# Patient Record
Sex: Male | Born: 1950
Health system: Southern US, Community
[De-identification: ages and names within clinical notes are randomized; demographics above are authoritative.]

## PROBLEM LIST (undated history)

## (undated) ENCOUNTER — Ambulatory Visit: Admission: EM | Payer: Medicare Other

## (undated) DIAGNOSIS — T7840XA Allergy, unspecified, initial encounter: Secondary | ICD-10-CM

## (undated) DIAGNOSIS — K219 Gastro-esophageal reflux disease without esophagitis: Secondary | ICD-10-CM

## (undated) DIAGNOSIS — I10 Essential (primary) hypertension: Secondary | ICD-10-CM

## (undated) DIAGNOSIS — M51369 Other intervertebral disc degeneration, lumbar region without mention of lumbar back pain or lower extremity pain: Secondary | ICD-10-CM

## (undated) DIAGNOSIS — M5136 Other intervertebral disc degeneration, lumbar region: Secondary | ICD-10-CM

## (undated) DIAGNOSIS — I82409 Acute embolism and thrombosis of unspecified deep veins of unspecified lower extremity: Secondary | ICD-10-CM

## (undated) DIAGNOSIS — I7300 Raynaud's syndrome without gangrene: Secondary | ICD-10-CM

## (undated) DIAGNOSIS — E785 Hyperlipidemia, unspecified: Secondary | ICD-10-CM

## (undated) HISTORY — DX: Acute embolism and thrombosis of unspecified deep veins of unspecified lower extremity: I82.409

## (undated) HISTORY — DX: Gastro-esophageal reflux disease without esophagitis: K21.9

## (undated) HISTORY — DX: Allergy, unspecified, initial encounter: T78.40XA

## (undated) HISTORY — PX: EYE SURGERY: SHX253

## (undated) HISTORY — DX: Hyperlipidemia, unspecified: E78.5

## (undated) HISTORY — DX: Essential (primary) hypertension: I10

---

## 2016-10-11 DIAGNOSIS — J069 Acute upper respiratory infection, unspecified: Secondary | ICD-10-CM | POA: Diagnosis not present

## 2016-10-11 DIAGNOSIS — I10 Essential (primary) hypertension: Secondary | ICD-10-CM | POA: Diagnosis not present

## 2016-10-11 DIAGNOSIS — Z6825 Body mass index (BMI) 25.0-25.9, adult: Secondary | ICD-10-CM | POA: Diagnosis not present

## 2016-10-11 DIAGNOSIS — Z713 Dietary counseling and surveillance: Secondary | ICD-10-CM | POA: Diagnosis not present

## 2017-01-23 DIAGNOSIS — Z1211 Encounter for screening for malignant neoplasm of colon: Secondary | ICD-10-CM | POA: Diagnosis not present

## 2017-01-23 DIAGNOSIS — Z23 Encounter for immunization: Secondary | ICD-10-CM | POA: Diagnosis not present

## 2017-01-23 DIAGNOSIS — M5136 Other intervertebral disc degeneration, lumbar region: Secondary | ICD-10-CM | POA: Diagnosis not present

## 2017-01-23 DIAGNOSIS — I10 Essential (primary) hypertension: Secondary | ICD-10-CM | POA: Diagnosis not present

## 2017-01-23 DIAGNOSIS — Z13220 Encounter for screening for lipoid disorders: Secondary | ICD-10-CM | POA: Diagnosis not present

## 2017-01-30 DIAGNOSIS — Z1211 Encounter for screening for malignant neoplasm of colon: Secondary | ICD-10-CM | POA: Diagnosis not present

## 2017-09-03 DIAGNOSIS — Z23 Encounter for immunization: Secondary | ICD-10-CM | POA: Diagnosis not present

## 2017-11-14 DIAGNOSIS — L989 Disorder of the skin and subcutaneous tissue, unspecified: Secondary | ICD-10-CM | POA: Diagnosis not present

## 2017-11-14 DIAGNOSIS — I10 Essential (primary) hypertension: Secondary | ICD-10-CM | POA: Diagnosis not present

## 2017-12-03 DIAGNOSIS — Z961 Presence of intraocular lens: Secondary | ICD-10-CM | POA: Diagnosis not present

## 2017-12-03 DIAGNOSIS — H26492 Other secondary cataract, left eye: Secondary | ICD-10-CM | POA: Diagnosis not present

## 2017-12-03 DIAGNOSIS — H35362 Drusen (degenerative) of macula, left eye: Secondary | ICD-10-CM | POA: Diagnosis not present

## 2017-12-03 DIAGNOSIS — H35033 Hypertensive retinopathy, bilateral: Secondary | ICD-10-CM | POA: Diagnosis not present

## 2017-12-03 DIAGNOSIS — H26491 Other secondary cataract, right eye: Secondary | ICD-10-CM | POA: Diagnosis not present

## 2018-01-02 DIAGNOSIS — Z85828 Personal history of other malignant neoplasm of skin: Secondary | ICD-10-CM | POA: Diagnosis not present

## 2018-01-02 DIAGNOSIS — C44329 Squamous cell carcinoma of skin of other parts of face: Secondary | ICD-10-CM | POA: Diagnosis not present

## 2018-01-02 DIAGNOSIS — L570 Actinic keratosis: Secondary | ICD-10-CM | POA: Diagnosis not present

## 2018-01-02 DIAGNOSIS — L739 Follicular disorder, unspecified: Secondary | ICD-10-CM | POA: Diagnosis not present

## 2018-01-02 DIAGNOSIS — D485 Neoplasm of uncertain behavior of skin: Secondary | ICD-10-CM | POA: Diagnosis not present

## 2018-01-02 DIAGNOSIS — L821 Other seborrheic keratosis: Secondary | ICD-10-CM | POA: Diagnosis not present

## 2018-02-08 DIAGNOSIS — L570 Actinic keratosis: Secondary | ICD-10-CM | POA: Diagnosis not present

## 2018-02-08 DIAGNOSIS — Z85828 Personal history of other malignant neoplasm of skin: Secondary | ICD-10-CM | POA: Diagnosis not present

## 2018-05-22 DIAGNOSIS — Z0000 Encounter for general adult medical examination without abnormal findings: Secondary | ICD-10-CM | POA: Diagnosis not present

## 2018-05-22 DIAGNOSIS — Z23 Encounter for immunization: Secondary | ICD-10-CM | POA: Diagnosis not present

## 2018-05-22 DIAGNOSIS — I10 Essential (primary) hypertension: Secondary | ICD-10-CM | POA: Diagnosis not present

## 2018-05-22 DIAGNOSIS — E782 Mixed hyperlipidemia: Secondary | ICD-10-CM | POA: Diagnosis not present

## 2018-05-22 DIAGNOSIS — Z1211 Encounter for screening for malignant neoplasm of colon: Secondary | ICD-10-CM | POA: Diagnosis not present

## 2018-05-22 DIAGNOSIS — R945 Abnormal results of liver function studies: Secondary | ICD-10-CM | POA: Diagnosis not present

## 2018-05-28 DIAGNOSIS — Z1211 Encounter for screening for malignant neoplasm of colon: Secondary | ICD-10-CM | POA: Diagnosis not present

## 2018-07-05 DIAGNOSIS — C44319 Basal cell carcinoma of skin of other parts of face: Secondary | ICD-10-CM | POA: Diagnosis not present

## 2018-07-05 DIAGNOSIS — L821 Other seborrheic keratosis: Secondary | ICD-10-CM | POA: Diagnosis not present

## 2018-07-05 DIAGNOSIS — Z85828 Personal history of other malignant neoplasm of skin: Secondary | ICD-10-CM | POA: Diagnosis not present

## 2018-07-05 DIAGNOSIS — D2271 Melanocytic nevi of right lower limb, including hip: Secondary | ICD-10-CM | POA: Diagnosis not present

## 2018-07-05 DIAGNOSIS — D1801 Hemangioma of skin and subcutaneous tissue: Secondary | ICD-10-CM | POA: Diagnosis not present

## 2018-07-05 DIAGNOSIS — D225 Melanocytic nevi of trunk: Secondary | ICD-10-CM | POA: Diagnosis not present

## 2018-07-05 DIAGNOSIS — L570 Actinic keratosis: Secondary | ICD-10-CM | POA: Diagnosis not present

## 2018-08-13 DIAGNOSIS — C44319 Basal cell carcinoma of skin of other parts of face: Secondary | ICD-10-CM | POA: Diagnosis not present

## 2018-08-13 DIAGNOSIS — Z85828 Personal history of other malignant neoplasm of skin: Secondary | ICD-10-CM | POA: Diagnosis not present

## 2018-09-18 DIAGNOSIS — J069 Acute upper respiratory infection, unspecified: Secondary | ICD-10-CM | POA: Diagnosis not present

## 2018-09-18 DIAGNOSIS — R05 Cough: Secondary | ICD-10-CM | POA: Diagnosis not present

## 2018-12-09 DIAGNOSIS — Z961 Presence of intraocular lens: Secondary | ICD-10-CM | POA: Diagnosis not present

## 2018-12-09 DIAGNOSIS — H26492 Other secondary cataract, left eye: Secondary | ICD-10-CM | POA: Diagnosis not present

## 2018-12-09 DIAGNOSIS — H35033 Hypertensive retinopathy, bilateral: Secondary | ICD-10-CM | POA: Diagnosis not present

## 2018-12-09 DIAGNOSIS — H26491 Other secondary cataract, right eye: Secondary | ICD-10-CM | POA: Diagnosis not present

## 2018-12-30 DIAGNOSIS — H26492 Other secondary cataract, left eye: Secondary | ICD-10-CM | POA: Diagnosis not present

## 2019-01-10 DIAGNOSIS — Z85828 Personal history of other malignant neoplasm of skin: Secondary | ICD-10-CM | POA: Diagnosis not present

## 2019-01-10 DIAGNOSIS — D225 Melanocytic nevi of trunk: Secondary | ICD-10-CM | POA: Diagnosis not present

## 2019-01-10 DIAGNOSIS — L814 Other melanin hyperpigmentation: Secondary | ICD-10-CM | POA: Diagnosis not present

## 2019-01-10 DIAGNOSIS — L570 Actinic keratosis: Secondary | ICD-10-CM | POA: Diagnosis not present

## 2019-01-10 DIAGNOSIS — D1801 Hemangioma of skin and subcutaneous tissue: Secondary | ICD-10-CM | POA: Diagnosis not present

## 2019-01-10 DIAGNOSIS — L821 Other seborrheic keratosis: Secondary | ICD-10-CM | POA: Diagnosis not present

## 2019-01-10 DIAGNOSIS — C44619 Basal cell carcinoma of skin of left upper limb, including shoulder: Secondary | ICD-10-CM | POA: Diagnosis not present

## 2019-01-13 DIAGNOSIS — H26491 Other secondary cataract, right eye: Secondary | ICD-10-CM | POA: Diagnosis not present

## 2019-01-14 DIAGNOSIS — J069 Acute upper respiratory infection, unspecified: Secondary | ICD-10-CM | POA: Diagnosis not present

## 2019-01-14 DIAGNOSIS — W19XXXA Unspecified fall, initial encounter: Secondary | ICD-10-CM | POA: Diagnosis not present

## 2019-01-14 DIAGNOSIS — S20211A Contusion of right front wall of thorax, initial encounter: Secondary | ICD-10-CM | POA: Diagnosis not present

## 2019-07-10 DIAGNOSIS — D225 Melanocytic nevi of trunk: Secondary | ICD-10-CM | POA: Diagnosis not present

## 2019-07-10 DIAGNOSIS — L814 Other melanin hyperpigmentation: Secondary | ICD-10-CM | POA: Diagnosis not present

## 2019-07-10 DIAGNOSIS — L821 Other seborrheic keratosis: Secondary | ICD-10-CM | POA: Diagnosis not present

## 2019-07-10 DIAGNOSIS — L570 Actinic keratosis: Secondary | ICD-10-CM | POA: Diagnosis not present

## 2019-07-10 DIAGNOSIS — Z85828 Personal history of other malignant neoplasm of skin: Secondary | ICD-10-CM | POA: Diagnosis not present

## 2019-07-10 DIAGNOSIS — D1801 Hemangioma of skin and subcutaneous tissue: Secondary | ICD-10-CM | POA: Diagnosis not present

## 2019-07-17 DIAGNOSIS — Z961 Presence of intraocular lens: Secondary | ICD-10-CM | POA: Diagnosis not present

## 2019-07-17 DIAGNOSIS — H04123 Dry eye syndrome of bilateral lacrimal glands: Secondary | ICD-10-CM | POA: Diagnosis not present

## 2019-07-17 DIAGNOSIS — H35033 Hypertensive retinopathy, bilateral: Secondary | ICD-10-CM | POA: Diagnosis not present

## 2019-07-17 DIAGNOSIS — H35362 Drusen (degenerative) of macula, left eye: Secondary | ICD-10-CM | POA: Diagnosis not present

## 2019-07-17 DIAGNOSIS — H43811 Vitreous degeneration, right eye: Secondary | ICD-10-CM | POA: Diagnosis not present

## 2019-08-08 DIAGNOSIS — H35033 Hypertensive retinopathy, bilateral: Secondary | ICD-10-CM | POA: Diagnosis not present

## 2019-08-08 DIAGNOSIS — I10 Essential (primary) hypertension: Secondary | ICD-10-CM | POA: Diagnosis not present

## 2019-08-08 DIAGNOSIS — E782 Mixed hyperlipidemia: Secondary | ICD-10-CM | POA: Diagnosis not present

## 2019-08-18 DIAGNOSIS — I10 Essential (primary) hypertension: Secondary | ICD-10-CM | POA: Diagnosis not present

## 2019-08-18 DIAGNOSIS — Z23 Encounter for immunization: Secondary | ICD-10-CM | POA: Diagnosis not present

## 2019-08-18 DIAGNOSIS — E782 Mixed hyperlipidemia: Secondary | ICD-10-CM | POA: Diagnosis not present

## 2019-12-21 ENCOUNTER — Ambulatory Visit: Payer: Self-pay

## 2020-01-09 ENCOUNTER — Ambulatory Visit: Payer: Self-pay

## 2020-01-13 DIAGNOSIS — Z85828 Personal history of other malignant neoplasm of skin: Secondary | ICD-10-CM | POA: Diagnosis not present

## 2020-01-13 DIAGNOSIS — D225 Melanocytic nevi of trunk: Secondary | ICD-10-CM | POA: Diagnosis not present

## 2020-01-13 DIAGNOSIS — D2261 Melanocytic nevi of right upper limb, including shoulder: Secondary | ICD-10-CM | POA: Diagnosis not present

## 2020-01-13 DIAGNOSIS — C44319 Basal cell carcinoma of skin of other parts of face: Secondary | ICD-10-CM | POA: Diagnosis not present

## 2020-01-13 DIAGNOSIS — L814 Other melanin hyperpigmentation: Secondary | ICD-10-CM | POA: Diagnosis not present

## 2020-01-13 DIAGNOSIS — D1801 Hemangioma of skin and subcutaneous tissue: Secondary | ICD-10-CM | POA: Diagnosis not present

## 2020-01-13 DIAGNOSIS — L570 Actinic keratosis: Secondary | ICD-10-CM | POA: Diagnosis not present

## 2020-01-13 DIAGNOSIS — D0439 Carcinoma in situ of skin of other parts of face: Secondary | ICD-10-CM | POA: Diagnosis not present

## 2020-01-13 DIAGNOSIS — L821 Other seborrheic keratosis: Secondary | ICD-10-CM | POA: Diagnosis not present

## 2020-07-02 DIAGNOSIS — R630 Anorexia: Secondary | ICD-10-CM | POA: Diagnosis not present

## 2020-07-02 DIAGNOSIS — R5383 Other fatigue: Secondary | ICD-10-CM | POA: Diagnosis not present

## 2020-07-02 DIAGNOSIS — M545 Low back pain: Secondary | ICD-10-CM | POA: Diagnosis not present

## 2020-07-02 DIAGNOSIS — I10 Essential (primary) hypertension: Secondary | ICD-10-CM | POA: Diagnosis not present

## 2020-07-02 DIAGNOSIS — G8929 Other chronic pain: Secondary | ICD-10-CM | POA: Diagnosis not present

## 2020-07-02 DIAGNOSIS — N400 Enlarged prostate without lower urinary tract symptoms: Secondary | ICD-10-CM | POA: Diagnosis not present

## 2020-07-02 DIAGNOSIS — R6882 Decreased libido: Secondary | ICD-10-CM | POA: Diagnosis not present

## 2020-07-12 DIAGNOSIS — R5383 Other fatigue: Secondary | ICD-10-CM | POA: Diagnosis not present

## 2020-07-12 DIAGNOSIS — N400 Enlarged prostate without lower urinary tract symptoms: Secondary | ICD-10-CM | POA: Diagnosis not present

## 2020-07-12 DIAGNOSIS — R6882 Decreased libido: Secondary | ICD-10-CM | POA: Diagnosis not present

## 2020-07-12 DIAGNOSIS — G8929 Other chronic pain: Secondary | ICD-10-CM | POA: Diagnosis not present

## 2020-07-12 DIAGNOSIS — R630 Anorexia: Secondary | ICD-10-CM | POA: Diagnosis not present

## 2020-07-12 DIAGNOSIS — I10 Essential (primary) hypertension: Secondary | ICD-10-CM | POA: Diagnosis not present

## 2020-07-12 DIAGNOSIS — M545 Low back pain: Secondary | ICD-10-CM | POA: Diagnosis not present

## 2020-07-15 DIAGNOSIS — L814 Other melanin hyperpigmentation: Secondary | ICD-10-CM | POA: Diagnosis not present

## 2020-07-15 DIAGNOSIS — L821 Other seborrheic keratosis: Secondary | ICD-10-CM | POA: Diagnosis not present

## 2020-07-15 DIAGNOSIS — D225 Melanocytic nevi of trunk: Secondary | ICD-10-CM | POA: Diagnosis not present

## 2020-07-15 DIAGNOSIS — L905 Scar conditions and fibrosis of skin: Secondary | ICD-10-CM | POA: Diagnosis not present

## 2020-07-15 DIAGNOSIS — D1801 Hemangioma of skin and subcutaneous tissue: Secondary | ICD-10-CM | POA: Diagnosis not present

## 2020-07-15 DIAGNOSIS — L570 Actinic keratosis: Secondary | ICD-10-CM | POA: Diagnosis not present

## 2020-07-15 DIAGNOSIS — Z85828 Personal history of other malignant neoplasm of skin: Secondary | ICD-10-CM | POA: Diagnosis not present

## 2020-07-15 DIAGNOSIS — C44519 Basal cell carcinoma of skin of other part of trunk: Secondary | ICD-10-CM | POA: Diagnosis not present

## 2020-07-26 DIAGNOSIS — H35033 Hypertensive retinopathy, bilateral: Secondary | ICD-10-CM | POA: Diagnosis not present

## 2020-07-26 DIAGNOSIS — Z961 Presence of intraocular lens: Secondary | ICD-10-CM | POA: Diagnosis not present

## 2020-07-26 DIAGNOSIS — H35362 Drusen (degenerative) of macula, left eye: Secondary | ICD-10-CM | POA: Diagnosis not present

## 2020-07-26 DIAGNOSIS — H11823 Conjunctivochalasis, bilateral: Secondary | ICD-10-CM | POA: Diagnosis not present

## 2020-07-26 DIAGNOSIS — H04123 Dry eye syndrome of bilateral lacrimal glands: Secondary | ICD-10-CM | POA: Diagnosis not present

## 2020-07-27 ENCOUNTER — Other Ambulatory Visit: Payer: Self-pay

## 2020-07-27 ENCOUNTER — Ambulatory Visit: Payer: Medicare Other | Attending: Family Medicine

## 2020-07-27 DIAGNOSIS — M545 Low back pain: Secondary | ICD-10-CM | POA: Diagnosis not present

## 2020-07-27 DIAGNOSIS — M6281 Muscle weakness (generalized): Secondary | ICD-10-CM | POA: Diagnosis not present

## 2020-07-27 DIAGNOSIS — R293 Abnormal posture: Secondary | ICD-10-CM | POA: Diagnosis not present

## 2020-07-27 DIAGNOSIS — M5136 Other intervertebral disc degeneration, lumbar region: Secondary | ICD-10-CM | POA: Diagnosis not present

## 2020-07-27 DIAGNOSIS — G8929 Other chronic pain: Secondary | ICD-10-CM | POA: Insufficient documentation

## 2020-07-27 NOTE — Therapy (Signed)
[address] [place], [place], [address] Phone: [phone]   Fax:  [phone]  Physical Therapy Evaluation  Patient Details  Name: Raymond Dismuke Sr. MRN: [id number] Date of Birth: 13-Mar-1951 Referring Provider (PT): [place] Lions, FNP   Encounter Date: 07/27/2020   PT End of Session - 07/27/20 1748    Visit Number 1    Number of Visits 12    Date for PT Re-Evaluation 09/07/20    Authorization Type Medicare    Progress Note Due on Visit 10    PT Start Time 1532    PT Stop Time 1617    PT Time Calculation (min) 45 min    Activity Tolerance Patient tolerated treatment well    Behavior During Therapy [facility] for tasks assessed/performed           History reviewed. No pertinent past medical history.  History reviewed. No pertinent surgical history.  There were no vitals filed for this visit.    Subjective Assessment - 07/27/20 1536    Subjective Pt reports he has had low back pain since he was 36 or 37. He was diagnosed with DDD in [place] and was referred to PT at the time. 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 He notices when he is playing tennis and after, as well as picking up stuff he has pain in low back like a band across his back. "It is always tight though." He was recently stuck in traffic for about 90 minutes and started having pains going down at least his left leg posteriorly all the way down.    Pertinent History DDD    Limitations Other (comment);Walking;Lifting   walking downhill   How long can you sit comfortably? as long as he wants    How long can you stand comfortably? a few minutes    How long can you walk comfortably? hardly ever bothers him    Diagnostic tests XR - curvature of spine, multiple level degenerative disc disease    Patient Stated Goals Get stronger, know what exercises to do    Currently in Pain? Yes     Pain Score 4    gets up to a 7/10   Pain Location Back    Pain Orientation Lower    Pain Descriptors / Indicators Other (Comment)   stiffness   Pain Type Chronic pain    Pain Radiating Towards occasionally posterior left leg    Pain Onset More than a month ago    Pain Frequency Intermittent    Aggravating Factors  twisting, extending, prolonged bending    Pain Relieving Factors Doterra Deep Blue, Aleve, heating pad, swimming              [facility] PT Assessment - 07/27/20 0001      Assessment   Medical Diagnosis Low Back Pain    Referring Provider (PT) [place] Lions, FNP    Onset Date/Surgical Date --   unknown   Hand Dominance Left    Next MD Visit ~1 month    Prior Therapy Yes      Balance Screen   Has the patient fallen in the past 6 months Yes    How many times? 2   on tennis court, changing direction   Has the patient had a decrease in activity level because of a fear of falling?  Yes  Is the patient reluctant to leave their home because of a fear of falling?  No      Prior Function   Level of Independence Independent    Vocation Retired    Teacher, music some tennis lessons    Leisure Tennis 3-4x/week, yard work, hiking      ROM / Strength   AROM / PROM / Strength AROM;Strength      AROM   Overall AROM Comments limited by 25-50% all motions with pain    AROM Assessment Site Lumbar      Strength   Strength Assessment Site Hip;Knee    Right/Left Hip Right;Left    Right Hip Flexion 4/5    Right Hip External Rotation  4-/5    Right Hip ABduction 4-/5    Left Hip Flexion 4+/5    Left Hip External Rotation 4-/5    Left Hip ABduction 4-/5    Right/Left Knee Right;Left    Right Knee Flexion 4/5    Right Knee Extension 4/5    Left Knee Flexion 4+/5    Left Knee Extension 4+/5      Special Tests    Special Tests Lumbar    Lumbar Tests Slump Test      Slump test   Findings Negative                      Objective measurements  completed on examination: See above findings.               PT Education - 07/27/20 1748    Education Details POC, HEP, diagnosis, prognosis    Person(s) Educated Patient    Methods Explanation;Demonstration;Tactile cues;Verbal cues;Handout    Comprehension Verbal cues required;Returned demonstration;Verbalized understanding;Tactile cues required            PT Short Term Goals - 07/27/20 1752      PT SHORT TERM GOAL #1   Title Pt will be I and compliant with initial HEP.    Time 3    Status New    Target Date 08/17/20             PT Long Term Goals - 07/27/20 1753      PT LONG TERM GOAL #1   Title Pt will be I and compliant with long term HEP for maintenance.    Time 6    Period Weeks    Status New    Target Date 09/07/20      PT LONG TERM GOAL #2   Title Pt will demo pain free lumbar AROM with no pain.    Time 6    Period Weeks    Status New    Target Date 09/07/20      PT LONG TERM GOAL #3   Title Pt will demo R symmetrical to L LE strength.    Time 6    Period Weeks    Status New    Target Date 09/07/20      PT LONG TERM GOAL #4   Title Pt will report ability to stand for longer periods of time, in order to run errands without pain.    Time 6    Period Weeks    Status New    Target Date 09/07/20                  Plan - 07/27/20 1749    Clinical Impression Statement Pt is a 69 yo male who presents with chronic  low back pain and DDD with mobility and strength deficits. Pt demo decreased AROM in all planes with pain greatest into extension and side bending, strength, mild forward flexed posture. Pt was edu on diagnosis, prognosis, posture, POC, and HEP verbalizing understanding and consent to tx. He would benefit from skilled PT 2x/week for 6 weeks to address impairments and reduce pain.    Personal Factors and Comorbidities Age;Past/Current Experience;Time since onset of injury/illness/exacerbation;Profession    Examination-Activity  Limitations Lift;Bend;Stand    Examination-Participation Restrictions Yard Work;Volunteer;Community Activity;Interpersonal Relationship    Stability/Clinical Decision Making Stable/Uncomplicated    Clinical Decision Making Low    Rehab Potential Good    PT Frequency 2x / week    PT Duration 6 weeks    PT Treatment/Interventions ADLs/Self Care Home Management;Traction;Spinal Manipulations;Joint Manipulations;Moist Heat;Iontophoresis 4mg /ml Dexamethasone;Electrical Stimulation;Cryotherapy;Therapeutic activities;Functional mobility training;Therapeutic exercise;Balance training;Neuromuscular re-education;Patient/family education;Manual techniques;Dry needling    PT Next Visit Plan FOTO, Assess response to HEP, add more flexibility and core strengthening    PT Home Exercise Plan see pt instructions    Consulted and Agree with Plan of Care Patient           Patient will benefit from skilled therapeutic intervention in order to improve the following deficits and impairments:  Decreased activity tolerance, Decreased strength, Increased fascial restricitons, Impaired flexibility, Postural dysfunction, Pain, Improper body mechanics, Impaired perceived functional ability, Decreased mobility  Visit Diagnosis: Chronic bilateral low back pain, unspecified whether sciatica present - Plan: PT plan of care cert/re-cert  Degenerative disc disease, lumbar - Plan: PT plan of care cert/re-cert  Abnormal posture - Plan: PT plan of care cert/re-cert  Muscle weakness (generalized) - Plan: PT plan of care cert/re-cert     Problem List There are no problems to display for this patient.   Izell Clymer, PT, DPT 07/27/2020, 5:57 PM  [address] [place], [place], [address] Phone: [phone]   Fax:  [phone]  Name: Raymond Haberman Sr. MRN: [id number] Date of Birth: Mar 29, 1951

## 2020-09-08 DIAGNOSIS — E782 Mixed hyperlipidemia: Secondary | ICD-10-CM | POA: Diagnosis not present

## 2020-09-08 DIAGNOSIS — I10 Essential (primary) hypertension: Secondary | ICD-10-CM | POA: Diagnosis not present

## 2020-09-27 ENCOUNTER — Ambulatory Visit: Payer: Self-pay

## 2020-09-27 ENCOUNTER — Ambulatory Visit (INDEPENDENT_AMBULATORY_CARE_PROVIDER_SITE_OTHER): Payer: Medicare Other | Admitting: Family Medicine

## 2020-09-27 ENCOUNTER — Encounter: Payer: Self-pay | Admitting: Family Medicine

## 2020-09-27 ENCOUNTER — Ambulatory Visit: Payer: Medicare Other

## 2020-09-27 ENCOUNTER — Ambulatory Visit (INDEPENDENT_AMBULATORY_CARE_PROVIDER_SITE_OTHER): Payer: Medicare Other

## 2020-09-27 ENCOUNTER — Other Ambulatory Visit: Payer: Self-pay

## 2020-09-27 VITALS — BP 124/82 | HR 67 | Ht 72.0 in | Wt 202.0 lb

## 2020-09-27 DIAGNOSIS — M5136 Other intervertebral disc degeneration, lumbar region: Secondary | ICD-10-CM | POA: Diagnosis not present

## 2020-09-27 DIAGNOSIS — G8929 Other chronic pain: Secondary | ICD-10-CM

## 2020-09-27 DIAGNOSIS — M545 Low back pain, unspecified: Secondary | ICD-10-CM

## 2020-09-27 DIAGNOSIS — M25562 Pain in left knee: Secondary | ICD-10-CM

## 2020-09-27 DIAGNOSIS — M1712 Unilateral primary osteoarthritis, left knee: Secondary | ICD-10-CM | POA: Insufficient documentation

## 2020-09-27 IMAGING — DX DG KNEE STANDING AP BILAT
1 series · 1 of 1 positions shown · non-contrast
Comparison: None.

CLINICAL DATA: Bilateral knee pain. Acute left knee pain for last 6
weeks.

EXAM:
BILATERAL KNEES STANDING - 1 VIEW

[knee ap]
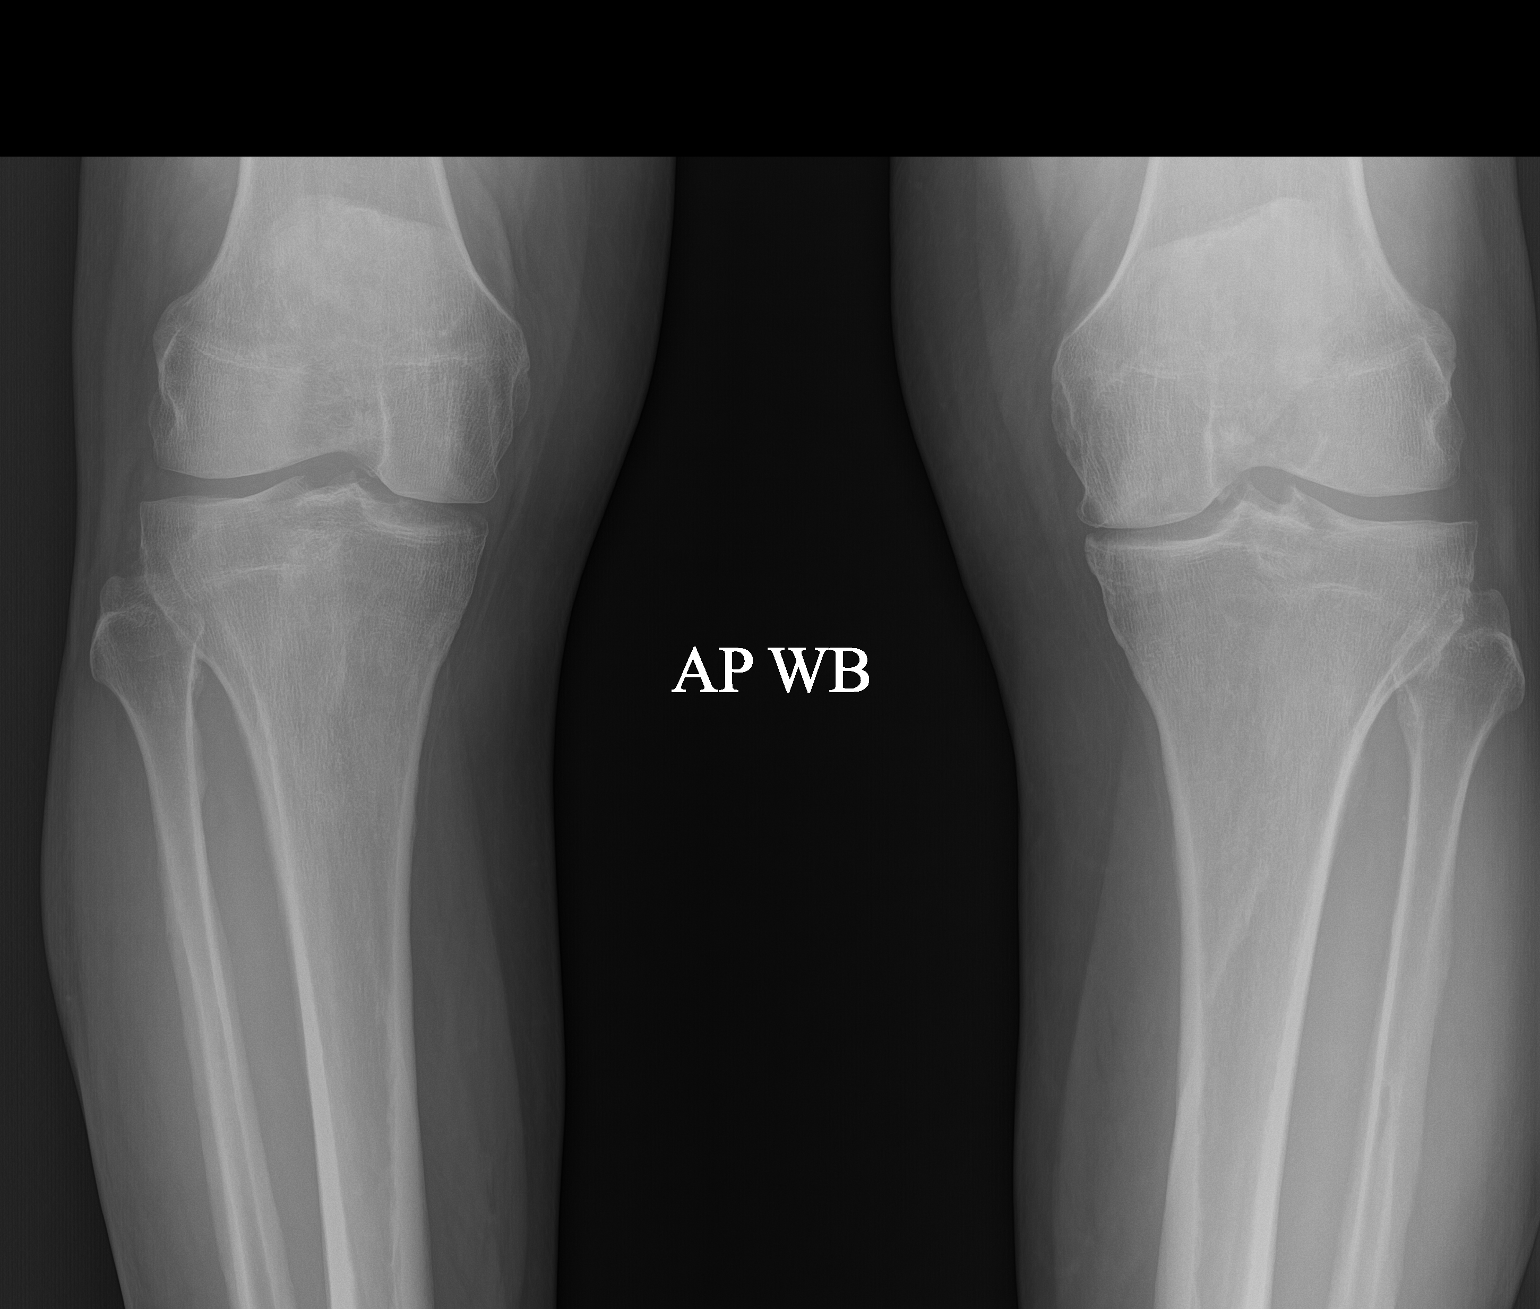

[1 of 1 positions shown; findings below may reference images not displayed]

FINDINGS: AP image of the knees standing demonstrates mild degenerative change
involving the medial compartment of the left knee with some loss of
joint space. No acute or healing fractures are present.
IMPRESSION: Mild degenerative change involving the medial compartment of the
left knee.

## 2020-09-27 IMAGING — DX DG LUMBAR SPINE 2-3V
3 series · 3 of 3 positions shown · non-contrast
Comparison: [DATE]

CLINICAL DATA: Low back pain.  No trauma history submitted.

EXAM:
LUMBAR SPINE - 2-3 VIEW

[l-spine ap]
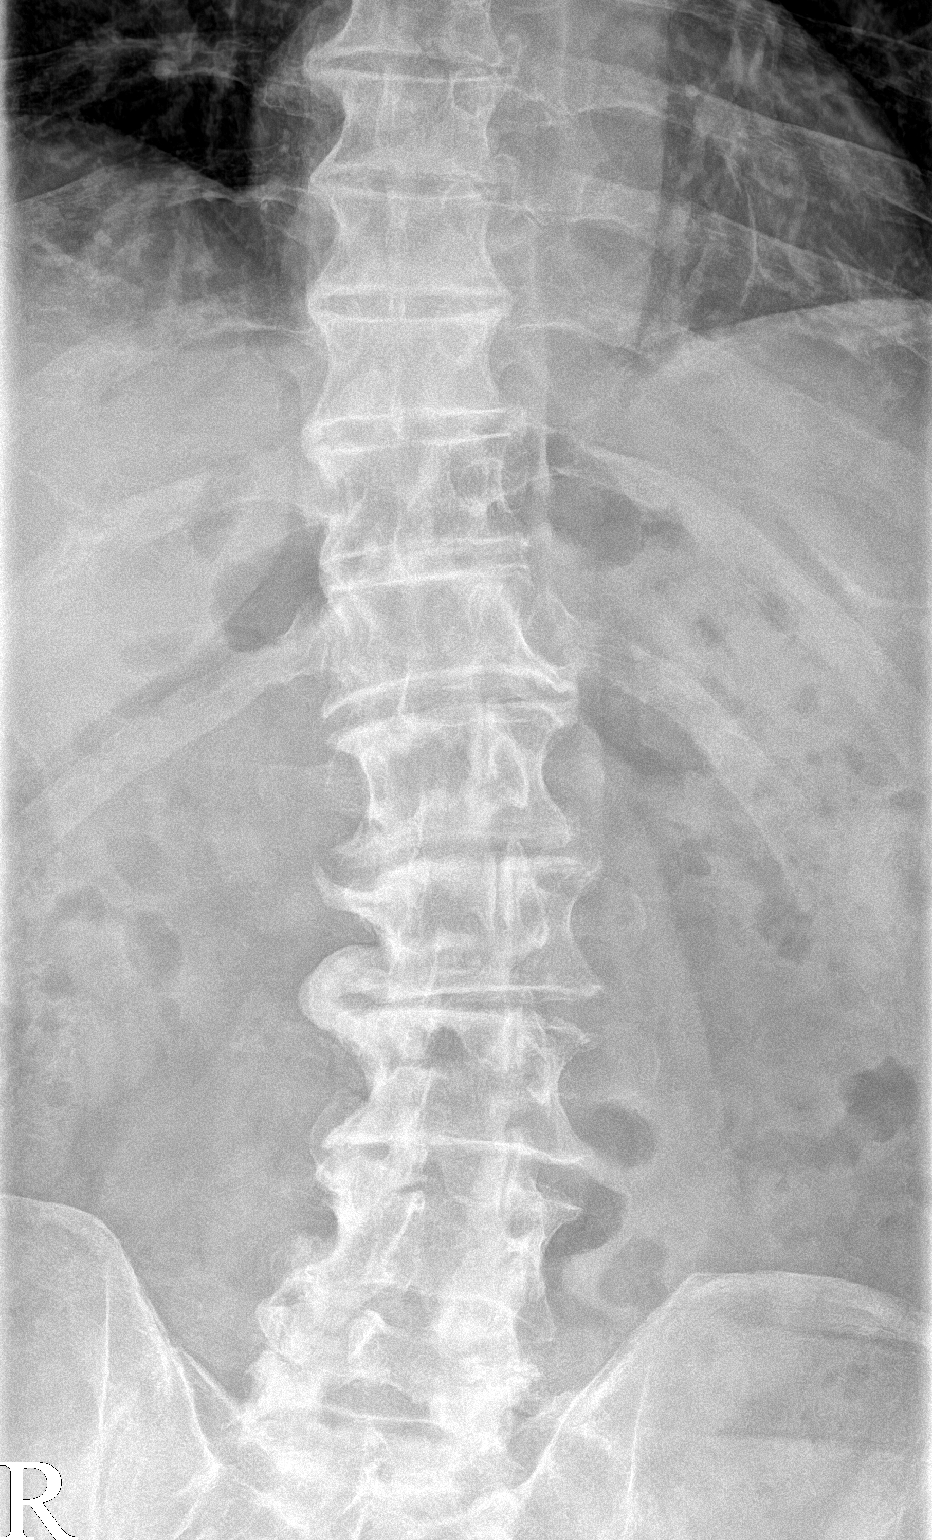

[l-spine lateral (1 of 2)]
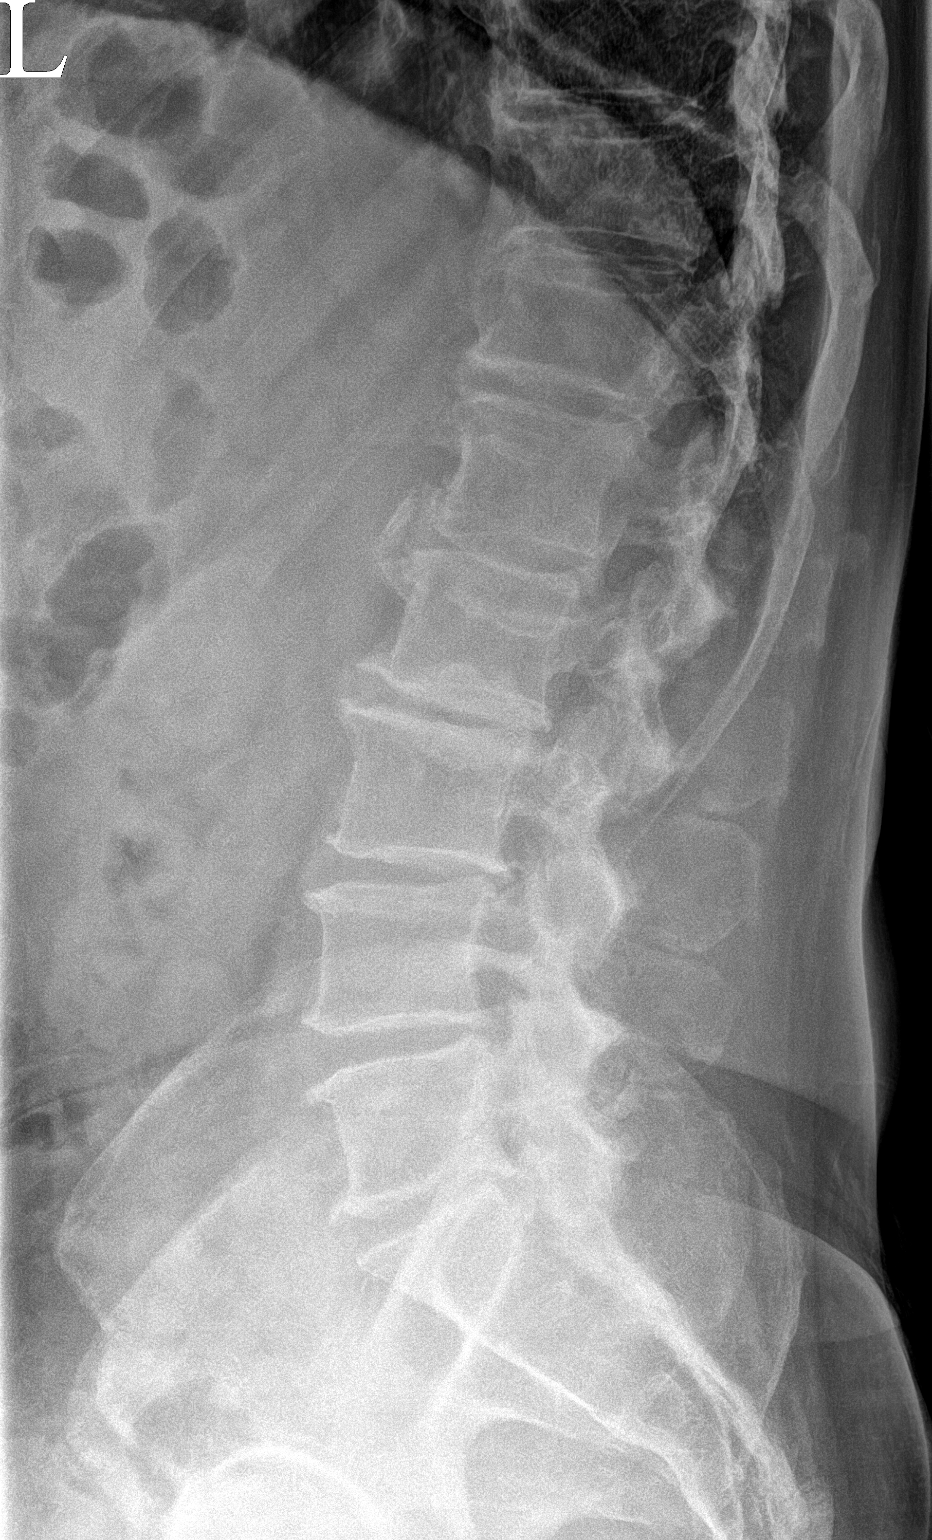

[l-spine lateral (2 of 2)]
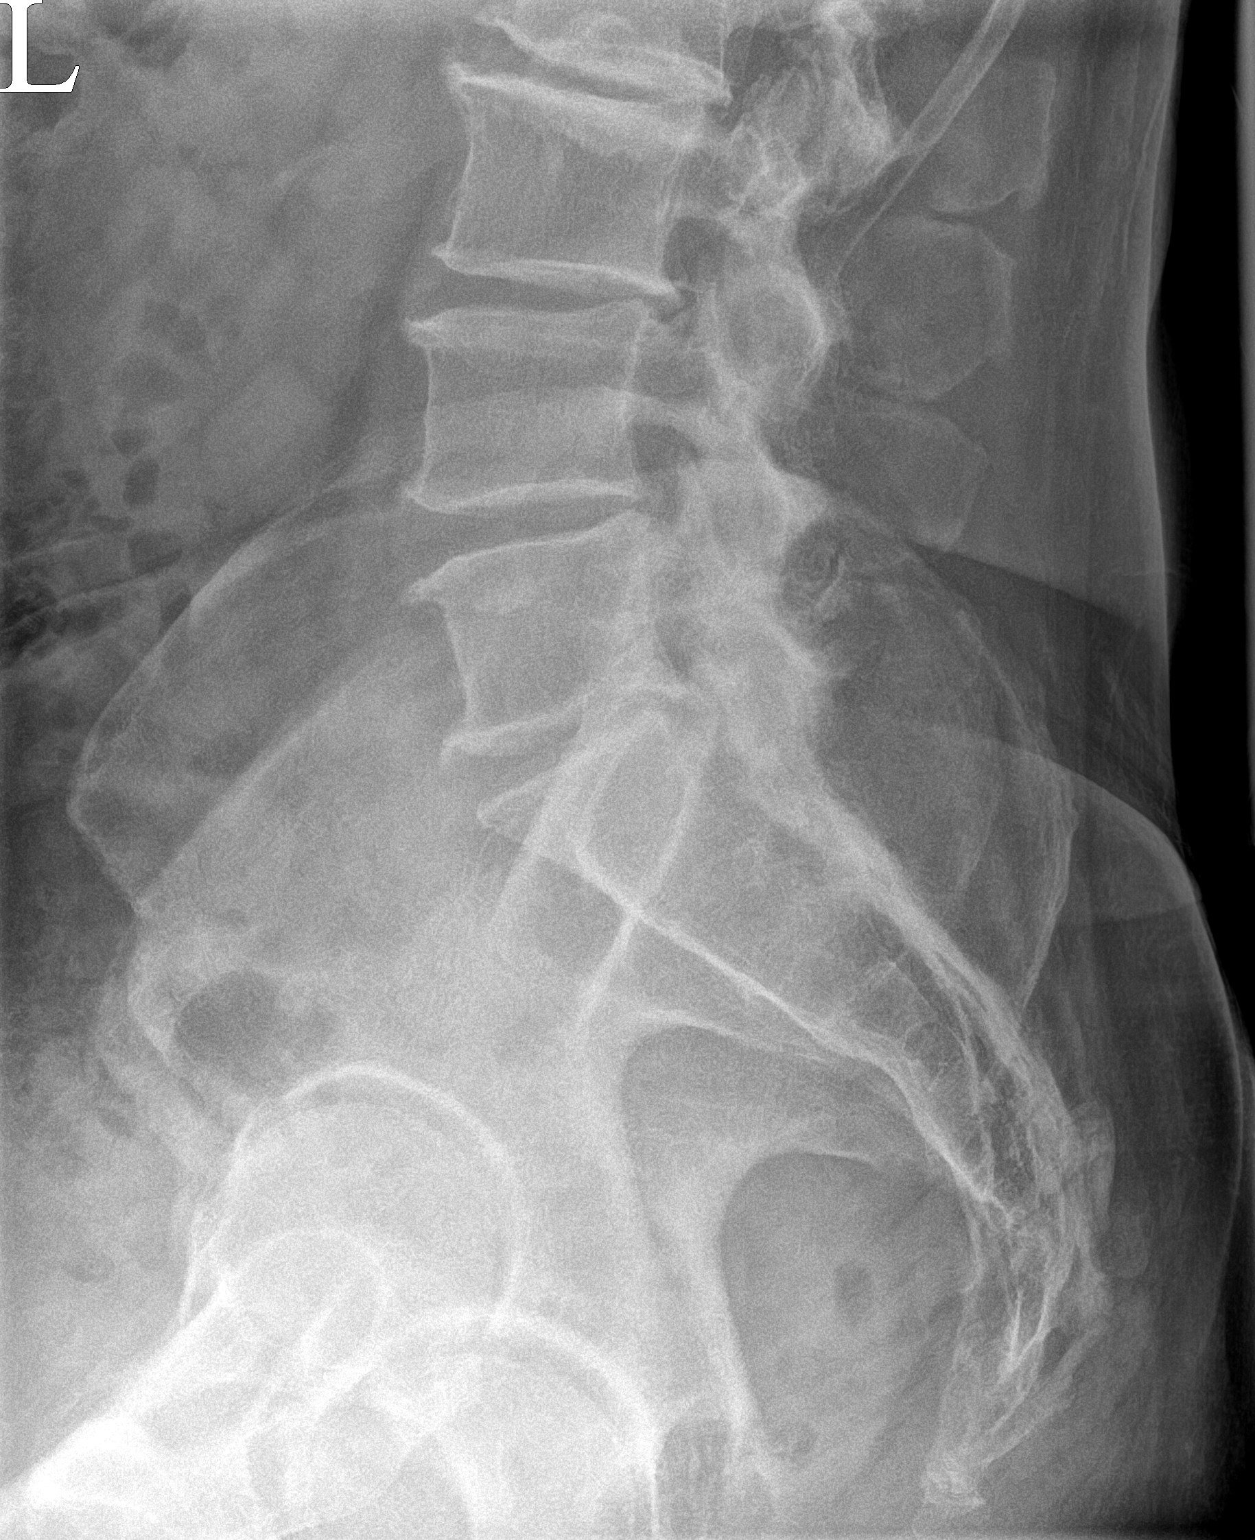

[3 of 3 positions shown; findings below may reference images not displayed]

FINDINGS: Five lumbar type vertebral bodies. Mild convex left lumbar spine
curvature. Maintenance of vertebral body height and alignment. Loss
of intervertebral disc height at L2-3 and L5-S1. Facet arthropathy
at multiple levels throughout the lumbosacral spine.
IMPRESSION: Spondylosis, without acute osseous abnormality.

## 2020-09-27 NOTE — Patient Instructions (Signed)
Xray today Custom knee brace-Ryan will call you Ice 20 minutes 2 times daily. Usually after activity and before bed. Exercises 3 times a week.  Turmeric 500mg  daily  Tart cherry extract 1200mg  at night Vitamin D 2000 IU daily  See me again in 6-8 weeks

## 2020-09-27 NOTE — Assessment & Plan Note (Signed)
Left knee.  Ultrasound shows moderate to severe medial compartment and patellofemoral.  Some instability noted with some mild abnormal thigh to calf ratio.  Due to this I do feel patient would be a candidate for custom brace.  Patient wants to hold on any type of replacement as long as possible.  Held on any type of injection but will consider that at follow-up if we continue to have trouble.  Follow-up again 4 to 8 weeks.

## 2020-09-27 NOTE — Progress Notes (Signed)
[place] Phone: [phone] Subjective:   Raymond Raymond [name], am serving as a scribe for Dr. Hulan Saas. This visit occurred during the SARS-CoV-2 public health emergency.  Safety protocols were in place, including screening questions prior to the visit, additional usage of staff PPE, and extensive cleaning of exam room while observing appropriate contact time as indicated for disinfecting solutions.   I'm seeing this patient by the request  of:  Patient, Raymond [name] Pcp Per  CC: back and knee pain   SLH:[id number]  Raymond Santaana Sr. is a 69 y.o. male coming in with complaint of back and knee pain. Patient states that he has back pain since late 1980s. Patient has been diagnosed with DDD of lumbar spine. Has tried physical therapy. Patient is Passenger transport manager. Pain is present mostly after activity but sometimes during activity. Stiffness is worst in mornings. Does have intermittent radiating pain down the left leg.   Patient is having acute pain in left knee for about 6 weeks. Does not history of arthritis. Pain over lateral aspect. Is able to play tennis and hike despite pain.   History of Osgood Schlatter in right knee.        Raymond [name] past medical history on file. Raymond [name] past surgical history on file. Social History   Socioeconomic History  . Marital status: Married    Spouse name: Not on file  . Number of children: Not on file  . Years of education: Not on file  . Highest education level: Not on file  Occupational History  . Not on file  Tobacco Use  . Smoking status: Not on file  Substance and Sexual Activity  . Alcohol use: Not on file  . Drug use: Not on file  . Sexual activity: Not on file  Other Topics Concern  . Not on file  Social History Narrative  . Not on file   Social Determinants of Health   Financial Resource Strain:   . Difficulty of Paying Living Expenses: Not on file  Food  Insecurity:   . Worried About Charity fundraiser in the Last Year: Not on file  . Ran Out of Food in the Last Year: Not on file  Transportation Needs:   . Lack of Transportation (Medical): Not on file  . Lack of Transportation (Non-Medical): Not on file  Physical Activity:   . Days of Exercise per Week: Not on file  . Minutes of Exercise per Session: Not on file  Stress:   . Feeling of Stress : Not on file  Social Connections:   . Frequency of Communication with Friends and Family: Not on file  . Frequency of Social Gatherings with Friends and Family: Not on file  . Attends Religious Services: Not on file  . Active Member of Clubs or Organizations: Not on file  . Attends Archivist Meetings: Not on file  . Marital Status: Not on file   Not on File Raymond [name] family history on file.   Current Outpatient Medications (Cardiovascular):  .  atorvastatin (LIPITOR) 10 MG tablet, Take 10 mg by mouth daily. .  hydrochlorothiazide (MICROZIDE) 12.5 MG capsule, Take 12.5 mg by mouth daily. Marland Kitchen  losartan (COZAAR) 50 MG tablet, Take 50 mg by mouth daily.     Current Outpatient Medications (Other):  Marland Kitchen  TURMERIC PO, Take by mouth.   Reviewed prior external information including notes and imaging from  primary care provider  As well as notes that were available from care everywhere and other healthcare systems.  Past medical history, social, surgical and family history all reviewed in electronic medical record.  Raymond [name] pertanent information unless stated regarding to the chief complaint.   Review of Systems:  Raymond [name] headache, visual changes, nausea, vomiting, diarrhea, constipation, dizziness, abdominal pain, skin rash, fevers, chills, night sweats, weight loss, swollen lymph nodes, body aches, joint swelling, chest pain, shortness of breath, mood changes. POSITIVE muscle aches  Objective  Blood pressure 124/82, pulse 67, height 6' (1.829 m), weight 202 lb (91.6 kg), SpO2 95 %.   General: Raymond [name]  apparent distress alert and oriented x3 mood and affect normal, dressed appropriately.  HEENT: Pupils equal, extraocular movements intact  Respiratory: Patient's speak in full sentences and does not appear short of breath  Cardiovascular: Raymond [name] lower extremity edema, non tender, Raymond [name] erythema  MSK: Low back exam shows significant loss of lordosis.  Patient does have some very mild degenerative scoliosis noted.  Patient does have significant tightness of the left hip flexor compared to the contralateral side.  Patient does have some limited range of motion of the left hip but worse with external rotation.  Knee: Left valgus deformity noted.  Abnormal thigh to calf ratio.  Tender to palpation over medial and PF joint line.  ROM full in flexion and extension and lower leg rotation. instability with valgus force.  painful patellar compression. Patellar glide with moderate crepitus. Patellar and quadriceps tendons unremarkable. Hamstring and quadriceps strength is normal.  Limited musculoskeletal ultrasound was performed and interpreted by Lyndal Pulley  Limited ultrasound of patient's left knee shows that patient does have some arthritic changes noted mostly with narrowing of the patellofemoral and medial joint space.  Degenerative medial meniscus noted laterally.  Raymond [name] cyst noted.  Trace effusion of the joint noted.   Impression: Knee arthritis with small effusion    [id number]; 15 additional minutes spent for Therapeutic exercises as stated in above notes.  This included exercises focusing on stretching, strengthening, with significant focus on eccentric aspects.   Long term goals include an improvement in range of motion, strength, endurance as well as avoiding reinjury. Patient's frequency would include in 1-2 times a day, 3-5 times a week for a duration of 6-12 weeks. Low back exercises that included:  Pelvic tilt/bracing instruction to focus on control of the pelvic girdle and lower abdominal muscles   Glute strengthening exercises, focusing on proper firing of the glutes without engaging the low back muscles Proper stretching techniques for maximum relief for the hamstrings, hip flexors, low back and some rotation where tolerated   Proper technique shown and discussed handout in great detail with ATC.  All questions were discussed and answered.     Impression and Recommendations:     The above documentation has been reviewed and is accurate and complete Lyndal Pulley, DO

## 2020-09-27 NOTE — Assessment & Plan Note (Signed)
Degenerative disc in the lumbar likely.  We will get x-rays to further evaluate.  I do believe that this is contributing to more of the discomfort and pain.  Patient would like to try a conservative approach.  Discussed over-the-counter medications, home exercises given.  Patient wanted to consider the possibility of formal physical therapy but has done that fairly recently.  Patient will follow up again in 4 to 8 weeks.  If continuing to have trouble I would encourage patient to consider gabapentin and then potentially reevaluation with physical therapy

## 2020-10-23 DIAGNOSIS — Z23 Encounter for immunization: Secondary | ICD-10-CM | POA: Diagnosis not present

## 2020-11-09 ENCOUNTER — Ambulatory Visit: Payer: PRIVATE HEALTH INSURANCE | Admitting: Family Medicine

## 2020-11-15 DIAGNOSIS — N400 Enlarged prostate without lower urinary tract symptoms: Secondary | ICD-10-CM | POA: Diagnosis not present

## 2020-11-15 DIAGNOSIS — I10 Essential (primary) hypertension: Secondary | ICD-10-CM | POA: Diagnosis not present

## 2020-11-15 DIAGNOSIS — H35033 Hypertensive retinopathy, bilateral: Secondary | ICD-10-CM | POA: Diagnosis not present

## 2020-11-15 DIAGNOSIS — E782 Mixed hyperlipidemia: Secondary | ICD-10-CM | POA: Diagnosis not present

## 2020-11-15 DIAGNOSIS — G8929 Other chronic pain: Secondary | ICD-10-CM | POA: Diagnosis not present

## 2020-11-18 ENCOUNTER — Ambulatory Visit: Payer: PRIVATE HEALTH INSURANCE | Admitting: Family Medicine

## 2020-12-03 ENCOUNTER — Encounter: Payer: Self-pay | Admitting: Family Medicine

## 2020-12-03 ENCOUNTER — Ambulatory Visit (INDEPENDENT_AMBULATORY_CARE_PROVIDER_SITE_OTHER): Payer: Medicare Other

## 2020-12-03 ENCOUNTER — Other Ambulatory Visit: Payer: Self-pay

## 2020-12-03 ENCOUNTER — Ambulatory Visit (INDEPENDENT_AMBULATORY_CARE_PROVIDER_SITE_OTHER): Payer: Medicare Other | Admitting: Family Medicine

## 2020-12-03 VITALS — BP 138/86 | HR 77 | Ht 72.0 in | Wt 202.0 lb

## 2020-12-03 DIAGNOSIS — M545 Low back pain, unspecified: Secondary | ICD-10-CM | POA: Diagnosis not present

## 2020-12-03 DIAGNOSIS — M5136 Other intervertebral disc degeneration, lumbar region: Secondary | ICD-10-CM | POA: Diagnosis not present

## 2020-12-03 DIAGNOSIS — M5030 Other cervical disc degeneration, unspecified cervical region: Secondary | ICD-10-CM | POA: Diagnosis not present

## 2020-12-03 DIAGNOSIS — M47814 Spondylosis without myelopathy or radiculopathy, thoracic region: Secondary | ICD-10-CM | POA: Diagnosis not present

## 2020-12-03 DIAGNOSIS — M47812 Spondylosis without myelopathy or radiculopathy, cervical region: Secondary | ICD-10-CM | POA: Diagnosis not present

## 2020-12-03 DIAGNOSIS — M1712 Unilateral primary osteoarthritis, left knee: Secondary | ICD-10-CM

## 2020-12-03 DIAGNOSIS — M542 Cervicalgia: Secondary | ICD-10-CM

## 2020-12-03 DIAGNOSIS — M4802 Spinal stenosis, cervical region: Secondary | ICD-10-CM | POA: Diagnosis not present

## 2020-12-03 DIAGNOSIS — M546 Pain in thoracic spine: Secondary | ICD-10-CM

## 2020-12-03 DIAGNOSIS — M51369 Other intervertebral disc degeneration, lumbar region without mention of lumbar back pain or lower extremity pain: Secondary | ICD-10-CM

## 2020-12-03 IMAGING — DX DG CERVICAL SPINE 2 OR 3 VIEWS
3 series · 3 of 3 positions shown · non-contrast
Comparison: None.

CLINICAL DATA: Cervical spine pain, no known injury, initial
encounter

EXAM:
CERVICAL SPINE - 2-3 VIEW

[c-spine lat]
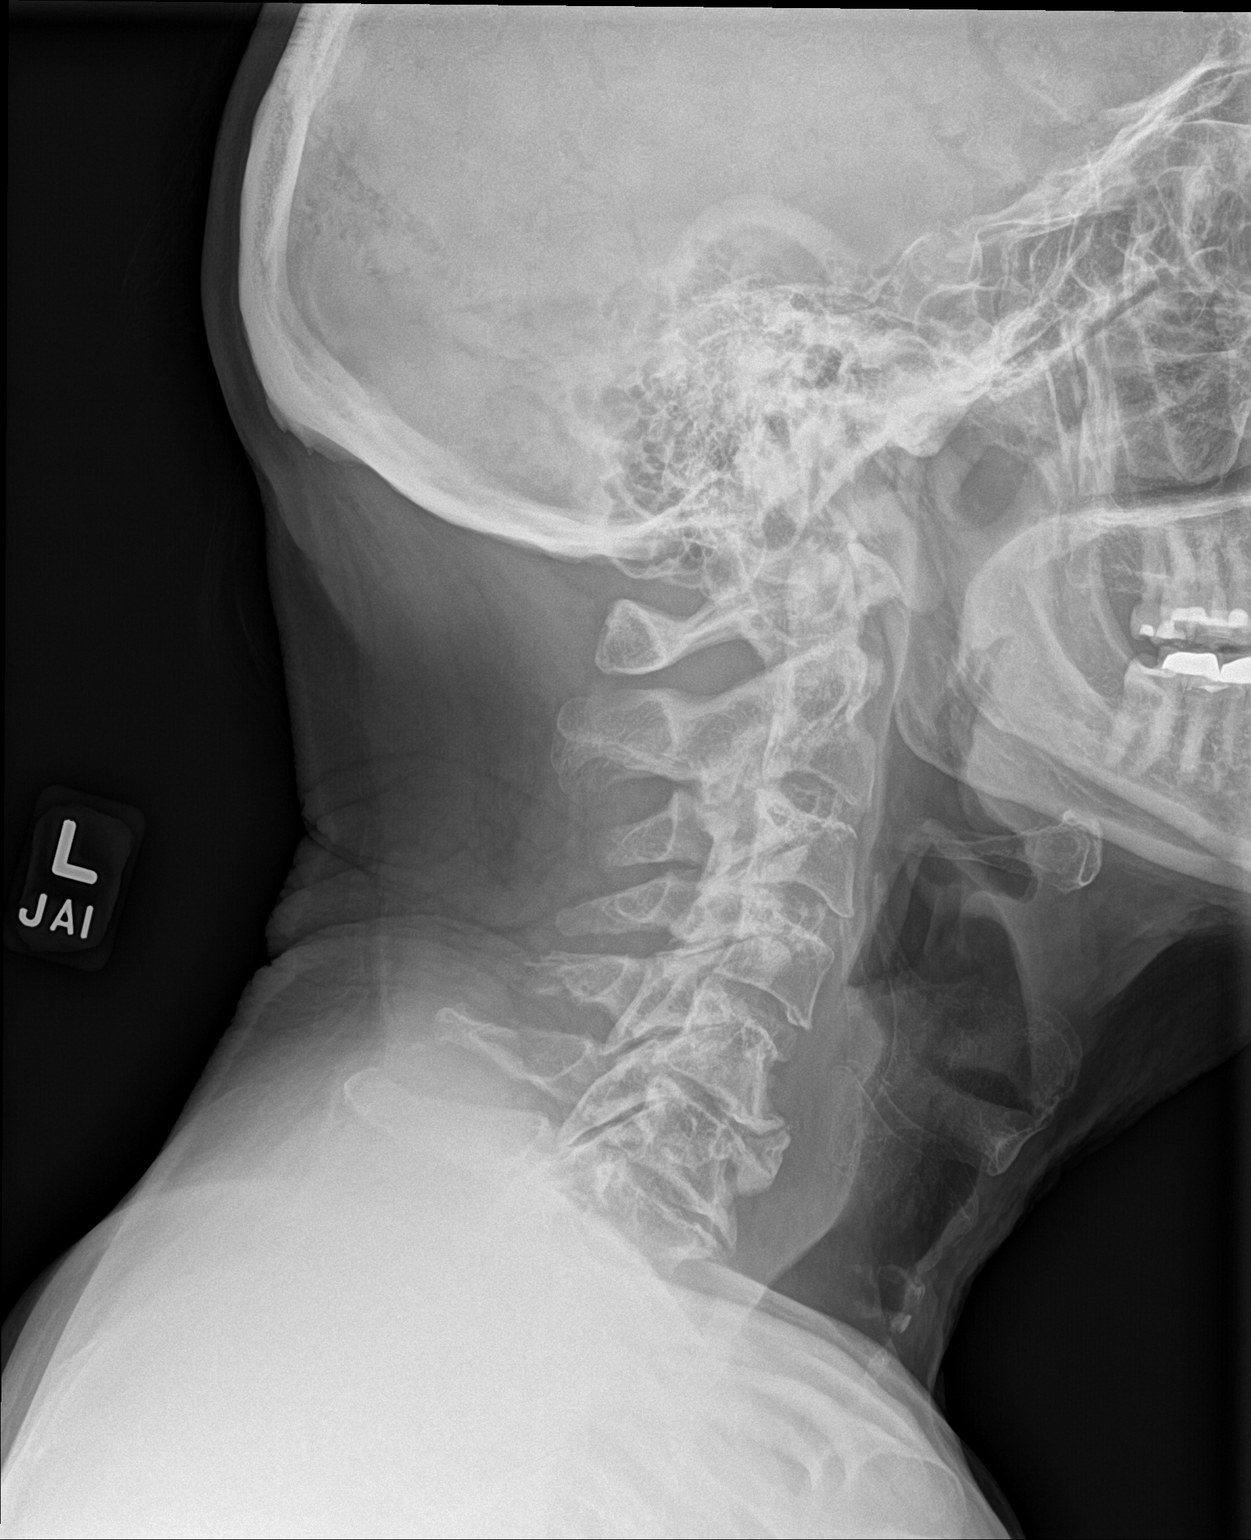

[c-spine ap]
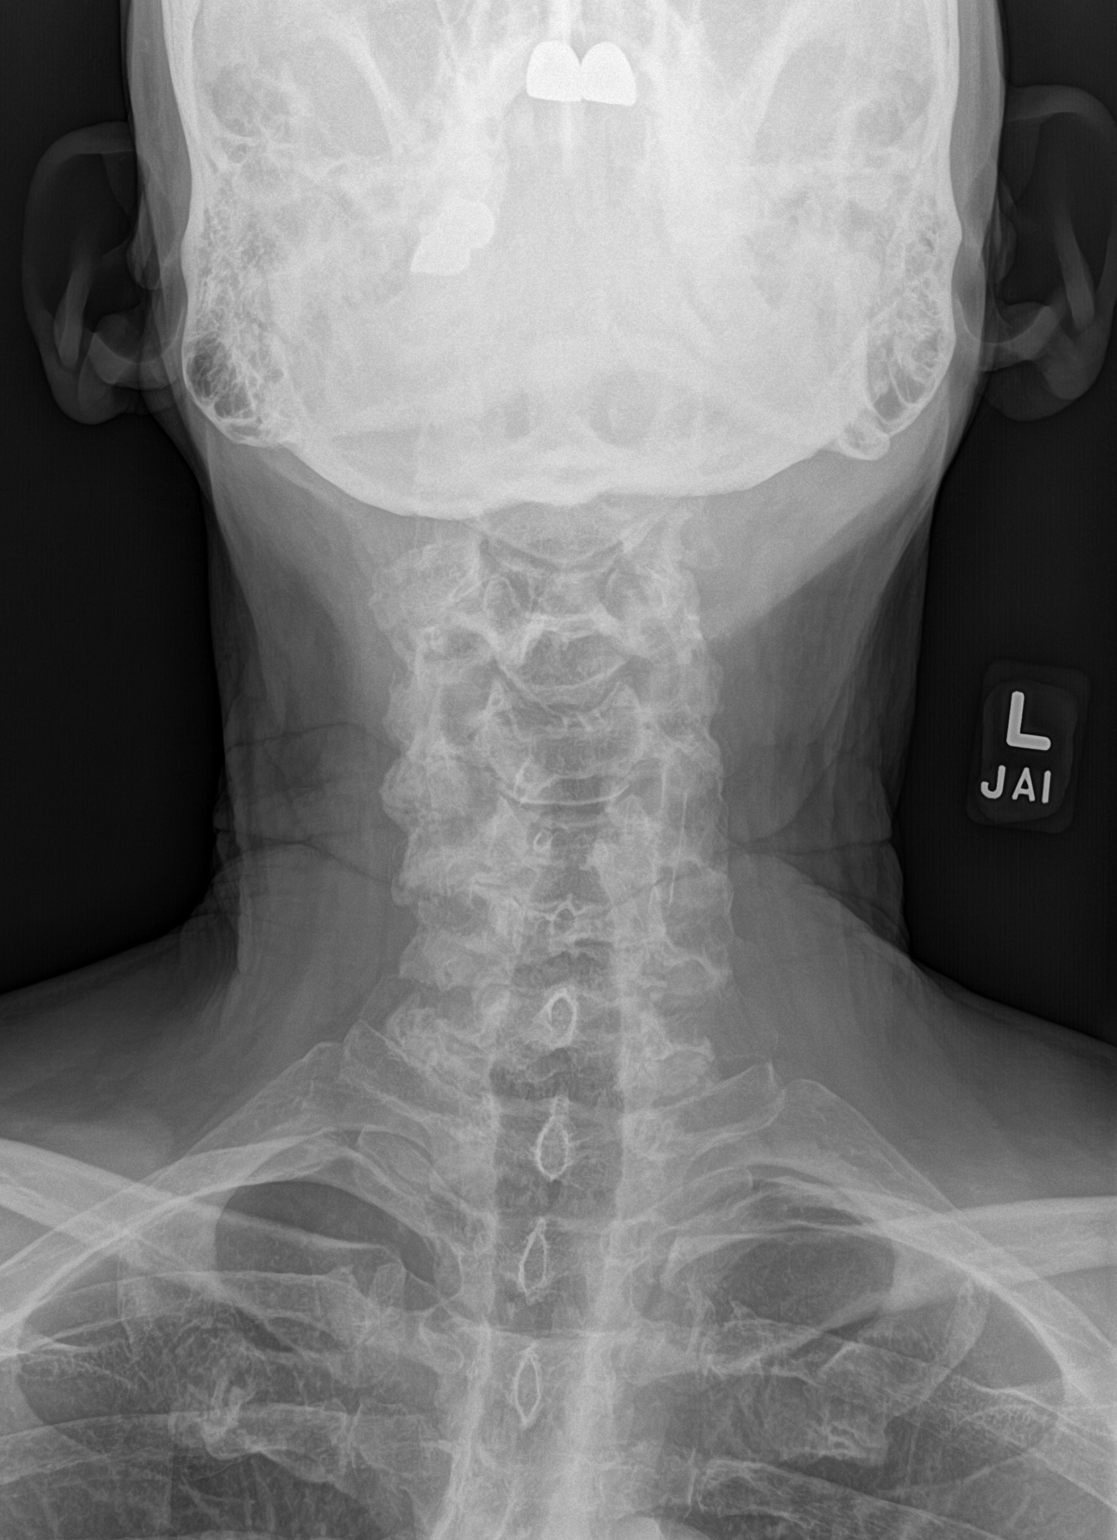

[c-spine open mouth]
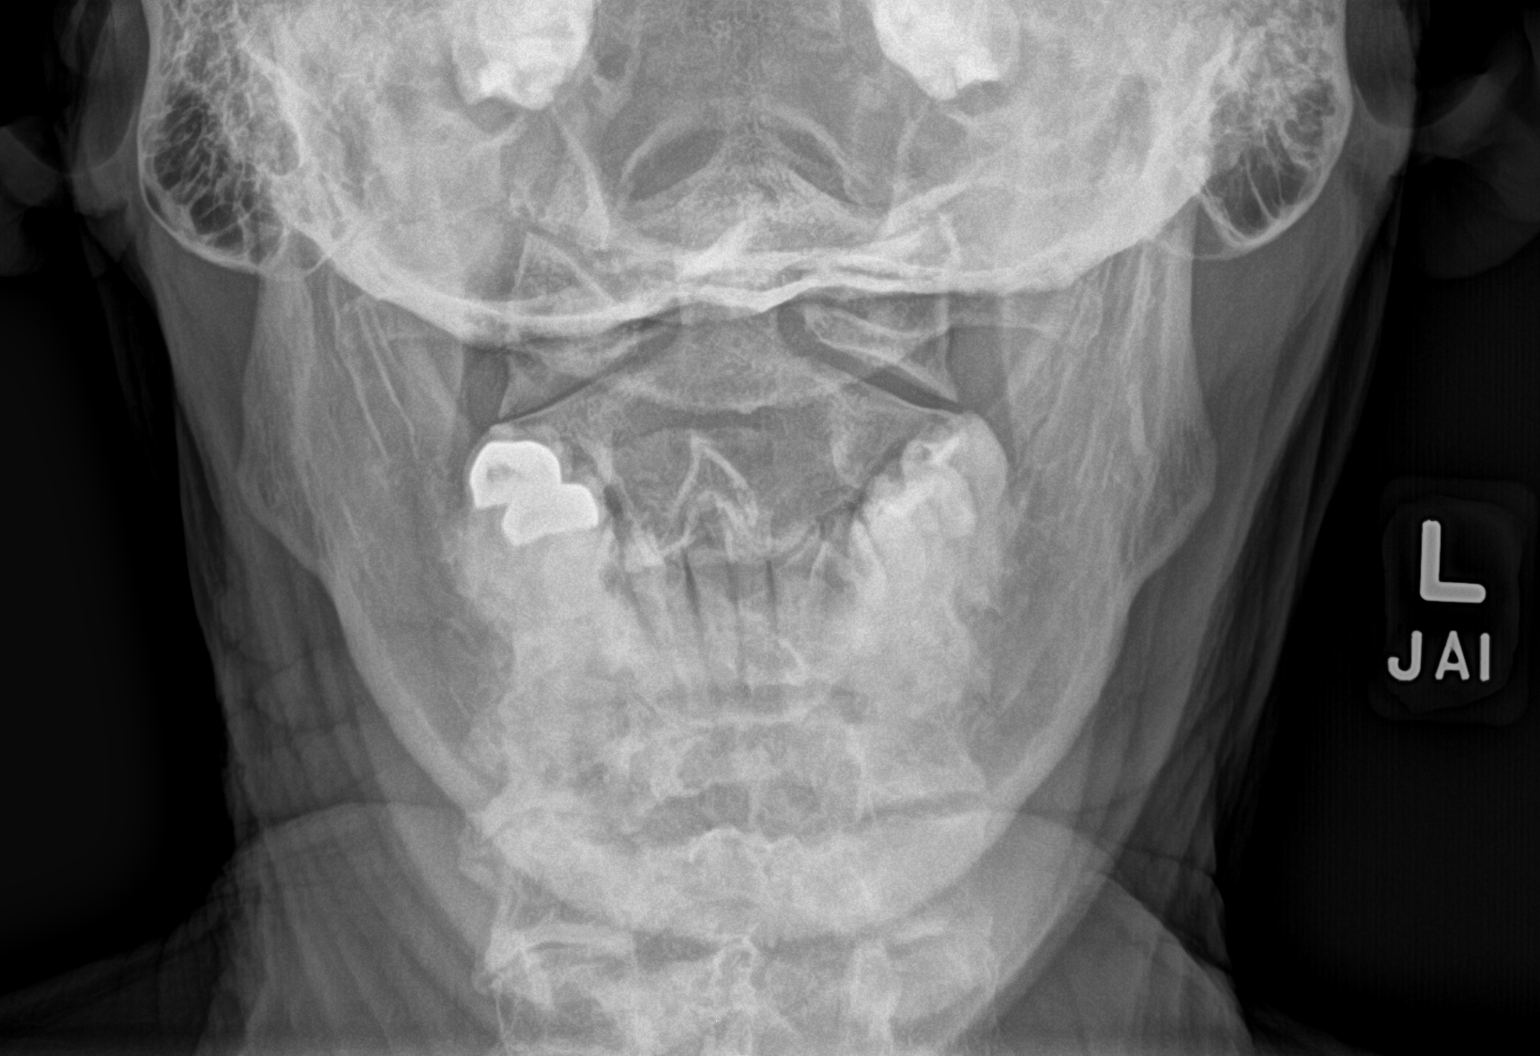

[3 of 3 positions shown; findings below may reference images not displayed]

FINDINGS: Seven cervical segments are well visualized. Vertebral body height
is well maintained. Disc space narrowing is noted at C5-6 and C6-7
with associated osteophytic change. No soft tissue abnormality is
seen. No acute fracture or acute facet abnormality is noted. The
odontoid is within normal limits.
IMPRESSION: Degenerative change without acute abnormality.

## 2020-12-03 IMAGING — DX DG THORACIC SPINE 2V
2 series · 2 of 2 positions shown · non-contrast
Comparison: None.

CLINICAL DATA: Thoracic spine pain following heavy lifting 3 weeks
ago, initial encounter

EXAM:
THORACIC SPINE 2 VIEWS

[t-spine ap]
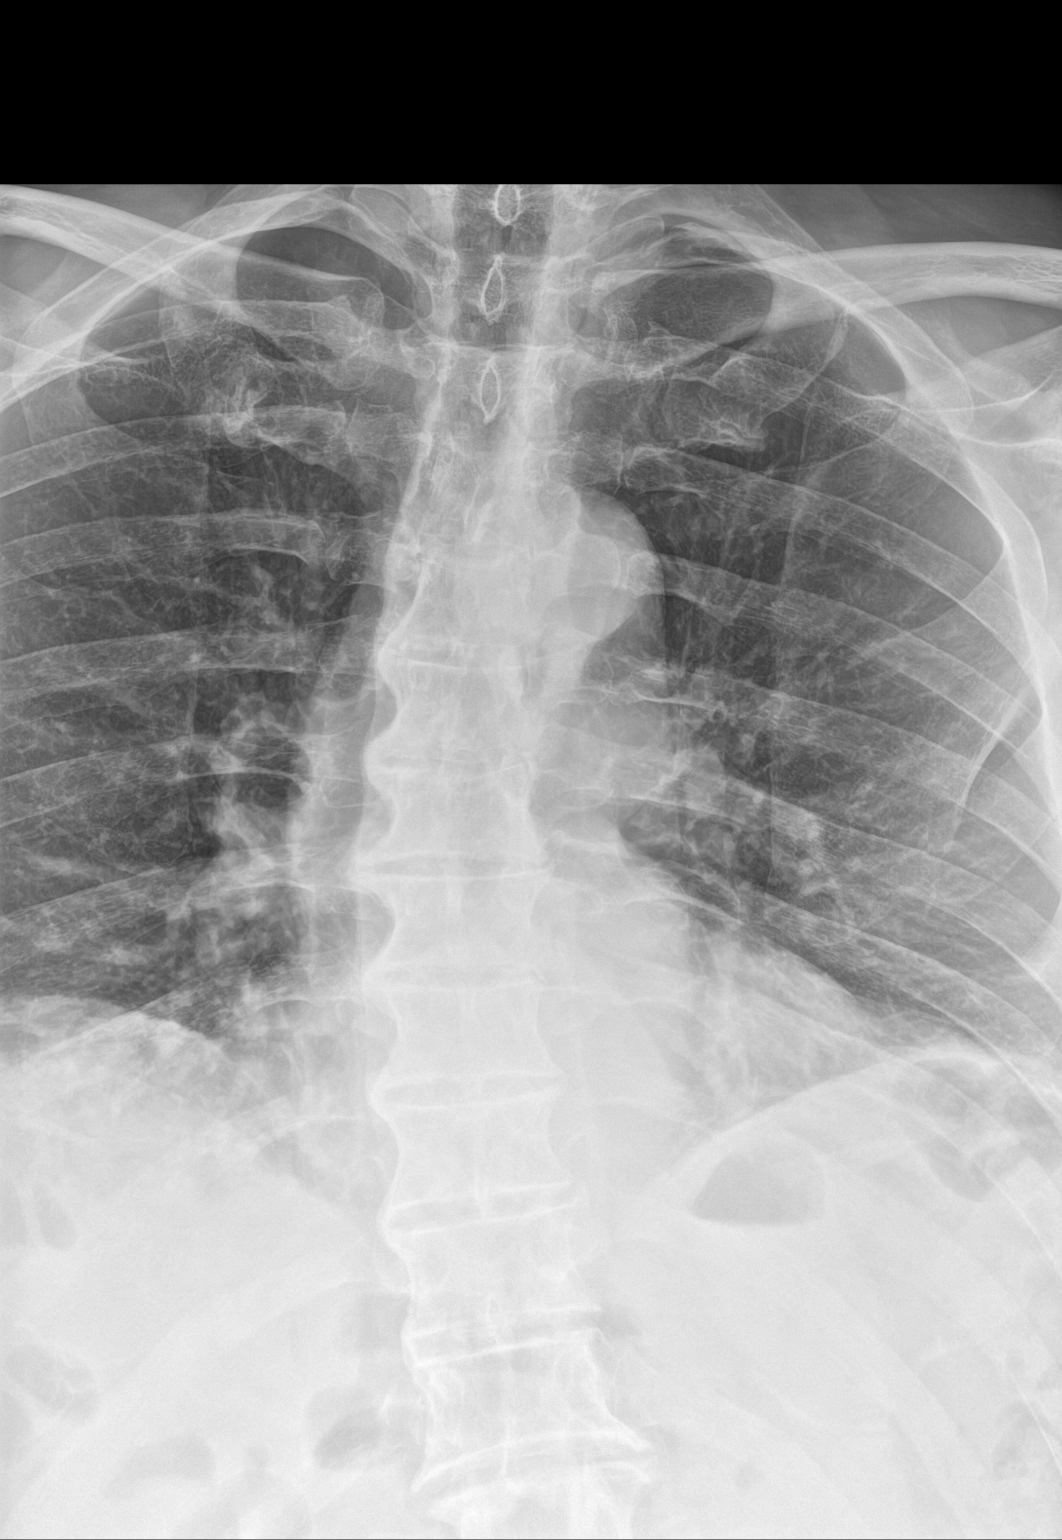

[t-spine lat]
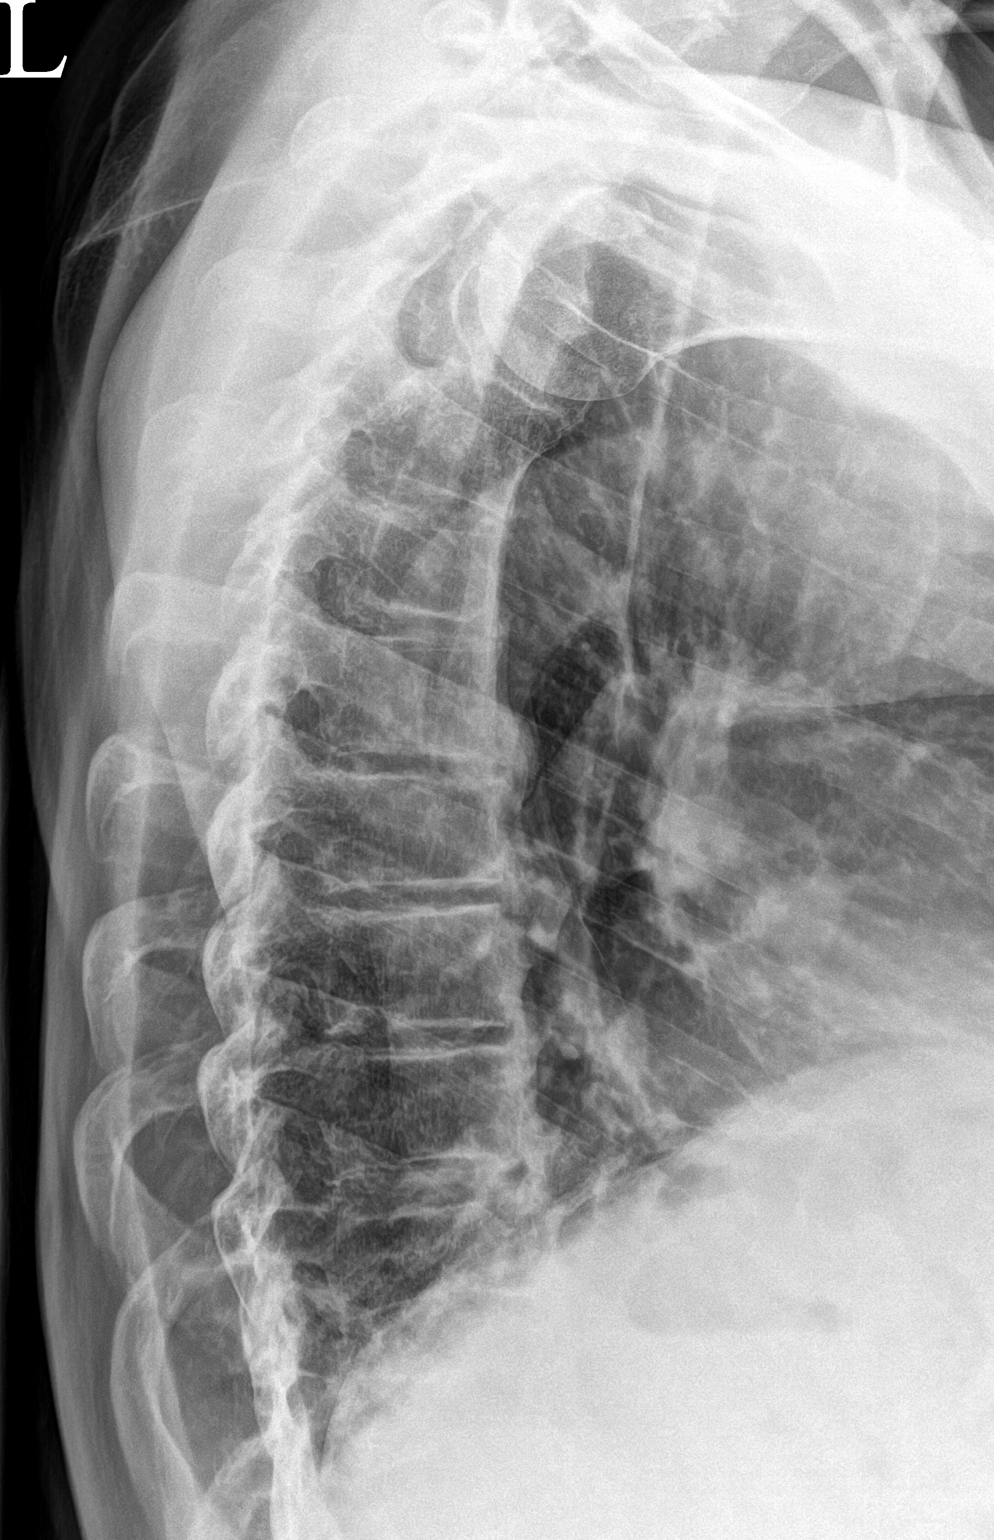

[2 of 2 positions shown; findings below may reference images not displayed]

FINDINGS: Mild osteophytic changes are noted. No vertebral body compression
deformity is seen. No paraspinal mass is noted. Mild scoliosis
concave to the left is noted. The ribs are within normal limits.
IMPRESSION: Mild degenerative change with scoliosis. No acute abnormality noted.

## 2020-12-03 NOTE — Progress Notes (Signed)
[place] Phone: [phone] Subjective:    I'm seeing this patient by the request  of:  Patient, No Pcp Per  CC: Knee and back pain follow-up  [id number]   09/27/2020 Left knee.  Ultrasound shows moderate to severe medial compartment and patellofemoral.  Some instability noted with some mild abnormal thigh to calf ratio.  Due to this I do feel patient would be a candidate for custom brace.  Patient wants to hold on any type of replacement as long as possible.  Held on any type of injection but will consider that at follow-up if we continue to have trouble.  Follow-up again 4 to 8 weeks.  Degenerative disc in the lumbar likely.  We will get x-rays to further evaluate.  I do believe that this is contributing to more of the discomfort and pain.  Patient would like to try a conservative approach.  Discussed over-the-counter medications, home exercises given.  Patient wanted to consider the possibility of formal physical therapy but has done that fairly recently.  Patient will follow up again in 4 to 8 weeks.  If continuing to have trouble I would encourage patient to consider gabapentin and then potentially reevaluation with physical therapy   Update 12/03/2020 Arieon Corcoran Sr. is a 70 y.o. male coming in with complaint of left knee and back pain. Patient states that he has had some improvement with the knee. Pain after walking his dog but not during. Patient is using custom knee brace with walking. Has been doing exercises prn. Would like recommendation for PT near [place]. Taking Vit D, tart cherry, and turmeric prn. Aleve prn when he plays tennis.   Back pain remains the lower back. Patient notes in the past 3 weeks he is having thoracic spine pain. Patient notes discomfort with serving tennis ball.   Patient states that his neck has always been a problem. Unable to rotate neck fully. PCP recommended  chiropractic care and patient is scheduled to do so in February.  Patient states that it is more of a limited range of motion.  Patient denies though any radiation to any of the extremities.  Does notice that has some difficulty with rotation in the mid back.   Patient's x-rays of the left knee shows the patient did have mild to moderate degenerative changes of the medial compartment of the knee.  Ultrasound showed the patient had more moderate to severe narrowing it appeared.  Lumbar x-rays show that patient did have degenerative disc disease from L2-S1 and facet arthropathy of the lumbar spine  No past medical history on file. No past surgical history on file. Social History   Socioeconomic History   Marital status: Married    Spouse name: Not on file   Number of children: Not on file   Years of education: Not on file   Highest education level: Not on file  Occupational History   Not on file  Tobacco Use   Smoking status: Not on file   Smokeless tobacco: Not on file  Substance and Sexual Activity   Alcohol use: Not on file   Drug use: Not on file   Sexual activity: Not on file  Other Topics Concern   Not on file  Social History Narrative   Not on file   Social Determinants of Health   Financial Resource Strain: Not on file  Food Insecurity: Not on file  Transportation Needs: Not on file  Physical Activity: Not on file  Stress: Not on file  Social Connections: Not on file   Not on File No family history on file.   Current Outpatient Medications (Cardiovascular):    atorvastatin (LIPITOR) 10 MG tablet, Take 10 mg by mouth daily.   hydrochlorothiazide (MICROZIDE) 12.5 MG capsule, Take 12.5 mg by mouth daily.   losartan (COZAAR) 50 MG tablet, Take 50 mg by mouth daily.     Current Outpatient Medications (Other):    TURMERIC PO, Take by mouth.   Reviewed prior external information including notes and imaging from  primary care provider As well as  notes that were available from care everywhere and other healthcare systems.  Past medical history, social, surgical and family history all reviewed in electronic medical record.  No pertanent information unless stated regarding to the chief complaint.   Review of Systems:  No headache, visual changes, nausea, vomiting, diarrhea, constipation, dizziness, abdominal pain, skin rash, fevers, chills, night sweats, weight loss, swollen lymph nodes, body aches, joint swelling, chest pain, shortness of breath, mood changes. POSITIVE muscle aches  Objective  Blood pressure 138/86, pulse 77, height 6' (1.829 m), weight 202 lb (91.6 kg), SpO2 96 %.   General: No apparent distress alert and oriented x3 mood and affect normal, dressed appropriately.  HEENT: Pupils equal, extraocular movements intact  Respiratory: Patient's speak in full sentences and does not appear short of breath  Cardiovascular: No lower extremity edema, non tender, no erythema  Gait patient does have significant loss of lordosis that causes patient to have a very mild abnormal gait. MSK: Left knee is improved.  Still has some instability with varus and valgus force.  Lacks the last 5 degrees of flexion.  Less tenderness than previous exam Mid back has significant tightness noted.  Patient does have pain around the T7-T8 area midline.  Patient has significant difficulty extending his back greater than 5 degrees in the thoracic spine and very minimal rotation.  Neck exam does have some loss of lordosis.  Lacks 15 degrees of rotation bilaterally.  Negative Spurling's.  5 of 5 strength of the upper extremities.      Impression and Recommendations:     The above documentation has been reviewed and is accurate and complete Lyndal Pulley, DO

## 2020-12-03 NOTE — Assessment & Plan Note (Signed)
Knee exam is doing better at this time.  Still some mild instability with valgus and varus force.  Ultrasound did show severe arthritis but x-ray only showed moderate arthritis.  Did discuss with patient to continue the brace, may need injections such as steroids or viscosupplementation.  Also discussed the possibility of advanced imaging but patient would want to avoid any type of surgical intervention.  We will have patient start physical therapy to learn home exercises and then be on his way.  Follow-up with me again 2 months

## 2020-12-03 NOTE — Patient Instructions (Addendum)
PT [facility]- thoracic and lumbar-They will call you Thoracic and cervical xray today Keep doing everything else See me again in 2-3 months

## 2020-12-03 NOTE — Assessment & Plan Note (Signed)
Significant loss of lordosis.  Patient does have tightness noted.  Likely underlying arthritic changes.  May need to consider the possibility of laboratory work-up and ruling out gout.  No radicular symptoms.  We will start with physical therapy.  May add gabapentin.  Follow-up again in 2 months

## 2020-12-03 NOTE — Assessment & Plan Note (Signed)
Patient does have arthritic changes of the back.  Concern for some potential spinal stenosis.  We will start with formal physical therapy.  Discussed with patient I do think that we may need to consider possible gabapentin.  We will discuss again at follow-up in 2 months worsening pain also need to consider the possibility of advanced imaging and possible MRIs and epidurals.

## 2020-12-21 ENCOUNTER — Ambulatory Visit: Payer: Medicare Other | Attending: Family Medicine | Admitting: Physical Therapy

## 2020-12-21 ENCOUNTER — Other Ambulatory Visit: Payer: Self-pay

## 2020-12-21 ENCOUNTER — Encounter: Payer: Self-pay | Admitting: Physical Therapy

## 2020-12-21 DIAGNOSIS — R293 Abnormal posture: Secondary | ICD-10-CM | POA: Insufficient documentation

## 2020-12-21 DIAGNOSIS — M545 Low back pain, unspecified: Secondary | ICD-10-CM | POA: Diagnosis not present

## 2020-12-21 DIAGNOSIS — R29898 Other symptoms and signs involving the musculoskeletal system: Secondary | ICD-10-CM | POA: Diagnosis not present

## 2020-12-21 DIAGNOSIS — M25652 Stiffness of left hip, not elsewhere classified: Secondary | ICD-10-CM | POA: Insufficient documentation

## 2020-12-21 DIAGNOSIS — G8929 Other chronic pain: Secondary | ICD-10-CM | POA: Diagnosis not present

## 2020-12-21 NOTE — Therapy (Signed)
[facility] [address]  [place], [place], [address] Phone: [phone]   Fax:  [phone]  Physical Therapy Evaluation  Patient Details  Name: Raymond Fullwood Sr. MRN: [id number] Date of Birth: 05/21/1951 Referring Provider (PT): Hulan Saas, DO   Encounter Date: 12/21/2020   PT End of Session - 12/21/20 1623    Visit Number 1    Number of Visits 10    Date for PT Re-Evaluation 02/22/21    Authorization Type Medicare & Generic    PT Start Time 1401    PT Stop Time 1447    PT Time Calculation (min) 46 min    Activity Tolerance Patient tolerated treatment well    Behavior During Therapy [facility] for tasks assessed/performed           History reviewed. No pertinent past medical history.  History reviewed. No pertinent surgical history.  There were no vitals filed for this visit.    Subjective Assessment - 12/21/20 1406    Subjective Patient reports that LBP has been ongoing for decades- more recently started having pain in the midback. Worse in the AM and having difficulty serving overhand during tennis. However, this went away about a week ago. LBP occurs across B LB- worse with prolonged sitting, tennis matches particularly during 2nd hour of the match, yard work. Denies N/T, radiation. Does report increased urination, however will F/U with MD to address this. Pain better with walking. Reports that he had injured his L knee in November but this is resolved. Would like to focus on his LB at this time.    Pertinent History HTN, LBP, HLD    Limitations Sitting;Lifting;Standing;House hold activities    How long can you walk comfortably? unlimited    Diagnostic tests 12/03/20 thoracic xray: Mild degenerative change with scoliosis. No acute abnormality noted; 12/03/20 cervical xray: Degenerative change without acute abnormality    Patient Stated Goals improve pain    Currently in Pain? Yes    Pain Score 2      Pain Location Back    Pain Orientation Lower    Pain Descriptors / Indicators Aching    Pain Type Chronic pain              [facility] PT Assessment - 12/21/20 1414      Assessment   Medical Diagnosis Thoracic spine pain, lumbar spine pain    Referring Provider (PT) Hulan Saas, DO    Onset Date/Surgical Date --   chronic- several decades   Hand Dominance Left    Next MD Visit 02/16/21    Prior Therapy Yes      Precautions   Precautions --   wearing L knee brace with tennis     Balance Screen   Has the patient fallen in the past 6 months Yes    How many times? 3-4   once on a hike being pulled by his dog, 3x playing tennis   Has the patient had a decrease in activity level because of a fear of falling?  Yes   reports imbalance with stairs   Is the patient reluctant to leave their home because of a fear of falling?  No      Home Social worker Private residence    Living Arrangements Spouse/significant other    Available Help at Discharge Family    Type of [place] to enter    Entrance  Stairs-Number of Steps 4-5    Entrance Stairs-Rails Right    Home Layout Two level   basement   Alternate Level Stairs-Number of Steps 15    Alternate Level Stairs-Rails Right;Left      Prior Function   Level of Independence Independent    Vocation Retired    Teacher, music some tennis lessons    Leisure Tennis 3-4x/week, yard work, Medical illustrator   Overall Cognitive Status Within Functional Limits for tasks assessed      Sensation   Light Touch Appears Intact      Coordination   Gross Motor Movements are Fluid and Coordinated Yes      Posture/Postural Control   Posture/Postural Control Postural limitations    Postural Limitations Rounded Shoulders;Forward head;Increased thoracic kyphosis      AROM   Overall AROM Comments "tightness"    Lumbar Flexion mid shin    Lumbar Extension moderately limited    Lumbar -  Right Side Bend mid thigh    Lumbar - Left Side Bend distal thigh    Lumbar - Right Rotation mildly limited    Lumbar - Left Rotation mildly limited      Strength   Strength Assessment Site Ankle    Right Hip Flexion 5/5    Right Hip ABduction 4/5    Right Hip ADduction 4+/5    Left Hip Flexion 5/5    Left Hip ABduction 4+/5    Left Hip ADduction 5/5    Right Knee Flexion 4+/5    Right Knee Extension 4+/5    Left Knee Flexion 4+/5    Left Knee Extension 4+/5    Right/Left Ankle Right;Left    Right Ankle Dorsiflexion 5/5    Right Ankle Plantar Flexion 4+/5    Left Ankle Dorsiflexion 5/5    Left Ankle Plantar Flexion 4+/5      Flexibility   Soft Tissue Assessment /Muscle Length yes    Hamstrings L mildly tight; R WFL    Quadriceps L LE mildly tight; R WFL in mod thomas    Piriformis unable to perform figure 4 on L d/t tightness; R LE mildly tight in figure 4      Palpation   Palpation comment no TTP; increased soft tissue restriction in L proximal glutes and piriformis      Ambulation/Gait   Gait Pattern Step-to pattern;Step-through pattern;Decreased step length - left;Decreased step length - right;Poor foot clearance - right;Poor foot clearance - left    Ambulation Surface Level;Indoor    Gait velocity slightly decreased                      Objective measurements completed on examination: See above findings.               PT Education - 12/21/20 1623    Education Details prognosis, POC, HEP    Person(s) Educated Patient    Methods Explanation;Demonstration;Tactile cues;Verbal cues;Handout    Comprehension Verbalized understanding;Returned demonstration            PT Short Term Goals - 12/21/20 1800      PT SHORT TERM GOAL #1   Title Pt will be I and compliant with initial HEP.    Time 3    Period Weeks    Status New    Target Date 01/11/21             PT Long Term Goals - 12/21/20 1800  PT LONG TERM GOAL #1   Title Pt  will be I and compliant with long term HEP for maintenance.    Time 6    Period Weeks    Status New    Target Date 02/01/21      PT LONG TERM GOAL #2   Title Pt will demo pain free lumbar AROM with no pain.    Time 6    Period Weeks    Status New    Target Date 02/01/21      PT LONG TERM GOAL #3   Title Pt will demo R symmetrical to L LE strength.    Time 6    Period Weeks    Status New    Target Date 02/01/21      PT LONG TERM GOAL #4   Title Pt will report 75% improvement in tolerance for yard work and Artist.    Time 6    Period Weeks    Status New    Target Date 02/01/21      PT LONG TERM GOAL #5   Title Patient to demonstrate ability to cross L ankle over R knee for improved ease of dressing.    Time 6    Period Weeks    Status New    Target Date 02/01/21                  Plan - 12/21/20 1624    Clinical Impression Statement Patient is a 70 y/o M presenting to [facility] with c/o chronic LBP for the past several decades. Also reports hx of thoracic and L knee pain but these seem to have resolved. LBP occurs over B LB without radiation or N/T. Does report recently increased urination but plans to f/u with his PCP about this. Pain worse with prolonged sitting, long tennis matches, and yard work. Patient today presenting with rounded shoulders and forward head posture, limited lumbar AROM, decreased L hip abduction strength, hip, quad, and HS tightness, L>R, increased soft tissue restriction in L proximal glutes and piriformis, and gait deviations. Patient was educated on gentle stretching and strengthening HEP- patient reported understanding. Would benefit from skilled PT services 2x/week for 3 weeks followed by 1x/week for 3 weeks to address aforementioned impairments.    Personal Factors and Comorbidities Age;Past/Current Experience;Time since onset of injury/illness/exacerbation;Profession;Comorbidity 3+;Fitness    Comorbidities HTN, LBP, HLD     Examination-Activity Limitations Bend;Carry;Lift;Squat;Sit    Examination-Participation Restrictions Church;Yard Work    Stability/Clinical Decision Making Stable/Uncomplicated    Designer, jewellery Low    Rehab Potential Good    PT Frequency Other (comment)   2x/week for 3 weeks followed by 1x/week for 3 weeks   PT Duration Other (comment)    PT Treatment/Interventions ADLs/Self Care Home Management;Traction;Moist Heat;Iontophoresis 4mg /ml Dexamethasone;Electrical Stimulation;Cryotherapy;Therapeutic activities;Functional mobility training;Therapeutic exercise;Balance training;Neuromuscular re-education;Patient/family education;Manual techniques;Dry needling;Stair training;Gait training;Ultrasound;Taping;Passive range of motion    PT Next Visit Plan lumbar FOTO; reassess HEP; progress L hip stretching, lumbar AROM, hip strengthening    Consulted and Agree with Plan of Care Patient           Patient will benefit from skilled therapeutic intervention in order to improve the following deficits and impairments:  Hypomobility,Decreased activity tolerance,Decreased strength,Pain,Increased fascial restricitons,Decreased balance,Difficulty walking,Increased muscle spasms,Improper body mechanics,Decreased range of motion,Postural dysfunction,Impaired flexibility  Visit Diagnosis: Chronic bilateral low back pain without sciatica  Stiffness of left hip, not elsewhere classified  Other symptoms and signs involving the musculoskeletal system  Abnormal posture  Problem List Patient Active Problem List   Diagnosis Date Noted  . Degenerative disc disease, cervical 12/03/2020  . Degenerative lumbar disc 09/27/2020  . Degenerative arthritis of left knee 09/27/2020     Janene Harvey, PT, DPT 12/21/20 6:04 PM   [facility] [address]  [place], [place], [address] Phone: [phone]   Fax:  [phone]  Name:  Raymond Ganaway Sr. MRN: [id number] Date of Birth: 31-Mar-1951

## 2020-12-27 ENCOUNTER — Encounter: Payer: Self-pay | Admitting: Physical Therapy

## 2020-12-27 ENCOUNTER — Ambulatory Visit: Payer: Medicare Other | Admitting: Physical Therapy

## 2020-12-27 ENCOUNTER — Other Ambulatory Visit: Payer: Self-pay

## 2020-12-27 VITALS — BP 122/84 | HR 91

## 2020-12-27 DIAGNOSIS — M545 Low back pain, unspecified: Secondary | ICD-10-CM | POA: Diagnosis not present

## 2020-12-27 DIAGNOSIS — R29898 Other symptoms and signs involving the musculoskeletal system: Secondary | ICD-10-CM

## 2020-12-27 DIAGNOSIS — R293 Abnormal posture: Secondary | ICD-10-CM

## 2020-12-27 DIAGNOSIS — G8929 Other chronic pain: Secondary | ICD-10-CM

## 2020-12-27 DIAGNOSIS — M25652 Stiffness of left hip, not elsewhere classified: Secondary | ICD-10-CM

## 2020-12-27 NOTE — Therapy (Signed)
[facility] [address]  [place], [place], [address] Phone: [phone]   Fax:  [phone]  Physical Therapy Treatment  Patient Details  Name: Raymond Helderman Sr. MRN: [id number] Date of Birth: 04/29/1951 Referring Provider (PT): Hulan Saas, DO   Encounter Date: 12/27/2020   PT End of Session - 12/27/20 1440    Visit Number 2    Number of Visits 10    Date for PT Re-Evaluation 02/22/21    Authorization Type Medicare & Generic    PT Start Time 2751    PT Stop Time 1440    PT Time Calculation (min) 42 min    Activity Tolerance Patient tolerated treatment well    Behavior During Therapy [facility] for tasks assessed/performed           History reviewed. No pertinent past medical history.  History reviewed. No pertinent surgical history.  Vitals:   12/27/20 1407  BP: 122/84  Pulse: 91     Subjective Assessment - 12/27/20 1400    Subjective Reports difficulty performing the L hip stretch as he is unable to tolerate it for the full 30 sec. Reports that he has breathing problems during this stretch and with tennis matches as he reports he has been less active lately. Denies chest pain/tightness, dizziness.    Pertinent History HTN, LBP, HLD    Diagnostic tests 12/03/20 thoracic xray: Mild degenerative change with scoliosis. No acute abnormality noted; 12/03/20 cervical xray: Degenerative change without acute abnormality    Patient Stated Goals improve pain    Currently in Pain? No/denies              [facility] PT Assessment - 12/27/20 0001      Observation/Other Assessments   Focus on Therapeutic Outcomes (FOTO)  Lumbar: 63 (67 predicted)                         [facility] Adult PT Treatment/Exercise - 12/27/20 0001      Exercises   Exercises Lumbar;Knee/Hip      Lumbar Exercises: Stretches   Figure 4 Stretch 2 reps;30 seconds;With overpressure    Figure 4 Stretch Limitations L LE with 1  pillow under knee   improved tolerance with correction of alignment     Lumbar Exercises: Aerobic   Nustep L5 x 3 min; L1 x 3 min   decreased resistance d/t SOB; cues to decrease speed to improve tolernace     Lumbar Exercises: Supine   Bridge with clamshell 10 reps   2x10; red TB     Lumbar Exercises: Sidelying   Hip Abduction Right;Left;10 reps    Hip Abduction Weights (lbs) manual cues for alignment    Other Sidelying Lumbar Exercises R/L open book stretch 10x each   heavy compensation with shoulder, which was corrected; limited ROM on L     Lumbar Exercises: Prone   Other Prone Lumbar Exercises prone on elbows 10x3"   limited ROM                 PT Education - 12/27/20 1439    Education Details update to HEP; review of HEP; advised patient to avoid starting new running program while attempting to rehab LB    Person(s) Educated Patient    Methods Explanation;Demonstration;Tactile cues;Verbal cues;Handout    Comprehension Returned demonstration;Verbalized understanding            PT Short Term Goals - 12/27/20 1442  PT SHORT TERM GOAL #1   Title Pt will be I and compliant with initial HEP.    Time 3    Period Weeks    Status On-going    Target Date 01/11/21             PT Long Term Goals - 12/27/20 1442      PT LONG TERM GOAL #1   Title Pt will be I and compliant with long term HEP for maintenance.    Time 6    Period Weeks    Status On-going      PT LONG TERM GOAL #2   Title Pt will demo pain free lumbar AROM with no pain.    Time 6    Period Weeks    Status On-going      PT LONG TERM GOAL #3   Title Pt will demo R symmetrical to L LE strength.    Time 6    Period Weeks    Status On-going      PT LONG TERM GOAL #4   Title Pt will report 75% improvement in tolerance for yard work and Artist.    Time 6    Period Weeks    Status On-going      PT LONG TERM GOAL #5   Title Patient to demonstrate ability to cross L ankle over R  knee for improved ease of dressing.    Time 6    Period Weeks    Status On-going                 Plan - 12/27/20 1442    Clinical Impression Statement Patient reporting compliance with HEP but reporting difficulty performing L hip stretch. Also reporting episodes of LOB while performing stretching and playing tennis. Patient denies chest pain/tightness, dizziness during these episodes. Assessed vitals after warm up, which appeared normal. Reviewed modified hip ER stretch with correction of alignment required for improved tolerance. Patient remarking on ability to nearly be able to cross the L ankle over the R knee since performing his stretches. Patient required correction of form with lumbar stretches and hip strengthening. Patient demonstrated more ROM limitation to L vs. R lumbar rotation and more weakness in R vs. L hip with ther-ex. Patient tolerated full session without pain. Updated HJEP with new lumbar mobility exercise that was well-tolerated today. Patient reported understanding of all edu provided today and without complaints at end of session.    Comorbidities HTN, LBP, HLD    PT Treatment/Interventions ADLs/Self Care Home Management;Traction;Moist Heat;Iontophoresis 4mg /ml Dexamethasone;Electrical Stimulation;Cryotherapy;Therapeutic activities;Functional mobility training;Therapeutic exercise;Balance training;Neuromuscular re-education;Patient/family education;Manual techniques;Dry needling;Stair training;Gait training;Ultrasound;Taping;Passive range of motion    PT Next Visit Plan reassess HEP; progress L hip stretching, lumbar AROM, hip strengthening    Consulted and Agree with Plan of Care Patient           Patient will benefit from skilled therapeutic intervention in order to improve the following deficits and impairments:  Hypomobility,Decreased activity tolerance,Decreased strength,Pain,Increased fascial restricitons,Decreased balance,Difficulty walking,Increased muscle  spasms,Improper body mechanics,Decreased range of motion,Postural dysfunction,Impaired flexibility  Visit Diagnosis: Chronic bilateral low back pain without sciatica  Stiffness of left hip, not elsewhere classified  Other symptoms and signs involving the musculoskeletal system  Abnormal posture     Problem List Patient Active Problem List   Diagnosis Date Noted  . Degenerative disc disease, cervical 12/03/2020  . Degenerative lumbar disc 09/27/2020  . Degenerative arthritis of left knee 09/27/2020     Janene Harvey,  PT, DPT 12/27/20 2:43 PM   [facility] [address]  [place], [place], [address] Phone: [phone]   Fax:  [phone]  Name: Dex Blakely Sr. MRN: [id number] Date of Birth: 1951-07-14

## 2020-12-29 ENCOUNTER — Other Ambulatory Visit: Payer: Self-pay

## 2020-12-29 ENCOUNTER — Encounter: Payer: Self-pay | Admitting: Physical Therapy

## 2020-12-29 ENCOUNTER — Ambulatory Visit: Payer: Medicare Other | Admitting: Physical Therapy

## 2020-12-29 VITALS — BP 135/72 | HR 91

## 2020-12-29 DIAGNOSIS — M25652 Stiffness of left hip, not elsewhere classified: Secondary | ICD-10-CM | POA: Diagnosis not present

## 2020-12-29 DIAGNOSIS — G8929 Other chronic pain: Secondary | ICD-10-CM | POA: Diagnosis not present

## 2020-12-29 DIAGNOSIS — R29898 Other symptoms and signs involving the musculoskeletal system: Secondary | ICD-10-CM | POA: Diagnosis not present

## 2020-12-29 DIAGNOSIS — R293 Abnormal posture: Secondary | ICD-10-CM | POA: Diagnosis not present

## 2020-12-29 DIAGNOSIS — M545 Low back pain, unspecified: Secondary | ICD-10-CM | POA: Diagnosis not present

## 2020-12-29 NOTE — Therapy (Addendum)
[facility] [address]  [place], [place], [address] Phone: [phone]   Fax:  [phone]  Physical Therapy Treatment  Patient Details  Name: Raymond Gibbard Sr. MRN: [id number] Date of Birth: 01/03/1951 Referring Provider (PT): Hulan Saas, DO  Progress Note Reporting Period 12/21/20 to 12/29/20  See note below for Objective Data and Assessment of Progress/Goals.     Encounter Date: 12/29/2020   PT End of Session - 12/29/20 1655    Visit Number 3    Number of Visits 10    Date for PT Re-Evaluation 02/22/21    Authorization Type Medicare & Generic    PT Start Time 1615    PT Stop Time 1654    PT Time Calculation (min) 39 min    Activity Tolerance Patient tolerated treatment well    Behavior During Therapy WFL for tasks assessed/performed           History reviewed. No pertinent past medical history.  History reviewed. No pertinent surgical history.  Vitals:   12/29/20 1625  BP: 135/72  Pulse: 91  SpO2: 97%     Subjective Assessment - 12/29/20 1618    Subjective Coming in from a tennis match- notes slightly improvement movement during the match than normal. Yesterday was a bad day because he had to do a lot of sitting. Has some questions on his HEP.    Pertinent History HTN, LBP, HLD    Diagnostic tests 12/03/20 thoracic xray: Mild degenerative change with scoliosis. No acute abnormality noted; 12/03/20 cervical xray: Degenerative change without acute abnormality    Patient Stated Goals improve pain    Currently in Pain? No/denies                             [facility] Adult PT Treatment/Exercise - 12/29/20 0001      Lumbar Exercises: Stretches   Figure 4 Stretch 2 reps;30 seconds;With overpressure    Figure 4 Stretch Limitations supine   position adjusted for max success     Lumbar Exercises: Aerobic   Nustep L4 x 6 min (UEs/LEs)      Lumbar Exercises: Prone   Other  Prone Lumbar Exercises prone on elbows 10x3"   cues to adjust hand position     Knee/Hip Exercises: Stretches   Active Hamstring Stretch Right;Left;1 rep;30 seconds    Active Hamstring Stretch Limitations sitting with ankle DF      Knee/Hip Exercises: Standing   Hip Abduction Stengthening;Right;Left;10 reps;Knee straight;2 sets    Abduction Limitations 2# 10x, 0# 10x   heavy cueing for anterior and lateral trunk lean   Hip Extension Stengthening;Right;Left;1 set;10 reps;Knee straight    Extension Limitations manual/verbal cueing to maintain upright trunk                  PT Education - 12/29/20 1655    Education Details update to HEP; advised patient to speak to PCP about new symptom of SOB on exertion    Person(s) Educated Patient    Methods Explanation;Demonstration;Tactile cues;Verbal cues;Handout    Comprehension Verbalized understanding;Returned demonstration            PT Short Term Goals - 12/29/20 1656      PT SHORT TERM GOAL #1   Title Pt will be I and compliant with initial HEP.    Time 3    Period Weeks    Status Achieved  Target Date 01/11/21             PT Long Term Goals - 12/27/20 1442      PT LONG TERM GOAL #1   Title Pt will be I and compliant with long term HEP for maintenance.    Time 6    Period Weeks    Status On-going      PT LONG TERM GOAL #2   Title Pt will demo pain free lumbar AROM with no pain.    Time 6    Period Weeks    Status On-going      PT LONG TERM GOAL #3   Title Pt will demo R symmetrical to L LE strength.    Time 6    Period Weeks    Status On-going      PT LONG TERM GOAL #4   Title Pt will report 75% improvement in tolerance for yard work and Artist.    Time 6    Period Weeks    Status On-going      PT LONG TERM GOAL #5   Title Patient to demonstrate ability to cross L ankle over R knee for improved ease of dressing.    Time 6    Period Weeks    Status On-going                 Plan  - 12/29/20 1656    Clinical Impression Statement Patient arrived to session with report of slight improvement in tolerance for tennis match today. Patient demonstrating increasing SOB and difficulty maintaining conversation with warm up. Vitals were assessed, which were WNL. Patient reports that he will be seeing his PCP to address this concern next week. Took time to address patient's questions on current HEP and provided modification to L hip stretch for improved success. Hip strengthening was performing in [place] with additional weight. Patient demonstrated heavy trunk compensations d/t hip weakness which required consistent cueing- better form after removal of weight. Patient noted slight HS cramp after standing ther-ex which was relieved with stretching. Patient reported all edu provided today and without complaints at end of session.    Comorbidities HTN, LBP, HLD    PT Treatment/Interventions ADLs/Self Care Home Management;Traction;Moist Heat;Iontophoresis 95m/ml Dexamethasone;Electrical Stimulation;Cryotherapy;Therapeutic activities;Functional mobility training;Therapeutic exercise;Balance training;Neuromuscular re-education;Patient/family education;Manual techniques;Dry needling;Stair training;Gait training;Ultrasound;Taping;Passive range of motion    PT Next Visit Plan progress L hip stretching, lumbar AROM, hip strengthening    Consulted and Agree with Plan of Care Patient           Patient will benefit from skilled therapeutic intervention in order to improve the following deficits and impairments:  Hypomobility,Decreased activity tolerance,Decreased strength,Pain,Increased fascial restricitons,Decreased balance,Difficulty walking,Increased muscle spasms,Improper body mechanics,Decreased range of motion,Postural dysfunction,Impaired flexibility  Visit Diagnosis: Chronic bilateral low back pain without sciatica  Stiffness of left hip, not elsewhere classified  Other symptoms and signs  involving the musculoskeletal system  Abnormal posture     Problem List Patient Active Problem List   Diagnosis Date Noted  . Degenerative disc disease, cervical 12/03/2020  . Degenerative lumbar disc 09/27/2020  . Degenerative arthritis of left knee 09/27/2020     YJanene Harvey PT, DPT 12/29/20 4:57 PM    [facility] [address] [place] [address]Phone: [phone]  Fax:  [phone] Name: Raymond MotaSr. MRN: [id number] of Birth: [birth year] PHYSICAL THERAPY DISCHARGE SUMMARY  Visits from Start of  Care: 3  Current functional level related to goals / functional outcomes: Unable to assess; patient did not return   Remaining deficits: Unable to assess   Education / Equipment: HEP  Plan: Patient agrees to discharge.  Patient goals were not met. Patient is being discharged due to not returning since the last visit.  ?????     Janene Harvey, PT, DPT 03/02/21 11:42 AM

## 2021-01-05 ENCOUNTER — Ambulatory Visit: Payer: Medicare Other | Admitting: Physical Therapy

## 2021-01-13 ENCOUNTER — Ambulatory Visit: Payer: Medicare Other | Admitting: Physical Therapy

## 2021-01-28 DIAGNOSIS — L821 Other seborrheic keratosis: Secondary | ICD-10-CM | POA: Diagnosis not present

## 2021-01-28 DIAGNOSIS — Z85828 Personal history of other malignant neoplasm of skin: Secondary | ICD-10-CM | POA: Diagnosis not present

## 2021-01-28 DIAGNOSIS — D1801 Hemangioma of skin and subcutaneous tissue: Secondary | ICD-10-CM | POA: Diagnosis not present

## 2021-01-28 DIAGNOSIS — L814 Other melanin hyperpigmentation: Secondary | ICD-10-CM | POA: Diagnosis not present

## 2021-01-28 DIAGNOSIS — L570 Actinic keratosis: Secondary | ICD-10-CM | POA: Diagnosis not present

## 2021-02-15 DIAGNOSIS — Z23 Encounter for immunization: Secondary | ICD-10-CM | POA: Diagnosis not present

## 2021-02-16 ENCOUNTER — Ambulatory Visit: Payer: PRIVATE HEALTH INSURANCE | Admitting: Family Medicine

## 2021-02-23 ENCOUNTER — Telehealth: Payer: Self-pay | Admitting: Family Medicine

## 2021-02-23 NOTE — Telephone Encounter (Signed)
Just FYI-no response needed. Patient has next visit 4/19, would like to go over the most recent chest/back xrays as he has specific questions.

## 2021-02-28 NOTE — Progress Notes (Signed)
[place] Phone: [phone] Subjective:   [name], am serving as a scribe for Dr. Hulan Saas. This visit occurred during the SARS-CoV-2 public health emergency.  Safety protocols were in place, including screening questions prior to the visit, additional usage of staff PPE, and extensive cleaning of exam room while observing appropriate contact time as indicated for disinfecting solutions.   CC: Neck pain follow-up  IDP:[id number]   12/03/2020 Knee exam is doing better at this time.  Still some mild instability with valgus and varus force.  Ultrasound did show severe arthritis but x-ray only showed moderate arthritis.  Did discuss with patient to continue the brace, may need injections such as steroids or viscosupplementation.  Also discussed the possibility of advanced imaging but patient would want to avoid any type of surgical intervention.  We will have patient start physical therapy to learn home exercises and then be on his way.  Follow-up with me again 2 months  Significant loss of lordosis.  Patient does have tightness noted.  Likely underlying arthritic changes.  May need to consider the possibility of laboratory work-up and ruling out gout.  No radicular symptoms.  We will start with physical therapy.  May add gabapentin.  Follow-up again in 2 months  Patient does have arthritic changes of the back.  Concern for some potential spinal stenosis.  We will start with formal physical therapy.  Discussed with patient I do think that we may need to consider possible gabapentin.  We will discuss again at follow-up in 2 months worsening pain also need to consider the possibility of advanced imaging and possible MRIs and epidurals.  Update 03/01/2021 Raymond Like Sr. is a 70 y.o. male coming in with complaint of cervical, lumbar, and knee pain. Patient states that his neck has been bothering him more than  usual over past 3 days. Took aleve and his pain is improved today. Complains of stiffness in his neck and decrease in ROM. Patient would like to discuss medication.   Thoracic xray 12/03/2020 IMPRESSION: Mild degenerative change with scoliosis. No acute abnormality noted.  Cervical xray 12/03/2020 IMPRESSION: Degenerative change without acute abnormality.      No past medical history on file. No past surgical history on file. Social History   Socioeconomic History  . Marital status: Married    Spouse name: Not on file  . Number of children: Not on file  . Years of education: Not on file  . Highest education level: Not on file  Occupational History  . Not on file  Tobacco Use  . Smoking status: Not on file  . Smokeless tobacco: Not on file  Substance and Sexual Activity  . Alcohol use: Not on file  . Drug use: Not on file  . Sexual activity: Not on file  Other Topics Concern  . Not on file  Social History Narrative  . Not on file   Social Determinants of Health   Financial Resource Strain: Not on file  Food Insecurity: Not on file  Transportation Needs: Not on file  Physical Activity: Not on file  Stress: Not on file  Social Connections: Not on file   Allergies  Allergen Reactions  . Penicillins Hives and Rash    Severe Rash    No family history on file.   Current Outpatient Medications (Cardiovascular):  .  atorvastatin (LIPITOR) 10 MG tablet, Take 10 mg by mouth daily. .  hydrochlorothiazide (MICROZIDE)  12.5 MG capsule, Take 12.5 mg by mouth daily. Marland Kitchen  losartan (COZAAR) 50 MG tablet, Take 50 mg by mouth daily.     Current Outpatient Medications (Other):  .  cholecalciferol (VITAMIN D3) 25 MCG (1000 UNIT) tablet, Take 1,000 Units by mouth daily. Marland Kitchen  gabapentin (NEURONTIN) 100 MG capsule, Take 2 capsules (200 mg total) by mouth at bedtime. .  TURMERIC PO, Take by mouth.   Reviewed prior external information including notes and imaging from  primary  care provider As well as notes that were available from care everywhere and other healthcare systems.  Past medical history, social, surgical and family history all reviewed in electronic medical record.  No pertanent information unless stated regarding to the chief complaint.   Review of Systems:  No headache, visual changes, nausea, vomiting, diarrhea, constipation, dizziness, abdominal pain, skin rash, fevers, chills, night sweats, weight loss, swollen lymph nodes, body aches, joint swelling, chest pain, shortness of breath, mood changes. POSITIVE muscle aches  Objective  Blood pressure (!) 140/100, pulse 71, height 6' (1.829 m), weight 205 lb (93 kg), SpO2 95 %.   General: No apparent distress alert and oriented x3 mood and affect normal, dressed appropriately.  HEENT: Pupils equal, extraocular movements intact  Respiratory: Patient's speak in full sentences and does not appear short of breath  Cardiovascular: No lower extremity edema, non tender, no erythema  Gait normal with good balance and coordination.  MSK:   Neck exam does have loss of lordosis.  Only 5 degrees of extension noted.  The patient has some mild crepitus noted.  Limited sidebending bilaterally.  Good strength in the upper extremities.  Negative Spurling's noted today.    Impression and Recommendations:     The above documentation has been reviewed and is accurate and complete Lyndal Pulley, DO

## 2021-03-01 ENCOUNTER — Encounter: Payer: Self-pay | Admitting: Family Medicine

## 2021-03-01 ENCOUNTER — Other Ambulatory Visit: Payer: Self-pay

## 2021-03-01 ENCOUNTER — Ambulatory Visit (INDEPENDENT_AMBULATORY_CARE_PROVIDER_SITE_OTHER): Payer: Medicare Other | Admitting: Family Medicine

## 2021-03-01 DIAGNOSIS — M5030 Other cervical disc degeneration, unspecified cervical region: Secondary | ICD-10-CM

## 2021-03-01 MED ORDER — GABAPENTIN 100 MG PO CAPS: 200.0000 mg | ORAL_CAPSULE | Freq: Every day | ORAL | 0 refills | Status: DC

## 2021-03-01 NOTE — Patient Instructions (Addendum)
Raymond Mourning, NP-[place] office Do exercises on your own Work on upper back strengthening more than neck ROM Gabapentin 200mg  at night Look for side effects but should be ok Follow up in 6 weeks

## 2021-03-01 NOTE — Assessment & Plan Note (Signed)
Patient's x-rays do show significant arthritic changes.  Started on gabapentin for this chronic problem with exacerbation.  Patient has done formal physical therapy previously but wants to hold on it at this point but will be working with a Physiological scientist.  We discussed which activities he used to potentially avoid that could maybe make it worse.  Discussed icing regimen.  Follow-up with me again 6 weeks

## 2021-04-11 NOTE — Progress Notes (Signed)
[place] [address] [place] Phone: [phone] Subjective:   I Kandace Blitz am serving as a Education administrator for Dr. Hulan Saas.  This visit occurred during the SARS-CoV-2 public health emergency.  Safety protocols were in place, including screening questions prior to the visit, additional usage of staff PPE, and extensive cleaning of exam room while observing appropriate contact time as indicated for disinfecting solutions.   I'm seeing this patient by the request  of:  Patient, No Pcp Per (Inactive)  CC: back, neck and knee pain   [id number]   03/01/2021 Patient's x-rays do show significant arthritic changes.  Started on gabapentin for this chronic problem with exacerbation.  Patient has done formal physical therapy previously but wants to hold on it at this point but will be working with a Physiological scientist.  We discussed which activities he used to potentially avoid that could maybe make it worse.  Discussed icing regimen.  Follow-up with me again 6 weeks  Update 04/12/2021 Raymond Like Sr. is a 70 y.o. male coming in with complaint of cervical spine pain. Patient states that he is a little better. Has been taking gabapentin and believes it is helping. States he is now dealing with stiffness and lack of ROM in the neck. Still having left knee and back issues. Overall he believes he has 50% improvement in his neck and 10% improvement in knee and back. Not exercising as much as he would like. Wears knee brace occasionally and states that it is too heavy but is working on wearing it more consistently.   Patient is more concerned about his lower back.  Seems to be worsening.  Also having more of a new symptom which is including he is having decreased to no feeling with ejaculation at this point.  And some mild erectile dysfunction as well.  Past medical history is significant for having enlarged prostate and was seen by urologist in [place].   We do not have the patient's most recent labs but patient states that his PSA was within normal range.   xray of neck significant OA and all levels.  Was started on gabapentin 200mg  at night   No past medical history on file. No past surgical history on file. Social History   Socioeconomic History  . Marital status: Married    Spouse name: Not on file  . Number of children: Not on file  . Years of education: Not on file  . Highest education level: Not on file  Occupational History  . Not on file  Tobacco Use  . Smoking status: Not on file  . Smokeless tobacco: Not on file  Substance and Sexual Activity  . Alcohol use: Not on file  . Drug use: Not on file  . Sexual activity: Not on file  Other Topics Concern  . Not on file  Social History Narrative  . Not on file   Social Determinants of Health   Financial Resource Strain: Not on file  Food Insecurity: Not on file  Transportation Needs: Not on file  Physical Activity: Not on file  Stress: Not on file  Social Connections: Not on file   Allergies  Allergen Reactions  . Penicillins Hives and Rash    Severe Rash    No family history on file.   Current Outpatient Medications (Cardiovascular):  .  atorvastatin (LIPITOR) 10 MG tablet, Take 10 mg by mouth daily. .  hydrochlorothiazide (MICROZIDE) 12.5 MG capsule, Take 12.5  mg by mouth daily. Marland Kitchen  losartan (COZAAR) 50 MG tablet, Take 50 mg by mouth daily.     Current Outpatient Medications (Other):  .  cholecalciferol (VITAMIN D3) 25 MCG (1000 UNIT) tablet, Take 1,000 Units by mouth daily. Marland Kitchen  gabapentin (NEURONTIN) 100 MG capsule, Take 2 capsules (200 mg total) by mouth at bedtime. .  TURMERIC PO, Take by mouth.   Reviewed prior external information including notes and imaging from  primary care provider As well as notes that were available from care everywhere and other healthcare systems.  Past medical history, social, surgical and family history all reviewed  in electronic medical record.  No pertanent information unless stated regarding to the chief complaint.   Review of Systems:  No headache, visual changes, nausea, vomiting, diarrhea, constipation, dizziness, abdominal pain, skin rash, fevers, chills, night sweats, weight loss, swollen lymph nodes, , joint swelling, chest pain, shortness of breath, mood changes. POSITIVE muscle aches, body aches, difficulty with ejaculation  Objective  Blood pressure 110/82, pulse 85, height 6' (1.829 m), weight 199 lb (90.3 kg), SpO2 96 %.   General: No apparent distress alert and oriented x3 mood and affect normal, dressed appropriately.  Patient though is anxious at baseline. HEENT: Pupils equal, extraocular movements intact  Respiratory: Patient's speak in full sentences and does not appear short of breath  Cardiovascular: No lower extremity edema, non tender, no erythema  Gait mild antalgic MSK:   Back exam does have some mild loss of lordosis.  Tenderness to palpation in the paraspinal musculature.  Patient does have tightness with straight leg test but worsening pain with extension of the back.  Patient does have feeling neurovascularly distally in the legs.  Left knee does have arthritic changes.  He does have arthritic changes mostly of the medial compartment.  Mild instability noted with valgus and varus stress.  Patient does have crepitus with flexion and extension.  Mild lateral tracking of the patella noted.    Impression and Recommendations:     The above documentation has been reviewed and is accurate and complete Lyndal Pulley, DO

## 2021-04-12 ENCOUNTER — Ambulatory Visit (INDEPENDENT_AMBULATORY_CARE_PROVIDER_SITE_OTHER): Payer: Medicare Other | Admitting: Family Medicine

## 2021-04-12 ENCOUNTER — Encounter: Payer: Self-pay | Admitting: Family Medicine

## 2021-04-12 ENCOUNTER — Other Ambulatory Visit: Payer: Self-pay

## 2021-04-12 VITALS — BP 110/82 | HR 85 | Ht 72.0 in | Wt 199.0 lb

## 2021-04-12 DIAGNOSIS — G8929 Other chronic pain: Secondary | ICD-10-CM | POA: Diagnosis not present

## 2021-04-12 DIAGNOSIS — M545 Low back pain, unspecified: Secondary | ICD-10-CM | POA: Diagnosis not present

## 2021-04-12 DIAGNOSIS — M1712 Unilateral primary osteoarthritis, left knee: Secondary | ICD-10-CM | POA: Diagnosis not present

## 2021-04-12 DIAGNOSIS — M5136 Other intervertebral disc degeneration, lumbar region: Secondary | ICD-10-CM

## 2021-04-12 NOTE — Patient Instructions (Addendum)
Good to see you Try to work out regularly Potentially use knee brace with yard work For the back because of some of your issues lets get MRI lumbar and pelvis See me again in 6 weeks

## 2021-04-12 NOTE — Assessment & Plan Note (Signed)
Known moderate to severe arthritic changes noted of the medial compartment of the knee.  Patient still has a discrepancy between the ultrasound as well as the x-ray.  Could consider the possibility of advanced imaging or possible injection.  Patient would like to continue with conservative therapy and will try working out on a regular basis.  Patient has been somewhat noncompliant at this moment.  Follow-up with me again in 4 to 6 weeks

## 2021-04-12 NOTE — Assessment & Plan Note (Signed)
Patient has known degenerative disc disease.  Unfortunately patient is having some worsening symptoms at this moment.  Patient is also having some difficulty with ejaculation.  He feels like it is seems to be getting worse.  Has had a past medical history significant for hypertrophy of the prostate.  He continues to this new finding I do feel advanced imaging would be warranted.  Patient could be a candidate for epidurals.  Depending on this as well may need to consider the possibility of referral to urology.  Patient was still seen a neurologist in [place] and has not established care yet for this.  Patient will follow up with me again otherwise in 4 weeks.  Patient knows if worsening symptoms such as bowel or bladder incontinence to seek medical attention immediately.

## 2021-05-04 ENCOUNTER — Ambulatory Visit
Admission: RE | Admit: 2021-05-04 | Discharge: 2021-05-04 | Disposition: A | Discharge status: Home / Self Care | Payer: Medicare Other | Source: Ambulatory Visit | Attending: Family Medicine | Admitting: Family Medicine

## 2021-05-04 DIAGNOSIS — N323 Diverticulum of bladder: Secondary | ICD-10-CM | POA: Diagnosis not present

## 2021-05-04 DIAGNOSIS — R102 Pelvic and perineal pain: Secondary | ICD-10-CM | POA: Diagnosis not present

## 2021-05-04 DIAGNOSIS — K5750 Diverticulosis of both small and large intestine without perforation or abscess without bleeding: Secondary | ICD-10-CM | POA: Diagnosis not present

## 2021-05-04 DIAGNOSIS — G8929 Other chronic pain: Secondary | ICD-10-CM

## 2021-05-04 DIAGNOSIS — M545 Low back pain, unspecified: Secondary | ICD-10-CM

## 2021-05-04 DIAGNOSIS — N400 Enlarged prostate without lower urinary tract symptoms: Secondary | ICD-10-CM | POA: Diagnosis not present

## 2021-05-04 IMAGING — MR MR LUMBAR SPINE W/O CM
5 of 6 series · 31 of 48 positions shown · non-contrast
Comparison: Radiograph [DATE]

CLINICAL DATA: Low back pain.

EXAM:
MRI LUMBAR SPINE WITHOUT CONTRAST
TECHNIQUE: Multiplanar, multisequence MR imaging of the lumbar spine was
performed. No intravenous contrast was administered.

[Series 3: T2 · sagittal · 4.0mm · 1.13mm/px · 5 of 18 slices shown (1 of 3)]
[im 1/18]
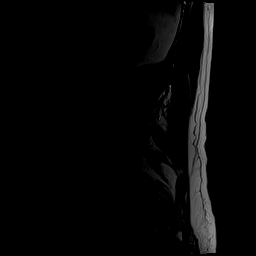
[im 5/18]
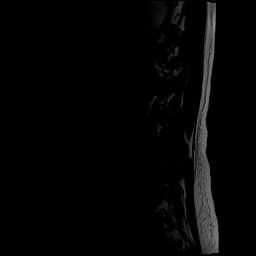
[im 9/18]
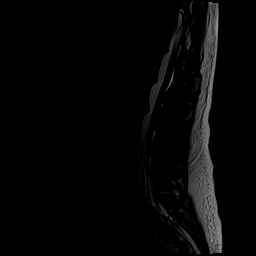
[im 13/18]
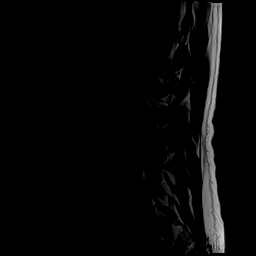
[im 18/18]
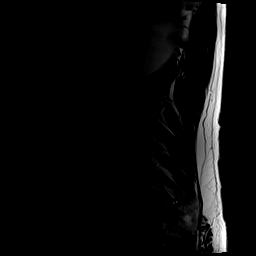

[Series 5: T1 · sagittal · 4.0mm · 1.13mm/px · 6 of 18 slices shown (1 of 2)]
[im 1/18]
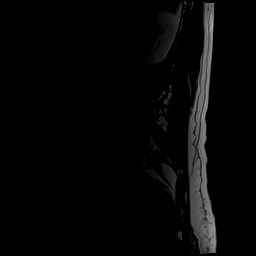
[im 4/18]
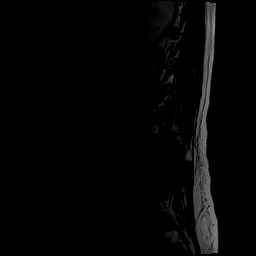
[im 7/18]
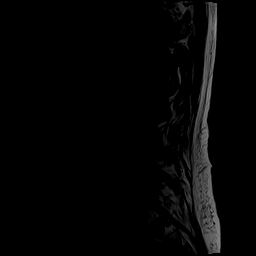
[im 11/18]
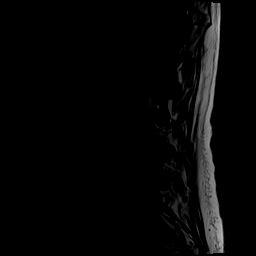
[im 14/18]
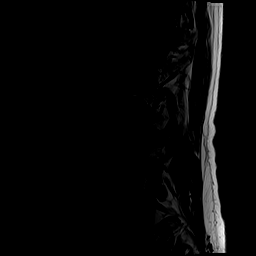
[im 18/18]
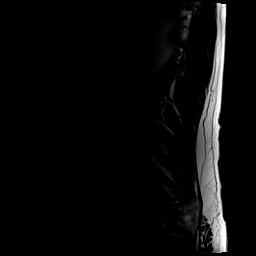

[Series 6: T2 · axial · 4.0mm · 0.39mm/px · z∈[-134,+77]mm · 9 of 42 slices shown (2 of 3)]
[im 1/42]
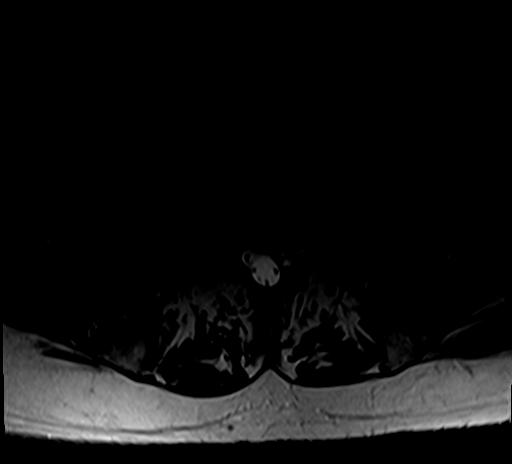
[im 7/42]
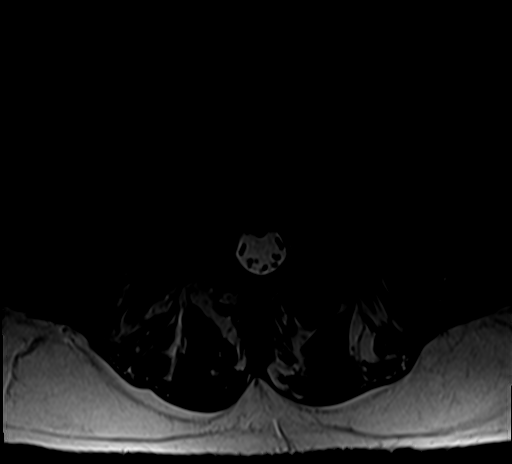
[im 14/42]
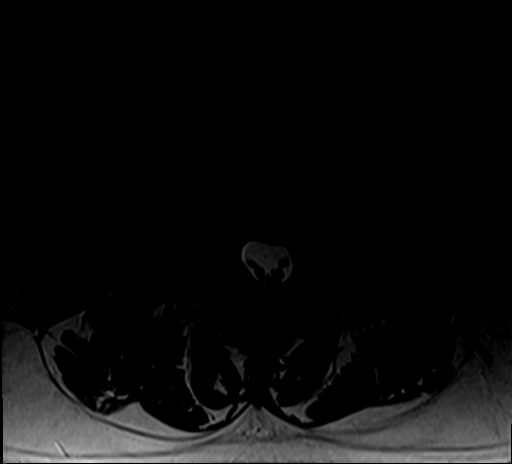
[im 18/42]
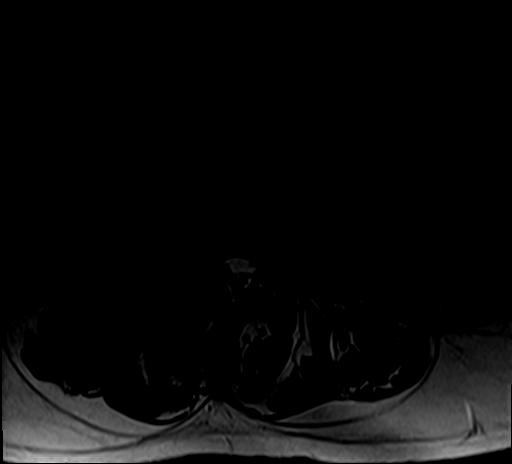
[im 21/42]
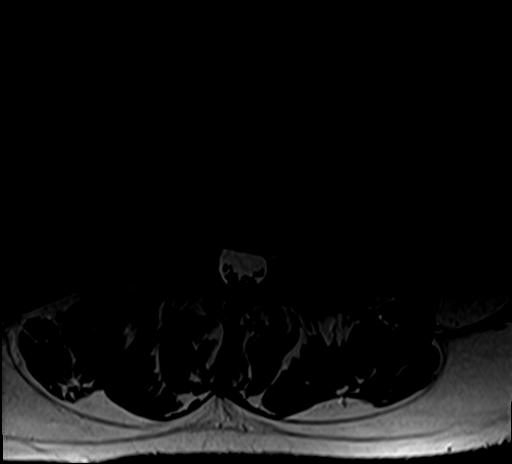
[im 24/42]
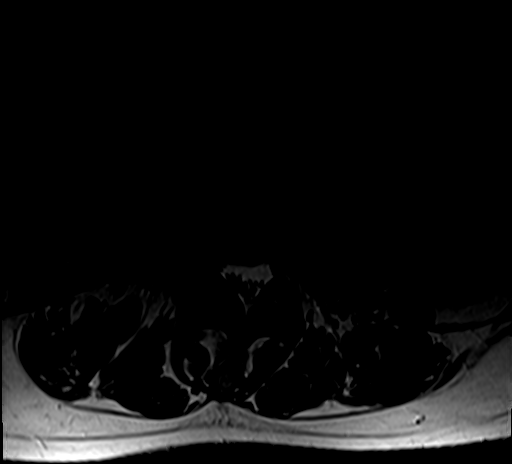
[im 28/42]
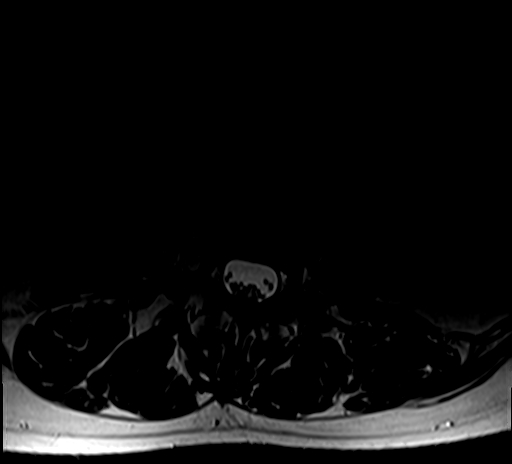
[im 35/42]
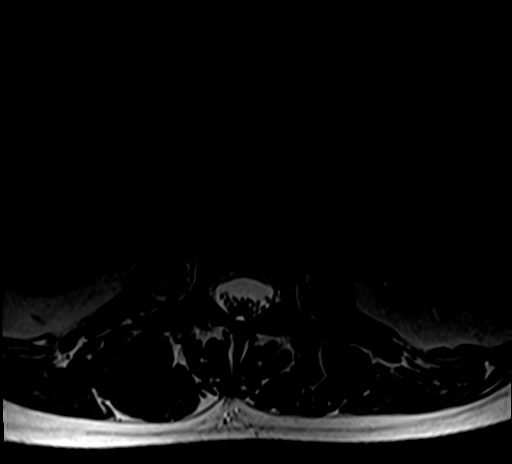
[im 42/42]
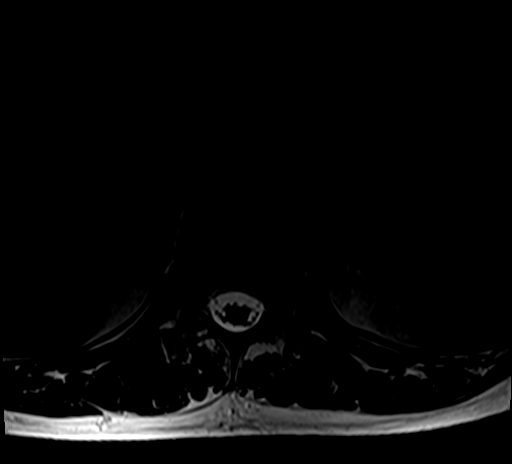

[Series 7: T1 · axial · 4.0mm · 0.39mm/px · z∈[-134,-8]mm · 5 of 42 slices shown (2 of 2)]
[im 1/42]
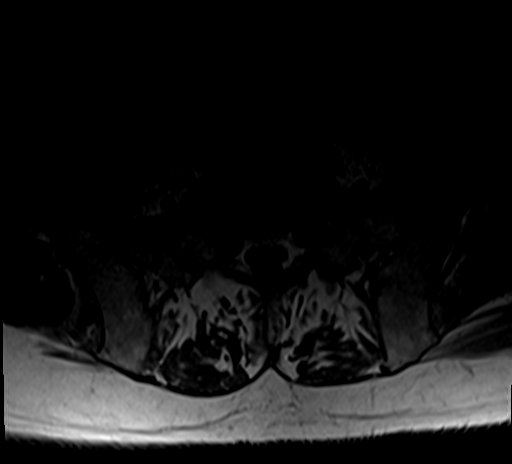
[im 7/42]
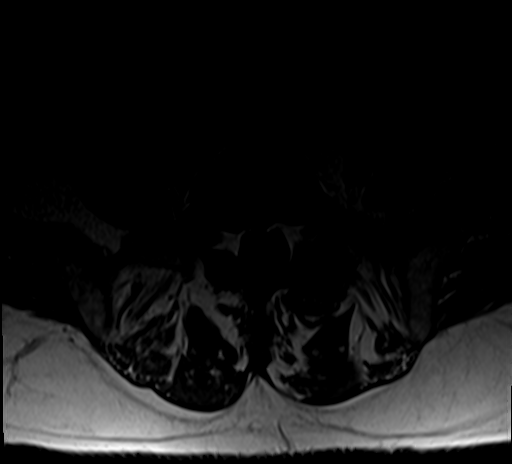
[im 14/42]
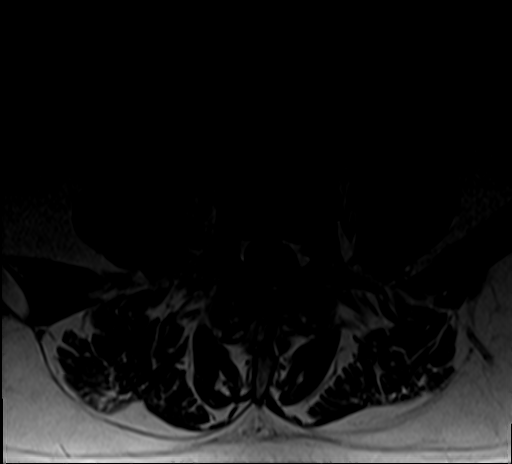
[im 18/42]
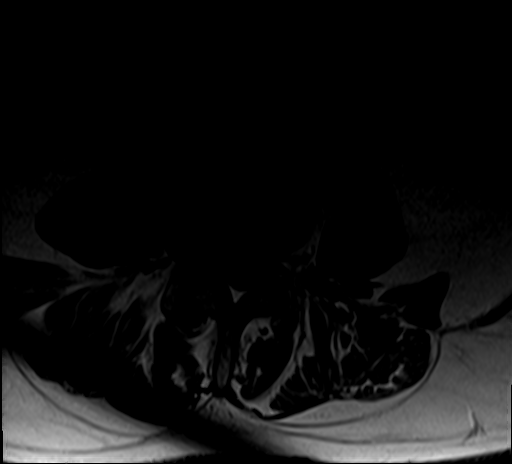
[im 24/42]
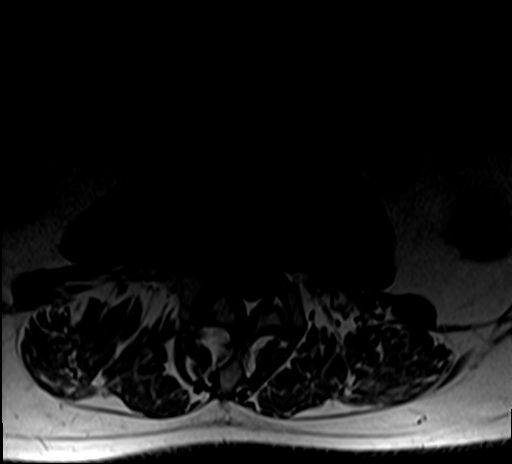

[Series 8: T2 · coronal · 4.0mm · 1.13mm/px · 6 of 19 slices shown (3 of 3)]
[im 1/19]
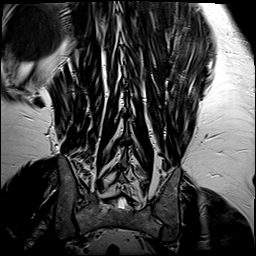
[im 4/19]
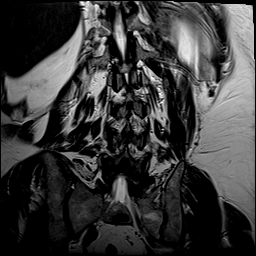
[im 8/19]
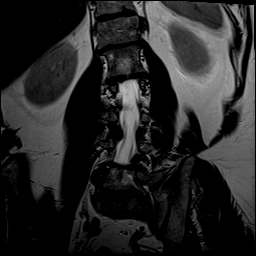
[im 11/19]
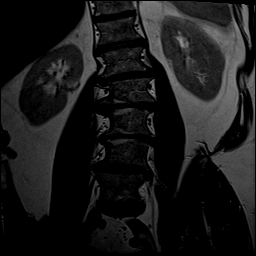
[im 15/19]
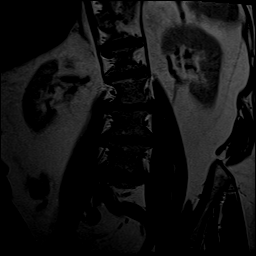
[im 19/19]
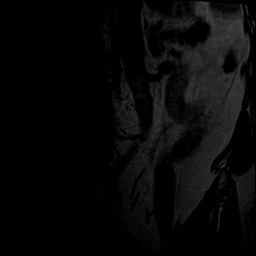

[31 of 48 positions shown; findings below may reference images not displayed]

FINDINGS: Segmentation:  Standard.

Alignment: Levoconvex scoliosis of the lumbar spine. Small
retrolisthesis at L1-2, L2-3 and L3-4.

Vertebrae: No fracture, evidence of discitis, or aggressive bone
lesion. Hemangioma at L1.

Conus medullaris and cauda equina: Conus extends to the L1-2 level.
Conus and cauda equina appear normal.

Paraspinal and other soft tissues: Negative.

Disc levels:

T12-L1: Shallow disc bulge and mild facet degenerative changes
without significant spinal canal or neural foraminal stenosis.

L1-2: Shallow disc bulge mild facet degenerative changes without
significant spinal canal or neural foraminal stenosis.

L2-3: Disc bulge with superimposed left foraminal/far lateral disc
protrusion with osteophytic component without significant spinal
canal or neural foraminal stenosis. Mild facet degenerative changes.

L3-4: Disc bulge with superimposed right foraminal/far lateral disc
protrusion with osteophytic component, moderate facet degenerative
change ligamentum flavum redundancy. Findings result in narrowing of
the right subarticular zone and mild bilateral neural foraminal
narrowing, right greater than left. No significant spinal canal
stenosis.

L4-5: Disc bulge, advanced facet degenerative changes with
ligamentum flavum redundancy resulting in narrowing of the bilateral
subarticular zones, left greater than right and mild-to-moderate
left neural foraminal narrowing.

L5-S1: Disc bulge with superimposed inferiorly migrating right
subarticular disc extrusion causing displacement of the traversing
right S1 nerve root. Moderate facet degenerative changes. Narrowing
of the right subarticular zone and mild-to-moderate bilateral neural
foraminal narrowing.
IMPRESSION: 1. Levoconvex scoliosis and spondylosis without high-grade spinal
canal stenosis.
2. Narrowing of the right subarticular zone and mild bilateral
neural foraminal narrowing at L3-4.
3. Narrowing of the bilateral subarticular zones and
mild-to-moderate left neural foraminal narrowing at L4-5.
4. Right subarticular disc extrusion at L5-S1 causing posterior
displacement of the traversing right S1 nerve root. Mild to moderate
bilateral neural foraminal narrowing at this level.

## 2021-05-04 IMAGING — MR MR PELVIS W/O CM
4 of 5 series · 32 of 48 positions shown · non-contrast
Comparison: Same day lumbar spine MRI

CLINICAL DATA: Pelvic pain and groin pain

EXAM:
MRI PELVIS WITHOUT CONTRAST
TECHNIQUE: Multiplanar multisequence MR imaging of the pelvis was performed. No
intravenous contrast was administered.

[Series 3: T2 fat-sat · sagittal · 4.0mm · 0.49mm/px · 9 of 66 slices shown]
[im 1/66]
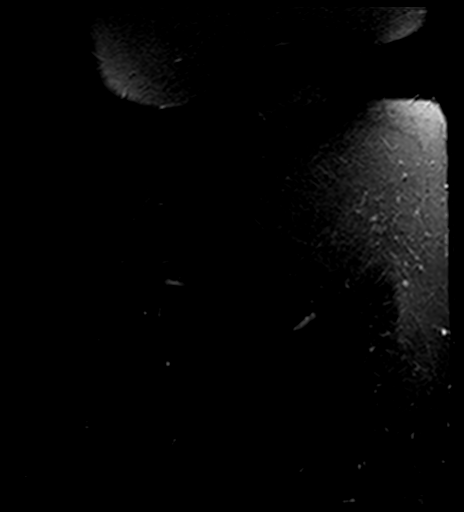
[im 12/66]
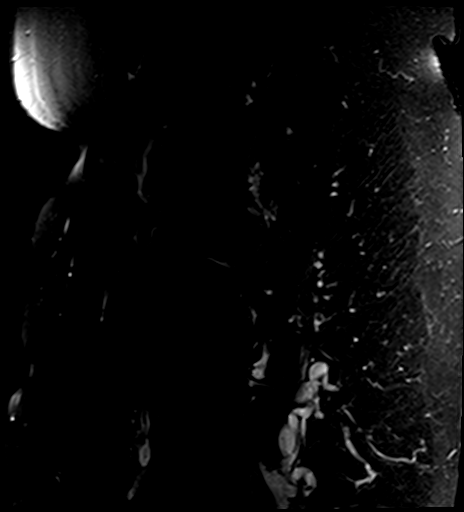
[im 18/66]
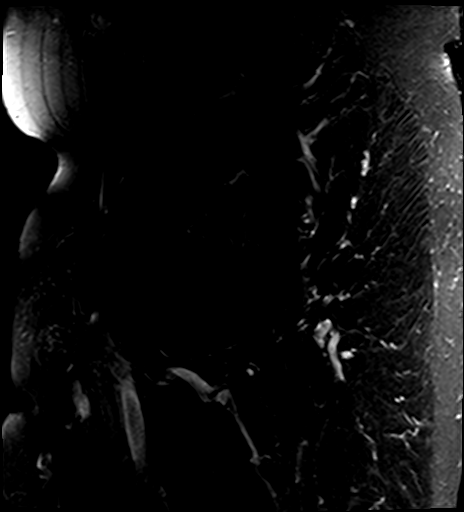
[im 30/66]
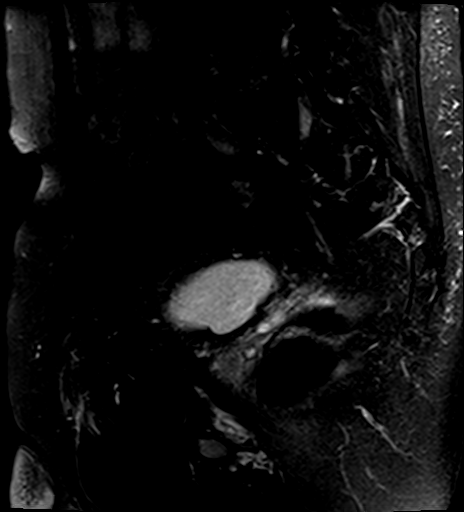
[im 36/66]
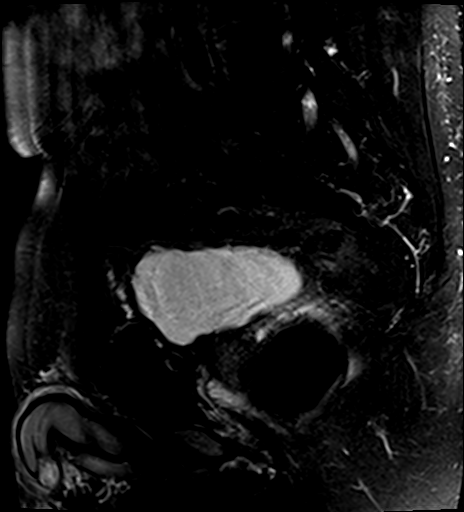
[im 48/66]
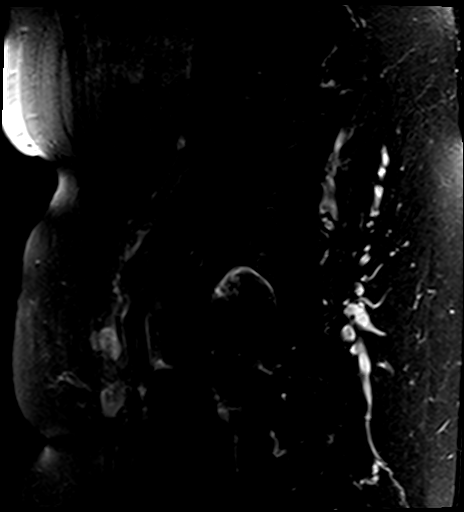
[im 54/66]
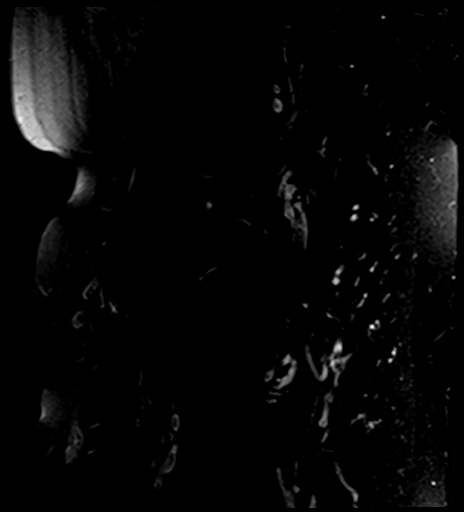
[im 60/66]
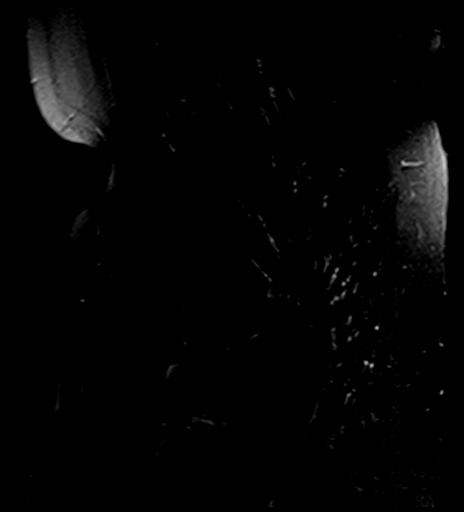
[im 66/66]
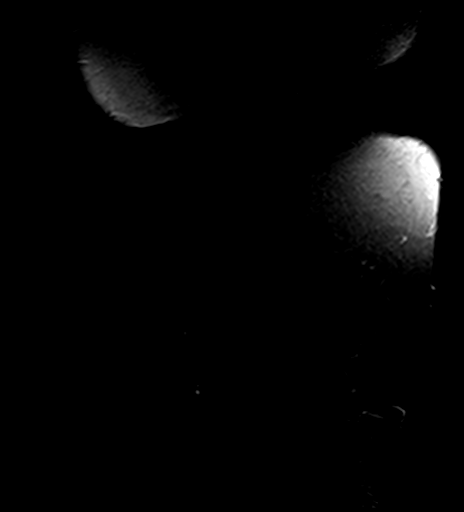

[Series 4: T1 · coronal · 4.0mm · 1.29mm/px · 8 of 39 slices shown (1 of 2)]
[im 1/39]
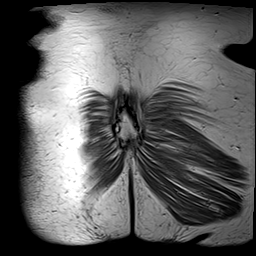
[im 6/39]
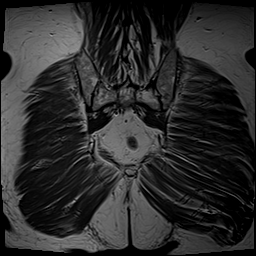
[im 11/39]
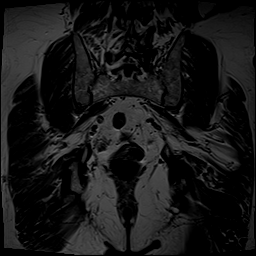
[im 17/39]
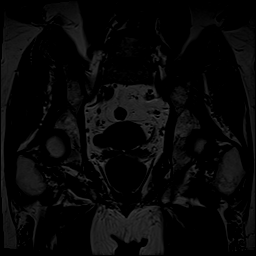
[im 22/39]
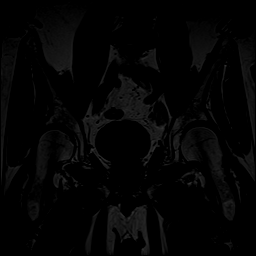
[im 28/39]
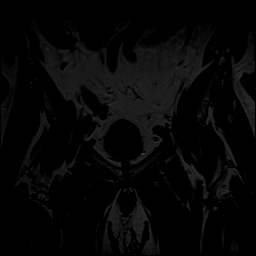
[im 33/39]
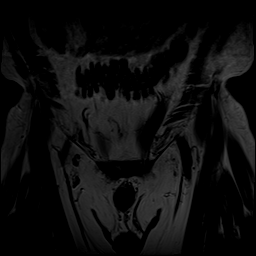
[im 39/39]
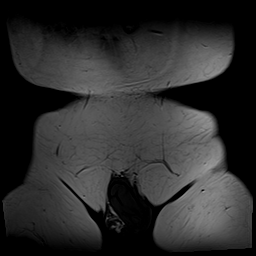

[Series 5: STIR · coronal · 4.0mm · 1.29mm/px · 8 of 39 slices shown]
[im 1/39]
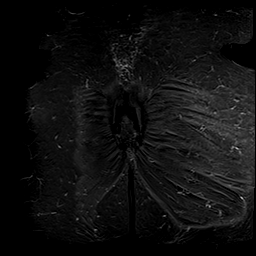
[im 6/39]
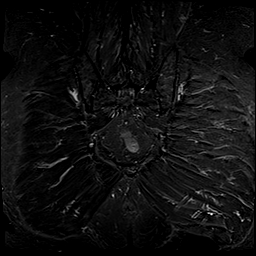
[im 11/39]
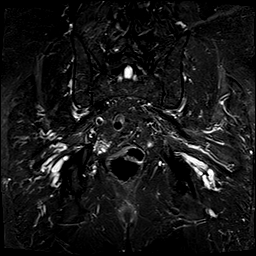
[im 17/39]
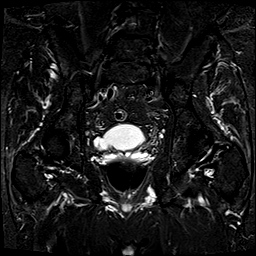
[im 22/39]
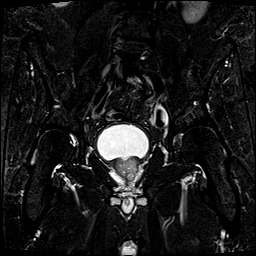
[im 28/39]
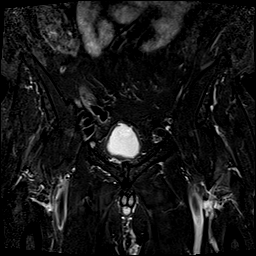
[im 33/39]
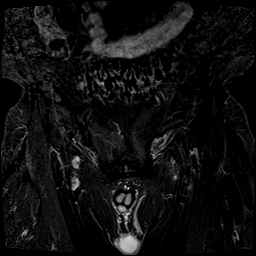
[im 39/39]
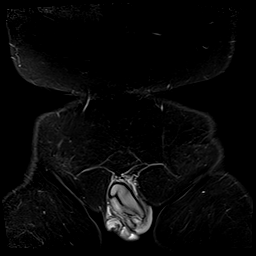

[Series 6: T1 · axial · 4.0mm · 0.64mm/px · z∈[-126,+89]mm · 7 of 50 slices shown (2 of 2)]
[im 1/50]
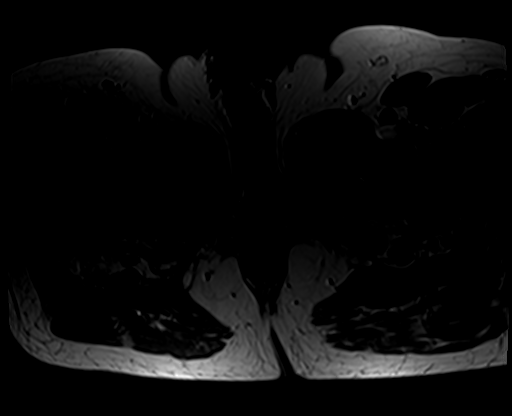
[im 6/50]
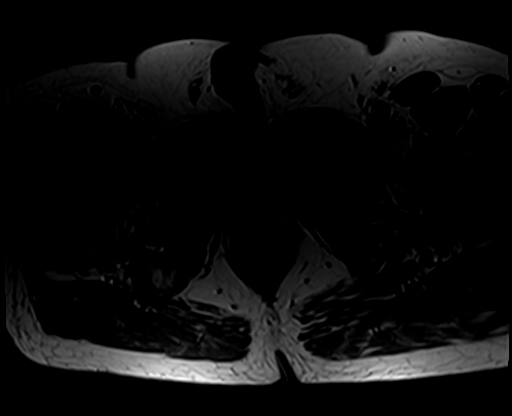
[im 17/50]
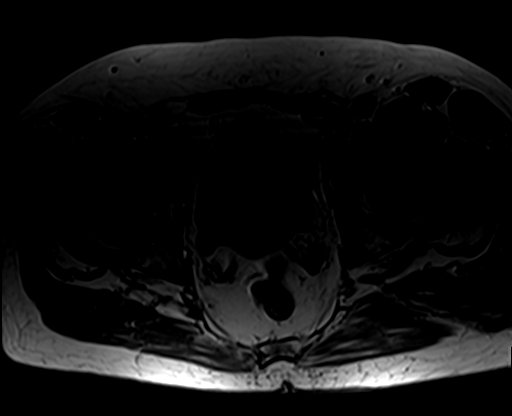
[im 22/50]
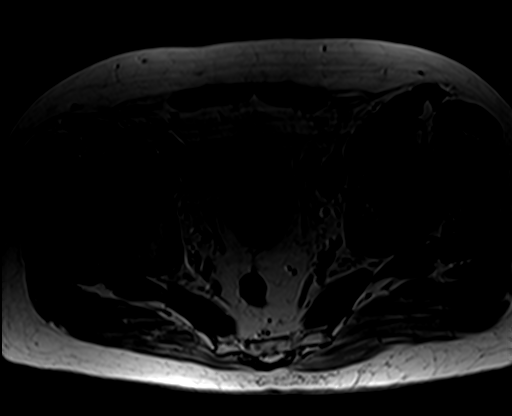
[im 28/50]
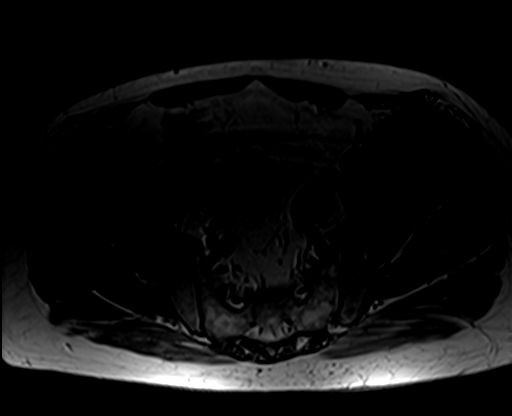
[im 33/50]
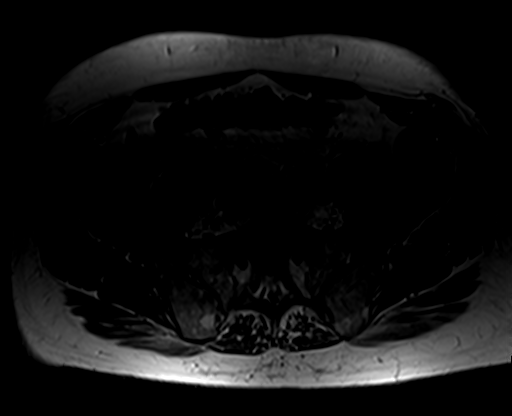
[im 44/50]
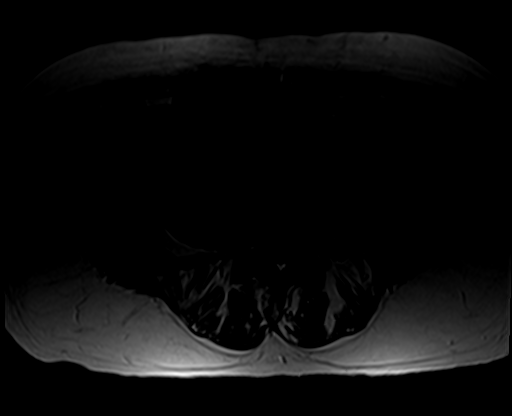

[32 of 48 positions shown; findings below may reference images not displayed]

FINDINGS: Urinary Tract: Urinary bladder wall appears circumferentially
thickened and slightly trabeculated. There is a small diverticulum
emanating from the right posterolateral wall the bladder.

Bowel: Extensive colonic diverticulosis. No acute bowel inflammation
is seen within the pelvis.

Vascular/Lymphatic: No pathologically enlarged lymph nodes. No
significant vascular abnormality seen.

Reproductive:  Mildly enlarged, heterogeneous prostate gland.

Other:  No free fluid within the pelvis.

Musculoskeletal: No acute fracture or malalignment of the pelvis. No
femoral head avascular necrosis. Mild joint space narrowing of the
bilateral hips without discrete cartilage defect. No hip joint
effusion. No paralabral cyst or abnormality. Moderate arthropathy of
the pubic symphysis. Bilateral SI joints are within normal limits.
No bone marrow edema. No marrow replacing bone lesion.

Degenerative disc disease and facet arthropathy of the visualized
lower lumbar spine, more fully characterized on dedicated same-day
lumbar spine MRI.

Mild tendinosis of the bilateral hamstring tendon origins. The
gluteal, iliopsoas, rectus femoris, and adductor tendons appear
intact without tear or significant tendinosis. No abnormal bursal
fluid collection. Normal muscle bulk and signal intensity without
edema, atrophy, or fatty infiltration.

No soft tissue edema or fluid collection.
IMPRESSION: 1. No acute osseous abnormality of the pelvis.
2. Mild bilateral hip osteoarthritis.
3. Moderate arthropathy of the pubic symphysis.
4. Mild tendinosis of the bilateral hamstring tendon origins.
5. Extensive colonic diverticulosis.
6. Mildly enlarged, heterogeneous prostate gland.
7. Urinary bladder wall appears circumferentially thickened and
slightly trabeculated. There is a small diverticulum emanating from
the right posterolateral wall the bladder. Findings are suggestive
of chronic bladder outlet obstruction.

## 2021-05-30 NOTE — Progress Notes (Deleted)
Raymond [name] [place] Phone: [phone] Subjective:    I'm seeing this patient by the request  of:  Patient, No Pcp Per (Inactive)  CC: Neck and lower back pain follow-up  PFX:[id number]  04/12/2021 Known moderate to severe arthritic changes noted of the medial compartment of the knee.  Patient still has a discrepancy between the ultrasound as well as the x-ray.  Could consider the possibility of advanced imaging or possible injecti  Patient has known degenerative disc disease.  Unfortunately patient is having some worsening symptoms at this moment.  Patient is also having some difficulty with ejaculation.  He feels like it is seems to be getting worse.  Has had a past medical history significant for hypertrophy of the prostate.  He continues to this new finding I do feel advanced imaging would be warranted.  Patient could be a candidate for epidurals.  Depending on this as well may need to consider the possibility of referral to urology.  Patient was still seen a neurologist in [place] and has not established care yet for this.  Patient will follow up with me again otherwise in 4 weeks.  Patient knows if worsening symptoms such as bowel or bladder incontinence to seek medical attention immediately.  Update 05/31/2021 Raymond Like Sr. is a 70 y.o. male coming in with complaint of low back pain.  Patient was failing conservative therapy and having worsening pain.  Was sent for MRIs of the lumbar and pelvis in June.  These were independently visualized by me.  MRI of the lumbar spine showed the patient did have a disc extrusion at L5-S1 with posterior displacement causing the right S1 nerve root impingement.  MRI of the pelvis showed mild enlargement of the prostate gland.  Patient also had mild arthritis of the hips bilaterally and moderate arthritic changes of the pubic symphysis.   Patient states   ASK IF HE IS SEEING A  UROLOGIST    No past medical history on file. No past surgical history on file. Social History   Socioeconomic History   Marital status: Married    Spouse name: Not on file   Number of children: Not on file   Years of education: Not on file   Highest education level: Not on file  Occupational History   Not on file  Tobacco Use   Smoking status: Not on file   Smokeless tobacco: Not on file  Substance and Sexual Activity   Alcohol use: Not on file   Drug use: Not on file   Sexual activity: Not on file  Other Topics Concern   Not on file  Social History Narrative   Not on file   Social Determinants of Health   Financial Resource Strain: Not on file  Food Insecurity: Not on file  Transportation Needs: Not on file  Physical Activity: Not on file  Stress: Not on file  Social Connections: Not on file   Allergies  Allergen Reactions   Penicillins Hives and Rash    Severe Rash    No family history on file.   Current Outpatient Medications (Cardiovascular):    atorvastatin (LIPITOR) 10 MG tablet, Take 10 mg by mouth daily.   hydrochlorothiazide (MICROZIDE) 12.5 MG capsule, Take 12.5 mg by mouth daily.   losartan (COZAAR) 50 MG tablet, Take 50 mg by mouth daily.     Current Outpatient Medications (Other):    cholecalciferol (VITAMIN D3) 25 MCG (1000 UNIT) tablet,  Take 1,000 Units by mouth daily.   gabapentin (NEURONTIN) 100 MG capsule, Take 2 capsules (200 mg total) by mouth at bedtime.   TURMERIC PO, Take by mouth.   Reviewed prior external information including notes and imaging from  primary care provider As well as notes that were available from care everywhere and other healthcare systems.  Past medical history, social, surgical and family history all reviewed in electronic medical record.  No pertanent information unless stated regarding to the chief complaint.   Review of Systems:  No headache, visual changes, nausea, vomiting, diarrhea, constipation,  dizziness, abdominal pain, skin rash, fevers, chills, night sweats, weight loss, swollen lymph nodes, body aches, joint swelling, chest pain, shortness of breath, mood changes. POSITIVE muscle aches  Objective  There were no vitals taken for this visit.   General: No apparent distress alert and oriented x3 mood and affect normal, dressed appropriately.  HEENT: Pupils equal, extraocular movements intact  Respiratory: Patient's speak in full sentences and does not appear short of breath  Cardiovascular: No lower extremity edema, non tender, no erythema  Gait normal with good balance and coordination.  MSK:  Non tender with full range of motion and good stability and symmetric strength and tone of shoulders, elbows, wrist, hip, knee and ankles bilaterally.     Impression and Recommendations:     The above documentation has been reviewed and is accurate and complete Raymond [name]

## 2021-05-31 ENCOUNTER — Ambulatory Visit: Payer: PRIVATE HEALTH INSURANCE | Admitting: Family Medicine

## 2021-06-02 ENCOUNTER — Ambulatory Visit: Payer: PRIVATE HEALTH INSURANCE | Admitting: Family

## 2021-06-06 ENCOUNTER — Other Ambulatory Visit: Payer: Self-pay

## 2021-06-06 ENCOUNTER — Telehealth: Payer: Self-pay | Admitting: Family Medicine

## 2021-06-06 MED ORDER — GABAPENTIN 100 MG PO CAPS: 200.0000 mg | ORAL_CAPSULE | Freq: Every day | ORAL | 0 refills | Status: DC

## 2021-06-06 NOTE — Telephone Encounter (Signed)
Refill request: gabapentin (NEURONTIN) 100 MG capsule to [facility].

## 2021-06-06 NOTE — Telephone Encounter (Signed)
Rx called in 

## 2021-06-20 ENCOUNTER — Other Ambulatory Visit: Payer: Self-pay

## 2021-06-20 ENCOUNTER — Ambulatory Visit (INDEPENDENT_AMBULATORY_CARE_PROVIDER_SITE_OTHER): Payer: Medicare Other | Admitting: Family Medicine

## 2021-06-20 VITALS — BP 136/102 | HR 70 | Ht 72.0 in | Wt 199.0 lb

## 2021-06-20 DIAGNOSIS — M5136 Other intervertebral disc degeneration, lumbar region: Secondary | ICD-10-CM | POA: Diagnosis not present

## 2021-06-20 DIAGNOSIS — N400 Enlarged prostate without lower urinary tract symptoms: Secondary | ICD-10-CM | POA: Insufficient documentation

## 2021-06-20 DIAGNOSIS — M545 Low back pain, unspecified: Secondary | ICD-10-CM | POA: Diagnosis not present

## 2021-06-20 NOTE — Patient Instructions (Signed)
Good to see you  S1 right side Nerve root injection depending on how you respond we can discus radiofrequency. [id number] is the number to call and schedule.  Once you know your schedule see me 2 weeks before your tournament

## 2021-06-20 NOTE — Assessment & Plan Note (Signed)
Patient's MRI did show findings suggestive of enlargement.  Will refer to urology for further care.

## 2021-06-20 NOTE — Progress Notes (Signed)
[place] Phone: [phone] Subjective:   [name], am serving as a scribe for Dr. Hulan Saas. This visit occurred during the SARS-CoV-2 public health emergency.  Safety protocols were in place, including screening questions prior to the visit, additional usage of staff PPE, and extensive cleaning of exam room while observing appropriate contact time as indicated for disinfecting solutions.   I'm seeing this patient by the request  of:  Patient, No Pcp Per (Inactive)  CC: low back and left knee pain   [id number]  04/12/2021 Known moderate to severe arthritic changes noted of the medial compartment of the knee.  Patient still has a discrepancy between the ultrasound as well as the x-ray.  Could consider the possibility of advanced imaging or possible injection.  Patient would like to continue with conservative therapy and will try working out on a regular basis.  Patient has been somewhat noncompliant at this moment.  Follow-up with me again in 4 to 6 weeks  Patient has known degenerative disc disease.  Unfortunately patient is having some worsening symptoms at this moment.  Patient is also having some difficulty with ejaculation.  He feels like it is seems to be getting worse.  Has had a past medical history significant for hypertrophy of the prostate.  He continues to this new finding I do feel advanced imaging would be warranted.  Patient could be a candidate for epidurals.  Depending on this as well may need to consider the possibility of referral to urology.  Patient was still seen a neurologist in [place] and has not established care yet for this.  Patient will follow up with me again otherwise in 4 weeks.  Patient knows if worsening symptoms such as bowel or bladder incontinence to seek medical attention immediately.  Update 06/20/2021 Raymond Like Sr. is a 70 y.o. male coming in with complaint of LBP  and L knee pain. Patient states that he cut back on playing tennis. Pain has improved in both areas but he feels that is because he is not playing tennis. Knee can hurt at rest. Used gabapentin and wonders if he can stop taking it.   Patient did have MRIs of the lumbar spine and the pelvis done.  Patient was found to have enlargement of the prostate as well as some mild chronic changes of the bladder secondary to obstruction.  Patient has not been seen by urologist for 5 years.  Patient's MRI of the lumbar spine though did show that there is a possibility of right S1 nerve root impingement.  This could be consistent with some of the different symptoms patient is having at this time.     No past medical history on file. No past surgical history on file. Social History   Socioeconomic History   Marital status: Married    Spouse name: Not on file   Number of children: Not on file   Years of education: Not on file   Highest education level: Not on file  Occupational History   Not on file  Tobacco Use   Smoking status: Not on file   Smokeless tobacco: Not on file  Substance and Sexual Activity   Alcohol use: Not on file   Drug use: Not on file   Sexual activity: Not on file  Other Topics Concern   Not on file  Social History Narrative   Not on file   Social Determinants of Health  Financial Resource Strain: Not on file  Food Insecurity: Not on file  Transportation Needs: Not on file  Physical Activity: Not on file  Stress: Not on file  Social Connections: Not on file   Allergies  Allergen Reactions   Penicillins Hives and Rash    Severe Rash    No family history on file.   Current Outpatient Medications (Cardiovascular):    atorvastatin (LIPITOR) 10 MG tablet, Take 10 mg by mouth daily.   hydrochlorothiazide (MICROZIDE) 12.5 MG capsule, Take 12.5 mg by mouth daily.   losartan (COZAAR) 50 MG tablet, Take 50 mg by mouth daily.     Current Outpatient Medications  (Other):    cholecalciferol (VITAMIN D3) 25 MCG (1000 UNIT) tablet, Take 1,000 Units by mouth daily.   gabapentin (NEURONTIN) 100 MG capsule, Take 2 capsules (200 mg total) by mouth at bedtime.   TURMERIC PO, Take by mouth.   Reviewed prior external information including notes and imaging from  primary care provider As well as notes that were available from care everywhere and other healthcare systems.  Past medical history, social, surgical and family history all reviewed in electronic medical record.  No pertanent information unless stated regarding to the chief complaint.   Review of Systems:  No headache, visual changes, nausea, vomiting, diarrhea, constipation, dizziness, abdominal pain, skin rash, fevers, chills, night sweats, weight loss, swollen lymph nodes, body aches, joint swelling, chest pain, shortness of breath, mood changes. POSITIVE muscle aches  Objective  Blood pressure (!) 136/102, pulse 70, height 6' (1.829 m), weight 199 lb (90.3 kg), SpO2 95 %.   General: No apparent distress alert and oriented x3 mood and affect normal, dressed appropriately.  HEENT: Pupils equal, extraocular movements intact  Respiratory: Patient's speak in full sentences and does not appear short of breath  Cardiovascular: No lower extremity edema, non tender, no erythema  Gait normal with good balance and coordination.  MSK: Low back exam does have some mild loss of lordosis.  Somewhat uncomfortable sitting and had to change positions multiple times.  Neurovascularly intact distally.  Some positive radicular symptoms with the straight leg test on the right side.  Neurovascularly intact distally ago. Left knee exam shows the patient does have some tenderness to palpation patient does have relatively good stability at the moment.    Impression and Recommendations:     The above documentation has been reviewed and is accurate and complete Raymond Pulley, DO

## 2021-06-20 NOTE — Assessment & Plan Note (Signed)
Do believe that the right-sided S1 nerve root impingement could be contributing to more the radicular symptoms and tightness noted on the right side.  We will have patient get a nerve root injection depending on how patient responds.  Could also be a candidate for the possibility of radiofrequency ablation.  Hopefully patient will make some improvement.  Patient was having difficulty with gabapentin and can titrate off.  We will see the neurologist for his other problems.  Follow-up with me again in 4 to 6 weeks after the injection to see how patient responds

## 2021-06-22 ENCOUNTER — Encounter: Payer: Self-pay | Admitting: Family Medicine

## 2021-06-23 ENCOUNTER — Other Ambulatory Visit: Payer: Self-pay

## 2021-06-23 DIAGNOSIS — N400 Enlarged prostate without lower urinary tract symptoms: Secondary | ICD-10-CM

## 2021-07-14 ENCOUNTER — Ambulatory Visit (INDEPENDENT_AMBULATORY_CARE_PROVIDER_SITE_OTHER): Payer: Medicare Other | Admitting: Family

## 2021-07-14 ENCOUNTER — Encounter: Payer: Self-pay | Admitting: Family

## 2021-07-14 ENCOUNTER — Other Ambulatory Visit: Payer: Self-pay

## 2021-07-14 VITALS — BP 150/76 | HR 82 | Temp 97.9°F | Ht 72.0 in | Wt 194.4 lb

## 2021-07-14 DIAGNOSIS — M5030 Other cervical disc degeneration, unspecified cervical region: Secondary | ICD-10-CM | POA: Diagnosis not present

## 2021-07-14 DIAGNOSIS — N400 Enlarged prostate without lower urinary tract symptoms: Secondary | ICD-10-CM

## 2021-07-14 DIAGNOSIS — I10 Essential (primary) hypertension: Secondary | ICD-10-CM | POA: Diagnosis not present

## 2021-07-14 DIAGNOSIS — E785 Hyperlipidemia, unspecified: Secondary | ICD-10-CM

## 2021-07-14 MED ORDER — LOSARTAN POTASSIUM 50 MG PO TABS: 50.0000 mg | ORAL_TABLET | Freq: Every day | ORAL | 1 refills | Status: DC

## 2021-07-14 MED ORDER — HYDROCHLOROTHIAZIDE 12.5 MG PO CAPS: 12.5000 mg | ORAL_CAPSULE | Freq: Every day | ORAL | 1 refills | Status: DC

## 2021-07-14 MED ORDER — ATORVASTATIN CALCIUM 10 MG PO TABS: 10.0000 mg | ORAL_TABLET | Freq: Every day | ORAL | 1 refills | Status: DC

## 2021-07-14 NOTE — Progress Notes (Signed)
Raymond Gallis Sr. is a 70 y.o. male with the following history as recorded in EpicCare:  Patient Active Problem List   Diagnosis Date Noted   Prostate enlargement 06/20/2021   Degenerative disc disease, cervical 12/03/2020   Degenerative lumbar disc 09/27/2020   Degenerative arthritis of left knee 09/27/2020    Current Outpatient Medications  Medication Sig Dispense Refill   cholecalciferol (VITAMIN D3) 25 MCG (1000 UNIT) tablet Take 1,000 Units by mouth daily.     gabapentin (NEURONTIN) 100 MG capsule Take 2 capsules (200 mg total) by mouth at bedtime. 180 capsule 0   hydrocortisone cream 1 % Apply 1 application topically 2 (two) times daily.     atorvastatin (LIPITOR) 10 MG tablet Take 1 tablet (10 mg total) by mouth daily. 90 tablet 1   hydrochlorothiazide (MICROZIDE) 12.5 MG capsule Take 1 capsule (12.5 mg total) by mouth daily. 90 capsule 1   losartan (COZAAR) 50 MG tablet Take 1 tablet (50 mg total) by mouth daily. 90 tablet 1   No current facility-administered medications for this visit.    Allergies: Penicillins  Past Medical History:  Diagnosis Date   GERD (gastroesophageal reflux disease)    Hyperlipidemia    Hypertension     Past Surgical History:  Procedure Laterality Date   EYE SURGERY      Family History  Problem Relation Age of Onset   Alcohol abuse Mother    Arthritis Mother    Diabetes Mother    Mental retardation Mother    Alcohol abuse Father    Hypertension Father    Cancer Sister    Depression Sister    Cancer Sister    Arthritis Maternal Grandmother    Parkinson's disease Maternal Grandfather     Social History   Tobacco Use   Smoking status: Not on file   Smokeless tobacco: Not on file  Substance Use Topics   Alcohol use: Not on file    Subjective:   Patient presents today as a new patient; history of hypertension/ hyperlipidemia; Transferring from [place]; will be getting records transferred; Working with sports medicine- Dr.  Tamala Julian for chronic low back pain; Planning to follow up with [facility] regarding increased prostate size;   Admits that stress level has been extremely high in the past 2 weeks- wife is currently recovering from sepsis; has been able to start to plan for rehab options/ outpatient care options.       Objective:  Vitals:   07/14/21 1008  BP: (!) 150/76  Pulse: 82  Temp: 97.9 F (36.6 C)  TempSrc: Oral  SpO2: 97%  Weight: 194 lb 6.4 oz (88.2 kg)  Height: 6' (1.829 m)    General: Well developed, well nourished, in no acute distress  Skin : Warm and dry.  Head: Normocephalic and atraumatic  Lungs: Respirations unlabored; clear to auscultation bilaterally without wheeze, rales, rhonchi  CVS exam: normal rate and regular rhythm.  Neurologic: Alert and oriented; speech intact; face symmetrical; moves all extremities well; CNII-XII intact without focal deficit   Assessment:  1. Primary hypertension   2. Hyperlipidemia, unspecified hyperlipidemia type   3. Degenerative disc disease, cervical   4. Prostate enlargement     Plan:  Suspect elevated today due to recent personal stressors; will re-check in 1 month; Refill updated on Atorvastatin;  Continue with sports medicine as scheduled; He has been referred to [facility] and will be scheduling a follow up.   This visit occurred during the SARS-CoV-2  public health emergency.  Safety protocols were in place, including screening questions prior to the visit, additional usage of staff PPE, and extensive cleaning of exam room while observing appropriate contact time as indicated for disinfecting solutions.    Return in about 1 month (around 08/13/2021) for CPE/ lab appointment a few days prior.  No orders of the defined types were placed in this encounter.   Requested Prescriptions   Signed Prescriptions Disp Refills   atorvastatin (LIPITOR) 10 MG tablet 90 tablet 1    Sig: Take 1 tablet (10 mg total) by mouth daily.    hydrochlorothiazide (MICROZIDE) 12.5 MG capsule 90 capsule 1    Sig: Take 1 capsule (12.5 mg total) by mouth daily.   losartan (COZAAR) 50 MG tablet 90 tablet 1    Sig: Take 1 tablet (50 mg total) by mouth daily.

## 2021-08-16 ENCOUNTER — Telehealth: Payer: Self-pay

## 2021-08-16 NOTE — Telephone Encounter (Signed)
Pt is scheduled to come in for future labs for his CPE on 08/18/21,but orders are not in for him to do labs prior to his CPE on 08/25/21.

## 2021-08-17 ENCOUNTER — Other Ambulatory Visit: Payer: Self-pay | Admitting: Family

## 2021-08-17 DIAGNOSIS — E785 Hyperlipidemia, unspecified: Secondary | ICD-10-CM

## 2021-08-17 DIAGNOSIS — I10 Essential (primary) hypertension: Secondary | ICD-10-CM

## 2021-08-18 ENCOUNTER — Other Ambulatory Visit: Payer: Self-pay

## 2021-08-18 ENCOUNTER — Other Ambulatory Visit (INDEPENDENT_AMBULATORY_CARE_PROVIDER_SITE_OTHER): Payer: Medicare Other

## 2021-08-18 DIAGNOSIS — I10 Essential (primary) hypertension: Secondary | ICD-10-CM

## 2021-08-18 DIAGNOSIS — E785 Hyperlipidemia, unspecified: Secondary | ICD-10-CM | POA: Diagnosis not present

## 2021-08-18 LAB — COMPREHENSIVE METABOLIC PANEL
ALT: 20 U/L (ref 0–53)
AST: 22 U/L (ref 0–37)
Albumin: 4 g/dL (ref 3.5–5.2)
Alkaline Phosphatase: 101 U/L (ref 39–117)
BUN: 14 mg/dL (ref 6–23)
CO2: 31 mEq/L (ref 19–32)
Calcium: 9.6 mg/dL (ref 8.4–10.5)
Chloride: 101 mEq/L (ref 96–112)
Creatinine, Ser: 0.95 mg/dL (ref 0.40–1.50)
GFR: 80.02 mL/min (ref >60.00)
Glucose, Bld: 100 mg/dL — ABNORMAL HIGH (ref 70–99)
Potassium: 4 mEq/L (ref 3.5–5.1)
Sodium: 138 mEq/L (ref 135–145)
Total Bilirubin: 1.6 mg/dL — ABNORMAL HIGH (ref 0.2–1.2)
Total Protein: 6.6 g/dL (ref 6.0–8.3)

## 2021-08-18 LAB — LIPID PANEL
Cholesterol: 143 mg/dL (ref 0–200)
HDL: 56.2 mg/dL (ref >39.00)
LDL Cholesterol: 72 mg/dL (ref 0–99)
NonHDL: 86.48
Total CHOL/HDL Ratio: 3
Triglycerides: 71 mg/dL (ref 0.0–149.0)
VLDL: 14.2 mg/dL (ref 0.0–40.0)

## 2021-08-18 LAB — CBC WITH DIFFERENTIAL/PLATELET
Basophils Absolute: 0 10*3/uL (ref 0.0–0.1)
Basophils Relative: 0.5 % (ref 0.0–3.0)
Eosinophils Absolute: 0.1 10*3/uL (ref 0.0–0.7)
Eosinophils Relative: 1.6 % (ref 0.0–5.0)
HCT: 45.4 % (ref 39.0–52.0)
Hemoglobin: 15 g/dL (ref 13.0–17.0)
Lymphocytes Relative: 42.1 % (ref 12.0–46.0)
Lymphs Abs: 3.2 10*3/uL (ref 0.7–4.0)
MCHC: 33.1 g/dL (ref 30.0–36.0)
MCV: 96.7 fl (ref 78.0–100.0)
Monocytes Absolute: 0.7 10*3/uL (ref 0.1–1.0)
Monocytes Relative: 8.6 % (ref 3.0–12.0)
Neutro Abs: 3.6 10*3/uL (ref 1.4–7.7)
Neutrophils Relative %: 47.2 % (ref 43.0–77.0)
Platelets: 237 10*3/uL (ref 150.0–400.0)
RBC: 4.7 Mil/uL (ref 4.22–5.81)
RDW: 13.2 % (ref 11.5–15.5)
WBC: 7.7 10*3/uL (ref 4.0–10.5)

## 2021-08-25 ENCOUNTER — Ambulatory Visit: Payer: Medicare Other | Admitting: Family

## 2021-08-25 DIAGNOSIS — Z85828 Personal history of other malignant neoplasm of skin: Secondary | ICD-10-CM | POA: Diagnosis not present

## 2021-08-25 DIAGNOSIS — L570 Actinic keratosis: Secondary | ICD-10-CM | POA: Diagnosis not present

## 2021-08-25 DIAGNOSIS — L814 Other melanin hyperpigmentation: Secondary | ICD-10-CM | POA: Diagnosis not present

## 2021-08-25 DIAGNOSIS — L821 Other seborrheic keratosis: Secondary | ICD-10-CM | POA: Diagnosis not present

## 2021-08-25 DIAGNOSIS — D225 Melanocytic nevi of trunk: Secondary | ICD-10-CM | POA: Diagnosis not present

## 2021-08-25 DIAGNOSIS — D1801 Hemangioma of skin and subcutaneous tissue: Secondary | ICD-10-CM | POA: Diagnosis not present

## 2021-08-25 DIAGNOSIS — D3617 Benign neoplasm of peripheral nerves and autonomic nervous system of trunk, unspecified: Secondary | ICD-10-CM | POA: Diagnosis not present

## 2021-08-29 ENCOUNTER — Telehealth: Payer: Self-pay | Admitting: Family Medicine

## 2021-08-29 ENCOUNTER — Other Ambulatory Visit: Payer: Self-pay

## 2021-08-29 NOTE — Telephone Encounter (Signed)
Pharmacy Request: gabapentin (NEURONTIN) 100 MG capsule  [facility]

## 2021-08-30 ENCOUNTER — Ambulatory Visit (INDEPENDENT_AMBULATORY_CARE_PROVIDER_SITE_OTHER): Payer: Medicare Other | Admitting: Family

## 2021-08-30 ENCOUNTER — Other Ambulatory Visit: Payer: Self-pay | Admitting: Family

## 2021-08-30 ENCOUNTER — Other Ambulatory Visit: Payer: Self-pay

## 2021-08-30 VITALS — BP 140/80 | HR 90 | Temp 97.5°F | Ht 71.0 in | Wt 185.0 lb

## 2021-08-30 DIAGNOSIS — I10 Essential (primary) hypertension: Secondary | ICD-10-CM | POA: Diagnosis not present

## 2021-08-30 DIAGNOSIS — E785 Hyperlipidemia, unspecified: Secondary | ICD-10-CM | POA: Diagnosis not present

## 2021-08-30 DIAGNOSIS — Z23 Encounter for immunization: Secondary | ICD-10-CM | POA: Diagnosis not present

## 2021-08-30 DIAGNOSIS — Z0000 Encounter for general adult medical examination without abnormal findings: Secondary | ICD-10-CM

## 2021-08-30 DIAGNOSIS — N400 Enlarged prostate without lower urinary tract symptoms: Secondary | ICD-10-CM | POA: Diagnosis not present

## 2021-08-30 MED ORDER — GABAPENTIN 100 MG PO CAPS: 200.0000 mg | ORAL_CAPSULE | Freq: Every day | ORAL | 0 refills | Status: DC

## 2021-08-30 NOTE — Patient Instructions (Signed)
Please start drinking 1-2 Ensure daily to help with protein/ calorie intake;

## 2021-08-30 NOTE — Telephone Encounter (Signed)
Rx refilled.

## 2021-08-30 NOTE — Progress Notes (Signed)
Raymond Baehr Sr. is a 70 y.o. male with the following history as recorded in EpicCare:  Patient Active Problem List   Diagnosis Date Noted   Prostate enlargement 06/20/2021   Degenerative disc disease, cervical 12/03/2020   Degenerative lumbar disc 09/27/2020   Degenerative arthritis of left knee 09/27/2020    Current Outpatient Medications  Medication Sig Dispense Refill   atorvastatin (LIPITOR) 10 MG tablet Take 1 tablet (10 mg total) by mouth daily. 90 tablet 1   cholecalciferol (VITAMIN D3) 25 MCG (1000 UNIT) tablet Take 1,000 Units by mouth daily.     gabapentin (NEURONTIN) 100 MG capsule Take 2 capsules (200 mg total) by mouth at bedtime. 180 capsule 0   hydrochlorothiazide (MICROZIDE) 12.5 MG capsule Take 1 capsule (12.5 mg total) by mouth daily. 90 capsule 1   hydrocortisone cream 1 % Apply 1 application topically 2 (two) times daily.     losartan (COZAAR) 50 MG tablet Take 1 tablet (50 mg total) by mouth daily. 90 tablet 1   No current facility-administered medications for this visit.    Allergies: Penicillins  Past Medical History:  Diagnosis Date   GERD (gastroesophageal reflux disease)    Hyperlipidemia    Hypertension     Past Surgical History:  Procedure Laterality Date   EYE SURGERY      Family History  Problem Relation Age of Onset   Alcohol abuse Mother    Arthritis Mother    Diabetes Mother    Mental retardation Mother    Alcohol abuse Father    Hypertension Father    Cancer Sister    Depression Sister    Cancer Sister    Arthritis Maternal Grandmother    Parkinson's disease Maternal Grandfather     Social History   Tobacco Use   Smoking status: Not on file   Smokeless tobacco: Not on file  Substance Use Topics   Alcohol use: Not on file    Subjective:  Presents for yearly CPE: unfortunately his wife passed away a week after his last office visit here; he is struggling with her loss and some financial concerns that have developed since  her death; admits he is not eating regularly; feels like his sleep is improving slightly and does not want to take medication; Labs were done previously;   Review of Systems  Constitutional:  Positive for weight loss.  HENT: Negative.    Eyes: Negative.   Respiratory: Negative.    Cardiovascular: Negative.   Gastrointestinal: Negative.   Genitourinary: Negative.   Musculoskeletal: Negative.   Skin: Negative.   Neurological: Negative.   Endo/Heme/Allergies: Negative.   Psychiatric/Behavioral:  Positive for depression. The patient is nervous/anxious and has insomnia.     Objective:  Vitals:   08/30/21 1507  BP: 140/80  Pulse: 90  Temp: (!) 97.5 F (36.4 C)  TempSrc: Oral  SpO2: 97%  Weight: 185 lb (83.9 kg)  Height: 5\' 11"  (1.803 m)    General: Well developed, well nourished, in no acute distress  Skin : Warm and dry.  Head: Normocephalic and atraumatic  Eyes: Sclera and conjunctiva clear; pupils round and reactive to light; extraocular movements intact  Ears: External normal; canals clear; tympanic membranes normal  Oropharynx: Pink, supple. No suspicious lesions  Neck: Supple without thyromegaly, adenopathy  Lungs: Respirations unlabored; clear to auscultation bilaterally without wheeze, rales, rhonchi  CVS exam: normal rate and regular rhythm.  Neurologic: Alert and oriented; speech intact; face symmetrical; moves all extremities well; CNII-XII intact  without focal deficit   Assessment:  1. PE (physical exam), annual   2. Need for immunization against influenza   3. Primary hypertension   4. Hyperlipidemia, unspecified hyperlipidemia type   5. Prostate enlargement     Plan:  Encouraged to start drinking Ensure/ Boost to help get extra calories/ protein; flu shot updated;  Reviewed labs done earlier this month and reviewed; continue same medications; Follow up in 1 month to evaluate weight; he will also consider reaching out to therapist to help with grief  counseling; encouraged to follow up with his urologist;   This visit occurred during the SARS-CoV-2 public health emergency.  Safety protocols were in place, including screening questions prior to the visit, additional usage of staff PPE, and extensive cleaning of exam room while observing appropriate contact time as indicated for disinfecting solutions.    Return in about 1 month (around 09/30/2021) for follow up/ weight check.  Orders Placed This Encounter  Procedures   Flu Vaccine QUAD High Dose(Fluad)    Requested Prescriptions    No prescriptions requested or ordered in this encounter

## 2021-09-07 DIAGNOSIS — Z961 Presence of intraocular lens: Secondary | ICD-10-CM | POA: Diagnosis not present

## 2021-09-07 DIAGNOSIS — H35033 Hypertensive retinopathy, bilateral: Secondary | ICD-10-CM | POA: Diagnosis not present

## 2021-09-07 DIAGNOSIS — H35362 Drusen (degenerative) of macula, left eye: Secondary | ICD-10-CM | POA: Diagnosis not present

## 2021-09-07 DIAGNOSIS — H11823 Conjunctivochalasis, bilateral: Secondary | ICD-10-CM | POA: Diagnosis not present

## 2021-09-07 DIAGNOSIS — D3132 Benign neoplasm of left choroid: Secondary | ICD-10-CM | POA: Diagnosis not present

## 2021-10-04 ENCOUNTER — Encounter: Payer: Self-pay | Admitting: Family

## 2021-10-04 ENCOUNTER — Other Ambulatory Visit: Payer: Self-pay

## 2021-10-04 ENCOUNTER — Ambulatory Visit (INDEPENDENT_AMBULATORY_CARE_PROVIDER_SITE_OTHER): Payer: Medicare Other | Admitting: Family

## 2021-10-04 VITALS — BP 130/70 | HR 84 | Temp 98.6°F | Ht 71.0 in | Wt 182.6 lb

## 2021-10-04 DIAGNOSIS — T148XXA Other injury of unspecified body region, initial encounter: Secondary | ICD-10-CM | POA: Diagnosis not present

## 2021-10-04 DIAGNOSIS — F4321 Adjustment disorder with depressed mood: Secondary | ICD-10-CM

## 2021-10-04 MED ORDER — MUPIROCIN 2 % EX OINT: 1.0000 "application " | TOPICAL_OINTMENT | Freq: Two times a day (BID) | CUTANEOUS | 0 refills | Status: DC

## 2021-10-04 MED ORDER — SULFAMETHOXAZOLE-TRIMETHOPRIM 800-160 MG PO TABS: 1.0000 | ORAL_TABLET | Freq: Two times a day (BID) | ORAL | 0 refills | Status: DC

## 2021-10-04 NOTE — Progress Notes (Signed)
Raymond Glockner Sr. is a 70 y.o. male with the following history as recorded in Epic[employer]:  Patient Active Problem List   Diagnosis Date Noted   Prostate enlargement 06/20/2021   Degenerative disc disease, cervical 12/03/2020   Degenerative lumbar disc 09/27/2020   Degenerative arthritis of left knee 09/27/2020    Current Outpatient Medications  Medication Sig Dispense Refill   atorvastatin (LIPITOR) 10 MG tablet Take 1 tablet (10 mg total) by mouth daily. 90 tablet 1   gabapentin (NEURONTIN) 100 MG capsule Take 2 capsules (200 mg total) by mouth at bedtime. (Patient taking differently: Take 100 mg by mouth at bedtime.) 180 capsule 0   hydrochlorothiazide (MICROZIDE) 12.5 MG capsule Take 1 capsule (12.5 mg total) by mouth daily. 90 capsule 1   hydrocortisone cream 1 % Apply 1 application topically 2 (two) times daily.     losartan (COZAAR) 50 MG tablet Take 1 tablet (50 mg total) by mouth daily. 90 tablet 1   mupirocin ointment (BACTROBAN) 2 % Apply 1 application topically 2 (two) times daily. 22 g 0   sulfamethoxazole-trimethoprim (BACTRIM DS) 800-160 MG tablet Take 1 tablet by mouth 2 (two) times daily. 10 tablet 0   cholecalciferol (VITAMIN D3) 25 MCG (1000 UNIT) tablet Take 1,000 Units by mouth daily. (Patient not taking: Reported on 10/04/2021)     No current facility-administered medications for this visit.    Allergies: Penicillins  Past Medical History:  Diagnosis Date   GERD (gastroesophageal reflux disease)    Hyperlipidemia    Hypertension     Past Surgical History:  Procedure Laterality Date   EYE SURGERY      Family History  Problem Relation Age of Onset   Alcohol abuse Mother    Arthritis Mother    Diabetes Mother    Mental retardation Mother    Alcohol abuse Father    Hypertension Father    Cancer Sister    Depression Sister    Cancer Sister    Arthritis Maternal Grandmother    Parkinson's disease Maternal Grandfather     Social History   Tobacco  Use   Smoking status: Never   Smokeless tobacco: Not on file  Substance Use Topics   Alcohol use: Not on file    Subjective:  1 month follow up on weight/ grief reaction;  Feels like he is doing slightly better- has plans to start drinking protein shakes; is also planning to reach out to a therapist as well; Is exercising daily- walking at least one mile per day;   Notes that he tripped while walking down his steps earlier this week; has a cut on the outer side of left hand; has been using Hydrogen Peroxide and topical Neosporin; does feel it is getting better;     Objective:  Vitals:   10/04/21 1056  BP: 130/70  Pulse: 84  Temp: 98.6 F (37 C)  TempSrc: Oral  SpO2: 96%  Weight: 182 lb 9.6 oz (82.8 kg)  Height: 5\' 11"  (1.803 m)    General: Well developed, well nourished, in no acute distress  Skin : Warm and dry. Scabbed lesion noted on outer left hand Head: Normocephalic and atraumatic  Lungs: Respirations unlabored;  Neurologic: Alert and oriented; speech intact; face symmetrical; moves all extremities well; CNII-XII intact without focal deficit   Assessment:  1. Grief reaction   2. Abrasion     Plan:  Encouraged to work on increased calories/ protein drinks; he plans to establish with therapist; he defers  medication today but notes he will re-consider if continued complicated grief reaction; patient wants to do 3 month re-check; Rx for Bactroban apply bid to affected area; if no improvement in 48 hours, start oral antibiotics;   This visit occurred during the SARS-CoV-2 public health emergency.  Safety protocols were in place, including screening questions prior to the visit, additional usage of staff PPE, and extensive cleaning of exam room while observing appropriate contact time as indicated for disinfecting solutions.    Return in about 3 months (around 01/04/2022).  No orders of the defined types were placed in this encounter.   Requested Prescriptions   Signed  Prescriptions Disp Refills   mupirocin ointment (BACTROBAN) 2 % 22 g 0    Sig: Apply 1 application topically 2 (two) times daily.   sulfamethoxazole-trimethoprim (BACTRIM DS) 800-160 MG tablet 10 tablet 0    Sig: Take 1 tablet by mouth 2 (two) times daily.

## 2021-10-18 ENCOUNTER — Ambulatory Visit: Payer: Medicare Other | Admitting: Family

## 2021-11-06 ENCOUNTER — Emergency Department (HOSPITAL_BASED_OUTPATIENT_CLINIC_OR_DEPARTMENT_OTHER): Payer: Medicare Other

## 2021-11-06 ENCOUNTER — Other Ambulatory Visit: Payer: Self-pay

## 2021-11-06 ENCOUNTER — Emergency Department (HOSPITAL_BASED_OUTPATIENT_CLINIC_OR_DEPARTMENT_OTHER)
Admission: EM | Admit: 2021-11-06 | Discharge: 2021-11-06 | Disposition: A | Discharge status: Home / Self Care | Payer: Medicare Other | Attending: Emergency Medicine | Admitting: Emergency Medicine

## 2021-11-06 ENCOUNTER — Encounter (HOSPITAL_BASED_OUTPATIENT_CLINIC_OR_DEPARTMENT_OTHER): Payer: Self-pay | Admitting: *Deleted

## 2021-11-06 DIAGNOSIS — I82512 Chronic embolism and thrombosis of left femoral vein: Secondary | ICD-10-CM | POA: Diagnosis not present

## 2021-11-06 DIAGNOSIS — I10 Essential (primary) hypertension: Secondary | ICD-10-CM | POA: Diagnosis not present

## 2021-11-06 DIAGNOSIS — I82402 Acute embolism and thrombosis of unspecified deep veins of left lower extremity: Secondary | ICD-10-CM | POA: Insufficient documentation

## 2021-11-06 DIAGNOSIS — I824Y2 Acute embolism and thrombosis of unspecified deep veins of left proximal lower extremity: Secondary | ICD-10-CM

## 2021-11-06 DIAGNOSIS — M79662 Pain in left lower leg: Secondary | ICD-10-CM | POA: Diagnosis present

## 2021-11-06 DIAGNOSIS — Z79899 Other long term (current) drug therapy: Secondary | ICD-10-CM | POA: Insufficient documentation

## 2021-11-06 IMAGING — US US EXTREM LOW VENOUS*L*
1 series · 13 of 24 positions shown · non-contrast
Comparison: None.
COMPARISON: None.

Addendum:
CLINICAL DATA: Left leg pain/swelling x1 day

EXAM:
LEFT LOWER EXTREMITY VENOUS DOPPLER ULTRASOUND
TECHNIQUE: Gray-scale sonography with compression, as well as color and duplex
ultrasound, were performed to evaluate the deep venous system(s)
from the level of the common femoral vein through the popliteal and
proximal calf veins.

[Series 1: us extrem low venous*left* · 13 of 43 slices shown]
[im 1/43]
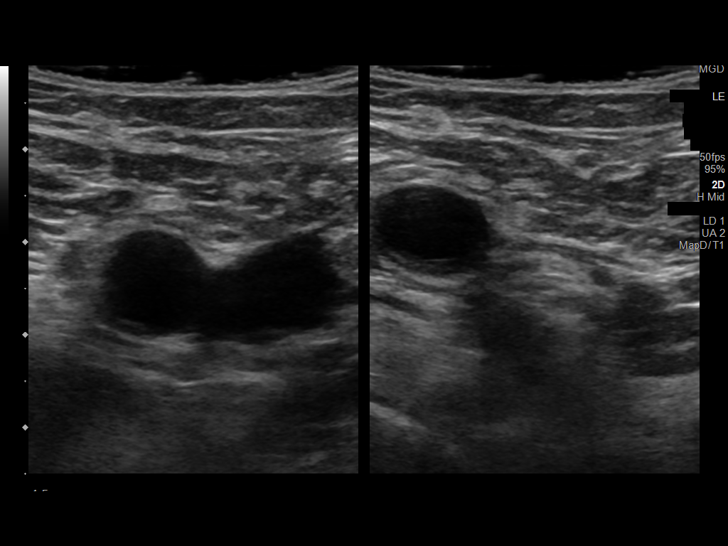
[im 4/43]
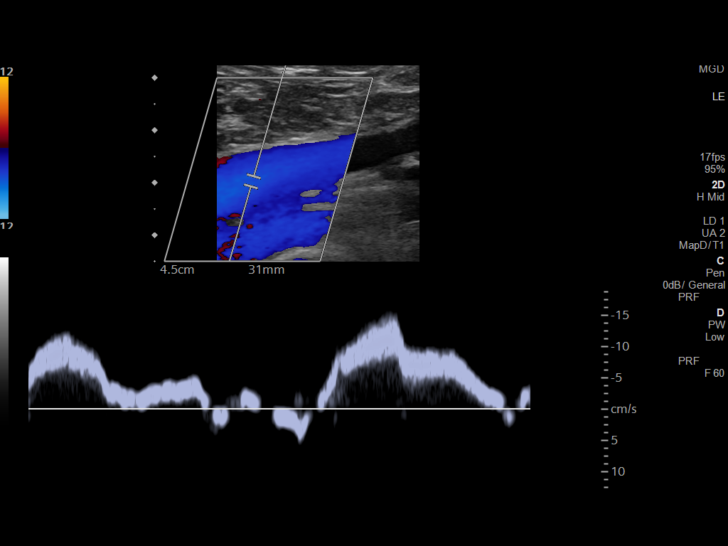
[im 8/43]
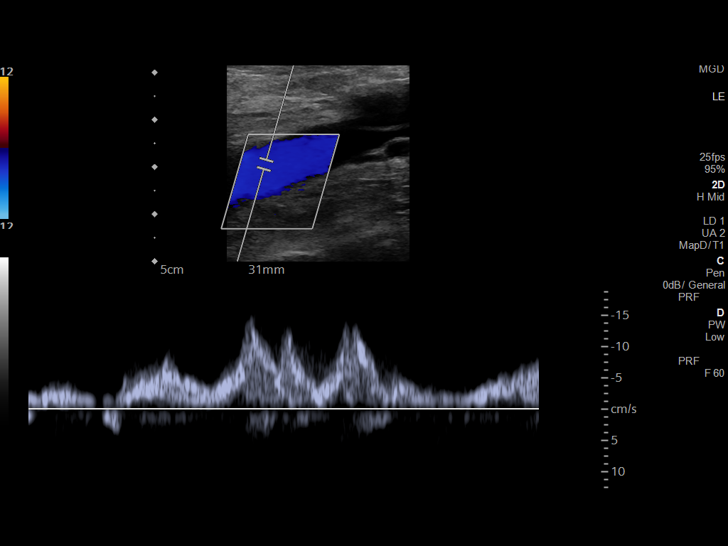
[im 11/43]
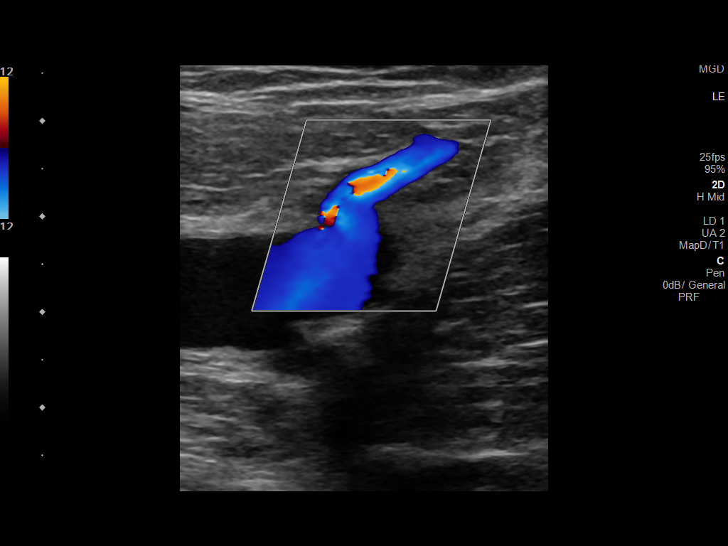
[im 15/43]
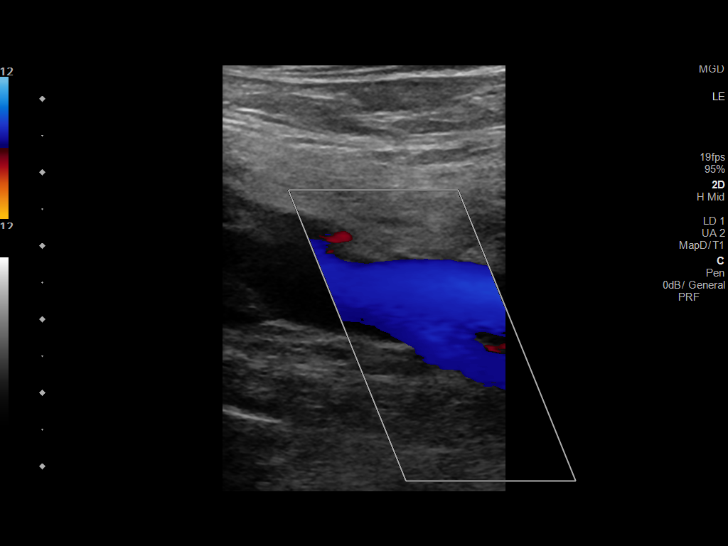
[im 19/43]
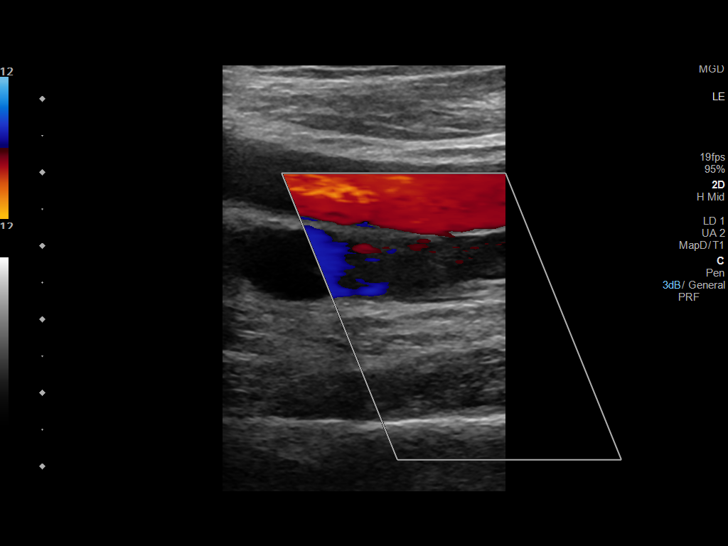
[im 22/43]
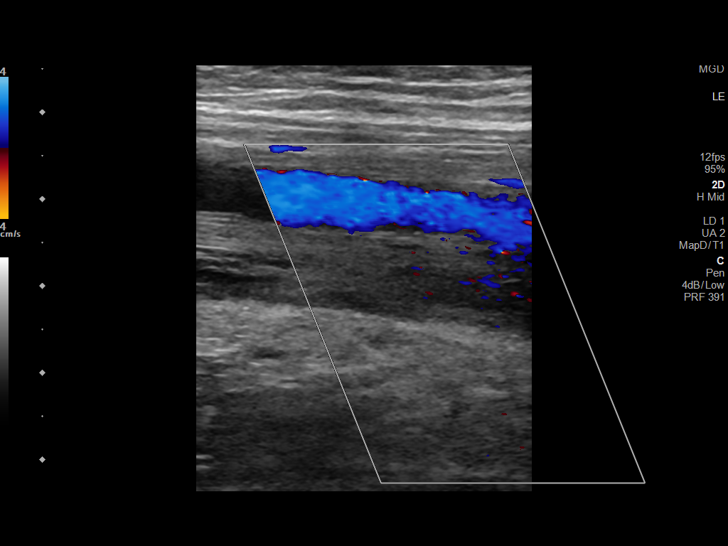
[im 24/43]
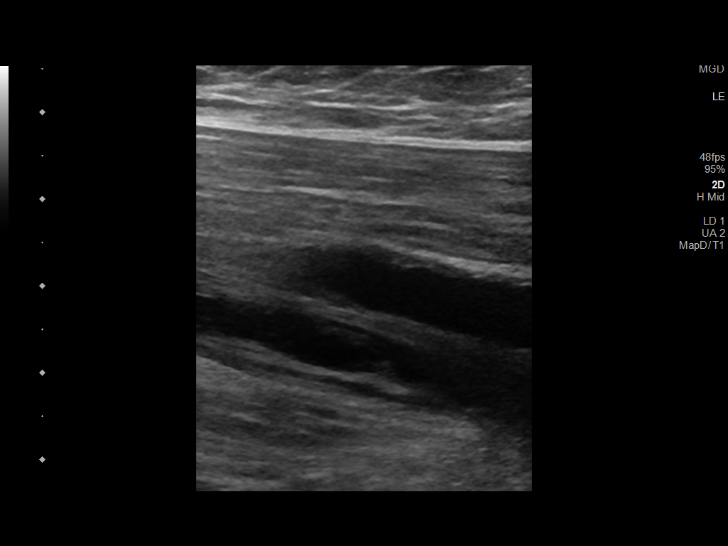
[im 28/43]
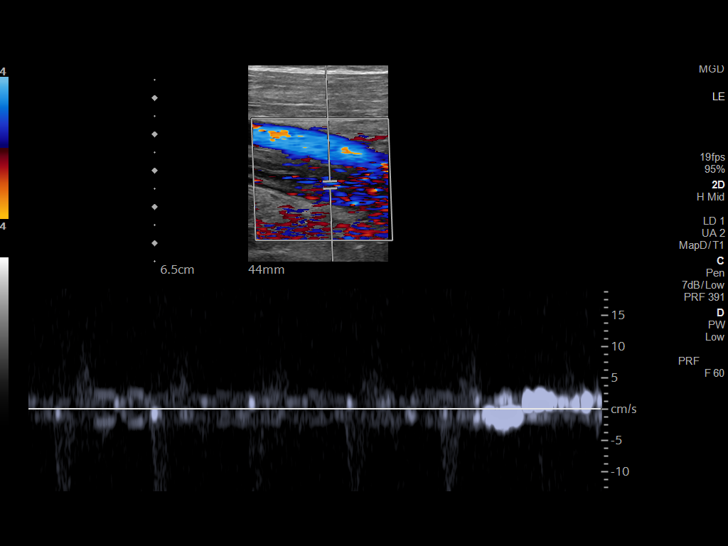
[im 32/43]
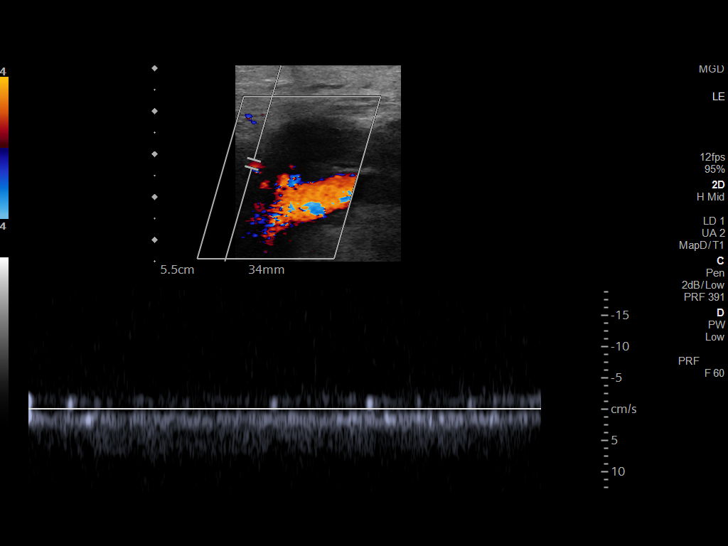
[im 35/43]
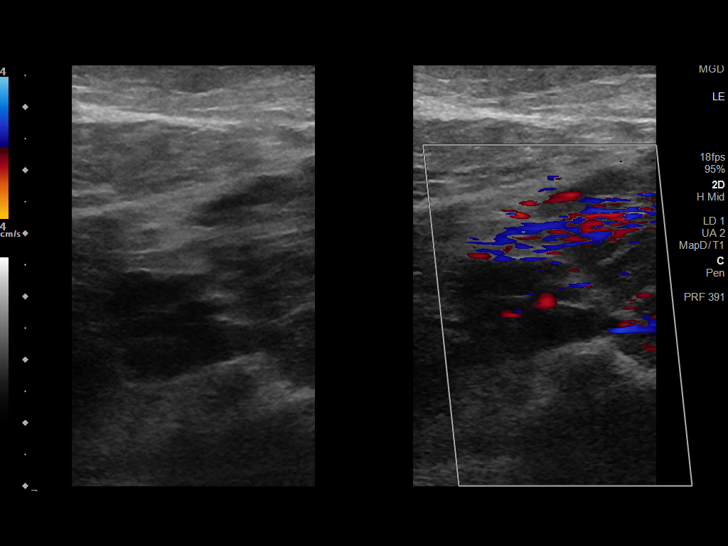
[im 39/43]
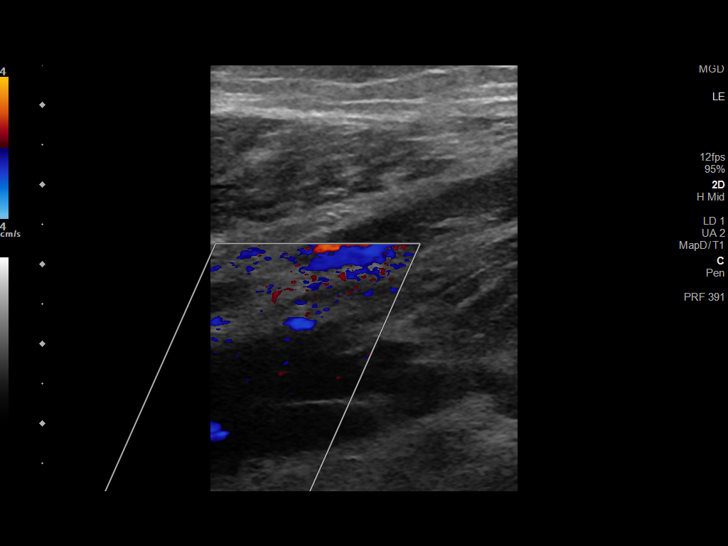
[im 43/43]
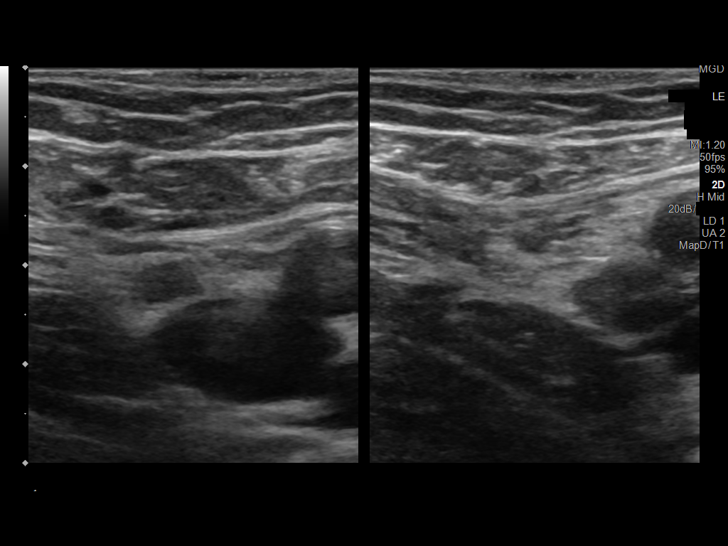

[13 of 24 positions shown; findings below may reference images not displayed]

FINDINGS: VENOUS

Thrombus extending from the femoral vein to the visualized calf
veins on the left.

Left greater saphenous vein is patent.

Limited views of the contralateral common femoral vein are
unremarkable.

OTHER

Mild subcutaneous edema in the calf.

Limitations: none
IMPRESSION: Positive for deep venous thrombosis extending from the femoral vein
to the visualized calf veins on the left.

ADDENDUM:
Addendum for further clarification. Thrombus is largely occlusive
from the calf to the level of the proximal femoral vein. The left
common femoral vein, GSV/CFV junction, and GSV remain patent at this
time.

This was discussed with Dr. HENDRIANSYAH on [DATE] at [US] hours.

*** End of Addendum ***
FINDINGS: VENOUS

Thrombus extending from the femoral vein to the visualized calf
veins on the left.

Left greater saphenous vein is patent.

Limited views of the contralateral common femoral vein are
unremarkable.

OTHER

Mild subcutaneous edema in the calf.

Limitations: none
IMPRESSION: Positive for deep venous thrombosis extending from the femoral vein
to the visualized calf veins on the left.

## 2021-11-06 MED ORDER — APIXABAN (ELIQUIS) VTE STARTER PACK (10MG AND 5MG): ORAL_TABLET | ORAL | 0 refills | Status: DC

## 2021-11-06 MED ORDER — APIXABAN 2.5 MG PO TABS
10.0000 mg | ORAL_TABLET | Freq: Once | ORAL | Status: AC
  Administered 2021-11-06: 12:00:00 10 mg via ORAL
  Filled 2021-11-06: qty 4

## 2021-11-06 NOTE — Discharge Instructions (Addendum)
You have a blood clot in your left lower leg.  You will be started on apixaban which is a blood thinner to treat this.  Follow-up with your primary care doctor.  Follow prescription directions.  Now that you are starting a blood thinner any trauma can cause fair amount of bleeding.  You could also have some spontaneous bleeding as well.  Most important thing is to be aware of any mild head trauma you may need evaluation for head injury.  Overall these medications are safe and will help prevent blood clot from travel into your lungs.  Please return if symptoms worsen.  You have been given your first dose of blood thinner this morning.  You will need to take your next dose tonight before bedtime.

## 2021-11-06 NOTE — ED Provider Notes (Signed)
[facility] EMERGENCY DEPARTMENT Provider Note   CSN: [id number] Arrival date & time: 11/06/21  1016     History Chief Complaint  Patient presents with   Leg Pain    Raymond Dani Sr. is a 70 y.o. male.  The history is provided by the patient.  Leg Pain Location:  Leg (left calf pain and swelling last few days) Pain details:    Quality:  Aching   Radiates to:  Does not radiate   Severity:  Mild   Onset quality:  Gradual   Timing:  Constant   Progression:  Worsening Chronicity:  New Relieved by:  Nothing Worsened by:  Bearing weight Associated symptoms: swelling   Associated symptoms: no back pain, no decreased ROM, no fatigue, no fever, no itching, no muscle weakness, no neck pain, no numbness, no stiffness and no tingling       Past Medical History:  Diagnosis Date   GERD (gastroesophageal reflux disease)    Hyperlipidemia    Hypertension     Patient Active Problem List   Diagnosis Date Noted   Prostate enlargement 06/20/2021   Degenerative disc disease, cervical 12/03/2020   Degenerative lumbar disc 09/27/2020   Degenerative arthritis of left knee 09/27/2020    Past Surgical History:  Procedure Laterality Date   EYE SURGERY         Family History  Problem Relation Age of Onset   Alcohol abuse Mother    Arthritis Mother    Diabetes Mother    Mental retardation Mother    Alcohol abuse Father    Hypertension Father    Cancer Sister    Depression Sister    Cancer Sister    Arthritis Maternal Grandmother    Parkinson's disease Maternal Grandfather     Social History   Tobacco Use   Smoking status: Never    Home Medications Prior to Admission medications   Medication Sig Start Date End Date Taking? Authorizing Provider  APIXABAN Arne Cleveland) VTE STARTER PACK (10MG  AND 5MG ) Take as directed on package: start with two-5mg  tablets twice daily for 7 days. On day 8, switch to one-5mg  tablet twice daily. 11/06/21  Yes Reeta Kuk,  Khylin Gutridge, DO  atorvastatin (LIPITOR) 10 MG tablet Take 1 tablet (10 mg total) by mouth daily. 07/14/21   Marrian Salvage, FNP  cholecalciferol (VITAMIN D3) 25 MCG (1000 UNIT) tablet Take 1,000 Units by mouth daily. Patient not taking: Reported on 10/04/2021    [provider]  gabapentin (NEURONTIN) 100 MG capsule Take 2 capsules (200 mg total) by mouth at bedtime. Patient taking differently: Take 100 mg by mouth at bedtime. 08/30/21   Lyndal Pulley, DO  hydrochlorothiazide (MICROZIDE) 12.5 MG capsule Take 1 capsule (12.5 mg total) by mouth daily. 07/14/21   Marrian Salvage, FNP  hydrocortisone cream 1 % Apply 1 application topically 2 (two) times daily.    [provider]  losartan (COZAAR) 50 MG tablet Take 1 tablet (50 mg total) by mouth daily. 07/14/21   Marrian Salvage, FNP  mupirocin ointment (BACTROBAN) 2 % Apply 1 application topically 2 (two) times daily. 10/04/21   Marrian Salvage, FNP  sulfamethoxazole-trimethoprim (BACTRIM DS) 800-160 MG tablet Take 1 tablet by mouth 2 (two) times daily. 10/04/21   Marrian Salvage, FNP    Allergies    Penicillins  Review of Systems   Review of Systems  Constitutional:  Negative for fatigue and fever.  Musculoskeletal:  Negative for back pain, neck pain  and stiffness.  Skin:  Negative for itching.   Physical Exam Updated Vital Signs  ED Triage Vitals  Enc Vitals Group     BP 11/06/21 1025 (!) 143/88     Pulse Rate 11/06/21 1025 92     Resp 11/06/21 1025 18     Temp 11/06/21 1025 97.6 F (36.4 C)     Temp Source 11/06/21 1026 Oral     SpO2 11/06/21 1025 98 %     Weight 11/06/21 1024 185 lb (83.9 kg)     Height 11/06/21 1024 5\' 11"  (1.803 m)     Head Circumference --      Peak Flow --      Pain Score 11/06/21 1024 4     Pain Loc --      Pain Edu? --      Excl. in [place]? --     Physical Exam Constitutional:      General: He is not in acute distress.    Appearance: He is not  ill-appearing.  Cardiovascular:     Pulses: Normal pulses.     Heart sounds: Normal heart sounds.  Musculoskeletal:        General: Swelling and tenderness (TTP to left calf) present. Normal range of motion.     Right lower leg: No edema.     Left lower leg: Edema (1+) present.  Skin:    Capillary Refill: Capillary refill takes less than 2 seconds.     Findings: No erythema or rash.  Neurological:     General: No focal deficit present.     Mental Status: He is alert.     Sensory: No sensory deficit.     Motor: No weakness.    ED Results / Procedures / Treatments   Labs (all labs ordered are listed, but only abnormal results are displayed) Labs Reviewed - No data to display  EKG None  Radiology US Venous Img Lower  Left (DVT Study)  Addendum Date: 11/06/2021   ADDENDUM REPORT: 11/06/2021 11:30 ADDENDUM: Addendum for further clarification. Thrombus is largely occlusive from the calf to the level of the proximal femoral vein. The left common femoral vein, GSV/CFV junction, and GSV remain patent at this time. This was discussed with Dr. Ronnald Nian on 11/06/2021 at 1125 hours. Electronically Signed   By: Julian Hy M.D.   On: 11/06/2021 11:30   Result Date: 11/06/2021 CLINICAL DATA:  Left leg pain/swelling x1 day EXAM: LEFT LOWER EXTREMITY VENOUS DOPPLER ULTRASOUND TECHNIQUE: Gray-scale sonography with compression, as well as color and duplex ultrasound, were performed to evaluate the deep venous system(s) from the level of the common femoral vein through the popliteal and proximal calf veins. COMPARISON:  None. FINDINGS: VENOUS Thrombus extending from the femoral vein to the visualized calf veins on the left. Left greater saphenous vein is patent. Limited views of the contralateral common femoral vein are unremarkable. OTHER Mild subcutaneous edema in the calf. Limitations: none IMPRESSION: Positive for deep venous thrombosis extending from the femoral vein to the visualized calf  veins on the left. Electronically Signed: By: Julian Hy M.D. On: 11/06/2021 11:17    Procedures Procedures   Medications Ordered in ED Medications  apixaban (ELIQUIS) tablet 10 mg (has no administration in time range)    ED Course  I have reviewed the triage vital signs and the nursing notes.  Pertinent labs & imaging results that were available during my care of the patient were reviewed by me and considered in my medical  decision making (see chart for details).    MDM Rules/Calculators/A&P                          Raymond Paternoster Sr. is a 70 year old male with no significant medical history presents the ED with left leg pain.  Pain started in the left thigh down the left calf.  There is swelling to the left lower leg.  No signs of infection.  He has good pulses in the lower extremities.  Good strength and sensation.  Concern for DVT and ultrasound was obtained that showed somewhat occlusive thrombus from profundofemoral vein down.  Greater saphenous vein is patent and the junction above the profunda is without clot as well.  Talked with Dr. Stanford Breed with vascular surgery who recommends oral anticoagulation.  Can follow-up with him or primary care.  Clot in this area usually not amenable to thrombectomy.  Overall patient appears well.  Educated about blood thinners.  Discharged in good condition.  We will start Eliquis.     Final Clinical Impression(s) / ED Diagnoses Final diagnoses:  Acute deep vein thrombosis (DVT) of proximal vein of left lower extremity ([place])    Rx / DC Orders ED Discharge Orders          Ordered    APIXABAN (ELIQUIS) VTE STARTER PACK (10MG  AND 5MG )  Status:  Discontinued        11/06/21 1116    APIXABAN (ELIQUIS) VTE STARTER PACK (10MG  AND 5MG )        11/06/21 [place], [place], DO 11/06/21 1136

## 2021-11-06 NOTE — ED Notes (Signed)
ED Provider at bedside. 

## 2021-11-06 NOTE — ED Triage Notes (Signed)
Having left leg, calf pain. Previously has had pain in left thigh area. Onset approx 3-4 days

## 2021-11-11 ENCOUNTER — Ambulatory Visit (INDEPENDENT_AMBULATORY_CARE_PROVIDER_SITE_OTHER): Payer: Medicare Other | Admitting: Family

## 2021-11-11 ENCOUNTER — Encounter: Payer: Self-pay | Admitting: Family

## 2021-11-11 VITALS — BP 138/76 | HR 81 | Temp 97.9°F | Ht 71.0 in | Wt 177.6 lb

## 2021-11-11 DIAGNOSIS — I824Y9 Acute embolism and thrombosis of unspecified deep veins of unspecified proximal lower extremity: Secondary | ICD-10-CM | POA: Diagnosis not present

## 2021-11-11 NOTE — Progress Notes (Signed)
Raymond Coble Sr. is a 70 y.o. male with the following history as recorded in EpicCare:  Patient Active Problem List   Diagnosis Date Noted   Prostate enlargement 06/20/2021   Degenerative disc disease, cervical 12/03/2020   Degenerative lumbar disc 09/27/2020   Degenerative arthritis of left knee 09/27/2020    Current Outpatient Medications  Medication Sig Dispense Refill   APIXABAN (ELIQUIS) VTE STARTER PACK (10MG  AND 5MG ) Take as directed on package: start with two-5mg  tablets twice daily for 7 days. On day 8, switch to one-5mg  tablet twice daily. 1 each 0   atorvastatin (LIPITOR) 10 MG tablet Take 1 tablet (10 mg total) by mouth daily. 90 tablet 1   cholecalciferol (VITAMIN D3) 25 MCG (1000 UNIT) tablet Take 1,000 Units by mouth daily.     hydrochlorothiazide (MICROZIDE) 12.5 MG capsule Take 1 capsule (12.5 mg total) by mouth daily. 90 capsule 1   hydrocortisone cream 1 % Apply 1 application topically 2 (two) times daily.     losartan (COZAAR) 50 MG tablet Take 1 tablet (50 mg total) by mouth daily. 90 tablet 1   mupirocin ointment (BACTROBAN) 2 % Apply 1 application topically 2 (two) times daily. 22 g 0   gabapentin (NEURONTIN) 100 MG capsule Take 2 capsules (200 mg total) by mouth at bedtime. (Patient not taking: Reported on 11/11/2021) 180 capsule 0   No current facility-administered medications for this visit.    Allergies: Penicillins  Past Medical History:  Diagnosis Date   GERD (gastroesophageal reflux disease)    Hyperlipidemia    Hypertension     Past Surgical History:  Procedure Laterality Date   EYE SURGERY      Family History  Problem Relation Age of Onset   Alcohol abuse Mother    Arthritis Mother    Diabetes Mother    Mental retardation Mother    Alcohol abuse Father    Hypertension Father    Cancer Sister    Depression Sister    Cancer Sister    Arthritis Maternal Grandmother    Parkinson's disease Maternal Grandfather     Social History    Tobacco Use   Smoking status: Never   Smokeless tobacco: Not on file  Substance Use Topics   Alcohol use: Not on file    Subjective:   Patient was seen at the ER on 12/25 with severe left leg pain/ swelling; was found to have DVT and was started on Eliquis; Admits he has been more sedentary since illness around time of Thanksgiving; but denies being "bed ridden"- just hadn't felt well for a while.  Has been taking Eliquis as prescribed by ER last week- very concerned about cost of medication; No prior history of DVT or PE;      Objective:  Vitals:   11/11/21 1345  BP: 138/76  Pulse: 81  Temp: 97.9 F (36.6 C)  TempSrc: Oral  SpO2: 96%  Weight: 177 lb 9.6 oz (80.6 kg)  Height: 5\' 11"  (1.803 m)    General: Well developed, well nourished, in no acute distress  Skin : Warm and dry.  Head: Normocephalic and atraumatic  Lungs: Respirations unlabored;  Musculoskeletal: No deformities; swelling/ erythema noted over left lower calf Extremities: No edema, cyanosis, clubbing  Vessels: Symmetric bilaterally  Neurologic: Alert and oriented; speech intact; face symmetrical; moves all extremities well; CNII-XII intact without focal deficit   Assessment:  1. Deep vein thrombosis (DVT) of proximal lower extremity, unspecified chronicity, unspecified laterality ([place])  Plan:  Will plan to keep on Eliquis for at least 12 weeks- samples are given to help with cost concerns; he understands to take the medication bid as directed; will refer to hematology/ oncology for further evaluation; assuming hematology work up is negative, will plan to re-check ultrasound in 3 months to ensure resolution of clot and discuss stopping medication.   Time spent 30 minutes reviewing notes from ER with patient and discussing treatment plan  This visit occurred during the SARS-CoV-2 public health emergency.  Safety protocols were in place, including screening questions prior to the visit, additional usage  of staff PPE, and extensive cleaning of exam room while observing appropriate contact time as indicated for disinfecting solutions.    Return in about 3 months (around 02/09/2022) for repeat ultrasound of leg.  Orders Placed This Encounter  Procedures   Ambulatory referral to Hematology / Oncology    Referral Priority:   Routine    Referral Type:   Consultation    Referral Reason:   Specialty Services Required    Requested Specialty:   Oncology    Number of Visits Requested:   1    Requested Prescriptions    No prescriptions requested or ordered in this encounter

## 2021-11-21 ENCOUNTER — Encounter: Payer: Self-pay | Admitting: Hematology & Oncology

## 2021-11-21 ENCOUNTER — Inpatient Hospital Stay: Payer: Medicare Other | Attending: Family

## 2021-11-21 ENCOUNTER — Inpatient Hospital Stay (HOSPITAL_BASED_OUTPATIENT_CLINIC_OR_DEPARTMENT_OTHER): Payer: Medicare Other | Admitting: Hematology & Oncology

## 2021-11-21 ENCOUNTER — Other Ambulatory Visit: Payer: Self-pay

## 2021-11-21 VITALS — BP 131/89 | HR 81 | Temp 98.2°F | Resp 18 | Wt 179.0 lb

## 2021-11-21 DIAGNOSIS — R3915 Urgency of urination: Secondary | ICD-10-CM

## 2021-11-21 DIAGNOSIS — R630 Anorexia: Secondary | ICD-10-CM | POA: Insufficient documentation

## 2021-11-21 DIAGNOSIS — H35039 Hypertensive retinopathy, unspecified eye: Secondary | ICD-10-CM | POA: Insufficient documentation

## 2021-11-21 DIAGNOSIS — I10 Essential (primary) hypertension: Secondary | ICD-10-CM | POA: Insufficient documentation

## 2021-11-21 DIAGNOSIS — Z7901 Long term (current) use of anticoagulants: Secondary | ICD-10-CM | POA: Diagnosis not present

## 2021-11-21 DIAGNOSIS — M545 Low back pain, unspecified: Secondary | ICD-10-CM | POA: Insufficient documentation

## 2021-11-21 DIAGNOSIS — I82402 Acute embolism and thrombosis of unspecified deep veins of left lower extremity: Secondary | ICD-10-CM

## 2021-11-21 DIAGNOSIS — Z79899 Other long term (current) drug therapy: Secondary | ICD-10-CM | POA: Insufficient documentation

## 2021-11-21 DIAGNOSIS — Z86718 Personal history of other venous thrombosis and embolism: Secondary | ICD-10-CM

## 2021-11-21 DIAGNOSIS — R6882 Decreased libido: Secondary | ICD-10-CM | POA: Insufficient documentation

## 2021-11-21 DIAGNOSIS — I82432 Acute embolism and thrombosis of left popliteal vein: Secondary | ICD-10-CM

## 2021-11-21 DIAGNOSIS — R350 Frequency of micturition: Secondary | ICD-10-CM

## 2021-11-21 DIAGNOSIS — I82412 Acute embolism and thrombosis of left femoral vein: Secondary | ICD-10-CM

## 2021-11-21 DIAGNOSIS — G8929 Other chronic pain: Secondary | ICD-10-CM | POA: Insufficient documentation

## 2021-11-21 DIAGNOSIS — E782 Mixed hyperlipidemia: Secondary | ICD-10-CM | POA: Insufficient documentation

## 2021-11-21 LAB — CMP (CANCER CENTER ONLY)
ALT: 13 U/L (ref 0–44)
AST: 15 U/L (ref 15–41)
Albumin: 4 g/dL (ref 3.5–5.0)
Alkaline Phosphatase: 119 U/L (ref 38–126)
Anion gap: 7 (ref 5–15)
BUN: 10 mg/dL (ref 8–23)
CO2: 29 mmol/L (ref 22–32)
Calcium: 9.6 mg/dL (ref 8.9–10.3)
Chloride: 102 mmol/L (ref 98–111)
Creatinine: 0.95 mg/dL (ref 0.61–1.24)
GFR, Estimated: >60 mL/min (ref >60)
Glucose, Bld: 90 mg/dL (ref 70–99)
Potassium: 3.8 mmol/L (ref 3.5–5.1)
Sodium: 138 mmol/L (ref 135–145)
Total Bilirubin: 0.8 mg/dL (ref 0.3–1.2)
Total Protein: 7 g/dL (ref 6.5–8.1)

## 2021-11-21 LAB — D-DIMER, QUANTITATIVE: D-Dimer, Quant: 2.52 ug/mL-FEU — ABNORMAL HIGH (ref 0.00–0.50)

## 2021-11-21 LAB — CBC WITH DIFFERENTIAL (CANCER CENTER ONLY)
Abs Immature Granulocytes: 0.03 10*3/uL (ref 0.00–0.07)
Basophils Absolute: 0 10*3/uL (ref 0.0–0.1)
Basophils Relative: 1 %
Eosinophils Absolute: 0.1 10*3/uL (ref 0.0–0.5)
Eosinophils Relative: 2 %
HCT: 43.9 % (ref 39.0–52.0)
Hemoglobin: 14.6 g/dL (ref 13.0–17.0)
Immature Granulocytes: 0 %
Lymphocytes Relative: 30 %
Lymphs Abs: 2.4 10*3/uL (ref 0.7–4.0)
MCH: 32.7 pg (ref 26.0–34.0)
MCHC: 33.3 g/dL (ref 30.0–36.0)
MCV: 98.2 fL (ref 80.0–100.0)
Monocytes Absolute: 0.6 10*3/uL (ref 0.1–1.0)
Monocytes Relative: 7 %
Neutro Abs: 4.9 10*3/uL (ref 1.7–7.7)
Neutrophils Relative %: 60 %
Platelet Count: 270 10*3/uL (ref 150–400)
RBC: 4.47 MIL/uL (ref 4.22–5.81)
RDW: 12.6 % (ref 11.5–15.5)
WBC Count: 8.1 10*3/uL (ref 4.0–10.5)
nRBC: 0 % (ref 0.0–0.2)

## 2021-11-21 LAB — ANTITHROMBIN III: AntiThromb III Func: 114 % (ref 75–120)

## 2021-11-21 NOTE — Progress Notes (Signed)
Referral MD  Reason for Referral: Thrombus in the left leg -femoral vein down to posterior tibial vein  Chief Complaint  Patient presents with   New Patient (Initial Visit)  : I have a blood clot in my left leg.  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  Unfortunately, he has dealt with tragedy.  His wife, had Waldenstrom's.  She was treated at [facility].  She apparently had an infection and passed away in I think Jul 05, 2023.  I really hate this for the him.  They had been living in [place].  Then moved to [place] as there was family in [place].  Again he is incredibly interesting to talk to.  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  He also has a [employer].  He has been very active.  Lately after his wife's passing, has not been able to do all that much.  He has been playing tennis.  Again has not been doing much of this.  He has been trying to stay active.  He recently began to develop some pain and swelling in the left leg.  He subsequently had a Doppler done.  This was done on 11/06/2021.  This that showed extensive thrombus from the femoral vein down to the popliteal vein.  He was started on Eliquis.  He was not all that diligent with taking Eliquis at first.  He now is taking 5 mg p.o. twice daily.  He says that the leg is doing better.  There is still a lot of swelling in the left leg but he says there is a little bit more mobility to the muscles.  He has had no chest wall pain.  He has had no cough.  He has had no nausea or vomiting.  He has had no change in bowel or bladder habits.  Apparently he does have a prostate issue.  He does have some urinary urgency and frequency.  There may be some nocturia.  I think he might be seen Urology for this.  Has been no problems with obvious COVID.  However, he thought that he may have had some symptoms that could have been from [place].  However, he did not check for  COVID.  He has had no rashes.  He has had no weight loss or weight gain.  He has had no obvious bleeding.  He does not smoke.  He does not really drink alcohol.  His wife's passing has been incredibly difficult for him.  He just has not been all that active.  He says his 2 dogs really have been a life saver for him.  Overall, there is been no history of blood clots in the family.  He has had past surgeries without any difficulties.  Currently, I would say his performance status is ECOG 0.   Past Medical History:  Diagnosis Date   GERD (gastroesophageal reflux disease)    Hyperlipidemia    Hypertension   :   Past Surgical History:  Procedure Laterality Date   EYE SURGERY    :   Current Outpatient Medications:    APIXABAN (ELIQUIS) VTE STARTER PACK (10MG  AND 5MG ), Take as directed on package: start with two-5mg  tablets twice daily for 7 days. On day 8, switch to one-5mg  tablet twice daily., Disp: 1 each, Rfl: 0   atorvastatin (LIPITOR) 10 MG tablet, Take 1 tablet (10 mg total) by mouth daily., Disp: 90  tablet, Rfl: 1   cholecalciferol (VITAMIN D3) 25 MCG (1000 UNIT) tablet, Take 1,000 Units by mouth daily., Disp: , Rfl:    hydrochlorothiazide (MICROZIDE) 12.5 MG capsule, Take 1 capsule (12.5 mg total) by mouth daily., Disp: 90 capsule, Rfl: 1   hydrocortisone cream 1 %, Apply 1 application topically 2 (two) times daily., Disp: , Rfl:    losartan (COZAAR) 50 MG tablet, Take 1 tablet (50 mg total) by mouth daily., Disp: 90 tablet, Rfl: 1:  :   Allergies  Allergen Reactions   Penicillins Hives and Rash    Severe Rash    Penicillin V Potassium Other (See Comments)  :   Family History  Problem Relation Age of Onset   Alcohol abuse Mother    Arthritis Mother    Diabetes Mother    Mental retardation Mother    Alcohol abuse Father    Hypertension Father    Cancer Sister    Depression Sister    Cancer Sister    Arthritis Maternal Grandmother    Parkinson's disease  Maternal Grandfather   :   Social History   Socioeconomic History   Marital status: Married    Spouse name: Not on file   Number of children: Not on file   Years of education: Not on file   Highest education level: Not on file  Occupational History   Not on file  Tobacco Use   Smoking status: Never   Smokeless tobacco: Not on file  Substance and Sexual Activity   Alcohol use: Not on file   Drug use: Not on file   Sexual activity: Yes  Other Topics Concern   Not on file  Social History Narrative   Not on file   Social Determinants of Health   Financial Resource Strain: Not on file  Food Insecurity: Not on file  Transportation Needs: Not on file  Physical Activity: Not on file  Stress: Not on file  Social Connections: Not on file  Intimate Partner Violence: Not on file  :  Review of Systems  Constitutional:  Positive for malaise/fatigue.  HENT: Negative.    Eyes: Negative.   Respiratory: Negative.    Cardiovascular:  Positive for leg swelling.  Gastrointestinal: Negative.   Genitourinary:  Positive for frequency and urgency.  Musculoskeletal: Negative.   Skin: Negative.   Neurological: Negative.   Endo/Heme/Allergies: Negative.   Psychiatric/Behavioral:  Positive for depression.     Exam: @IPVITALS @ Physical Exam Vitals reviewed.  HENT:     Head: Normocephalic and atraumatic.  Eyes:     Pupils: Pupils are equal, round, and reactive to light.  Cardiovascular:     Rate and Rhythm: Normal rate and regular rhythm.     Heart sounds: Normal heart sounds.  Pulmonary:     Effort: Pulmonary effort is normal.     Breath sounds: Normal breath sounds.  Abdominal:     General: Bowel sounds are normal.     Palpations: Abdomen is soft.  Musculoskeletal:        General: No tenderness or deformity. Normal range of motion.     Cervical back: Normal range of motion.     Comments: His lower extremities shows marked edema in the left leg.  He does have no obvious  palpable venous cord.  He does have good pulses in the distal extremities.  There is no erythema of the left leg.  Right leg is unremarkable.  Lymphadenopathy:     Cervical: No cervical adenopathy.  Skin:  General: Skin is warm and dry.     Findings: No erythema or rash.  Neurological:     Mental Status: He is alert and oriented to person, place, and time.  Psychiatric:        Behavior: Behavior normal.        Thought Content: Thought content normal.        Judgment: Judgment normal.    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  He has a thrombus in the left leg.  Somehow, I have to suspect that he may have had COVID which could have been the source for the thrombus.  We are checking a hypercoagulable panel on him just to be safe.  However, given that there is no history in the family, I would think that this would be unremarkable.  Of course, he may have a positive lupus anticoagulant which we would then have to check in 3 months.  I must say that it is very rare that I can talk to patient about Jethro Tull.  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  We talked quite a bit about classical rock.  I will have him on Eliquis.  I suspect he is probably is going to need Eliquis for quite a while.  I think I will probably have him on Eliquis for probably 1 year.  Again this may be dictated by his hypercoagulable panel.  Given the size of the leg, I do not think we can totally discount the possibility of a thrombectomy if he does not have his significant provement of the leg.  I do worry about the possibility of postphlebitic syndrome.  I think he would be at risk.  He may have to think about a  compression stocking for the leg.  For right now, I would like to see him back in about a month or so just so that we can follow-up and see how his leg is doing.  I told him that if his leg got worse or start having more pain for him to let us know.  Again he is incredibly intelligent.  He is incredibly eloquent.  I just hate the fact that he is suffering so much after his wife's passing.

## 2021-11-22 LAB — HOMOCYSTEINE: Homocysteine: 16.2 umol/L (ref 0.0–19.2)

## 2021-11-22 LAB — LUPUS ANTICOAGULANT PANEL
DRVVT: 50.7 s — ABNORMAL HIGH (ref 0.0–47.0)
PTT Lupus Anticoagulant: 34.8 s (ref 0.0–51.9)

## 2021-11-22 LAB — DRVVT CONFIRM: dRVVT Confirm: 1 ratio (ref 0.8–1.2)

## 2021-11-22 LAB — PROTEIN S ACTIVITY: Protein S Activity: 131 % (ref 63–140)

## 2021-11-22 LAB — PROTEIN S, TOTAL: Protein S Ag, Total: 128 % (ref 60–150)

## 2021-11-22 LAB — PROTEIN C ACTIVITY: Protein C Activity: 123 % (ref 73–180)

## 2021-11-22 LAB — DRVVT MIX: dRVVT Mix: 42.7 s — ABNORMAL HIGH (ref 0.0–40.4)

## 2021-11-23 LAB — BETA-2-GLYCOPROTEIN I ABS, IGG/M/A
Beta-2 Glyco I IgG: <9 GPI IgG units (ref 0–20)
Beta-2-Glycoprotein I IgA: <9 GPI IgA units (ref 0–25)
Beta-2-Glycoprotein I IgM: <9 GPI IgM units (ref 0–32)

## 2021-11-23 LAB — CARDIOLIPIN ANTIBODIES, IGG, IGM, IGA
Anticardiolipin IgA: <9 APL U/mL (ref 0–11)
Anticardiolipin IgG: 11 GPL U/mL (ref 0–14)
Anticardiolipin IgM: 20 MPL U/mL — ABNORMAL HIGH (ref 0–12)

## 2021-11-24 LAB — FACTOR 5 LEIDEN

## 2021-11-25 LAB — PROTHROMBIN GENE MUTATION

## 2021-11-25 LAB — PROTEIN C, TOTAL: Protein C, Total: 125 % (ref 60–150)

## 2021-12-12 ENCOUNTER — Inpatient Hospital Stay: Payer: Medicare Other

## 2021-12-12 ENCOUNTER — Encounter: Payer: Self-pay | Admitting: Hematology & Oncology

## 2021-12-12 ENCOUNTER — Other Ambulatory Visit: Payer: Self-pay

## 2021-12-12 ENCOUNTER — Telehealth: Payer: Self-pay | Admitting: *Deleted

## 2021-12-12 ENCOUNTER — Inpatient Hospital Stay (HOSPITAL_BASED_OUTPATIENT_CLINIC_OR_DEPARTMENT_OTHER): Payer: Medicare Other | Admitting: Hematology & Oncology

## 2021-12-12 VITALS — BP 127/81 | HR 76 | Temp 98.4°F | Resp 17 | Wt 178.0 lb

## 2021-12-12 DIAGNOSIS — I82412 Acute embolism and thrombosis of left femoral vein: Secondary | ICD-10-CM | POA: Diagnosis not present

## 2021-12-12 DIAGNOSIS — R3915 Urgency of urination: Secondary | ICD-10-CM | POA: Diagnosis not present

## 2021-12-12 DIAGNOSIS — Z79899 Other long term (current) drug therapy: Secondary | ICD-10-CM | POA: Diagnosis not present

## 2021-12-12 DIAGNOSIS — R350 Frequency of micturition: Secondary | ICD-10-CM | POA: Diagnosis not present

## 2021-12-12 DIAGNOSIS — I82502 Chronic embolism and thrombosis of unspecified deep veins of left lower extremity: Secondary | ICD-10-CM | POA: Insufficient documentation

## 2021-12-12 DIAGNOSIS — Z7901 Long term (current) use of anticoagulants: Secondary | ICD-10-CM | POA: Diagnosis not present

## 2021-12-12 DIAGNOSIS — I82402 Acute embolism and thrombosis of unspecified deep veins of left lower extremity: Secondary | ICD-10-CM

## 2021-12-12 DIAGNOSIS — I82432 Acute embolism and thrombosis of left popliteal vein: Secondary | ICD-10-CM | POA: Diagnosis not present

## 2021-12-12 HISTORY — DX: Chronic embolism and thrombosis of unspecified deep veins of left lower extremity: I82.502

## 2021-12-12 LAB — CBC WITH DIFFERENTIAL (CANCER CENTER ONLY)
Abs Immature Granulocytes: 0.03 10*3/uL (ref 0.00–0.07)
Basophils Absolute: 0.1 10*3/uL (ref 0.0–0.1)
Basophils Relative: 1 %
Eosinophils Absolute: 0.1 10*3/uL (ref 0.0–0.5)
Eosinophils Relative: 2 %
HCT: 46.4 % (ref 39.0–52.0)
Hemoglobin: 15.2 g/dL (ref 13.0–17.0)
Immature Granulocytes: 0 %
Lymphocytes Relative: 29 %
Lymphs Abs: 2.3 10*3/uL (ref 0.7–4.0)
MCH: 32.4 pg (ref 26.0–34.0)
MCHC: 32.8 g/dL (ref 30.0–36.0)
MCV: 98.9 fL (ref 80.0–100.0)
Monocytes Absolute: 0.6 10*3/uL (ref 0.1–1.0)
Monocytes Relative: 8 %
Neutro Abs: 4.8 10*3/uL (ref 1.7–7.7)
Neutrophils Relative %: 60 %
Platelet Count: 210 10*3/uL (ref 150–400)
RBC: 4.69 MIL/uL (ref 4.22–5.81)
RDW: 13.2 % (ref 11.5–15.5)
WBC Count: 8 10*3/uL (ref 4.0–10.5)
nRBC: 0 % (ref 0.0–0.2)

## 2021-12-12 LAB — D-DIMER, QUANTITATIVE: D-Dimer, Quant: 0.57 ug/mL-FEU — ABNORMAL HIGH (ref 0.00–0.50)

## 2021-12-12 LAB — CMP (CANCER CENTER ONLY)
ALT: 15 U/L (ref 0–44)
AST: 21 U/L (ref 15–41)
Albumin: 4.3 g/dL (ref 3.5–5.0)
Alkaline Phosphatase: 95 U/L (ref 38–126)
Anion gap: 6 (ref 5–15)
BUN: 15 mg/dL (ref 8–23)
CO2: 31 mmol/L (ref 22–32)
Calcium: 9.8 mg/dL (ref 8.9–10.3)
Chloride: 101 mmol/L (ref 98–111)
Creatinine: 0.91 mg/dL (ref 0.61–1.24)
GFR, Estimated: >60 mL/min (ref >60)
Glucose, Bld: 97 mg/dL (ref 70–99)
Potassium: 4.2 mmol/L (ref 3.5–5.1)
Sodium: 138 mmol/L (ref 135–145)
Total Bilirubin: 1.1 mg/dL (ref 0.3–1.2)
Total Protein: 7.2 g/dL (ref 6.5–8.1)

## 2021-12-12 NOTE — Progress Notes (Signed)
Hematology and Oncology Follow Up Visit  Raymond Bublitz Sr. [id number] [birth year] 71 y.o. 12/12/2021   Principle Diagnosis:  Thrombus of left femoral vein down to left popliteal vein --idiopathic  Current Therapy:   Eliquis 5 mg p.o. twice daily-started on 11/06/2021     Interim History:  Raymond [name] is back for follow-up.  This is his second office visit.  We first saw him back in early January.  His left leg is doing better.  There is still some swelling down of the ankle.  However, it is not nearly as swollen as when I first saw him.  The real problem that he has is a cost of the Eliquis.  He really has a hard time affording this.  We will have to see how we can try to get him this less expensive or switch him over to some different.  We did do a hypercoagulable work-up on him.  This was unremarkable for any obvious hypercoagulable state.  He would like to start exercising.  I told him that he could exercise.  I recommended a thigh-high compression stocking for the left leg.  I think this would be worthwhile doing.  He has had no problems with bleeding.  He has had no change in bowel or bladder habits.  He still is having a tough time getting over his wife's passing.  I know this was quite difficult for him.  2 of her sisters are coming down this weekend to go through some of her stuff.  He has had no chest wall pain.  He has had no cough or shortness of breath.  He has had no headache.  His appetite is doing okay.  He is not sleeping well at all.  He thinks this might be because of lack of exercise.  I told him to try some over-the-counter melatonin.  Overall, his performance status is ECOG 1.  Medications:  Current Outpatient Medications:    APIXABAN (ELIQUIS) VTE STARTER PACK (10MG  AND 5MG ), Take as directed on package: start with two-5mg  tablets twice daily for 7 days. On day 8, switch to one-5mg  tablet twice daily., Disp: 1 each, Rfl: 0   atorvastatin (LIPITOR) 10 MG  tablet, Take 1 tablet (10 mg total) by mouth daily., Disp: 90 tablet, Rfl: 1   atorvastatin (LIPITOR) 10 MG tablet, Take 10 mg by mouth daily., Disp: , Rfl:    cholecalciferol (VITAMIN D3) 25 MCG (1000 UNIT) tablet, Take 1,000 Units by mouth daily., Disp: , Rfl:    hydrochlorothiazide (MICROZIDE) 12.5 MG capsule, Take 1 capsule (12.5 mg total) by mouth daily., Disp: 90 capsule, Rfl: 1   hydrocortisone cream 1 %, Apply 1 application topically 2 (two) times daily., Disp: , Rfl:    losartan (COZAAR) 50 MG tablet, Take 1 tablet (50 mg total) by mouth daily., Disp: 90 tablet, Rfl: 1   Turmeric 500 MG CAPS, 1 capsule, Disp: , Rfl:   Allergies:  Allergies  Allergen Reactions   Penicillins Hives and Rash    Severe Rash    Penicillin V Potassium Other (See Comments)    Past Medical History, Surgical history, Social history, and Family History were reviewed and updated.  Review of Systems: Review of Systems  HENT:  Negative.    Eyes: Negative.   Respiratory: Negative.    Cardiovascular:  Positive for leg swelling.  Gastrointestinal: Negative.   Endocrine: Negative.   Genitourinary: Negative.    Musculoskeletal: Negative.   Skin: Negative.   Neurological: Negative.  Hematological: Negative.   Psychiatric/Behavioral:  Positive for sleep disturbance.    Physical Exam:  weight is 178 lb (80.7 kg). His oral temperature is 98.4 F (36.9 C). His blood pressure is 127/81 and his pulse is 76. His respiration is 17 and oxygen saturation is 95%.   Wt Readings from Last 3 Encounters:  12/12/21 178 lb (80.7 kg)  11/21/21 179 lb (81.2 kg)  11/11/21 177 lb 9.6 oz (80.6 kg)    Physical Exam Vitals reviewed.  HENT:     Head: Normocephalic and atraumatic.  Eyes:     Pupils: Pupils are equal, round, and reactive to light.  Cardiovascular:     Rate and Rhythm: Normal rate and regular rhythm.     Heart sounds: Normal heart sounds.  Pulmonary:     Effort: Pulmonary effort is normal.      Breath sounds: Normal breath sounds.  Abdominal:     General: Bowel sounds are normal.     Palpations: Abdomen is soft.  Musculoskeletal:        General: No tenderness or deformity. Normal range of motion.     Cervical back: Normal range of motion.     Comments: His extremities shows some mild swelling in the left lower extremity.  There is some slight pitting edema about the ankle.  I cannot palpate a venous cord.  He has good pulses in his distal extremities.  He has good range of motion of his extremities.  Lymphadenopathy:     Cervical: No cervical adenopathy.  Skin:    General: Skin is warm and dry.     Findings: No erythema or rash.  Neurological:     Mental Status: He is alert and oriented to person, place, and time.  Psychiatric:        Behavior: Behavior normal.        Thought Content: Thought content normal.        Judgment: Judgment normal.     Lab Results  Component Value Date   WBC 8.0 12/12/2021   HGB 15.2 12/12/2021   HCT 46.4 12/12/2021   MCV 98.9 12/12/2021   PLT 210 12/12/2021     Chemistry      Component Value Date/Time   NA 138 12/12/2021 0807   K 4.2 12/12/2021 0807   CL 101 12/12/2021 0807   CO2 31 12/12/2021 0807   BUN 15 12/12/2021 0807   CREATININE 0.91 12/12/2021 0807      Component Value Date/Time   CALCIUM 9.8 12/12/2021 0807   ALKPHOS 95 12/12/2021 0807   AST 21 12/12/2021 0807   ALT 15 12/12/2021 0807   BILITOT 1.1 12/12/2021 0807      Impression and Plan: Raymond [name] is a very nice 71 year old white male.  He has a thrombus in the left leg.  This was idiopathic from all of our studies.  Again he is very interesting.  He has a PhD in psychotherapy.  He loves classic rock of the 42s.  Again we have to see how we can try to get him the Eliquis cheaper or switch him over to some different.  He is going need to be on blood thinner for at least 6 months.  I would like to try to get a Doppler of his left leg when we see him back.  I would  like to see him back in early March.  I do think a compression stocking for the left leg will certainly help, particularly when he is exercising.  [name], MD 1/30/20239:15 AM

## 2021-12-12 NOTE — Telephone Encounter (Signed)
Per 12/12/21 los - gave upcoming appointments- confirmed

## 2021-12-23 ENCOUNTER — Encounter: Payer: Self-pay | Admitting: *Deleted

## 2021-12-23 NOTE — Progress Notes (Signed)
Pt dropped off Pt Assistance/[employer] to be signed by Dr. Marin Olp for assistance with Eliquis.  Form signed by Dr Marin Olp and placed at the front desk for pt to pick up on Monday morning.  Pt notified.

## 2022-01-04 ENCOUNTER — Telehealth: Payer: Self-pay | Admitting: Family

## 2022-01-04 NOTE — Telephone Encounter (Signed)
Patient came in and [facility] from [facility].  Patient wanted Raymond [name] to have it for Friday at his appt   Placed in bin up front

## 2022-01-05 NOTE — Telephone Encounter (Signed)
Paperwork received and placed in provider folder for review.

## 2022-01-06 ENCOUNTER — Ambulatory Visit (INDEPENDENT_AMBULATORY_CARE_PROVIDER_SITE_OTHER): Payer: Medicare Other | Admitting: Family

## 2022-01-06 ENCOUNTER — Encounter: Payer: Self-pay | Admitting: Family

## 2022-01-06 VITALS — BP 121/70 | HR 94 | Temp 97.9°F | Resp 12 | Ht 71.0 in | Wt 178.0 lb

## 2022-01-06 DIAGNOSIS — I82502 Chronic embolism and thrombosis of unspecified deep veins of left lower extremity: Secondary | ICD-10-CM | POA: Diagnosis not present

## 2022-01-06 DIAGNOSIS — F4321 Adjustment disorder with depressed mood: Secondary | ICD-10-CM | POA: Diagnosis not present

## 2022-01-06 DIAGNOSIS — E785 Hyperlipidemia, unspecified: Secondary | ICD-10-CM

## 2022-01-06 MED ORDER — ATORVASTATIN CALCIUM 10 MG PO TABS: 10.0000 mg | ORAL_TABLET | Freq: Every day | ORAL | 3 refills | Status: DC

## 2022-01-06 NOTE — Progress Notes (Signed)
Raymond Kabel Sr. is a 71 y.o. male with the following history as recorded in EpicCare:  Patient Active Problem List   Diagnosis Date Noted   Leg DVT (deep venous thromboembolism), chronic, left ([place]) 12/12/2021   Mixed hyperlipidemia 11/21/2021   Low libido 11/21/2021   Low back pain 11/21/2021   Loss of appetite 11/21/2021   Hypertensive retinopathy 11/21/2021   Essential hypertension 11/21/2021   Chronic pain 11/21/2021   Enlarged prostate 06/20/2021   Degenerative disc disease, cervical 12/03/2020   Degenerative lumbar disc 09/27/2020   Degenerative arthritis of left knee 09/27/2020    Current Outpatient Medications  Medication Sig Dispense Refill   apixaban (ELIQUIS) 5 MG TABS tablet Take 5 mg by mouth 2 (two) times daily.     atorvastatin (LIPITOR) 10 MG tablet Take 10 mg by mouth daily.     cholecalciferol (VITAMIN D3) 25 MCG (1000 UNIT) tablet Take 1,000 Units by mouth daily.     hydrochlorothiazide (MICROZIDE) 12.5 MG capsule Take 1 capsule (12.5 mg total) by mouth daily. 90 capsule 1   hydrocortisone cream 1 % Apply 1 application topically 2 (two) times daily.     losartan (COZAAR) 50 MG tablet Take 1 tablet (50 mg total) by mouth daily. 90 tablet 1   Turmeric 500 MG CAPS 1 capsule     atorvastatin (LIPITOR) 10 MG tablet Take 1 tablet (10 mg total) by mouth daily. 90 tablet 3   No current facility-administered medications for this visit.    Allergies: Penicillins and Penicillin v potassium  Past Medical History:  Diagnosis Date   GERD (gastroesophageal reflux disease)    Hyperlipidemia    Hypertension    Leg DVT (deep venous thromboembolism), chronic, left ([place]) 12/12/2021    Past Surgical History:  Procedure Laterality Date   EYE SURGERY      Family History  Problem Relation Age of Onset   Alcohol abuse Mother    Arthritis Mother    Diabetes Mother    Mental retardation Mother    Alcohol abuse Father    Hypertension Father    Cancer Sister     Depression Sister    Cancer Sister    Arthritis Maternal Grandmother    Parkinson's disease Maternal Grandfather     Social History   Tobacco Use   Smoking status: Never   Smokeless tobacco: Not on file  Substance Use Topics   Alcohol use: Not on file    Subjective:  2 month follow up on grief reaction/ DVT; under care of hematology/ oncology now for management of DVT but is asking for refill on samples of Eliquis;  Weight has stabilized since last OV; has not started working with private therapist due to financial concerns but does note he is feeling somewhat better; would be open to grief counseling through Hospice;      Objective:  Vitals:   01/06/22 1048  BP: 121/70  Pulse: 94  Resp: 12  Temp: 97.9 F (36.6 C)  TempSrc: Oral  SpO2: 96%  Weight: 178 lb (80.7 kg)  Height: 5\' 11"  (1.803 m)    General: Well developed, well nourished, in no acute distress  Skin : Warm and dry.  Head: Normocephalic and atraumatic  Lungs: Respirations unlabored; clear to auscultation bilaterally without wheeze, rales, rhonchi  CVS exam: normal rate and regular rhythm.  Neurologic: Alert and oriented; speech intact; face symmetrical; moves all extremities well; CNII-XII intact without focal deficit   Assessment:  1. Leg DVT (deep venous  thromboembolism), chronic, left ([place])   2. Grief reaction   3. Hyperlipidemia, unspecified hyperlipidemia type     Plan:  Samples given of Eliquis 5 mg bid; stressed to patient to stay on medicine as prescribed and keep planned follow up with hematology/ oncology;  Weight has stabilized; information given regarding grief counseling through Hospice; Refill updated;  Time spent 30 minutes  This visit occurred during the SARS-CoV-2 public health emergency.  Safety protocols were in place, including screening questions prior to the visit, additional usage of staff PPE, and extensive cleaning of exam room while observing appropriate contact time as indicated  for disinfecting solutions.       No follow-ups on file.  No orders of the defined types were placed in this encounter.   Requested Prescriptions   Signed Prescriptions Disp Refills   atorvastatin (LIPITOR) 10 MG tablet 90 tablet 3    Sig: Take 1 tablet (10 mg total) by mouth daily.

## 2022-01-06 NOTE — Patient Instructions (Addendum)
[email or web address]  [phone]  Please go to this website- there is information listed for you to get information regarding starting grief therapy;   Ask your pharmacist to run your Eliquis through [facility] just to see if that will help with costs.

## 2022-01-20 ENCOUNTER — Ambulatory Visit (HOSPITAL_BASED_OUTPATIENT_CLINIC_OR_DEPARTMENT_OTHER)
Admission: RE | Admit: 2022-01-20 | Discharge: 2022-01-20 | Disposition: A | Discharge status: Home / Self Care | Payer: Medicare Other | Source: Ambulatory Visit | Attending: Hematology & Oncology | Admitting: Hematology & Oncology

## 2022-01-20 ENCOUNTER — Inpatient Hospital Stay (HOSPITAL_BASED_OUTPATIENT_CLINIC_OR_DEPARTMENT_OTHER): Payer: Medicare Other | Admitting: Hematology & Oncology

## 2022-01-20 ENCOUNTER — Other Ambulatory Visit: Payer: Self-pay

## 2022-01-20 ENCOUNTER — Encounter: Payer: Self-pay | Admitting: Hematology & Oncology

## 2022-01-20 ENCOUNTER — Inpatient Hospital Stay: Payer: Medicare Other | Attending: Family

## 2022-01-20 VITALS — BP 145/90 | HR 72 | Temp 97.7°F | Resp 17 | Wt 183.0 lb

## 2022-01-20 DIAGNOSIS — I82432 Acute embolism and thrombosis of left popliteal vein: Secondary | ICD-10-CM | POA: Diagnosis not present

## 2022-01-20 DIAGNOSIS — Z7901 Long term (current) use of anticoagulants: Secondary | ICD-10-CM | POA: Insufficient documentation

## 2022-01-20 DIAGNOSIS — I82402 Acute embolism and thrombosis of unspecified deep veins of left lower extremity: Secondary | ICD-10-CM | POA: Insufficient documentation

## 2022-01-20 DIAGNOSIS — I82412 Acute embolism and thrombosis of left femoral vein: Secondary | ICD-10-CM | POA: Diagnosis not present

## 2022-01-20 DIAGNOSIS — I82502 Chronic embolism and thrombosis of unspecified deep veins of left lower extremity: Secondary | ICD-10-CM

## 2022-01-20 LAB — CMP (CANCER CENTER ONLY)
ALT: 14 U/L (ref 0–44)
AST: 19 U/L (ref 15–41)
Albumin: 3.9 g/dL (ref 3.5–5.0)
Alkaline Phosphatase: 104 U/L (ref 38–126)
Anion gap: 7 (ref 5–15)
BUN: 17 mg/dL (ref 8–23)
CO2: 31 mmol/L (ref 22–32)
Calcium: 9.3 mg/dL (ref 8.9–10.3)
Chloride: 103 mmol/L (ref 98–111)
Creatinine: 1.05 mg/dL (ref 0.61–1.24)
GFR, Estimated: >60 mL/min (ref >60)
Glucose, Bld: 88 mg/dL (ref 70–99)
Potassium: 5 mmol/L (ref 3.5–5.1)
Sodium: 141 mmol/L (ref 135–145)
Total Bilirubin: 1 mg/dL (ref 0.3–1.2)
Total Protein: 6.9 g/dL (ref 6.5–8.1)

## 2022-01-20 LAB — CBC WITH DIFFERENTIAL (CANCER CENTER ONLY)
Abs Immature Granulocytes: 0.04 10*3/uL (ref 0.00–0.07)
Basophils Absolute: 0.1 10*3/uL (ref 0.0–0.1)
Basophils Relative: 1 %
Eosinophils Absolute: 0.2 10*3/uL (ref 0.0–0.5)
Eosinophils Relative: 2 %
HCT: 47.3 % (ref 39.0–52.0)
Hemoglobin: 15.4 g/dL (ref 13.0–17.0)
Immature Granulocytes: 0 %
Lymphocytes Relative: 37 %
Lymphs Abs: 3.4 10*3/uL (ref 0.7–4.0)
MCH: 32.3 pg (ref 26.0–34.0)
MCHC: 32.6 g/dL (ref 30.0–36.0)
MCV: 99.2 fL (ref 80.0–100.0)
Monocytes Absolute: 0.7 10*3/uL (ref 0.1–1.0)
Monocytes Relative: 8 %
Neutro Abs: 4.9 10*3/uL (ref 1.7–7.7)
Neutrophils Relative %: 52 %
Platelet Count: 234 10*3/uL (ref 150–400)
RBC: 4.77 MIL/uL (ref 4.22–5.81)
RDW: 13.3 % (ref 11.5–15.5)
WBC Count: 9.3 10*3/uL (ref 4.0–10.5)
nRBC: 0 % (ref 0.0–0.2)

## 2022-01-20 LAB — D-DIMER, QUANTITATIVE: D-Dimer, Quant: 0.31 ug/mL-FEU (ref 0.00–0.50)

## 2022-01-20 MED ORDER — WARFARIN SODIUM 5 MG PO TABS: 5.0000 mg | ORAL_TABLET | Freq: Every day | ORAL | 3 refills | Status: DC

## 2022-01-20 NOTE — Progress Notes (Signed)
?Hematology and Oncology Follow Up Visit ? ?Raymond Nez Sr. ?[id number] ?1951/08/15 71 y.o. ?01/20/2022 ? ? ?Principle Diagnosis:  ?Thrombus of left femoral vein down to left popliteal vein --idiopathic ? ?Current Therapy:   ?Coumadin 5 mg po q day -- keep INR 2-3. -- start on 01/20/2022 ?    ?Interim History:  Raymond [name] is back for follow-up.  The big problem that he is having is the cost of the Eliquis.  He still cannot get the Eliquis for any less than $500 a month.  This is causing a lot of of stress for him.  As such, going to have to get him onto Coumadin.  I think it is pathetic that his insurance company will not help cover the Eliquis.  Again, this is all about money.  I am sad that we have to get him on Coumadin because his quality of life is horrible because of all the stress that he is under. ? ?We will start him on 5 mg a day of Coumadin.  I told to keep taking the Eliquis until we get his INR between 2-3. ? ?I know this will help him quite a bit. ? ?We did go ahead and do a Doppler of his left leg.  The Doppler showed the chronic thrombus in his left femoral vein which however this was improved.  There was no definite thrombus noted in the left popliteal or calf veins. ? ?It is apparent that the Eliquis is working very well.  Again, it just is not affordable for Raymond [name]. ? ?He has had no problems with bleeding.  He has had no change in bowel or bladder habits.  He has had no fever.  There is been no chest wall pain.  Is had no cough or shortness of breath. ? ?Overall, he is more active now.  This is helping him get through his wife's death.  He is eating more.  His weight is gone up which is also a good thing for him. ? ?Overall, his performance status is ECOG 1.   ? ? ?Medications:  ?Current Outpatient Medications:  ?  apixaban (ELIQUIS) 5 MG TABS tablet, Take 5 mg by mouth 2 (two) times daily., Disp: , Rfl:  ?  atorvastatin (LIPITOR) 10 MG tablet, Take 10 mg by mouth daily., Disp: ,  Rfl:  ?  atorvastatin (LIPITOR) 10 MG tablet, Take 1 tablet (10 mg total) by mouth daily., Disp: 90 tablet, Rfl: 3 ?  cholecalciferol (VITAMIN D3) 25 MCG (1000 UNIT) tablet, Take 1,000 Units by mouth daily., Disp: , Rfl:  ?  hydrochlorothiazide (MICROZIDE) 12.5 MG capsule, Take 1 capsule (12.5 mg total) by mouth daily., Disp: 90 capsule, Rfl: 1 ?  hydrocortisone cream 1 %, Apply 1 application topically 2 (two) times daily., Disp: , Rfl:  ?  losartan (COZAAR) 50 MG tablet, Take 1 tablet (50 mg total) by mouth daily., Disp: 90 tablet, Rfl: 1 ?  Turmeric 500 MG CAPS, 1 capsule, Disp: , Rfl:  ? ?Allergies:  ?Allergies  ?Allergen Reactions  ? Penicillins Hives and Rash  ?  Severe Rash ?  ? Penicillin V Potassium Other (See Comments)  ? ? ?Past Medical History, Surgical history, Social history, and Family History were reviewed and updated. ? ?Review of Systems: ?Review of Systems  ?HENT:  Negative.    ?Eyes: Negative.   ?Respiratory: Negative.    ?Cardiovascular:  Positive for leg swelling.  ?Gastrointestinal: Negative.   ?Endocrine: Negative.   ?Genitourinary: Negative.    ?  Musculoskeletal: Negative.   ?Skin: Negative.   ?Neurological: Negative.   ?Hematological: Negative.   ?Psychiatric/Behavioral:  Positive for sleep disturbance.   ? ?Physical Exam: ? weight is 183 lb (83 kg). His oral temperature is 97.7 ?F (36.5 ?C). His blood pressure is 145/90 (abnormal) and his pulse is 72. His respiration is 17 and oxygen saturation is 96%.  ? ?Wt Readings from Last 3 Encounters:  ?01/20/22 183 lb (83 kg)  ?01/06/22 178 lb (80.7 kg)  ?12/12/21 178 lb (80.7 kg)  ? ? ?Physical Exam ?Vitals reviewed.  ?HENT:  ?   Head: Normocephalic and atraumatic.  ?Eyes:  ?   Pupils: Pupils are equal, round, and reactive to light.  ?Cardiovascular:  ?   Rate and Rhythm: Normal rate and regular rhythm.  ?   Heart sounds: Normal heart sounds.  ?Pulmonary:  ?   Effort: Pulmonary effort is normal.  ?   Breath sounds: Normal breath sounds.  ?Abdominal:   ?   General: Bowel sounds are normal.  ?   Palpations: Abdomen is soft.  ?Musculoskeletal:     ?   General: No tenderness or deformity. Normal range of motion.  ?   Cervical back: Normal range of motion.  ?   Comments: His extremities shows some mild swelling in the left lower extremity.  There is some slight pitting edema about the ankle.  I cannot palpate a venous cord.  He has good pulses in his distal extremities.  He has good range of motion of his extremities.  ?Lymphadenopathy:  ?   Cervical: No cervical adenopathy.  ?Skin: ?   General: Skin is warm and dry.  ?   Findings: No erythema or rash.  ?Neurological:  ?   Mental Status: He is alert and oriented to person, place, and time.  ?Psychiatric:     ?   Behavior: Behavior normal.     ?   Thought Content: Thought content normal.     ?   Judgment: Judgment normal.  ? ? ? ?Lab Results  ?Component Value Date  ? WBC 9.3 01/20/2022  ? HGB 15.4 01/20/2022  ? HCT 47.3 01/20/2022  ? MCV 99.2 01/20/2022  ? PLT 234 01/20/2022  ? ?  Chemistry   ?   ?Component Value Date/Time  ? NA 141 01/20/2022 1348  ? K 5.0 01/20/2022 1348  ? CL 103 01/20/2022 1348  ? CO2 31 01/20/2022 1348  ? BUN 17 01/20/2022 1348  ? CREATININE 1.05 01/20/2022 1348  ?    ?Component Value Date/Time  ? CALCIUM 9.3 01/20/2022 1348  ? ALKPHOS 104 01/20/2022 1348  ? AST 19 01/20/2022 1348  ? ALT 14 01/20/2022 1348  ? BILITOT 1.0 01/20/2022 1348  ?  ? ? ?Impression and Plan: ?Raymond [name] is a very nice 71 year old white male.  He has a thrombus in the left leg.  This was idiopathic from all of our studies.  Again he is very interesting.  He has a PhD in psychotherapy.  He loves classic rock of the 27s. ? ?Again, we will switch him over to Coumadin.  He understands the issues with Coumadin.  He understands that his good had to be checked frequently to keep his INR between 2-3.  He understands that how to watch what he eats.  I told him that he can have some wine or beer every now and then. ? ?We will have  him come back in 6 days for an INR check.  Again I  want his INR between 2-3. ? ?I will see him back in 6 weeks. ? ?I do not see that we have to do another Doppler up on him for a while. ? ? ?[name], MD ?3/10/20232:53 PM  ?

## 2022-01-25 ENCOUNTER — Other Ambulatory Visit: Payer: Self-pay

## 2022-01-25 ENCOUNTER — Inpatient Hospital Stay: Payer: Medicare Other

## 2022-01-25 DIAGNOSIS — I82412 Acute embolism and thrombosis of left femoral vein: Secondary | ICD-10-CM | POA: Diagnosis not present

## 2022-01-25 DIAGNOSIS — I82502 Chronic embolism and thrombosis of unspecified deep veins of left lower extremity: Secondary | ICD-10-CM

## 2022-01-25 DIAGNOSIS — Z7901 Long term (current) use of anticoagulants: Secondary | ICD-10-CM | POA: Diagnosis not present

## 2022-01-25 DIAGNOSIS — I82432 Acute embolism and thrombosis of left popliteal vein: Secondary | ICD-10-CM | POA: Diagnosis not present

## 2022-01-25 LAB — PROTIME-INR
INR: 1.6 — ABNORMAL HIGH (ref 0.8–1.2)
Prothrombin Time: 18.9 seconds — ABNORMAL HIGH (ref 11.4–15.2)

## 2022-01-26 ENCOUNTER — Telehealth: Payer: Self-pay | Admitting: *Deleted

## 2022-01-26 NOTE — Telephone Encounter (Signed)
-----   Message from [name], MD sent at 01/25/2022  5:26 PM EDT ----- ?Please call and tell him that the Coumadin is still not therapeutic.  We need to have him take 5 mg a day alternating with 10 mg a day.  Please have him come back on Monday to recheck the INR.  Thanks.  Raymond [name] ?

## 2022-01-26 NOTE — Telephone Encounter (Addendum)
Patient notified per order of Dr. Marin Olp "that the Coumadin is still not therapeutic.  We need to have him take 5 mg a day alternating with 10 mg a day and to come back at next scheduled appt on Tuesday, 01/31/22 to recheck the INR.  Pt appreciative of call and would like to know how much longer he will need to take the Eliquis BID.  Call placed back to patient and message left to notify him per order of Dr. Marin Olp that he will need to continue the Eliquis until his INR is greater than 2.  Instructed pt to call office back with any further questions or concerns. ?

## 2022-01-31 ENCOUNTER — Inpatient Hospital Stay: Payer: Medicare Other

## 2022-01-31 ENCOUNTER — Other Ambulatory Visit: Payer: Self-pay

## 2022-01-31 ENCOUNTER — Telehealth: Payer: Self-pay

## 2022-01-31 DIAGNOSIS — I82412 Acute embolism and thrombosis of left femoral vein: Secondary | ICD-10-CM | POA: Diagnosis not present

## 2022-01-31 DIAGNOSIS — I82432 Acute embolism and thrombosis of left popliteal vein: Secondary | ICD-10-CM | POA: Diagnosis not present

## 2022-01-31 DIAGNOSIS — I82502 Chronic embolism and thrombosis of unspecified deep veins of left lower extremity: Secondary | ICD-10-CM

## 2022-01-31 DIAGNOSIS — Z7901 Long term (current) use of anticoagulants: Secondary | ICD-10-CM | POA: Diagnosis not present

## 2022-01-31 LAB — CMP (CANCER CENTER ONLY)
ALT: 17 U/L (ref 0–44)
AST: 24 U/L (ref 15–41)
Albumin: 4.3 g/dL (ref 3.5–5.0)
Alkaline Phosphatase: 109 U/L (ref 38–126)
Anion gap: 7 (ref 5–15)
BUN: 17 mg/dL (ref 8–23)
CO2: 30 mmol/L (ref 22–32)
Calcium: 9.7 mg/dL (ref 8.9–10.3)
Chloride: 103 mmol/L (ref 98–111)
Creatinine: 0.96 mg/dL (ref 0.61–1.24)
GFR, Estimated: >60 mL/min (ref >60)
Glucose, Bld: 115 mg/dL — ABNORMAL HIGH (ref 70–99)
Potassium: 4.4 mmol/L (ref 3.5–5.1)
Sodium: 140 mmol/L (ref 135–145)
Total Bilirubin: 1.1 mg/dL (ref 0.3–1.2)
Total Protein: 7.4 g/dL (ref 6.5–8.1)

## 2022-01-31 LAB — CBC WITH DIFFERENTIAL (CANCER CENTER ONLY)
Abs Immature Granulocytes: 0.03 10*3/uL (ref 0.00–0.07)
Basophils Absolute: 0 10*3/uL (ref 0.0–0.1)
Basophils Relative: 1 %
Eosinophils Absolute: 0.1 10*3/uL (ref 0.0–0.5)
Eosinophils Relative: 1 %
HCT: 49.4 % (ref 39.0–52.0)
Hemoglobin: 16.1 g/dL (ref 13.0–17.0)
Immature Granulocytes: 0 %
Lymphocytes Relative: 31 %
Lymphs Abs: 2.1 10*3/uL (ref 0.7–4.0)
MCH: 32.2 pg (ref 26.0–34.0)
MCHC: 32.6 g/dL (ref 30.0–36.0)
MCV: 98.8 fL (ref 80.0–100.0)
Monocytes Absolute: 0.6 10*3/uL (ref 0.1–1.0)
Monocytes Relative: 8 %
Neutro Abs: 4 10*3/uL (ref 1.7–7.7)
Neutrophils Relative %: 59 %
Platelet Count: 238 10*3/uL (ref 150–400)
RBC: 5 MIL/uL (ref 4.22–5.81)
RDW: 13.3 % (ref 11.5–15.5)
WBC Count: 6.8 10*3/uL (ref 4.0–10.5)
nRBC: 0 % (ref 0.0–0.2)

## 2022-01-31 LAB — PROTIME-INR
INR: 5 (ref 0.8–1.2)
Prothrombin Time: 46.3 seconds — ABNORMAL HIGH (ref 11.4–15.2)

## 2022-01-31 NOTE — Progress Notes (Unsigned)
Critical INR = 5.0. Notified to [facility] at 1129 on 21Mar23 by Hheath. ? ?

## 2022-01-31 NOTE — Telephone Encounter (Signed)
Pt INR today is 5.0. MD aware, pt to stop eliquis, hold coumadin today and tomorrow and restarted Thursday at '5mg'$  a day. Recheck labs on 3/28 as scheduled. Pt called and informed. He repeated new orders and declines any questions or concerns at this time.  ?

## 2022-02-06 ENCOUNTER — Other Ambulatory Visit: Payer: Self-pay | Admitting: *Deleted

## 2022-02-06 DIAGNOSIS — Z86718 Personal history of other venous thrombosis and embolism: Secondary | ICD-10-CM

## 2022-02-06 DIAGNOSIS — I82502 Chronic embolism and thrombosis of unspecified deep veins of left lower extremity: Secondary | ICD-10-CM

## 2022-02-06 DIAGNOSIS — I82402 Acute embolism and thrombosis of unspecified deep veins of left lower extremity: Secondary | ICD-10-CM

## 2022-02-07 ENCOUNTER — Inpatient Hospital Stay: Payer: Medicare Other

## 2022-02-07 ENCOUNTER — Other Ambulatory Visit: Payer: Self-pay

## 2022-02-07 DIAGNOSIS — I82412 Acute embolism and thrombosis of left femoral vein: Secondary | ICD-10-CM | POA: Diagnosis not present

## 2022-02-07 DIAGNOSIS — I82502 Chronic embolism and thrombosis of unspecified deep veins of left lower extremity: Secondary | ICD-10-CM

## 2022-02-07 DIAGNOSIS — I82432 Acute embolism and thrombosis of left popliteal vein: Secondary | ICD-10-CM | POA: Diagnosis not present

## 2022-02-07 DIAGNOSIS — Z86718 Personal history of other venous thrombosis and embolism: Secondary | ICD-10-CM

## 2022-02-07 DIAGNOSIS — I82402 Acute embolism and thrombosis of unspecified deep veins of left lower extremity: Secondary | ICD-10-CM

## 2022-02-07 DIAGNOSIS — Z7901 Long term (current) use of anticoagulants: Secondary | ICD-10-CM | POA: Diagnosis not present

## 2022-02-07 LAB — PROTIME-INR
INR: 2.6 — ABNORMAL HIGH (ref 0.8–1.2)
Prothrombin Time: 27.8 seconds — ABNORMAL HIGH (ref 11.4–15.2)

## 2022-02-08 ENCOUNTER — Telehealth: Payer: Self-pay | Admitting: *Deleted

## 2022-02-08 ENCOUNTER — Telehealth: Payer: Self-pay | Admitting: Hematology & Oncology

## 2022-02-08 NOTE — Telephone Encounter (Signed)
Called to schedule lab only appointment per 3/28 sch msg, left voicemail  ?

## 2022-02-08 NOTE — Telephone Encounter (Signed)
Per scheduling message Raymond [name] - called and lvm of upcoming appointment - requested call back to confirm. ?

## 2022-02-09 ENCOUNTER — Ambulatory Visit: Payer: Medicare Other | Admitting: Family

## 2022-02-09 ENCOUNTER — Ambulatory Visit (INDEPENDENT_AMBULATORY_CARE_PROVIDER_SITE_OTHER): Payer: Medicare Other | Admitting: Family Medicine

## 2022-02-09 VITALS — BP 130/86 | HR 89 | Ht 71.0 in | Wt 178.0 lb

## 2022-02-09 DIAGNOSIS — M5030 Other cervical disc degeneration, unspecified cervical region: Secondary | ICD-10-CM | POA: Diagnosis not present

## 2022-02-09 DIAGNOSIS — M5136 Other intervertebral disc degeneration, lumbar region: Secondary | ICD-10-CM | POA: Diagnosis not present

## 2022-02-09 DIAGNOSIS — N400 Enlarged prostate without lower urinary tract symptoms: Secondary | ICD-10-CM | POA: Diagnosis not present

## 2022-02-09 NOTE — Assessment & Plan Note (Signed)
Degenerative arthritic changes noted at this moment.  Discussed home exercises and icing regimen, which activities to do which ones to avoid.  Patient did not have to do as much of the follow-through with any of his other elements secondary to direct and family including his spouse.  Patient hopefully has time to take care of her son and continuing to start with physical therapy.  Patient is very clear today.  We will see how patient responds and follow-up with me again in 6 to 8 weeks ?

## 2022-02-09 NOTE — Patient Instructions (Addendum)
PT at [facility] ?Continue gabapentin but take at night ?Do prescribed exercises at least 3x a week ?See you again in 7-8 weeks ?

## 2022-02-09 NOTE — Progress Notes (Signed)
?Raymond [name] D.O. ?[facility] ?[place] ?Phone: [phone] ?Subjective:   ? ?I'm seeing this patient by the request  of:  Marrian Salvage, FNP ? ?CC: Neck and back pain follow-up ? ?[id number]  ?06/20/2021 ?Do believe that the right-sided S1 nerve root impingement could be contributing to more the radicular symptoms and tightness noted on the right side.  We will have patient get a nerve root injection depending on how patient responds.  Could also be a candidate for the possibility of radiofrequency ablation.  Hopefully patient will make some improvement.  Patient was having difficulty with gabapentin and can titrate off.  We will see the neurologist for his other problems.  Follow-up with me again in 4 to 6 weeks after the injection to see how patient responds ? ?Patient's MRI did show findings suggestive of enlargement.  Will refer to urology for further care. ? ?Updated 02/09/2022 ?Raymond Stopa Sr. is a 71 y.o. male coming in with complaint of neck pain. Didn't follow up on referrals. Has blood clot. Neck pain for 3 weeks. Heat helps a lot. Tiger balm as well. Little more on the left side. No numbness and tingling radiating. Significant weight loss. Moving a little more since christmas. ? ? ? ?  ? ?Past Medical History:  ?Diagnosis Date  ? GERD (gastroesophageal reflux disease)   ? Hyperlipidemia   ? Hypertension   ? Leg DVT (deep venous thromboembolism), chronic, left ([place]) 12/12/2021  ? ?Past Surgical History:  ?Procedure Laterality Date  ? EYE SURGERY    ? ?Social History  ? ?Socioeconomic History  ? Marital status: Married  ?  Spouse name: Not on file  ? Number of children: Not on file  ? Years of education: Not on file  ? Highest education level: Not on file  ?Occupational History  ? Not on file  ?Tobacco Use  ? Smoking status: Never  ? Smokeless tobacco: Not on file  ?Substance and Sexual Activity  ? Alcohol use: Not on file  ? Drug use: Not on  file  ? Sexual activity: Yes  ?Other Topics Concern  ? Not on file  ?Social History Narrative  ? Not on file  ? ?Social Determinants of Health  ? ?Financial Resource Strain: Not on file  ?Food Insecurity: Not on file  ?Transportation Needs: Not on file  ?Physical Activity: Not on file  ?Stress: Not on file  ?Social Connections: Not on file  ? ?Allergies  ?Allergen Reactions  ? Penicillins Hives and Rash  ?  Severe Rash ?  ? Penicillin V Potassium Other (See Comments)  ? ?Family History  ?Problem Relation Age of Onset  ? Alcohol abuse Mother   ? Arthritis Mother   ? Diabetes Mother   ? Mental retardation Mother   ? Alcohol abuse Father   ? Hypertension Father   ? Cancer Sister   ? Depression Sister   ? Cancer Sister   ? Arthritis Maternal Grandmother   ? Parkinson's disease Maternal Grandfather   ? ? ? ?Current Outpatient Medications (Cardiovascular):  ?  atorvastatin (LIPITOR) 10 MG tablet, Take 10 mg by mouth daily. ?  atorvastatin (LIPITOR) 10 MG tablet, Take 1 tablet (10 mg total) by mouth daily. ?  hydrochlorothiazide (MICROZIDE) 12.5 MG capsule, Take 1 capsule (12.5 mg total) by mouth daily. ?  losartan (COZAAR) 50 MG tablet, Take 1 tablet (50 mg total) by mouth daily. ? ? ? ?Current Outpatient Medications (Hematological):  ?  warfarin (COUMADIN) 5 MG tablet, Take 1 tablet (5 mg total) by mouth daily. ? ?Current Outpatient Medications (Other):  ?  cholecalciferol (VITAMIN D3) 25 MCG (1000 UNIT) tablet, Take 1,000 Units by mouth daily. ?  hydrocortisone cream 1 %, Apply 1 application topically 2 (two) times daily. ?  Turmeric 500 MG CAPS, 1 capsule ? ? ?Reviewed prior external information including notes and imaging from  ?primary care provider ?As well as notes that were available from care everywhere and other healthcare systems.  Reviewed patient's most recent hospitalization for a DVT.  Also reviewed patient's follow-ups with oncology ? ?Past medical history, social, surgical and family history all reviewed  in electronic medical record.  No pertanent information unless stated regarding to the chief complaint.  ? ?Review of Systems: ? No headache, visual changes, nausea, vomiting, diarrhea, constipation, dizziness, abdominal pain, skin rash, fevers, chills, night sweats, weight loss, swollen lymph nodes, body aches, joint swelling, chest pain, shortness of breath, mood changes. POSITIVE muscle aches ? ?Objective  ?Blood pressure 130/86, pulse 89, height '5\' 11"'$  (1.803 m), weight 178 lb (80.7 kg), SpO2 96 %. ?  ?General: No apparent distress alert and oriented x3 mood and affect normal, dressed appropriately.  Minorly anxious ?HEENT: Pupils equal, extraocular movements intact  ?Respiratory: Patient's speak in full sentences and does not appear short of breath  ?Cardiovascular: No lower extremity edema, non tender, no erythema  ?Gait normal with good balance and coordination.  ?MSK: Neck exam does have significant loss of lordosis.  Tenderness to palpation in the paraspinal musculature of the neck.  Limited sidebending.  Negative Spurling's.  5 out of 5 strength of the upper extremities. ?Low back exam does have tightness noted with FABER test right greater than left. ?Some degenerative scoliosis noted.  Some tenderness in the paraspinal musculature right greater than left ?  ?Impression and Recommendations:  ?  ? ?The above documentation has been reviewed and is accurate and complete Lyndal Pulley, DO ? ? ? ?

## 2022-02-09 NOTE — Assessment & Plan Note (Signed)
Same pain.  He does have significant arthritic changes.  MRI of the low back did show a right-sided L S1 impingement.  I do think that patient could be a candidate for potential injections as well as possible radiofrequency ablation.  Patient wants to hold at this time. ?

## 2022-02-09 NOTE — Assessment & Plan Note (Signed)
Patient has been referred to urology previously and we discussed calling patient with the referral should be active. ?

## 2022-02-20 ENCOUNTER — Encounter: Payer: Self-pay | Admitting: *Deleted

## 2022-02-20 ENCOUNTER — Inpatient Hospital Stay: Payer: Medicare Other | Attending: Family

## 2022-02-20 DIAGNOSIS — I82412 Acute embolism and thrombosis of left femoral vein: Secondary | ICD-10-CM | POA: Diagnosis not present

## 2022-02-20 DIAGNOSIS — I82432 Acute embolism and thrombosis of left popliteal vein: Secondary | ICD-10-CM | POA: Diagnosis not present

## 2022-02-20 DIAGNOSIS — Z79899 Other long term (current) drug therapy: Secondary | ICD-10-CM | POA: Diagnosis not present

## 2022-02-20 DIAGNOSIS — Z7901 Long term (current) use of anticoagulants: Secondary | ICD-10-CM | POA: Insufficient documentation

## 2022-02-20 DIAGNOSIS — Z86718 Personal history of other venous thrombosis and embolism: Secondary | ICD-10-CM

## 2022-02-20 LAB — PROTIME-INR
INR: 2.3 — ABNORMAL HIGH (ref 0.8–1.2)
Prothrombin Time: 24.7 seconds — ABNORMAL HIGH (ref 11.4–15.2)

## 2022-03-02 ENCOUNTER — Other Ambulatory Visit: Payer: Self-pay | Admitting: *Deleted

## 2022-03-02 DIAGNOSIS — Z86718 Personal history of other venous thrombosis and embolism: Secondary | ICD-10-CM

## 2022-03-02 DIAGNOSIS — I82502 Chronic embolism and thrombosis of unspecified deep veins of left lower extremity: Secondary | ICD-10-CM

## 2022-03-02 DIAGNOSIS — I82402 Acute embolism and thrombosis of unspecified deep veins of left lower extremity: Secondary | ICD-10-CM

## 2022-03-03 ENCOUNTER — Inpatient Hospital Stay (HOSPITAL_BASED_OUTPATIENT_CLINIC_OR_DEPARTMENT_OTHER): Payer: Medicare Other | Admitting: Hematology & Oncology

## 2022-03-03 ENCOUNTER — Other Ambulatory Visit: Payer: Self-pay

## 2022-03-03 ENCOUNTER — Encounter: Payer: Self-pay | Admitting: Hematology & Oncology

## 2022-03-03 ENCOUNTER — Inpatient Hospital Stay: Payer: Medicare Other

## 2022-03-03 VITALS — BP 118/76 | HR 81 | Temp 98.2°F | Resp 16 | Wt 175.0 lb

## 2022-03-03 DIAGNOSIS — I82432 Acute embolism and thrombosis of left popliteal vein: Secondary | ICD-10-CM | POA: Diagnosis not present

## 2022-03-03 DIAGNOSIS — Z86718 Personal history of other venous thrombosis and embolism: Secondary | ICD-10-CM

## 2022-03-03 DIAGNOSIS — I82502 Chronic embolism and thrombosis of unspecified deep veins of left lower extremity: Secondary | ICD-10-CM

## 2022-03-03 DIAGNOSIS — Z79899 Other long term (current) drug therapy: Secondary | ICD-10-CM | POA: Diagnosis not present

## 2022-03-03 DIAGNOSIS — I82412 Acute embolism and thrombosis of left femoral vein: Secondary | ICD-10-CM | POA: Diagnosis not present

## 2022-03-03 DIAGNOSIS — Z7901 Long term (current) use of anticoagulants: Secondary | ICD-10-CM | POA: Diagnosis not present

## 2022-03-03 DIAGNOSIS — I82402 Acute embolism and thrombosis of unspecified deep veins of left lower extremity: Secondary | ICD-10-CM

## 2022-03-03 LAB — CMP (CANCER CENTER ONLY)
ALT: 17 U/L (ref 0–44)
AST: 25 U/L (ref 15–41)
Albumin: 4.1 g/dL (ref 3.5–5.0)
Alkaline Phosphatase: 102 U/L (ref 38–126)
Anion gap: 7 (ref 5–15)
BUN: 25 mg/dL — ABNORMAL HIGH (ref 8–23)
CO2: 30 mmol/L (ref 22–32)
Calcium: 9.5 mg/dL (ref 8.9–10.3)
Chloride: 105 mmol/L (ref 98–111)
Creatinine: 1.09 mg/dL (ref 0.61–1.24)
GFR, Estimated: >60 mL/min (ref >60)
Glucose, Bld: 91 mg/dL (ref 70–99)
Potassium: 4.6 mmol/L (ref 3.5–5.1)
Sodium: 142 mmol/L (ref 135–145)
Total Bilirubin: 1 mg/dL (ref 0.3–1.2)
Total Protein: 7.1 g/dL (ref 6.5–8.1)

## 2022-03-03 LAB — CBC WITH DIFFERENTIAL (CANCER CENTER ONLY)
Abs Immature Granulocytes: 0.03 10*3/uL (ref 0.00–0.07)
Basophils Absolute: 0 10*3/uL (ref 0.0–0.1)
Basophils Relative: 0 %
Eosinophils Absolute: 0.1 10*3/uL (ref 0.0–0.5)
Eosinophils Relative: 1 %
HCT: 45.3 % (ref 39.0–52.0)
Hemoglobin: 15.2 g/dL (ref 13.0–17.0)
Immature Granulocytes: 0 %
Lymphocytes Relative: 29 %
Lymphs Abs: 2.7 10*3/uL (ref 0.7–4.0)
MCH: 32.8 pg (ref 26.0–34.0)
MCHC: 33.6 g/dL (ref 30.0–36.0)
MCV: 97.8 fL (ref 80.0–100.0)
Monocytes Absolute: 0.8 10*3/uL (ref 0.1–1.0)
Monocytes Relative: 9 %
Neutro Abs: 5.5 10*3/uL (ref 1.7–7.7)
Neutrophils Relative %: 61 %
Platelet Count: 215 10*3/uL (ref 150–400)
RBC: 4.63 MIL/uL (ref 4.22–5.81)
RDW: 13.1 % (ref 11.5–15.5)
WBC Count: 9.1 10*3/uL (ref 4.0–10.5)
nRBC: 0 % (ref 0.0–0.2)

## 2022-03-03 LAB — PROTIME-INR
INR: 2.3 — ABNORMAL HIGH (ref 0.8–1.2)
Prothrombin Time: 24.9 seconds — ABNORMAL HIGH (ref 11.4–15.2)

## 2022-03-03 NOTE — Progress Notes (Signed)
?Hematology and Oncology Follow Up Visit ? ?Raymond Wale Sr. ?[id number] ?1951/10/04 71 y.o. ?03/03/2022 ? ? ?Principle Diagnosis:  ?Thrombus of left femoral vein down to left popliteal vein --idiopathic ? ?Current Therapy:   ?Coumadin 5 mg po q day -- keep INR 2-3. -- start on 01/20/2022 ?    ?Interim History:  Raymond [name] is back for follow-up.  He is doing great.  He is on Coumadin right now.  His INR is 2.3. ? ?He played tennis this morning.  He is doing more exercising.  He is certainly improving with his overall emotional state. ? ?His 1 concern is that he may be moving back to [place], [place].  This may be next year.  This has to do with work.  I concern he understands if he has to move. ? ?He has had no problems with bleeding.  He does have some easy bruising. ? ?There is no cough or shortness of breath.  He has had no problems with nausea or vomiting.  He has had a little bit of swelling in the left leg but he is left footed. ? ?Overall, I would say his performance status right now is ECOG 0.   ? ? ?Medications:  ?Current Outpatient Medications:  ?  atorvastatin (LIPITOR) 10 MG tablet, Take 1 tablet (10 mg total) by mouth daily., Disp: 90 tablet, Rfl: 3 ?  cholecalciferol (VITAMIN D3) 25 MCG (1000 UNIT) tablet, Take 1,000 Units by mouth daily., Disp: , Rfl:  ?  fluoruracil (FLUOROPLEX) 1 % cream, 1 application to affected area, Disp: , Rfl:  ?  gabapentin (NEURONTIN) 100 MG capsule, Take 200 mg by mouth daily., Disp: , Rfl:  ?  hydrochlorothiazide (MICROZIDE) 12.5 MG capsule, Take 1 capsule (12.5 mg total) by mouth daily., Disp: 90 capsule, Rfl: 1 ?  hydrocortisone cream 1 %, Apply 1 application topically 2 (two) times daily., Disp: , Rfl:  ?  losartan (COZAAR) 50 MG tablet, Take 1 tablet (50 mg total) by mouth daily., Disp: 90 tablet, Rfl: 1 ?  naproxen sodium (ALEVE) 220 MG tablet, Take 220 mg by mouth as needed. Takes before playing tennis., Disp: , Rfl:  ?  Turmeric 500 MG CAPS, 1 capsule, Disp: ,  Rfl:  ?  warfarin (COUMADIN) 5 MG tablet, Take 1 tablet (5 mg total) by mouth daily., Disp: 30 tablet, Rfl: 3 ? ?Allergies:  ?Allergies  ?Allergen Reactions  ? Penicillins Hives and Rash  ?  Severe Rash ?  ? Penicillin V Potassium Other (See Comments)  ? ? ?Past Medical History, Surgical history, Social history, and Family History were reviewed and updated. ? ?Review of Systems: ?Review of Systems  ?HENT:  Negative.    ?Eyes: Negative.   ?Respiratory: Negative.    ?Cardiovascular:  Positive for leg swelling.  ?Gastrointestinal: Negative.   ?Endocrine: Negative.   ?Genitourinary: Negative.    ?Musculoskeletal: Negative.   ?Skin: Negative.   ?Neurological: Negative.   ?Hematological: Negative.   ?Psychiatric/Behavioral:  Positive for sleep disturbance.   ? ?Physical Exam: ? weight is 175 lb (79.4 kg). His oral temperature is 98.2 ?F (36.8 ?C). His blood pressure is 118/76 and his pulse is 81. His respiration is 16 and oxygen saturation is 96%.  ? ?Wt Readings from Last 3 Encounters:  ?03/03/22 175 lb (79.4 kg)  ?02/09/22 178 lb (80.7 kg)  ?01/20/22 183 lb (83 kg)  ? ? ?Physical Exam ?Vitals reviewed.  ?HENT:  ?   Head: Normocephalic and atraumatic.  ?Eyes:  ?  Pupils: Pupils are equal, round, and reactive to light.  ?Cardiovascular:  ?   Rate and Rhythm: Normal rate and regular rhythm.  ?   Heart sounds: Normal heart sounds.  ?Pulmonary:  ?   Effort: Pulmonary effort is normal.  ?   Breath sounds: Normal breath sounds.  ?Abdominal:  ?   General: Bowel sounds are normal.  ?   Palpations: Abdomen is soft.  ?Musculoskeletal:     ?   General: No tenderness or deformity. Normal range of motion.  ?   Cervical back: Normal range of motion.  ?   Comments: His extremities shows some mild swelling in the left lower extremity.  There is some slight pitting edema about the ankle.  I cannot palpate a venous cord.  He has good pulses in his distal extremities.  He has good range of motion of his extremities.  ?Lymphadenopathy:  ?    Cervical: No cervical adenopathy.  ?Skin: ?   General: Skin is warm and dry.  ?   Findings: No erythema or rash.  ?Neurological:  ?   Mental Status: He is alert and oriented to person, place, and time.  ?Psychiatric:     ?   Behavior: Behavior normal.     ?   Thought Content: Thought content normal.     ?   Judgment: Judgment normal.  ? ? ? ?Lab Results  ?Component Value Date  ? WBC 9.1 03/03/2022  ? HGB 15.2 03/03/2022  ? HCT 45.3 03/03/2022  ? MCV 97.8 03/03/2022  ? PLT 215 03/03/2022  ? ?  Chemistry   ?   ?Component Value Date/Time  ? NA 140 01/31/2022 1038  ? K 4.4 01/31/2022 1038  ? CL 103 01/31/2022 1038  ? CO2 30 01/31/2022 1038  ? BUN 17 01/31/2022 1038  ? CREATININE 0.96 01/31/2022 1038  ?    ?Component Value Date/Time  ? CALCIUM 9.7 01/31/2022 1038  ? ALKPHOS 109 01/31/2022 1038  ? AST 24 01/31/2022 1038  ? ALT 17 01/31/2022 1038  ? BILITOT 1.1 01/31/2022 1038  ?  ? ? ?Impression and Plan: ?Raymond [name] is a very nice 72 year old white male.  He has a thrombus in the left leg.  This was idiopathic from all of our studies.  Again he is very interesting.  He has a PhD in psychotherapy.  He loves classic rock of the 69s. ? ?I think we can move his INR checks out a little bit longer.  We will try to go every 3 weeks now. ? ?It will be 6 months and June that he has been on anticoagulation.  When we see him back, we will go ahead and check a Doppler of his left leg. ? ?Still think we have to keep him on anticoagulation for a year.  He is doing well.  I does want to make sure that we are aggressive with our anticoagulation. ? ?I will see him back in June and do the Doppler the same day.   ? ?[name], MD ?4/21/20233:15 PM  ?

## 2022-03-06 ENCOUNTER — Encounter: Payer: Self-pay | Admitting: Physical Therapy

## 2022-03-06 ENCOUNTER — Ambulatory Visit: Payer: Medicare Other | Attending: Family Medicine | Admitting: Physical Therapy

## 2022-03-06 DIAGNOSIS — G8929 Other chronic pain: Secondary | ICD-10-CM | POA: Diagnosis not present

## 2022-03-06 DIAGNOSIS — L821 Other seborrheic keratosis: Secondary | ICD-10-CM | POA: Diagnosis not present

## 2022-03-06 DIAGNOSIS — L570 Actinic keratosis: Secondary | ICD-10-CM | POA: Diagnosis not present

## 2022-03-06 DIAGNOSIS — M545 Low back pain, unspecified: Secondary | ICD-10-CM | POA: Diagnosis not present

## 2022-03-06 DIAGNOSIS — L820 Inflamed seborrheic keratosis: Secondary | ICD-10-CM | POA: Diagnosis not present

## 2022-03-06 DIAGNOSIS — D485 Neoplasm of uncertain behavior of skin: Secondary | ICD-10-CM | POA: Diagnosis not present

## 2022-03-06 DIAGNOSIS — M5030 Other cervical disc degeneration, unspecified cervical region: Secondary | ICD-10-CM | POA: Insufficient documentation

## 2022-03-06 DIAGNOSIS — M25652 Stiffness of left hip, not elsewhere classified: Secondary | ICD-10-CM | POA: Diagnosis not present

## 2022-03-06 DIAGNOSIS — M6281 Muscle weakness (generalized): Secondary | ICD-10-CM | POA: Diagnosis not present

## 2022-03-06 DIAGNOSIS — L814 Other melanin hyperpigmentation: Secondary | ICD-10-CM | POA: Diagnosis not present

## 2022-03-06 DIAGNOSIS — D692 Other nonthrombocytopenic purpura: Secondary | ICD-10-CM | POA: Diagnosis not present

## 2022-03-06 DIAGNOSIS — M5136 Other intervertebral disc degeneration, lumbar region: Secondary | ICD-10-CM | POA: Diagnosis not present

## 2022-03-06 DIAGNOSIS — R293 Abnormal posture: Secondary | ICD-10-CM | POA: Diagnosis not present

## 2022-03-06 DIAGNOSIS — M542 Cervicalgia: Secondary | ICD-10-CM | POA: Diagnosis not present

## 2022-03-06 DIAGNOSIS — D1801 Hemangioma of skin and subcutaneous tissue: Secondary | ICD-10-CM | POA: Diagnosis not present

## 2022-03-06 DIAGNOSIS — D3617 Benign neoplasm of peripheral nerves and autonomic nervous system of trunk, unspecified: Secondary | ICD-10-CM | POA: Diagnosis not present

## 2022-03-06 DIAGNOSIS — Z85828 Personal history of other malignant neoplasm of skin: Secondary | ICD-10-CM | POA: Diagnosis not present

## 2022-03-06 NOTE — Therapy (Signed)
[facility] ?[place] ?[address] ?[place], [place], [address] ?Phone: [phone]   Fax:  [phone] ? ?Physical Therapy Evaluation ? ?Patient Details  ?Name: Raymond Pelissier Sr. ?MRN: [id number] ?Date of Birth: [birth year] ?Referring Provider (PT): Z. Tamala Julian ? ? ?Encounter Date: 03/06/2022 ? ? PT End of Session - 03/06/22 0945   ? ? Visit Number 1   ? Date for PT Re-Evaluation 06/05/22   ? Authorization Type Medicare   ? PT Start Time [phone]   ? PT Stop Time 0930   ? PT Time Calculation (min) 47 min   ? Activity Tolerance Patient tolerated treatment well   ? Behavior During Therapy [facility] for tasks assessed/performed   ? ?  ?  ? ?  ? ? ?Past Medical History:  ?Diagnosis Date  ? GERD (gastroesophageal reflux disease)   ? Hyperlipidemia   ? Hypertension   ? Leg DVT (deep venous thromboembolism), chronic, left ([place]) 12/12/2021  ? ? ?Past Surgical History:  ?Procedure Laterality Date  ? EYE SURGERY    ? ? ?There were no vitals filed for this visit. ? ? ? Subjective Assessment - 03/06/22 0838   ? ? Subjective Patient reports that he has a stiff neck for about 20 years, reports difficulty backing up the car.  He reports that he was scheduled for some injections however his wife passed away last year and had a blood clot and has been getting treatment for this but reports everything is resolved.  He reports that the LBP is long standing as well.  He still plays tennis   ? Diagnostic tests 1. Levoconvex scoliosis and spondylosis without high-grade spinal  canal stenosis.  2. Narrowing of the right subarticular zone and mild bilateral  neural foraminal narrowing at L3-4.  3. Narrowing of the bilateral subarticular zones and  mild-to-moderate left neural foraminal narrowing at L4-5.  4. Right subarticular disc extrusion at L5-S1 causing posterior  displacement of the traversing right S1 nerve root. Mild to moderate  bilateral neural foraminal narrowing at this level.   ? Patient Stated  Goals have less pain   ? Currently in Pain? Yes   ? Pain Score 3    ? Pain Location Back   neck  ? Pain Descriptors / Indicators Tightness;Aching;Sharp   ? Pain Type Chronic pain   ? Pain Radiating Towards no numbness tingling   ? Pain Onset More than a month ago   ? Pain Frequency Constant   ? Aggravating Factors  cold weather, turning head, bending, neck up to 9/10, back up to 7/10   ? Pain Relieving Factors warm weather, [place], heat  Pain can be down to a 2-3/10   ? Effect of Pain on Daily Activities difficulty driving, difficulty with yard and housework   ? ?  ?  ? ?  ? ? ? ? ? [facility] PT Assessment - 03/06/22 0001   ? ?  ? Assessment  ? Medical Diagnosis neck and LBP   ? Referring Provider (PT) Z. Tamala Julian   ? Onset Date/Surgical Date 02/03/22   ? Hand Dominance Left   ? Prior Therapy over a year ago   ?  ? Precautions  ? Precautions None   ?  ? Balance Screen  ? Has the patient fallen in the past 6 months Yes   ? How many times? 3   all while playing tennis  ?  ? Home Environment  ? Additional Comments has stairs, does  down stairs one at a time.  does the housework, does the yardwork   ?  ? Prior Function  ? Level of Independence Independent   ? Vocation Retired   ? Leisure plays tennis 3x/week, walk dogs 5 x/week   ?  ? Posture/Postural Control  ? Posture Comments fwd head, rounded shoulders, he really had difficulty correcting posture, could not do scapular retractio   ?  ? ROM / Strength  ? AROM / PROM / Strength AROM;Strength   ?  ? AROM  ? Overall AROM Comments Cervical ROM flexion decreaed 25%, extension, side bending decreased 75%, rotation decreaesd 50% all with some discomfort, Lumbar ROM decreased 50% flexion, decreaed 75% for extension and side bending c/o tightness int he low back   ?  ? Strength  ? Overall Strength Comments Shoulder 4/5, except left ER is 4-/5, LE's 4+/5   ?  ? Flexibility  ? Soft Tissue Assessment /Muscle Length yes   ? Hamstrings very tight and calves, SLT 50 degrees bilaterally   ?  Quadriceps tight   ? ITB tight   ? Piriformis very tight especially the left, when trying to cross the left over the right he gets a cramp in the right adductor   ?  ? Palpation  ? Palpation comment non tender but tight in the neck and lumbar areas   ? ?  ?  ? ?  ? ? ? ? ? ? ? ? ? ? ? ? ? ?Objective measurements completed on examination: See above findings.  ? ? ? ? ? ? ? ? ? ? ? ? ? ? ? ? PT Short Term Goals - 03/06/22 0952   ? ?  ? PT SHORT TERM GOAL #1  ? Title Pt will be I and compliant with initial HEP.   ? Time 3   ? Period Weeks   ? Status New   ? ?  ?  ? ?  ? ? ? ? PT Long Term Goals - 03/06/22 0952   ? ?  ? PT LONG TERM GOAL #1  ? Title Pt will be I and compliant with long term HEP for maintenance.   ? Time 12   ? Period Weeks   ? Status New   ?  ? PT LONG TERM GOAL #2  ? Title Pt will demo pain free lumbar AROM with no pain.   ? Time 12   ? Period Weeks   ? Status New   ?  ? PT LONG TERM GOAL #3  ? Title increase SLR to 60 degrees without pain   ? Time 12   ? Period Weeks   ? Status New   ?  ? PT LONG TERM GOAL #4  ? Title Pt will report 75% improvement in tolerance for yard work and Artist.   ? Time 12   ? Period Weeks   ? Status New   ?  ? PT LONG TERM GOAL #5  ? Title Patient to demonstrate ability to cross L ankle over R knee for improved ease of dressing.   ? Time 12   ? Period Weeks   ? Status New   ? ?  ?  ? ?  ? ? ? ? ? ? ? ? ? Plan - 03/06/22 0947   ? ? Clinical Impression Statement Patient reports long standing stiff neck and back with pain.  He had x-rays of the  neck that showed significant DDD, lumbar spine  showed stenosis and DDD with some nerve impingment, he does not have any radicular symptoms, he reports worse pain over the past year.  He has difficulty driving the car, difficulty with yardwork.  He is an avid Firefighter.  His wife passed away last year and he had a DVT in his leg.  His neck and lumbar motions are very limited, he is very tight in the LE mms, he is left handed  and has very weak left ER of the shoulder.  He is non tender.   ? Stability/Clinical Decision Making Stable/Uncomplicated   ? Clinical Decision Making Low   ? Rehab Potential Good   ? PT Frequency 2x / week   ? PT Duration 12 weeks   ? PT Treatment/Interventions ADLs/Self Care Home Management;Electrical Stimulation;Moist Heat;Traction;Therapeutic activities;Therapeutic exercise;Balance training;Neuromuscular re-education;Manual techniques;Patient/family education;Dry needling   ? PT Next Visit Plan start gym activities, and flexibility, could try traction   ? Consulted and Agree with Plan of Care Patient   ? ?  ?  ? ?  ? ? ?Patient will benefit from skilled therapeutic intervention in order to improve the following deficits and impairments:  Decreased range of motion, Difficulty walking, Increased muscle spasms, Pain, Impaired flexibility, Improper body mechanics, Postural dysfunction, Decreased strength, Decreased mobility ? ?Visit Diagnosis: ?Chronic bilateral low back pain without sciatica - Plan: PT plan of care cert/re-cert ? ?Stiffness of left hip, not elsewhere classified - Plan: PT plan of care cert/re-cert ? ?Abnormal posture - Plan: PT plan of care cert/re-cert ? ?Cervicalgia - Plan: PT plan of care cert/re-cert ? ?Muscle weakness (generalized) - Plan: PT plan of care cert/re-cert ? ? ? ? ?Problem List ?Patient Active Problem List  ? Diagnosis Date Noted  ? Leg DVT (deep venous thromboembolism), chronic, left ([place]) 12/12/2021  ? Mixed hyperlipidemia 11/21/2021  ? Low libido 11/21/2021  ? Low back pain 11/21/2021  ? Loss of appetite 11/21/2021  ? Hypertensive retinopathy 11/21/2021  ? Essential hypertension 11/21/2021  ? Chronic pain 11/21/2021  ? Enlarged prostate 06/20/2021  ? Degenerative disc disease, cervical 12/03/2020  ? Degenerative lumbar disc 09/27/2020  ? Degenerative arthritis of left knee 09/27/2020  ? ? Sumner Boast, PT ?03/06/2022, 9:55 AM ? ?[place] ?[place] [place] ?[address] ?[place], [place], [address] ?Phone: [phone]   Fax:  [phone] ? ?Name: Raymond Chrisley Sr. ?MRN: [id number] ?Date of Birth: 11/22/1950 ? ? ?

## 2022-03-06 NOTE — Patient Instructions (Signed)
Access Code: [email or web address] ?URL: [email or web address] ?Date: 03/06/2022 ?Prepared by: Lum Babe ? ?Exercises ?- Seated Hamstring Stretch  - 2 x daily - 7 x weekly - 1 sets - 5 reps - 30 hold ?- Supine Piriformis Stretch Pulling Heel to Hip  - 2 x daily - 7 x weekly - 1 sets - 5 reps - 30 hold ?- Hooklying Single Knee to Chest  - 2 x daily - 7 x weekly - 1 sets - 10 reps - 5 hold ?- Supine Double Knee to Chest  - 2 x daily - 7 x weekly - 1 sets - 10 reps - 5 hold ?- Supine Lower Trunk Rotation  - 2 x daily - 7 x weekly - 1 sets - 10 reps - 5 hold ?

## 2022-03-14 ENCOUNTER — Ambulatory Visit: Payer: Medicare Other | Admitting: Physical Therapy

## 2022-03-15 ENCOUNTER — Telehealth: Payer: Self-pay | Admitting: Family

## 2022-03-15 NOTE — Telephone Encounter (Signed)
Tried calling patient to schedule Medicare Annual Wellness Visit (AWV) either virtually or phone ? ?I did not leave message  voicemail was a business ? ? ?DUE AWVS  05/14/2019 PER [place] ?please schedule at anytime with health coach ? ? ?

## 2022-03-17 ENCOUNTER — Encounter: Payer: Self-pay | Admitting: Physical Therapy

## 2022-03-17 ENCOUNTER — Ambulatory Visit: Payer: Medicare Other | Attending: Family Medicine | Admitting: Physical Therapy

## 2022-03-17 DIAGNOSIS — M545 Low back pain, unspecified: Secondary | ICD-10-CM | POA: Diagnosis not present

## 2022-03-17 DIAGNOSIS — M25652 Stiffness of left hip, not elsewhere classified: Secondary | ICD-10-CM | POA: Diagnosis not present

## 2022-03-17 DIAGNOSIS — M542 Cervicalgia: Secondary | ICD-10-CM | POA: Diagnosis not present

## 2022-03-17 DIAGNOSIS — G8929 Other chronic pain: Secondary | ICD-10-CM | POA: Insufficient documentation

## 2022-03-17 DIAGNOSIS — R293 Abnormal posture: Secondary | ICD-10-CM | POA: Insufficient documentation

## 2022-03-17 DIAGNOSIS — M6281 Muscle weakness (generalized): Secondary | ICD-10-CM | POA: Diagnosis not present

## 2022-03-17 NOTE — Therapy (Signed)
[place] ?[place] ?[address] ?[place], [place], [address] ?Phone: [phone]   Fax:  [phone] ? ?Physical Therapy Treatment ? ?Patient Details  ?Name: Raymond Collymore Sr. ?MRN: [id number] ?Date of Birth: 1951-03-29 ?Referring Provider (PT): Z. Tamala Julian ? ? ?Encounter Date: 03/17/2022 ? ? PT End of Session - 03/17/22 1007   ? ? Visit Number 2   ? Date for PT Re-Evaluation 06/05/22   ? Authorization Type Medicare   ? PT Start Time 0930   ? PT Stop Time 1015   ? PT Time Calculation (Raymond) 45 Raymond   ? Activity Tolerance Patient tolerated treatment well   ? Behavior During Therapy [facility] for tasks assessed/performed   ? ?  ?  ? ?  ? ? ?Past Medical History:  ?Diagnosis Date  ? GERD (gastroesophageal reflux disease)   ? Hyperlipidemia   ? Hypertension   ? Leg DVT (deep venous thromboembolism), chronic, left ([place]) 12/12/2021  ? ? ?Past Surgical History:  ?Procedure Laterality Date  ? EYE SURGERY    ? ? ?There were no vitals filed for this visit. ? ? Subjective Assessment - 03/17/22 0931   ? ? Subjective Today is a pretty good day. Acute neck pain is gone, but the chronic neck pain remains.   ? Diagnostic tests 1. Levoconvex scoliosis and spondylosis without high-grade spinal  canal stenosis.  2. Narrowing of the right subarticular zone and mild bilateral  neural foraminal narrowing at L3-4.  3. Narrowing of the bilateral subarticular zones and  mild-to-moderate left neural foraminal narrowing at L4-5.  4. Right subarticular disc extrusion at L5-S1 causing posterior  displacement of the traversing right S1 nerve root. Mild to moderate  bilateral neural foraminal narrowing at this level.   ? Currently in Pain? No/denies   ? ?  ?  ? ?  ? ? ? ? ? ? ? ? ? ? ? ? ? ? ? ? ? ? ? ? [place] Adult PT Treatment/Exercise - 03/17/22 0001   ? ?  ? Exercises  ? Exercises Neck;Lumbar   ?  ? Neck Exercises: Machines for Strengthening  ? UBE (Upper Arm Bike) L1.5 x3 Raymond each   ? Other Machines for  Strengthening Rows Lats 25lb 2x10   ? Other Machines for Strengthening Shoulder Ext 10lb 2x10   ?  ? Neck Exercises: Standing  ? Other Standing Exercises Shoulder Flex 2lb 2x10   ? Other Standing Exercises ER red 2x10   ?  ? Modalities  ? Modalities Traction   ?  ? Traction  ? Type of Traction Cervical   ? Max (lbs) 13   ? Time 10   ? ?  ?  ? ?  ? ? ? ? ? ? ? ? ? ? ? ? PT Short Term Goals - 03/06/22 0952   ? ?  ? PT SHORT TERM GOAL #1  ? Title Pt will be I and compliant with initial HEP.   ? Time 3   ? Period Weeks   ? Status New   ? ?  ?  ? ?  ? ? ? ? PT Long Term Goals - 03/06/22 0952   ? ?  ? PT LONG TERM GOAL #1  ? Title Pt will be I and compliant with long term HEP for maintenance.   ? Time 12   ? Period Weeks   ? Status New   ?  ? PT LONG TERM GOAL #2  ?  Title Pt will demo pain free lumbar AROM with no pain.   ? Time 12   ? Period Weeks   ? Status New   ?  ? PT LONG TERM GOAL #3  ? Title increase SLR to 60 degrees without pain   ? Time 12   ? Period Weeks   ? Status New   ?  ? PT LONG TERM GOAL #4  ? Title Pt will report 75% improvement in tolerance for yard work and Artist.   ? Time 12   ? Period Weeks   ? Status New   ?  ? PT LONG TERM GOAL #5  ? Title Patient to demonstrate ability to cross L ankle over R knee for improved ease of dressing.   ? Time 12   ? Period Weeks   ? Status New   ? ?  ?  ? ?  ? ? ? ? ? ? ? ? Plan - 03/17/22 1007   ? ? Clinical Impression Statement Pt enters feeling fine reporting that this was one of his better days Pt tolerated an initial progression to TE well evident by no subjective reports of increase pain. R should is more elevated than L with seated rows. L shoulder weakness present with shoulder external rotation. Postural cue needed with standing shoulder extensions. Added cervical traction without issue.   ? Rehab Potential Good   ? PT Frequency 2x / week   ? PT Duration 12 weeks   ? PT Next Visit Plan assess treatment progress as tolerated   ? ?  ?  ? ?   ? ? ?Patient will benefit from skilled therapeutic intervention in order to improve the following deficits and impairments:  Decreased range of motion, Difficulty walking, Increased muscle spasms, Pain, Impaired flexibility, Improper body mechanics, Postural dysfunction, Decreased strength, Decreased mobility ? ?Visit Diagnosis: ?Chronic bilateral low back pain without sciatica ? ?Stiffness of left hip, not elsewhere classified ? ?Cervicalgia ? ?Muscle weakness (generalized) ? ?Abnormal posture ? ? ? ? ?Problem List ?Patient Active Problem List  ? Diagnosis Date Noted  ? Leg DVT (deep venous thromboembolism), chronic, left ([place]) 12/12/2021  ? Mixed hyperlipidemia 11/21/2021  ? Low libido 11/21/2021  ? Low back pain 11/21/2021  ? Loss of appetite 11/21/2021  ? Hypertensive retinopathy 11/21/2021  ? Essential hypertension 11/21/2021  ? Chronic pain 11/21/2021  ? Enlarged prostate 06/20/2021  ? Degenerative disc disease, cervical 12/03/2020  ? Degenerative lumbar disc 09/27/2020  ? Degenerative arthritis of left knee 09/27/2020  ? ? ?Scot Jun, PTA ?03/17/2022, 10:11 AM ? ?[place] ?[place] ?[address] ?[place], [place], [address] ?Phone: [phone]   Fax:  [phone] ? ?Name: Raymond Terrien Sr. ?MRN: [id number] ?Date of Birth: Feb 13, 1951 ? ? ? ?

## 2022-03-20 ENCOUNTER — Ambulatory Visit: Payer: Medicare Other | Admitting: Physical Therapy

## 2022-03-22 ENCOUNTER — Encounter: Payer: Self-pay | Admitting: Physical Therapy

## 2022-03-22 ENCOUNTER — Ambulatory Visit: Payer: Medicare Other | Admitting: Physical Therapy

## 2022-03-22 DIAGNOSIS — M6281 Muscle weakness (generalized): Secondary | ICD-10-CM | POA: Diagnosis not present

## 2022-03-22 DIAGNOSIS — R293 Abnormal posture: Secondary | ICD-10-CM | POA: Diagnosis not present

## 2022-03-22 DIAGNOSIS — M25652 Stiffness of left hip, not elsewhere classified: Secondary | ICD-10-CM | POA: Diagnosis not present

## 2022-03-22 DIAGNOSIS — M542 Cervicalgia: Secondary | ICD-10-CM | POA: Diagnosis not present

## 2022-03-22 DIAGNOSIS — M545 Low back pain, unspecified: Secondary | ICD-10-CM | POA: Diagnosis not present

## 2022-03-22 DIAGNOSIS — G8929 Other chronic pain: Secondary | ICD-10-CM

## 2022-03-22 NOTE — Therapy (Signed)
[facility] ?[place] ?[address] ?[place], [place], [address] ?Phone: [phone]   Fax:  [phone] ? ?Physical Therapy Treatment ? ?Patient Details  ?Name: Raymond Dumont Sr. ?MRN: [id number] ?Date of Birth: 02/04/1951 ?Referring Provider (PT): Z. Tamala Julian ? ? ?Encounter Date: 03/22/2022 ? ? PT End of Session - 03/22/22 9735   ? ? Visit Number 3   ? Date for PT Re-Evaluation 06/05/22   ? Authorization Type Medicare   ? PT Start Time [phone]   ? PT Stop Time 2426   ? PT Time Calculation (min) 57 min   ? Activity Tolerance Patient tolerated treatment well   ? Behavior During Therapy [facility] for tasks assessed/performed   ? ?  ?  ? ?  ? ? ?Past Medical History:  ?Diagnosis Date  ? GERD (gastroesophageal reflux disease)   ? Hyperlipidemia   ? Hypertension   ? Leg DVT (deep venous thromboembolism), chronic, left ([place]) 12/12/2021  ? ? ?Past Surgical History:  ?Procedure Laterality Date  ? EYE SURGERY    ? ? ?There were no vitals filed for this visit. ? ? Subjective Assessment - 03/22/22 0802   ? ? Subjective Doing okay.  A little sore in the morning, stiff as well, I was sore after the traction, but I think it helped   ? Currently in Pain? No/denies   ? ?  ?  ? ?  ? ? ? ? ? ? ? ? ? ? ? ? ? ? ? ? ? ? ? ? [place] Adult PT Treatment/Exercise - 03/22/22 0001   ? ?  ? Neck Exercises: Machines for Strengthening  ? UBE (Upper Arm Bike) L3 x3 min each   ? Other Machines for Strengthening Rows Lats 25lb 2x10   ? Other Machines for Strengthening Shoulder Ext 10lb 2x10   ?  ? Neck Exercises: Stretches  ? Upper Trapezius Stretch Right;Left;3 reps;10 seconds   ? Levator Stretch Right;Left;3 reps;10 seconds   ? Other Neck Stretches gentle passive cervical rotation with some gentle contract relax   ?  ? Lumbar Exercises: Stretches  ? Passive Hamstring Stretch Right;Left;3 reps;20 seconds   ? Piriformis Stretch Right;Left;4 reps;20 seconds   ? Other Lumbar Stretch Exercise adductor stretch   ?  ?  Lumbar Exercises: Machines for Strengthening  ? Leg Press 40# 2x10   ?  ? Lumbar Exercises: Supine  ? Other Supine Lumbar Exercises feet on ball K2C, trunk rotation, small bridges and isometric abs   ?  ? Traction  ? Type of Traction Cervical   ? Max (lbs) 13   ? Time 10   ? ?  ?  ? ?  ? ? ? ? ? ? ? ? ? ? ? ? PT Short Term Goals - 03/22/22 0840   ? ?  ? PT SHORT TERM GOAL #1  ? Title Pt will be I and compliant with initial HEP.   ? Status Achieved   ? ?  ?  ? ?  ? ? ? ? PT Long Term Goals - 03/22/22 0840   ? ?  ? PT LONG TERM GOAL #1  ? Title Pt will be I and compliant with long term HEP for maintenance.   ? Status On-going   ?  ? PT LONG TERM GOAL #2  ? Title Pt will demo pain free lumbar AROM with no pain.   ? Status On-going   ? ?  ?  ? ?  ? ? ? ? ? ? ? ?  Plan - 03/22/22 0838   ? ? Clinical Impression Statement Patient seemed to have some better flexibility, he had been having cramps with the piriformis stretches, we added adductor stretc before and then put the opposite leg out straight, this seemed to eliminate this.  I added some exercises for core and scapular stability, tolerated well without pain   ? PT Next Visit Plan continue to progress as he tolerates   ? Consulted and Agree with Plan of Care Patient   ? ?  ?  ? ?  ? ? ?Patient will benefit from skilled therapeutic intervention in order to improve the following deficits and impairments:  Decreased range of motion, Difficulty walking, Increased muscle spasms, Pain, Impaired flexibility, Improper body mechanics, Postural dysfunction, Decreased strength, Decreased mobility ? ?Visit Diagnosis: ?Chronic bilateral low back pain without sciatica ? ?Stiffness of left hip, not elsewhere classified ? ?Cervicalgia ? ?Muscle weakness (generalized) ? ?Abnormal posture ? ? ? ? ?Problem List ?Patient Active Problem List  ? Diagnosis Date Noted  ? Leg DVT (deep venous thromboembolism), chronic, left ([place]) 12/12/2021  ? Mixed hyperlipidemia 11/21/2021  ? Low libido  11/21/2021  ? Low back pain 11/21/2021  ? Loss of appetite 11/21/2021  ? Hypertensive retinopathy 11/21/2021  ? Essential hypertension 11/21/2021  ? Chronic pain 11/21/2021  ? Enlarged prostate 06/20/2021  ? Degenerative disc disease, cervical 12/03/2020  ? Degenerative lumbar disc 09/27/2020  ? Degenerative arthritis of left knee 09/27/2020  ? ? Sumner Boast, PT ?03/22/2022, 8:40 AM ? ?[facility] ?[place] ?[address] ?[place], [place], [address] ?Phone: [phone]   Fax:  [phone] ? ?Name: Raymond Tabet Sr. ?MRN: [id number] ?Date of Birth: [birth year] ? ? ? ?

## 2022-03-23 ENCOUNTER — Ambulatory Visit: Payer: Medicare Other | Admitting: Physical Therapy

## 2022-03-27 ENCOUNTER — Encounter: Payer: Self-pay | Admitting: Physical Therapy

## 2022-03-27 ENCOUNTER — Ambulatory Visit: Payer: Medicare Other | Admitting: Physical Therapy

## 2022-03-27 DIAGNOSIS — M6281 Muscle weakness (generalized): Secondary | ICD-10-CM

## 2022-03-27 DIAGNOSIS — M545 Low back pain, unspecified: Secondary | ICD-10-CM | POA: Diagnosis not present

## 2022-03-27 DIAGNOSIS — M542 Cervicalgia: Secondary | ICD-10-CM

## 2022-03-27 DIAGNOSIS — M25652 Stiffness of left hip, not elsewhere classified: Secondary | ICD-10-CM

## 2022-03-27 DIAGNOSIS — R293 Abnormal posture: Secondary | ICD-10-CM | POA: Diagnosis not present

## 2022-03-27 DIAGNOSIS — G8929 Other chronic pain: Secondary | ICD-10-CM | POA: Diagnosis not present

## 2022-03-27 NOTE — Therapy (Signed)
[facility] ?[place] ?[address] ?[place], [place], [address] ?Phone: [phone]   Fax:  [phone] ? ?Physical Therapy Treatment ? ?Patient Details  ?Name: Raymond Veselka Sr. ?MRN: [id number] ?Date of Birth: [birth year] ?Referring Provider (PT): Z. Tamala Julian ? ? ?Encounter Date: 03/27/2022 ? ? PT End of Session - 03/27/22 0920   ? ? Visit Number 4   ? Date for PT Re-Evaluation 06/05/22   ? Authorization Type Medicare   ? PT Start Time [phone]   ? PT Stop Time 0934   ? PT Time Calculation (min) 51 min   ? Activity Tolerance Patient tolerated treatment well   ? Behavior During Therapy [facility] for tasks assessed/performed   ? ?  ?  ? ?  ? ? ?Past Medical History:  ?Diagnosis Date  ? GERD (gastroesophageal reflux disease)   ? Hyperlipidemia   ? Hypertension   ? Leg DVT (deep venous thromboembolism), chronic, left ([place]) 12/12/2021  ? ? ?Past Surgical History:  ?Procedure Laterality Date  ? EYE SURGERY    ? ? ?There were no vitals filed for this visit. ? ? Subjective Assessment - 03/27/22 0845   ? ? Subjective I was doing very well after the last treatment, reports  was in the car a lot and had some increased left low back pain   ? Currently in Pain? Yes   ? Pain Score 4    ? Pain Location Back   ? Pain Orientation Left;Lower   ? Pain Descriptors / Indicators Tightness;Spasm;Sore   ? Aggravating Factors  sitting in the car   ? ?  ?  ? ?  ? ? ? ? ? ? ? ? ? ? ? ? ? ? ? ? ? ? ? ? [place] Adult PT Treatment/Exercise - 03/27/22 0001   ? ?  ? Neck Exercises: Machines for Strengthening  ? Other Machines for Strengthening Rows Lats 25lb 2x10   ? Other Machines for Strengthening Shoulder Ext 10lb 2x10   ?  ? Neck Exercises: Stretches  ? Corner Stretch 3 reps;10 seconds   ?  ? Lumbar Exercises: Stretches  ? Passive Hamstring Stretch Right;Left;3 reps;20 seconds   ? Piriformis Stretch Right;Left;4 reps;20 seconds   ? Gastroc Stretch 3 reps;20 seconds   ? Other Lumbar Stretch Exercise adductor  stretch   ?  ? Lumbar Exercises: Aerobic  ? Nustep level 5 x 6 minutes   ?  ? Lumbar Exercises: Machines for Strengthening  ? Leg Press 40# 2x10   ?  ? Lumbar Exercises: Supine  ? Other Supine Lumbar Exercises feet on ball K2C, trunk rotation, small bridges and isometric abs   ?  ? Traction  ? Type of Traction Cervical   ? Max (lbs) 13   ? Time 10   ? ?  ?  ? ?  ? ? ? ? ? ? ? ? ? ? ? ? PT Short Term Goals - 03/22/22 0840   ? ?  ? PT SHORT TERM GOAL #1  ? Title Pt will be I and compliant with initial HEP.   ? Status Achieved   ? ?  ?  ? ?  ? ? ? ? PT Long Term Goals - 03/27/22 0922   ? ?  ? PT LONG TERM GOAL #1  ? Title Pt will be I and compliant with long term HEP for maintenance.   ? Status Partially Met   ?  ? PT LONG TERM GOAL #  2  ? Title Pt will demo pain free lumbar AROM with no pain.   ? Status Partially Met   ? ?  ?  ? ?  ? ? ? ? ? ? ? ? Plan - 03/27/22 0921   ? ? Clinical Impression Statement Patient reported feeling really good after the last session, reports that his legs felt better, he reports being in the car alot over the weekend and this increased his LBP and tightness, reported after our session feeling better   ? PT Next Visit Plan continue to progress as he tolerates   ? Consulted and Agree with Plan of Care Patient   ? ?  ?  ? ?  ? ? ?Patient will benefit from skilled therapeutic intervention in order to improve the following deficits and impairments:  Decreased range of motion, Difficulty walking, Increased muscle spasms, Pain, Impaired flexibility, Improper body mechanics, Postural dysfunction, Decreased strength, Decreased mobility ? ?Visit Diagnosis: ?Chronic bilateral low back pain without sciatica ? ?Stiffness of left hip, not elsewhere classified ? ?Cervicalgia ? ?Muscle weakness (generalized) ? ?Abnormal posture ? ? ? ? ?Problem List ?Patient Active Problem List  ? Diagnosis Date Noted  ? Leg DVT (deep venous thromboembolism), chronic, left ([place]) 12/12/2021  ? Mixed hyperlipidemia  11/21/2021  ? Low libido 11/21/2021  ? Low back pain 11/21/2021  ? Loss of appetite 11/21/2021  ? Hypertensive retinopathy 11/21/2021  ? Essential hypertension 11/21/2021  ? Chronic pain 11/21/2021  ? Enlarged prostate 06/20/2021  ? Degenerative disc disease, cervical 12/03/2020  ? Degenerative lumbar disc 09/27/2020  ? Degenerative arthritis of left knee 09/27/2020  ? ? Sumner Boast, PT ?03/27/2022, 9:24 AM ? ?[place] ?[place] [address] ?[place], [place], [address] ?Phone: [phone]   Fax:  [phone] ? ?Name: Raymond Castellana Sr. ?MRN: [id number] ?Date of Birth: 07-19-1951 ? ? ? ?

## 2022-03-29 NOTE — Progress Notes (Signed)
Raymond [name] [address] [place] Phone: [phone] Subjective:   [name], am serving as a scribe for Dr. Hulan Saas. This visit occurred during the SARS-CoV-2 public health emergency.  Safety protocols were in place, including screening questions prior to the visit, additional usage of staff PPE, and extensive cleaning of exam room while observing appropriate contact time as indicated for disinfecting solutions.   I'm seeing this patient by the request  of:  Marrian Salvage, FNP  CC: Neck and back pain follow-up  [id number]  02/09/2022 Patient has been referred to urology previously and we discussed calling patient with the referral should be active.  Same pain.  He does have significant arthritic changes.  MRI of the low back did show a right-sided L S1 impingement.  I do think that patient could be a candidate for potential injections as well as possible radiofrequency ablation.  Patient wants to hold at this time.  Update 03/30/2022 Raymond Like Sr. is a 71 y.o. male coming in with complaint of lumbar spine pain. Patient states neck pain improved even before starting PT. Has been doing PT and feels like it is helping. Driving last Friday and had some stiffness and pain but that has cleared up. Wants to talk to you about epidural. Trying to increase his weight. Has known degenerative disc disease of the lumbar spine.  Patient is having some mild intermittent pain going down the leg but nothing severe.     Past Medical History:  Diagnosis Date   GERD (gastroesophageal reflux disease)    Hyperlipidemia    Hypertension    Leg DVT (deep venous thromboembolism), chronic, left ([place]) 12/12/2021   Past Surgical History:  Procedure Laterality Date   EYE SURGERY     Social History   Socioeconomic History   Marital status: Married    Spouse name: Not on file   Number of children: Not on file   Years of  education: Not on file   Highest education level: Not on file  Occupational History   Not on file  Tobacco Use   Smoking status: Never   Smokeless tobacco: Not on file  Substance and Sexual Activity   Alcohol use: Not on file   Drug use: Not on file   Sexual activity: Yes  Other Topics Concern   Not on file  Social History Narrative   Not on file   Social Determinants of Health   Financial Resource Strain: Not on file  Food Insecurity: Not on file  Transportation Needs: Not on file  Physical Activity: Not on file  Stress: Not on file  Social Connections: Not on file   Allergies  Allergen Reactions   Penicillins Hives and Rash    Severe Rash    Penicillin V Potassium Other (See Comments)   Family History  Problem Relation Age of Onset   Alcohol abuse Mother    Arthritis Mother    Diabetes Mother    Mental retardation Mother    Alcohol abuse Father    Hypertension Father    Cancer Sister    Depression Sister    Cancer Sister    Arthritis Maternal Grandmother    Parkinson's disease Maternal Grandfather      Current Outpatient Medications (Cardiovascular):    atorvastatin (LIPITOR) 10 MG tablet, Take 1 tablet (10 mg total) by mouth daily.   hydrochlorothiazide (MICROZIDE) 12.5 MG capsule, Take 1 capsule (12.5 mg total) by mouth daily.  losartan (COZAAR) 50 MG tablet, Take 1 tablet (50 mg total) by mouth daily.   Current Outpatient Medications (Analgesics):    naproxen sodium (ALEVE) 220 MG tablet, Take 220 mg by mouth as needed. Takes before playing tennis.  Current Outpatient Medications (Hematological):    warfarin (COUMADIN) 5 MG tablet, Take 1 tablet (5 mg total) by mouth daily.  Current Outpatient Medications (Other):    cholecalciferol (VITAMIN D3) 25 MCG (1000 UNIT) tablet, Take 1,000 Units by mouth daily.   fluoruracil (FLUOROPLEX) 1 % cream, 1 application to affected area   gabapentin (NEURONTIN) 100 MG capsule, Take 200 mg by mouth daily.    hydrocortisone cream 1 %, Apply 1 application topically 2 (two) times daily.   Turmeric 500 MG CAPS, 1 capsule   Reviewed prior external information including notes and imaging from  primary care provider As well as notes that were available from care everywhere and other healthcare systems.  Past medical history, social, surgical and family history all reviewed in electronic medical record.  No pertanent information unless stated regarding to the chief complaint.   Review of Systems:  No headache, visual changes, nausea, vomiting, diarrhea, constipation, dizziness, abdominal pain, skin rash, fevers, chills, night sweats, weight loss, swollen lymph nodes, joint swelling, chest pain, shortness of breath, mood changes. POSITIVE muscle aches, body aches  Objective  Blood pressure 118/74, pulse 68, height '5\' 11"'$  (1.803 m), weight 176 lb (79.8 kg), SpO2 96 %.   General: No apparent distress alert and oriented x3 mood and affect normal, dressed appropriately.  HEENT: Pupils equal, extraocular movements intact  Respiratory: Patient's speak in full sentences and does not appear short of breath  Cardiovascular: No lower extremity edema, non tender, no erythema  Gait normal with good balance and coordination.  MSK: Arthritic changes of multiple joints neck exam does have some limited loss of ROM  Patient's low back does have some mild loss of lordosis.  Some mild tenderness to palpation but nothing severe.  Negative straight leg test noted today though.    Impression and Recommendations:     The above documentation has been reviewed and is accurate and complete Raymond Pulley, DO

## 2022-03-30 ENCOUNTER — Encounter: Payer: Self-pay | Admitting: Family Medicine

## 2022-03-30 ENCOUNTER — Encounter: Payer: Self-pay | Admitting: Physical Therapy

## 2022-03-30 ENCOUNTER — Ambulatory Visit: Payer: Medicare Other | Admitting: Physical Therapy

## 2022-03-30 ENCOUNTER — Ambulatory Visit (INDEPENDENT_AMBULATORY_CARE_PROVIDER_SITE_OTHER): Payer: Medicare Other | Admitting: Family Medicine

## 2022-03-30 DIAGNOSIS — M5441 Lumbago with sciatica, right side: Secondary | ICD-10-CM

## 2022-03-30 DIAGNOSIS — G8929 Other chronic pain: Secondary | ICD-10-CM | POA: Diagnosis not present

## 2022-03-30 DIAGNOSIS — M545 Low back pain, unspecified: Secondary | ICD-10-CM | POA: Diagnosis not present

## 2022-03-30 DIAGNOSIS — M542 Cervicalgia: Secondary | ICD-10-CM

## 2022-03-30 DIAGNOSIS — R293 Abnormal posture: Secondary | ICD-10-CM | POA: Diagnosis not present

## 2022-03-30 DIAGNOSIS — M5030 Other cervical disc degeneration, unspecified cervical region: Secondary | ICD-10-CM

## 2022-03-30 DIAGNOSIS — M6281 Muscle weakness (generalized): Secondary | ICD-10-CM | POA: Diagnosis not present

## 2022-03-30 DIAGNOSIS — M25652 Stiffness of left hip, not elsewhere classified: Secondary | ICD-10-CM

## 2022-03-30 NOTE — Assessment & Plan Note (Addendum)
Arthritic changes noted of the neck as well.  Responding to physical therapy and traction.  Continue gabapentin regularly follow-up again in 3 months total time reviewing physical therapy notes, primary care notes, recent labs 33 minutes with office visit today.

## 2022-03-30 NOTE — Assessment & Plan Note (Signed)
Patient has low back pain.  Discussed icing regimen and home exercises.  Very difficult to do anything else now secondary to patient having a blood thinner.  Encouraged him to avoid anti-inflammatories.  Did discuss with patient to try to take the gabapentin on a more regular basis.  Patient will increase activity slowly otherwise.  We will follow-up with me again

## 2022-03-30 NOTE — Patient Instructions (Signed)
Good to see you! Try to take the gabapentin regularly  See it that helps overall wellbeing Keep working with Legrand Como then transition to personal trainer See you again in 2-3 months

## 2022-03-30 NOTE — Therapy (Signed)
[place]. [place], [place], [address] Phone: [phone]   Fax:  [phone]  Physical Therapy Treatment  Patient Details  Name: Raymond Marsalis Sr. MRN: [id number] Date of Birth: 08-17-1951 Referring Provider (PT): Creig Hines   Encounter Date: 03/30/2022   PT End of Session - 03/30/22 0838     Visit Number 5    Date for PT Re-Evaluation 06/05/22    PT Start Time 0800    PT Stop Time 0845    PT Time Calculation (min) 45 min    Activity Tolerance Patient tolerated treatment well    Behavior During Therapy [facility] for tasks assessed/performed             Past Medical History:  Diagnosis Date   GERD (gastroesophageal reflux disease)    Hyperlipidemia    Hypertension    Leg DVT (deep venous thromboembolism), chronic, left ([place]) 12/12/2021    Past Surgical History:  Procedure Laterality Date   EYE SURGERY      There were no vitals filed for this visit.   Subjective Assessment - 03/30/22 0759     Subjective Pretty well, getting a lot of exercise doing his yard. LBP is back seems to feel fetter after therapy.    Currently in Pain? No/denies                               [facility] Adult PT Treatment/Exercise - 03/30/22 0001       Neck Exercises: Machines for Strengthening   Other Machines for Strengthening Rows Lats 25lb 2x12    Other Machines for Strengthening Shoulder Ext 10lb 2x10      Lumbar Exercises: Stretches   Passive Hamstring Stretch Right;Left;4 reps;20 seconds;10 seconds    Double Knee to Chest Stretch 3 reps;10 seconds;20 seconds    Lower Trunk Rotation 3 reps;10 seconds;20 seconds      Lumbar Exercises: Aerobic   Nustep level 5 x 6 minutes      Lumbar Exercises: Machines for Strengthening   Leg Press 50# 2x10      Lumbar Exercises: Supine   Other Supine Lumbar Exercises feet on ball K2C, trunk rotation, small bridges and isometric abs      Traction   Type of  Traction Cervical    Max (lbs) 13    Time 10                       PT Short Term Goals - 03/30/22 3976       PT SHORT TERM GOAL #1   Title Pt will be I and compliant with initial HEP.    Status Achieved               PT Long Term Goals - 03/30/22 7341       PT LONG TERM GOAL #1   Title Pt will be I and compliant with long term HEP for maintenance.      PT LONG TERM GOAL #2   Title Pt will demo pain free lumbar AROM with no pain.    Status Partially Met                   Plan - 03/30/22 0839     Clinical Impression Statement Pt enters feeling well. His recent flare up of LBP remains but has improved. Continued with a progression of postural strengthening and stretching.  Cues to prevent trunk sway with seated rows. Core weakness present with standing shoulder extensions and with supine LE on pball interventions. Bilateral LE tightness present with passive stretching.    Stability/Clinical Decision Making Stable/Uncomplicated    Rehab Potential Good    PT Frequency 2x / week    PT Duration 12 weeks    PT Treatment/Interventions ADLs/Self Care Home Management;Electrical Stimulation;Moist Heat;Traction;Therapeutic activities;Therapeutic exercise;Balance training;Neuromuscular re-education;Manual techniques;Patient/family education;Dry needling    PT Next Visit Plan continue to progress as he tolerates             Patient will benefit from skilled therapeutic intervention in order to improve the following deficits and impairments:  Decreased range of motion, Difficulty walking, Increased muscle spasms, Pain, Impaired flexibility, Improper body mechanics, Postural dysfunction, Decreased strength, Decreased mobility  Visit Diagnosis: Chronic bilateral low back pain without sciatica  Stiffness of left hip, not elsewhere classified  Cervicalgia  Muscle weakness (generalized)     Problem List Patient Active Problem List   Diagnosis Date Noted    Leg DVT (deep venous thromboembolism), chronic, left ([place]) 12/12/2021   Mixed hyperlipidemia 11/21/2021   Low libido 11/21/2021   Low back pain 11/21/2021   Loss of appetite 11/21/2021   Hypertensive retinopathy 11/21/2021   Essential hypertension 11/21/2021   Chronic pain 11/21/2021   Enlarged prostate 06/20/2021   Degenerative disc disease, cervical 12/03/2020   Degenerative lumbar disc 09/27/2020   Degenerative arthritis of left knee 09/27/2020    Scot Jun, PTA 03/30/2022, 8:42 AM  [place]. [place], [place], [address] Phone: [phone]   Fax:  [phone]  Name: Cordera Stineman Sr. MRN: [id number] Date of Birth: [birth year]

## 2022-04-05 ENCOUNTER — Encounter: Payer: Self-pay | Admitting: Physical Therapy

## 2022-04-05 ENCOUNTER — Ambulatory Visit: Payer: Medicare Other | Admitting: Physical Therapy

## 2022-04-05 DIAGNOSIS — M542 Cervicalgia: Secondary | ICD-10-CM

## 2022-04-05 DIAGNOSIS — M25652 Stiffness of left hip, not elsewhere classified: Secondary | ICD-10-CM

## 2022-04-05 DIAGNOSIS — G8929 Other chronic pain: Secondary | ICD-10-CM

## 2022-04-05 DIAGNOSIS — M6281 Muscle weakness (generalized): Secondary | ICD-10-CM

## 2022-04-05 DIAGNOSIS — R293 Abnormal posture: Secondary | ICD-10-CM | POA: Diagnosis not present

## 2022-04-05 DIAGNOSIS — M545 Low back pain, unspecified: Secondary | ICD-10-CM | POA: Diagnosis not present

## 2022-04-05 NOTE — Therapy (Signed)
[place]. [place], [place], [address] Phone: [phone]   Fax:  [phone]  Physical Therapy Treatment  Patient Details  Name: Raymond Hutzler Sr. MRN: [id number] Date of Birth: 05-Dec-1950 Referring Provider (PT): Creig Hines   Encounter Date: 04/05/2022   PT End of Session - 04/05/22 1420     Visit Number 6    Date for PT Re-Evaluation 06/05/22    Authorization Type Medicare    PT Start Time 1345    PT Stop Time 1430    PT Time Calculation (min) 45 min    Activity Tolerance Patient tolerated treatment well    Behavior During Therapy [facility] for tasks assessed/performed             Past Medical History:  Diagnosis Date   GERD (gastroesophageal reflux disease)    Hyperlipidemia    Hypertension    Leg DVT (deep venous thromboembolism), chronic, left ([place]) 12/12/2021    Past Surgical History:  Procedure Laterality Date   EYE SURGERY      There were no vitals filed for this visit.   Subjective Assessment - 04/05/22 1348     Subjective Just got off the tennis court, back is a little stiff, back was not stiff before hand.    Currently in Pain? No/denies                               [facility] Adult PT Treatment/Exercise - 04/05/22 0001       Neck Exercises: Machines for Strengthening   Other Machines for Strengthening Rows Lats 25lb 2x15    Other Machines for Strengthening Shoulder Ext 10lb 2x10      Lumbar Exercises: Stretches   Passive Hamstring Stretch Right;Left;4 reps;20 seconds;10 seconds    Lower Trunk Rotation 3 reps;10 seconds;20 seconds    Piriformis Stretch Right;Left;4 reps;20 seconds      Lumbar Exercises: Aerobic   Nustep level 5 x 6 minutes      Lumbar Exercises: Machines for Strengthening   Leg Press 60lb 2x10      Lumbar Exercises: Supine   Other Supine Lumbar Exercises feet on ball K2C, trunk rotation, small bridges and isometric abs      Traction    Type of Traction Cervical    Max (lbs) 13    Time 10                       PT Short Term Goals - 03/30/22 8016       PT SHORT TERM GOAL #1   Title Pt will be I and compliant with initial HEP.    Status Achieved               PT Long Term Goals - 04/05/22 1423       PT LONG TERM GOAL #1   Title Pt will be I and compliant with long term HEP for maintenance.    Status Achieved      PT LONG TERM GOAL #2   Title Pt will demo pain free lumbar AROM with no pain.    Status Partially Met      PT LONG TERM GOAL #3   Title increase SLR to 60 degrees without pain    Status On-going      PT LONG TERM GOAL #4   Title Pt will report 75% improvement in tolerance for yard  work and Artist.    Status Partially Met      PT LONG TERM GOAL #5   Title Patient to demonstrate ability to cross L ankle over R knee for improved ease of dressing.    Status On-going                   Plan - 04/05/22 1421     Clinical Impression Statement Pt enters clinic reporting some low back stiffness form playing tennis earlier today. Limited lumbar ROM noted with supine obliques. Cue needed for eccentric control doing shoulder extensions. Increase resistance tolerated with le press. Pt able to complete additional reps with seated rows and lats. Some tightens present in both hamstrings. Pt continues to have a positive response to traction.    Stability/Clinical Decision Making Stable/Uncomplicated    Rehab Potential Good    PT Frequency 2x / week    PT Duration 12 weeks    PT Treatment/Interventions ADLs/Self Care Home Management;Electrical Stimulation;Moist Heat;Traction;Therapeutic activities;Therapeutic exercise;Balance training;Neuromuscular re-education;Manual techniques;Patient/family education;Dry needling    PT Next Visit Plan continue to progress as he tolerates             Patient will benefit from skilled therapeutic intervention in order to improve the  following deficits and impairments:  Decreased range of motion, Difficulty walking, Increased muscle spasms, Pain, Impaired flexibility, Improper body mechanics, Postural dysfunction, Decreased strength, Decreased mobility  Visit Diagnosis: Chronic bilateral low back pain without sciatica  Cervicalgia  Muscle weakness (generalized)  Stiffness of left hip, not elsewhere classified     Problem List Patient Active Problem List   Diagnosis Date Noted   Leg DVT (deep venous thromboembolism), chronic, left ([place]) 12/12/2021   Mixed hyperlipidemia 11/21/2021   Low libido 11/21/2021   Low back pain 11/21/2021   Loss of appetite 11/21/2021   Hypertensive retinopathy 11/21/2021   Essential hypertension 11/21/2021   Chronic pain 11/21/2021   Enlarged prostate 06/20/2021   Degenerative disc disease, cervical 12/03/2020   Degenerative lumbar disc 09/27/2020   Degenerative arthritis of left knee 09/27/2020    Scot Jun, PTA 04/05/2022, 2:24 PM  [place]. [place], [place], [address] Phone: [phone]   Fax:  [phone]  Name: Raymond Belger Sr. MRN: [id number] Date of Birth: 1951-08-26

## 2022-04-12 ENCOUNTER — Other Ambulatory Visit: Payer: Self-pay | Admitting: *Deleted

## 2022-04-12 MED ORDER — WARFARIN SODIUM 5 MG PO TABS: 5.0000 mg | ORAL_TABLET | Freq: Every day | ORAL | 3 refills | Status: DC

## 2022-04-13 ENCOUNTER — Other Ambulatory Visit: Payer: Self-pay | Admitting: *Deleted

## 2022-04-13 ENCOUNTER — Other Ambulatory Visit: Payer: Self-pay | Admitting: Hematology & Oncology

## 2022-04-13 ENCOUNTER — Telehealth: Payer: Self-pay | Admitting: *Deleted

## 2022-04-13 DIAGNOSIS — M7989 Other specified soft tissue disorders: Secondary | ICD-10-CM

## 2022-04-13 DIAGNOSIS — I82502 Chronic embolism and thrombosis of unspecified deep veins of left lower extremity: Secondary | ICD-10-CM

## 2022-04-13 NOTE — Telephone Encounter (Signed)
Received a fax that patient called the answering service this morning at 05:00 am complaining that his right foot is cool and blue compared to the left. The left leg is positive for a DVT. I called patient and he stated,"I played tennis yesterday, and within the first 10 minutes of warming up, my right leg looked funny and felt like it was asleep. I didn't stop playing tennis, I went through the whole match limping. I didn't stop because I was playing a doctor. I went home and showered and the right leg looked OK. Then later on, I had more discomfort but it wasn't painful. Today, the foot is still discolored, not blue but shades of purple. The answering service nurse told me to go to the ER. I didn't go because today is the worse day for me to go because my sister is in town. I only get to see her once a year. She is a Marine scientist. I have been up since 04:30 am. I took my dogs Trazadone and four Alleve to help the pain. I have been on Coumadin for 80 days. I feel something but its not numbness or tingling, it is a different sensation." I instructed him that I would give this message to Dr. Marin Olp and call him back. He verbalized understanding.

## 2022-04-14 ENCOUNTER — Inpatient Hospital Stay: Payer: Medicare Other | Attending: Family

## 2022-04-14 ENCOUNTER — Other Ambulatory Visit: Payer: Self-pay | Admitting: *Deleted

## 2022-04-14 ENCOUNTER — Ambulatory Visit: Payer: Medicare Other | Admitting: Physical Therapy

## 2022-04-14 ENCOUNTER — Encounter: Payer: Self-pay | Admitting: Physical Therapy

## 2022-04-14 ENCOUNTER — Telehealth: Payer: Self-pay | Admitting: *Deleted

## 2022-04-14 ENCOUNTER — Ambulatory Visit (HOSPITAL_BASED_OUTPATIENT_CLINIC_OR_DEPARTMENT_OTHER)
Admission: RE | Admit: 2022-04-14 | Discharge: 2022-04-14 | Disposition: A | Discharge status: Home / Self Care | Payer: Medicare Other | Source: Ambulatory Visit | Attending: Hematology & Oncology | Admitting: Hematology & Oncology

## 2022-04-14 DIAGNOSIS — Z79899 Other long term (current) drug therapy: Secondary | ICD-10-CM | POA: Diagnosis not present

## 2022-04-14 DIAGNOSIS — I82502 Chronic embolism and thrombosis of unspecified deep veins of left lower extremity: Secondary | ICD-10-CM

## 2022-04-14 DIAGNOSIS — R293 Abnormal posture: Secondary | ICD-10-CM | POA: Insufficient documentation

## 2022-04-14 DIAGNOSIS — I82412 Acute embolism and thrombosis of left femoral vein: Secondary | ICD-10-CM | POA: Insufficient documentation

## 2022-04-14 DIAGNOSIS — M25652 Stiffness of left hip, not elsewhere classified: Secondary | ICD-10-CM | POA: Insufficient documentation

## 2022-04-14 DIAGNOSIS — Z7902 Long term (current) use of antithrombotics/antiplatelets: Secondary | ICD-10-CM | POA: Insufficient documentation

## 2022-04-14 DIAGNOSIS — I82432 Acute embolism and thrombosis of left popliteal vein: Secondary | ICD-10-CM | POA: Insufficient documentation

## 2022-04-14 DIAGNOSIS — M6281 Muscle weakness (generalized): Secondary | ICD-10-CM | POA: Insufficient documentation

## 2022-04-14 DIAGNOSIS — M79604 Pain in right leg: Secondary | ICD-10-CM | POA: Diagnosis not present

## 2022-04-14 DIAGNOSIS — M7989 Other specified soft tissue disorders: Secondary | ICD-10-CM | POA: Insufficient documentation

## 2022-04-14 DIAGNOSIS — M545 Low back pain, unspecified: Secondary | ICD-10-CM | POA: Insufficient documentation

## 2022-04-14 DIAGNOSIS — M542 Cervicalgia: Secondary | ICD-10-CM | POA: Insufficient documentation

## 2022-04-14 DIAGNOSIS — G8929 Other chronic pain: Secondary | ICD-10-CM | POA: Insufficient documentation

## 2022-04-14 LAB — PROTIME-INR
INR: 1.9 — ABNORMAL HIGH (ref 0.8–1.2)
Prothrombin Time: 22 seconds — ABNORMAL HIGH (ref 11.4–15.2)

## 2022-04-14 IMAGING — US US EXTREM LOW VENOUS*R*
1 series · 14 of 24 positions shown · non-contrast
Comparison: None Available.

CLINICAL DATA: Right leg pain and swelling for 2 days, history of
left-sided DVT

EXAM:
RIGHT LOWER EXTREMITY VENOUS DOPPLER ULTRASOUND
TECHNIQUE: Gray-scale sonography with compression, as well as color and duplex
ultrasound, were performed to evaluate the deep venous system(s)
from the level of the common femoral vein through the popliteal and
proximal calf veins.

[Series 1: us extrem low venous*right* · 14 of 42 slices shown]
[im 1/42]
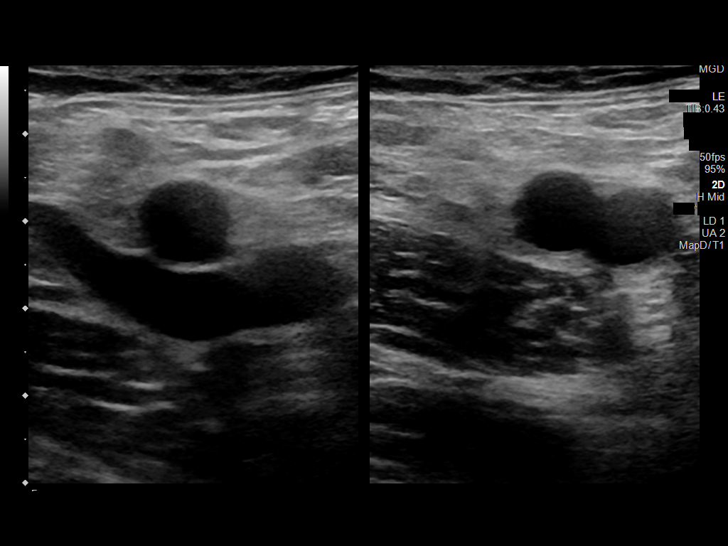
[im 4/42]
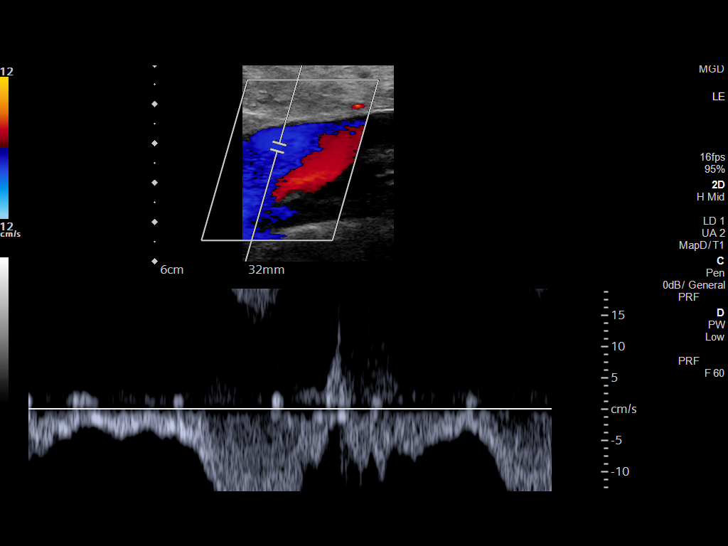
[im 8/42]
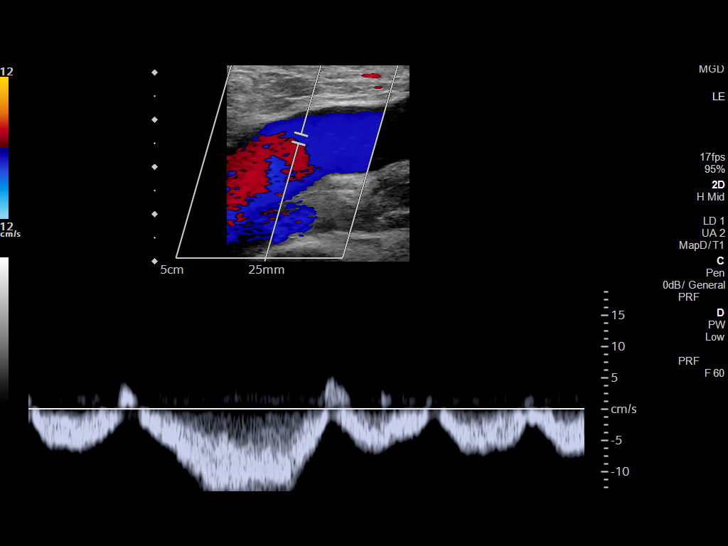
[im 11/42]
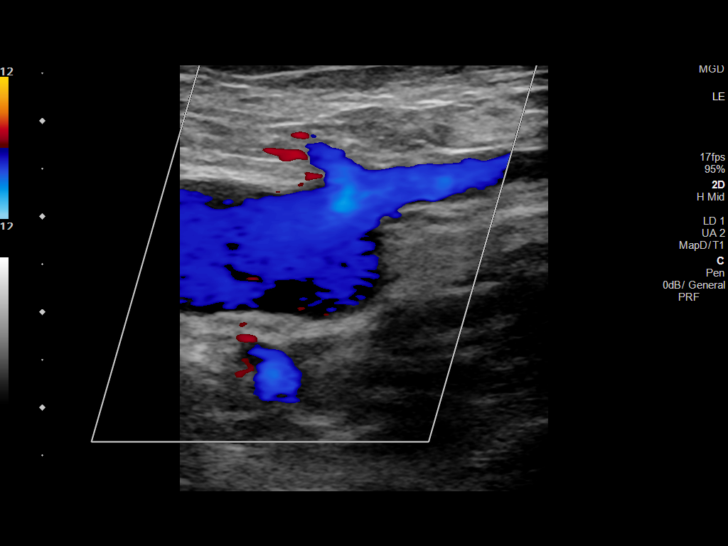
[im 13/42]
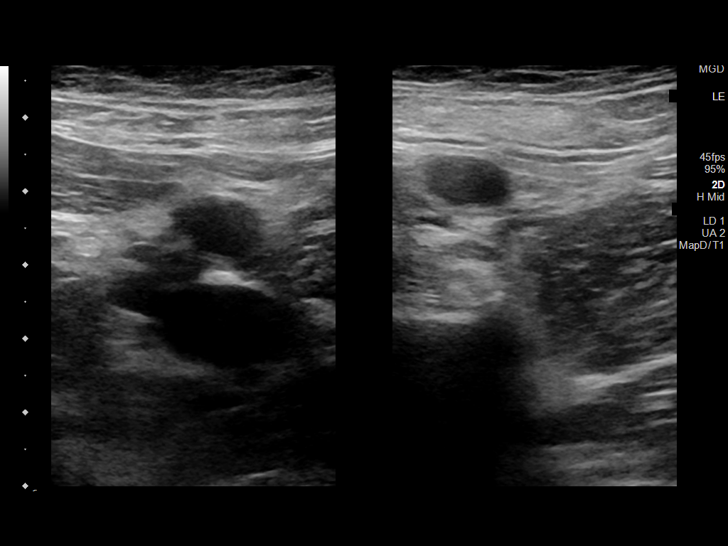
[im 17/42]
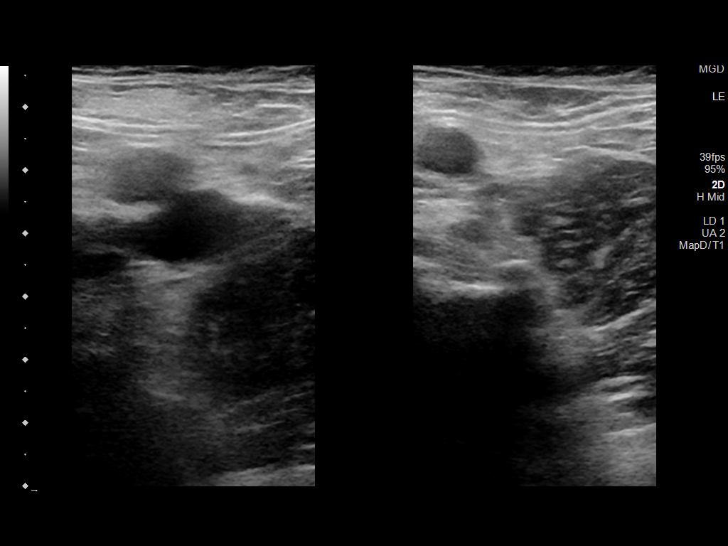
[im 20/42]
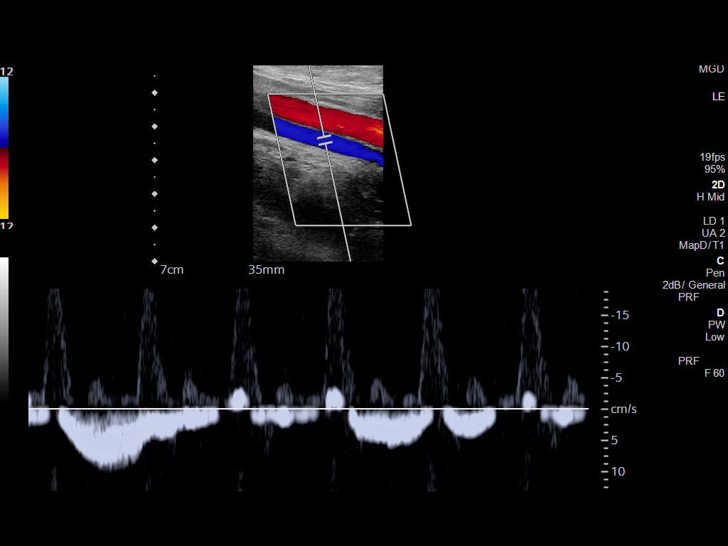
[im 22/42]
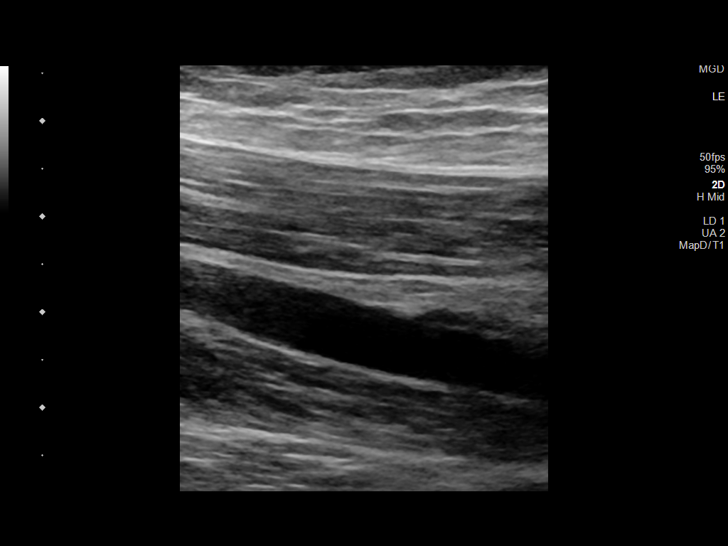
[im 25/42]
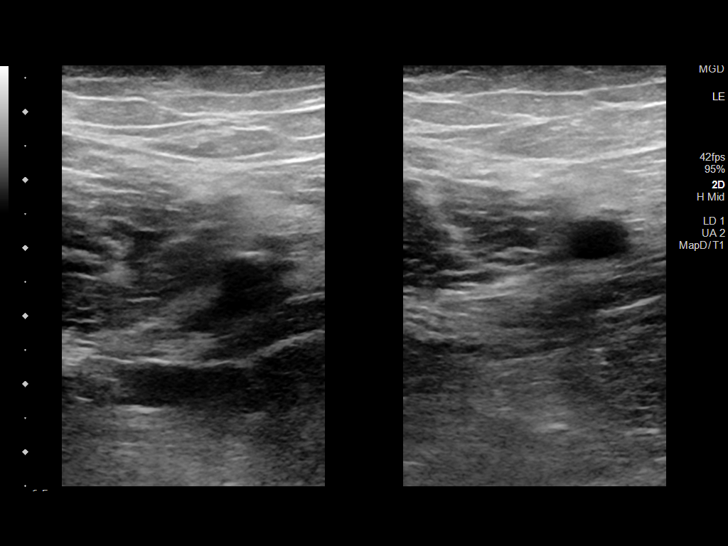
[im 29/42]
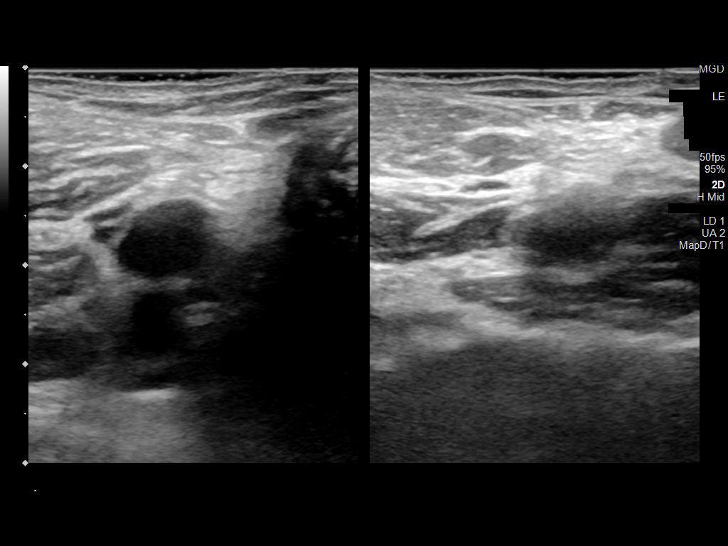
[im 33/42]
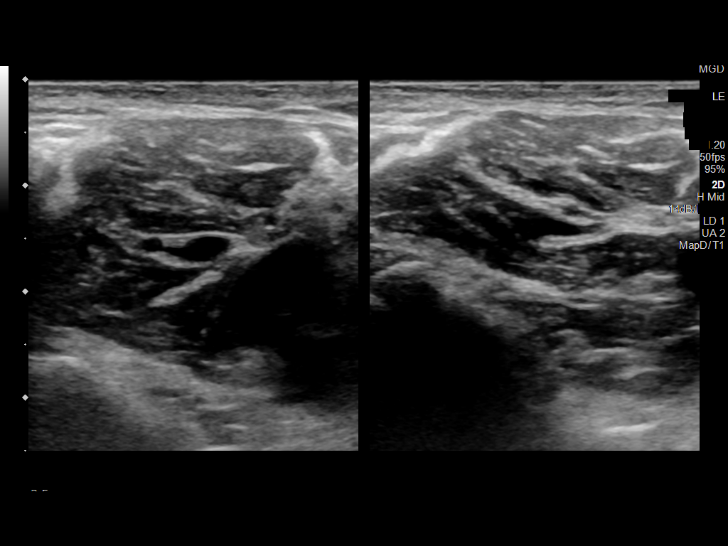
[im 34/42]
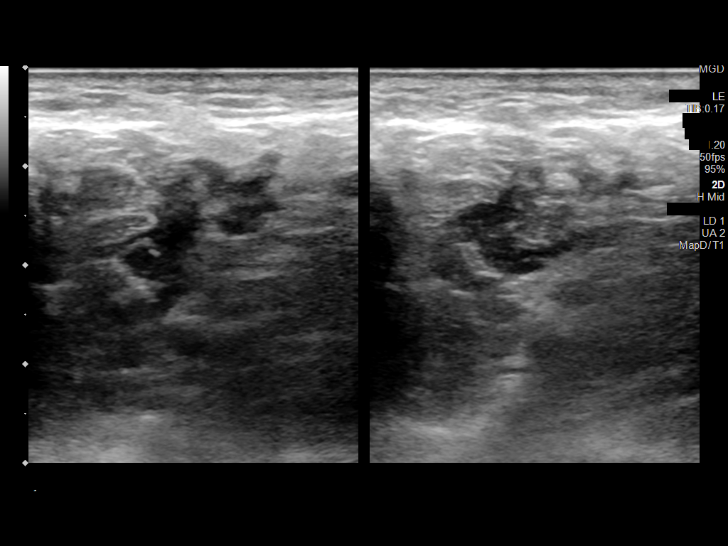
[im 38/42]
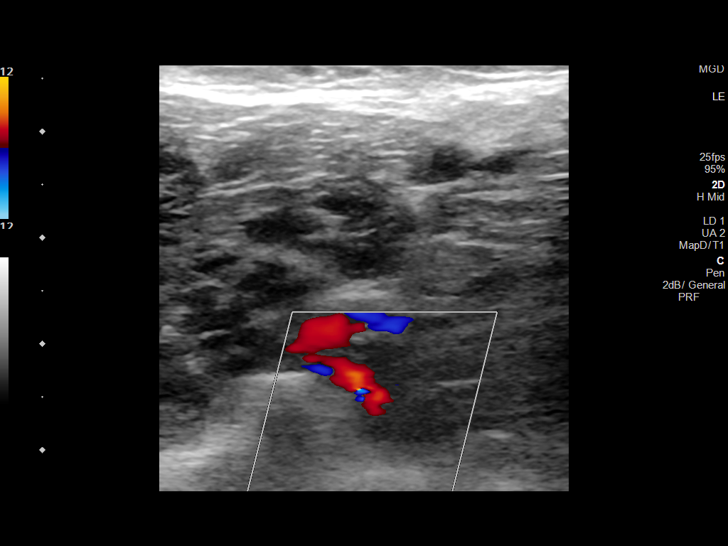
[im 42/42]
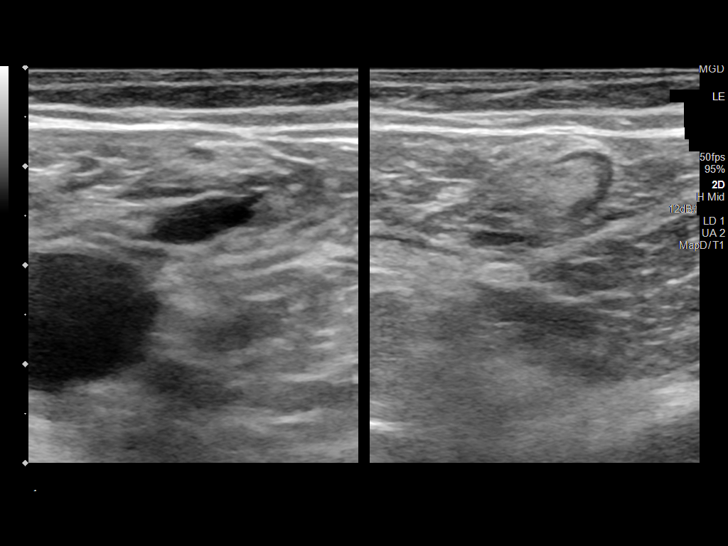

[14 of 24 positions shown; findings below may reference images not displayed]

FINDINGS: VENOUS

Normal compressibility of the common femoral, superficial femoral,
and popliteal veins, as well as the visualized calf veins.
Visualized portions of profunda femoral vein and great saphenous
vein unremarkable. No filling defects to suggest DVT on grayscale or
color Doppler imaging. Doppler waveforms show normal direction of
venous flow, normal respiratory plasticity and response to
augmentation.

Limited views of the contralateral common femoral vein are
unremarkable.

OTHER

None.

Limitations: none
IMPRESSION: Negative examination for deep venous thrombosis in the right lower
extremity.

## 2022-04-14 MED ORDER — WARFARIN SODIUM 5 MG PO TABS: 5.0000 mg | ORAL_TABLET | Freq: Every day | ORAL | 3 refills | Status: DC

## 2022-04-14 NOTE — Telephone Encounter (Signed)
As noted below by Dr. Marin Olp, I informed the patient that there is NO blood clot in the right leg. The INR is 1.9. He would like for you to take an extra dose of Coumadin '5mg'$  for the next three days (Fri/Sat/Sun). I will call in a new prescription for Coumadin to your pharmacy. He wants it called into [facility] at [employer]. He verbalized understanding. Patient stated,"tell Dr. Marin Olp that my sister and I diagnosed my problem. It's called Blue Toe Syndrome. I have every symptom it describes. It talks about capillaries and plaque breaking off floating through my body. My pain is spontaneous and intense." I told him I would give this message to Dr. Marin Olp. He verbalized understanding.

## 2022-04-14 NOTE — Therapy (Signed)
[facility]. [place], [place], [address] Phone: [phone]   Fax:  [phone]  Physical Therapy Treatment  Patient Details  Name: Raymond Goettl Sr. MRN: [id number] Date of Birth: Feb 28, 1951 Referring Provider (PT): Creig Hines   Encounter Date: 04/14/2022   PT End of Session - 04/14/22 0928     Visit Number 6    Date for PT Re-Evaluation 06/05/22    Authorization Type Medicare             Past Medical History:  Diagnosis Date   GERD (gastroesophageal reflux disease)    Hyperlipidemia    Hypertension    Leg DVT (deep venous thromboembolism), chronic, left ([place]) 12/12/2021    Past Surgical History:  Procedure Laterality Date   EYE SURGERY      There were no vitals filed for this visit.   Subjective Assessment - 04/14/22 0944     Subjective Patient has a doppler study today, his toes are purple.                                          PT Short Term Goals - 03/30/22 8466       PT SHORT TERM GOAL #1   Title Pt will be I and compliant with initial HEP.    Status Achieved               PT Long Term Goals - 04/05/22 1423       PT LONG TERM GOAL #1   Title Pt will be I and compliant with long term HEP for maintenance.    Status Achieved      PT LONG TERM GOAL #2   Title Pt will demo pain free lumbar AROM with no pain.    Status Partially Met      PT LONG TERM GOAL #3   Title increase SLR to 60 degrees without pain    Status On-going      PT LONG TERM GOAL #4   Title Pt will report 75% improvement in tolerance for yard work and Artist.    Status Partially Met      PT LONG TERM GOAL #5   Title Patient to demonstrate ability to cross L ankle over R knee for improved ease of dressing.    Status On-going                   Plan - 04/14/22 0944     Clinical Impression Statement I elected to not treat today due to toes are purple  and he has a doppler study at 11, he reports playing tennis last week and having numbness in the right leg.  He reports that he was going to go to the emergency room but the numbness stopped, he then noticed toes are purple.    PT Next Visit Plan no visit today    Consulted and Agree with Plan of Care Patient             Patient will benefit from skilled therapeutic intervention in order to improve the following deficits and impairments:     Visit Diagnosis: Chronic bilateral low back pain without sciatica  Cervicalgia  Muscle weakness (generalized)  Stiffness of left hip, not elsewhere classified  Abnormal posture     Problem List Patient Active Problem List   Diagnosis Date  Noted   Leg DVT (deep venous thromboembolism), chronic, left ([place]) 12/12/2021   Mixed hyperlipidemia 11/21/2021   Low libido 11/21/2021   Low back pain 11/21/2021   Loss of appetite 11/21/2021   Hypertensive retinopathy 11/21/2021   Essential hypertension 11/21/2021   Chronic pain 11/21/2021   Enlarged prostate 06/20/2021   Degenerative disc disease, cervical 12/03/2020   Degenerative lumbar disc 09/27/2020   Degenerative arthritis of left knee 09/27/2020    Sumner Boast, PT 04/14/2022, 9:48 AM  [place]. [place], [place], [address] Phone: [phone]   Fax:  [phone]  Name: Raymond Certain Sr. MRN: [id number] Date of Birth: 02/20/1951

## 2022-04-14 NOTE — Telephone Encounter (Signed)
-----   Message from [name], MD sent at 04/14/2022 12:26 PM EDT ----- Call - there is NO blood clot in the right leg.  The INR is a little low.  Take an extra Coumadin ('5mg'$ ) daily for next 3 days.  Laurey Arrow

## 2022-04-16 ENCOUNTER — Ambulatory Visit
Admission: EM | Admit: 2022-04-16 | Discharge: 2022-04-16 | Disposition: A | Discharge status: Home / Self Care | Payer: Medicare Other | Attending: Emergency Medicine | Admitting: Emergency Medicine

## 2022-04-16 ENCOUNTER — Ambulatory Visit (INDEPENDENT_AMBULATORY_CARE_PROVIDER_SITE_OTHER): Payer: Medicare Other

## 2022-04-16 ENCOUNTER — Encounter: Payer: Self-pay | Admitting: Emergency Medicine

## 2022-04-16 DIAGNOSIS — M5431 Sciatica, right side: Secondary | ICD-10-CM | POA: Diagnosis not present

## 2022-04-16 DIAGNOSIS — M4126 Other idiopathic scoliosis, lumbar region: Secondary | ICD-10-CM | POA: Diagnosis not present

## 2022-04-16 DIAGNOSIS — I7300 Raynaud's syndrome without gangrene: Secondary | ICD-10-CM | POA: Diagnosis not present

## 2022-04-16 DIAGNOSIS — M5136 Other intervertebral disc degeneration, lumbar region: Secondary | ICD-10-CM | POA: Diagnosis not present

## 2022-04-16 DIAGNOSIS — M545 Low back pain, unspecified: Secondary | ICD-10-CM | POA: Diagnosis not present

## 2022-04-16 DIAGNOSIS — M5441 Lumbago with sciatica, right side: Secondary | ICD-10-CM | POA: Diagnosis not present

## 2022-04-16 IMAGING — DX DG LUMBAR SPINE COMPLETE 4+V
5 series · 5 of 5 positions shown · non-contrast
Comparison: Lumbar radiographs [DATE].

Lumbar MRI [DATE].

CLINICAL DATA: 71-year-old male with right sciatic nerve pain.

EXAM:
LUMBAR SPINE - COMPLETE 4+ VIEW

[lumbar spine ap]
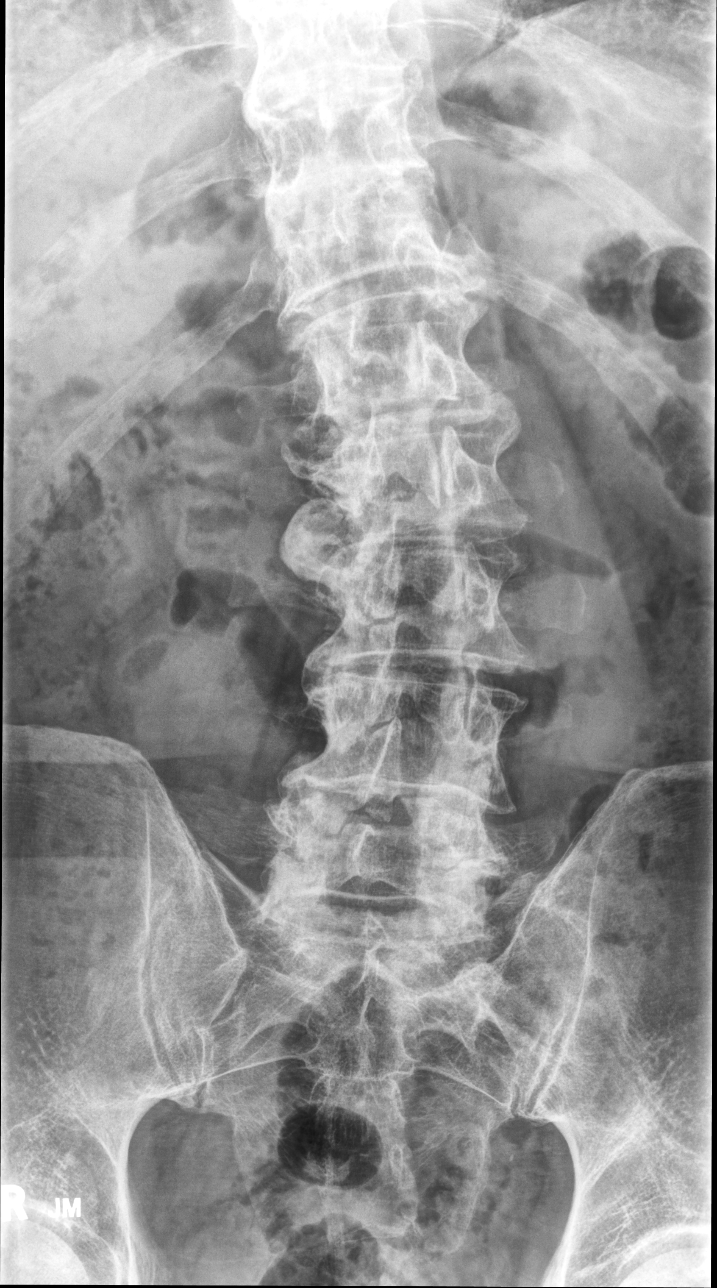

[lumbar spine rpo]
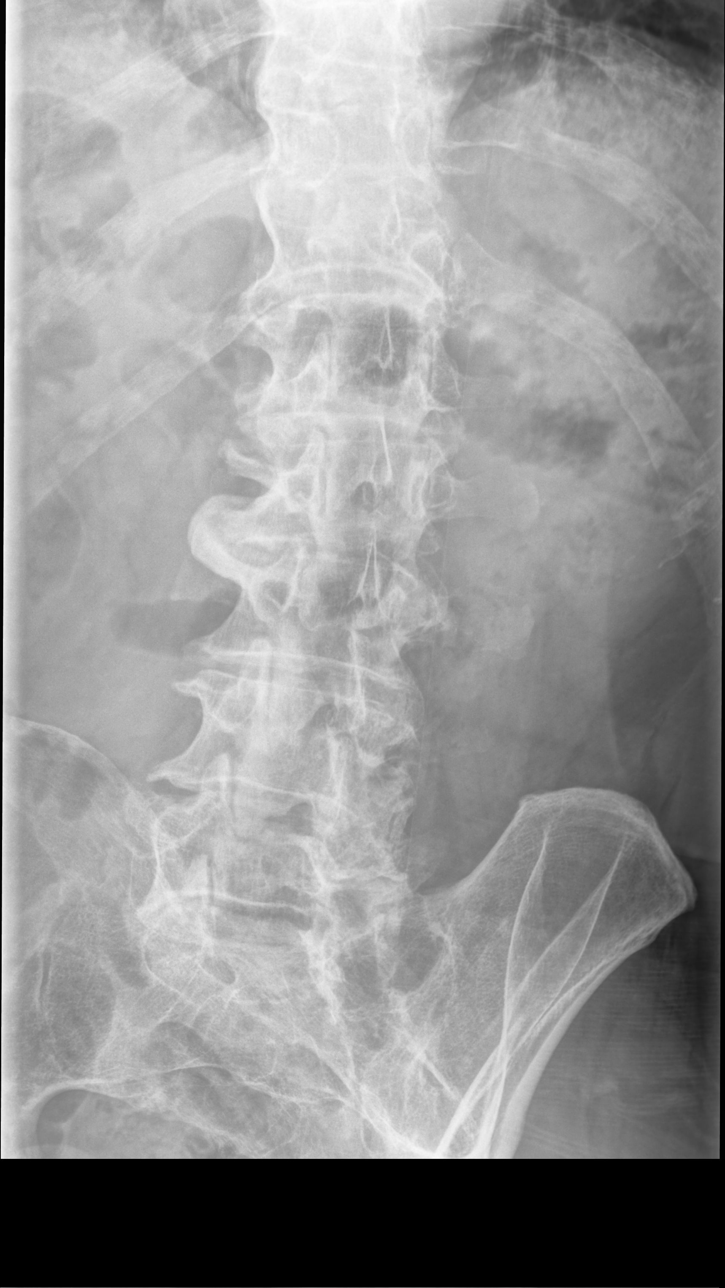

[lumbar spine lpo]
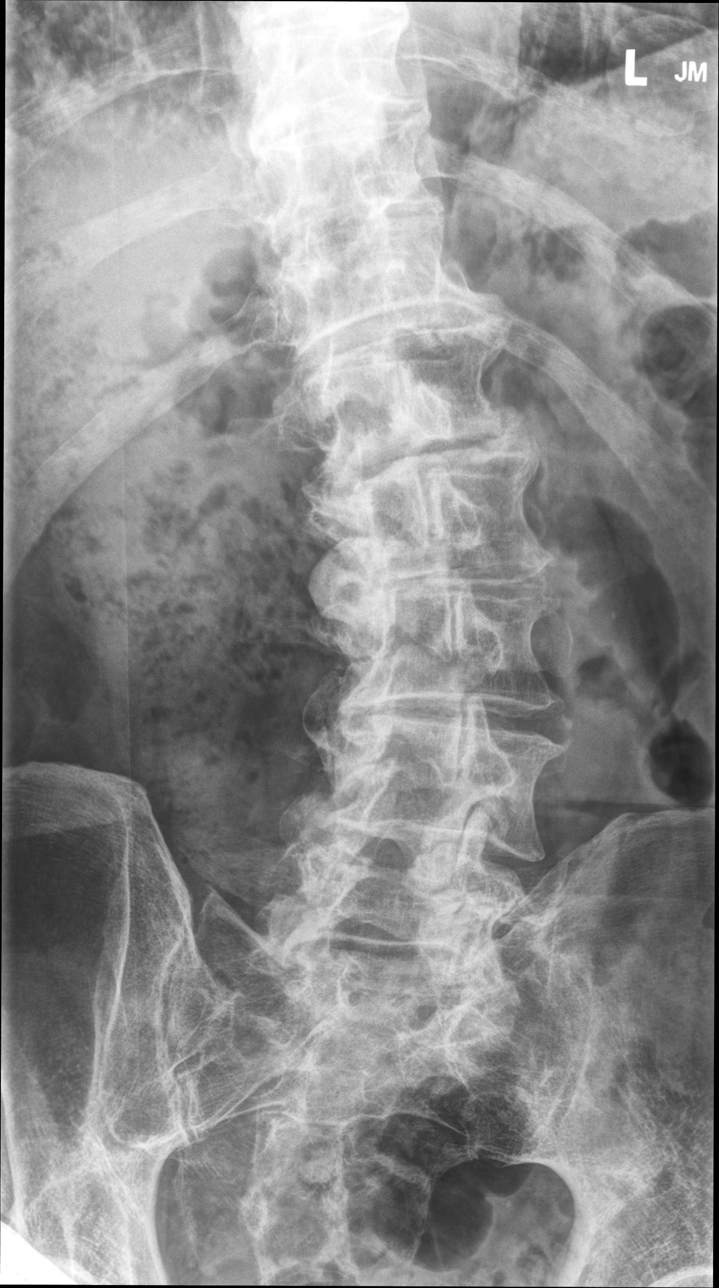

[lumbar spine lat]
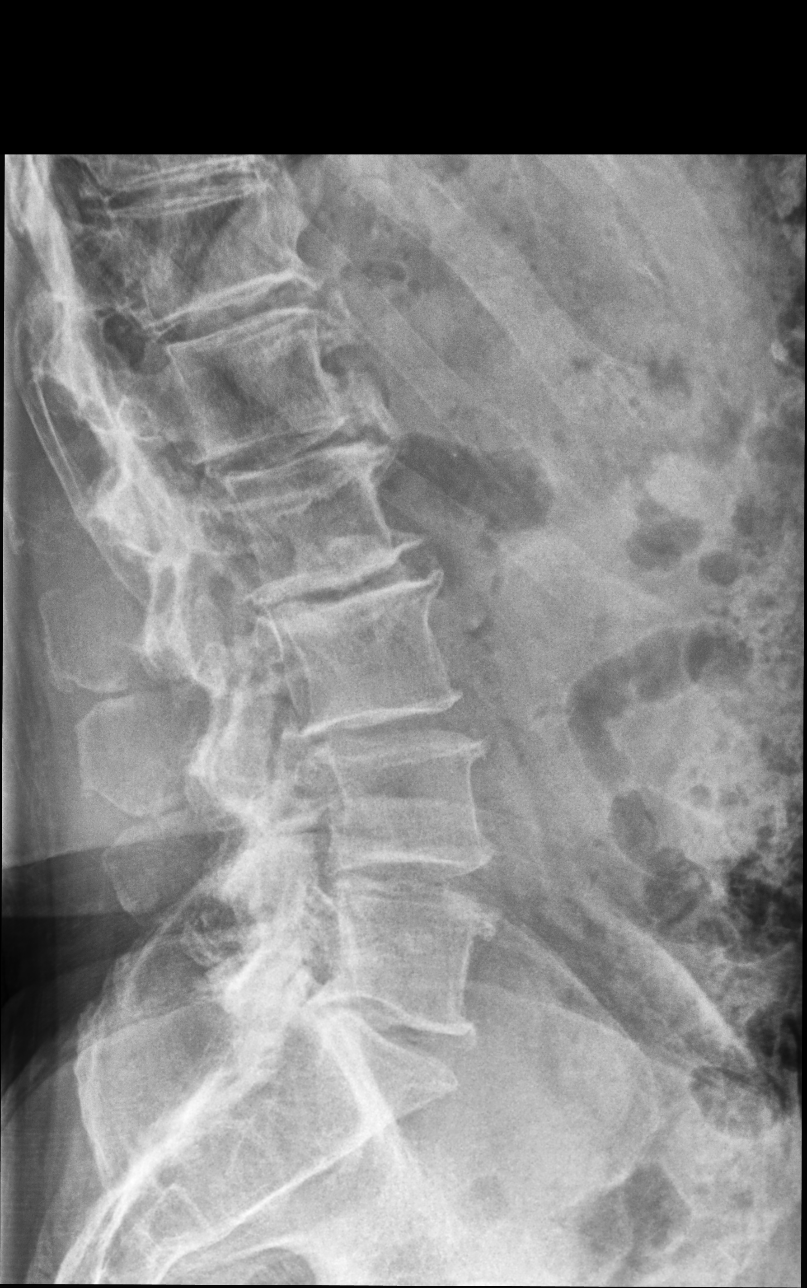

[lumbar spine l5-s1]
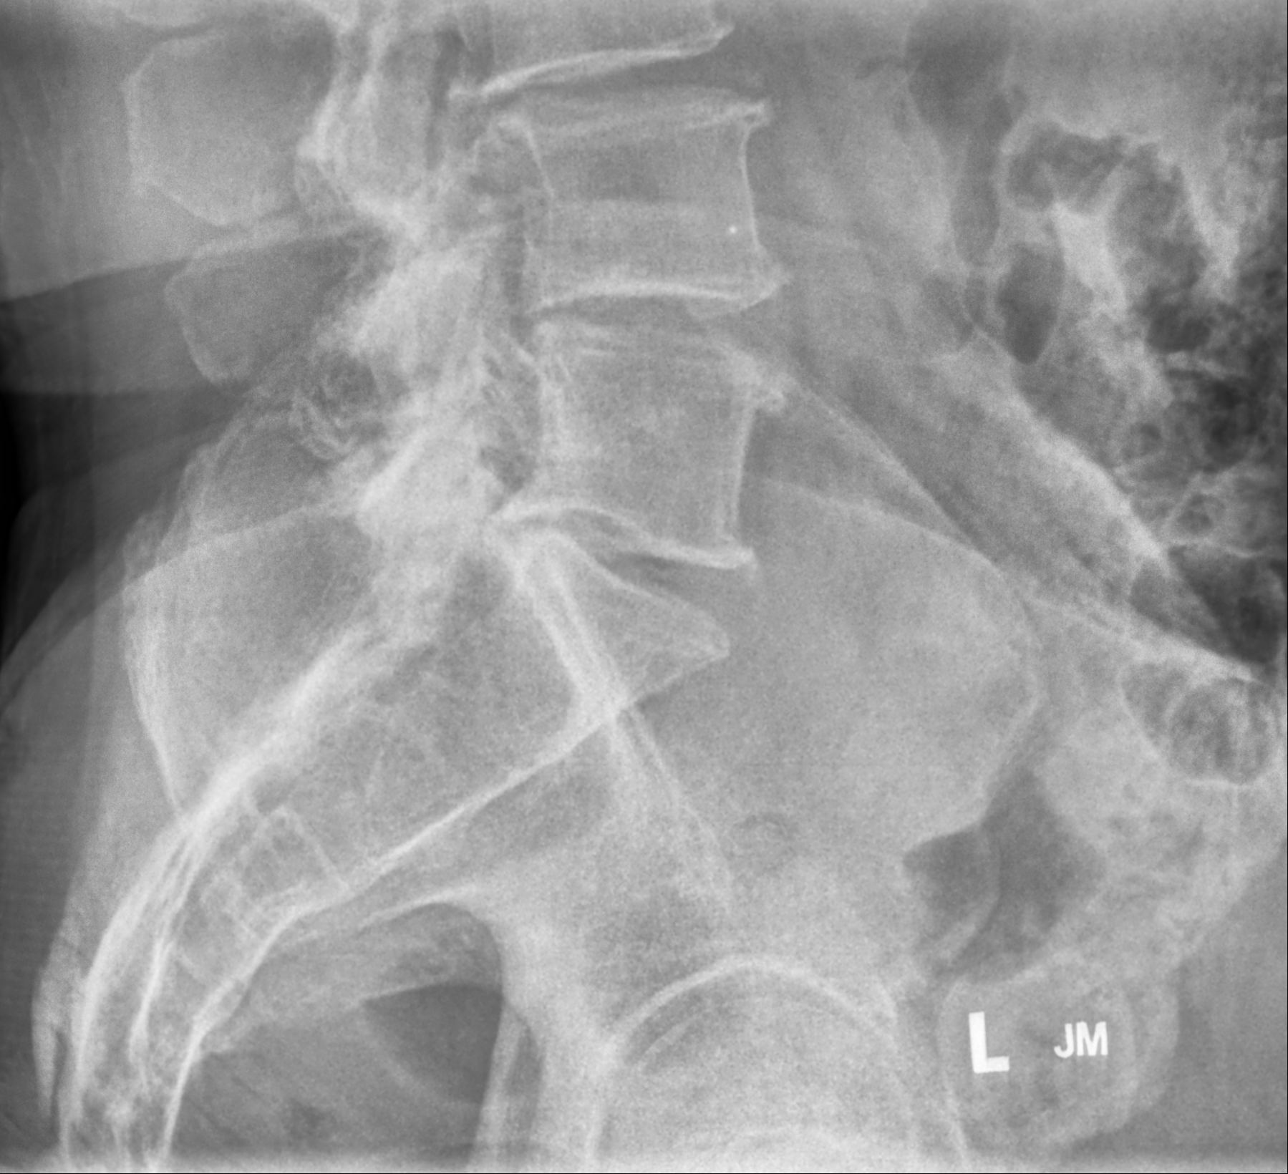

[5 of 5 positions shown; findings below may reference images not displayed]

FINDINGS: Normal lumbar segmentation. Chronic mild to moderate levoconvex
lumbar scoliosis. Stable lumbar lordosis since [PX]. Multilevel mild
chronic retrolisthesis in the lumbar spine. Widespread chronic disc
space loss and endplate spurring. Especially advanced chronic disc
and endplate disease at L2-L3. Mild to moderate chronic lower lumbar
facet hypertrophy. No definite pars fracture. No acute osseous
abnormality identified. Grossly intact visible sacrum and SI joints.
Negative visible bowel gas.
IMPRESSION: Chronic lumbar scoliosis and degeneration with no acute osseous
abnormality identified.

## 2022-04-16 MED ORDER — METHYLPREDNISOLONE 8 MG PO TABS: ORAL_TABLET | ORAL | 0 refills | Status: AC

## 2022-04-16 NOTE — ED Provider Notes (Signed)
[facility]    CSN: [id number] Arrival date & time: 04/16/22  6433    HISTORY   Chief Complaint  Patient presents with   Toe Pain   HPI Raymond [name]. is a 71 y.o. male. Patient presents to urgent care today complaining of pain in the toes of his right foot as well as discoloration that is intermittent in nature and has been occurring for the past 4 days.  Patient states he is currently taking Coumadin for a DVT in his left lower extremity.  Patient states that since this issue occurred in his right foot, he has had an ultrasound of his right leg which did not be reveal any blood clot.  Patient states that he has been seen regularly by hematology oncology for the DVT in his left lower extremity.  Patient states his most recent INR was 1.9 and he was advised to double his dose of Coumadin for 3 days due to this low INR.  Patient states that tonight will be his third doubled dose and that he anticipates repeat INR early next week.  Patient reports a recent death of his wife and continued grief over her death.  Patient states he is also recently resumed playing tennis is a part of the process of recovering from his grief.  Patient is tearful during visit today when he talks about this.  The history is provided by the patient.  Toe Pain  Past Medical History:  Diagnosis Date   GERD (gastroesophageal reflux disease)    Hyperlipidemia    Hypertension    Leg DVT (deep venous thromboembolism), chronic, left ([place]) 12/12/2021   Patient Active Problem List   Diagnosis Date Noted   Leg DVT (deep venous thromboembolism), chronic, left ([place]) 12/12/2021   Mixed hyperlipidemia 11/21/2021   Low libido 11/21/2021   Low back pain 11/21/2021   Loss of appetite 11/21/2021   Hypertensive retinopathy 11/21/2021   Essential hypertension 11/21/2021   Chronic pain 11/21/2021   Enlarged prostate 06/20/2021   Degenerative disc disease, cervical 12/03/2020   Degenerative lumbar disc  09/27/2020   Degenerative arthritis of left knee 09/27/2020   Past Surgical History:  Procedure Laterality Date   EYE SURGERY      Home Medications    Prior to Admission medications   Medication Sig Start Date End Date Taking? Authorizing Provider  methylPREDNISolone (MEDROL) 8 MG tablet Take 4 tablets (32 mg total) by mouth daily for 2 days, THEN 3 tablets (24 mg total) daily for 2 days, THEN 2 tablets (16 mg total) daily for 2 days, THEN 1 tablet (8 mg total) daily for 2 days. 04/16/22 04/24/22 Yes Lynden Oxford Scales, PA-C  atorvastatin (LIPITOR) 10 MG tablet Take 1 tablet (10 mg total) by mouth daily. 01/06/22   Marrian Salvage, FNP  cholecalciferol (VITAMIN D3) 25 MCG (1000 UNIT) tablet Take 1,000 Units by mouth daily.    [provider]  fluoruracil (FLUOROPLEX) 1 % cream 1 application to affected area    [provider]  gabapentin (NEURONTIN) 100 MG capsule Take 200 mg by mouth daily. 02/20/22   [provider]  hydrochlorothiazide (MICROZIDE) 12.5 MG capsule Take 1 capsule (12.5 mg total) by mouth daily. 07/14/21   Marrian Salvage, FNP  hydrocortisone cream 1 % Apply 1 application topically 2 (two) times daily.    [provider]  losartan (COZAAR) 50 MG tablet Take 1 tablet (50 mg total) by mouth daily. 07/14/21   Marrian Salvage,  FNP  naproxen sodium (ALEVE) 220 MG tablet Take 220 mg by mouth as needed. Takes before playing tennis.    [provider]  Turmeric 500 MG CAPS 1 capsule    [provider]  warfarin (COUMADIN) 5 MG tablet Take 1 tablet (5 mg total) by mouth daily. 04/14/22   [name], MD    Family History Family History  Problem Relation Age of Onset   Alcohol abuse Mother    Arthritis Mother    Diabetes Mother    Mental retardation Mother    Alcohol abuse Father    Hypertension Father    Cancer Sister    Depression Sister    Cancer Sister    Arthritis Maternal Grandmother     Parkinson's disease Maternal Grandfather    Social History Social History   Tobacco Use   Smoking status: Never   Allergies   Penicillins and Penicillin v potassium  Review of Systems Review of Systems Pertinent findings noted in history of present illness.   Physical Exam Triage Vital Signs ED Triage Vitals  Enc Vitals Group     BP 09/09/21 0827 (!) 147/82     Pulse Rate 09/09/21 0827 72     Resp 09/09/21 0827 18     Temp 09/09/21 0827 98.3 F (36.8 C)     Temp Source 09/09/21 0827 Oral     SpO2 09/09/21 0827 98 %     Weight --      Height --      Head Circumference --      Peak Flow --      Pain Score 09/09/21 0826 5     Pain Loc --      Pain Edu? --      Excl. in [place]? --   No data found.  Updated Vital Signs BP 126/60   Pulse 61   Temp (!) 97.5 F (36.4 C)   Resp 20   SpO2 98%   Physical Exam Vitals and nursing note reviewed.  Constitutional:      General: He is not in acute distress.    Appearance: Normal appearance. He is not ill-appearing.  HENT:     Head: Normocephalic and atraumatic.  Eyes:     General: Lids are normal.        Right eye: No discharge.        Left eye: No discharge.     Extraocular Movements: Extraocular movements intact.     Conjunctiva/sclera: Conjunctivae normal.     Right eye: Right conjunctiva is not injected.     Left eye: Left conjunctiva is not injected.  Neck:     Trachea: Trachea and phonation normal.  Cardiovascular:     Rate and Rhythm: Normal rate and regular rhythm.     Pulses: Normal pulses.     Heart sounds: Normal heart sounds. No murmur heard.   No friction rub. No gallop.  Pulmonary:     Effort: Pulmonary effort is normal. No accessory muscle usage, prolonged expiration or respiratory distress.     Breath sounds: Normal breath sounds. No stridor, decreased air movement or transmitted upper airway sounds. No decreased breath sounds, wheezing, rhonchi or rales.  Chest:     Chest wall: No tenderness.   Musculoskeletal:     Cervical back: Normal, normal range of motion and neck supple.     Thoracic back: Normal.     Lumbar back: Spasms, tenderness and bony tenderness present. Decreased range of motion. Positive  right straight leg raise test and positive left straight leg raise test.     Right lower leg: No edema.     Left lower leg: No edema.  Lymphadenopathy:     Cervical: No cervical adenopathy.  Skin:    General: Skin is warm and dry.     Capillary Refill: Capillary refill takes more than 3 seconds. Capillary refill at the tips of right second through fourth toes greater than 5 seconds    Findings: No erythema or rash.     Comments: See photo below  Neurological:     General: No focal deficit present.     Mental Status: He is alert and oriented to person, place, and time.  Psychiatric:        Mood and Affect: Mood normal.        Behavior: Behavior normal.       Visual Acuity Right Eye Distance:   Left Eye Distance:   Bilateral Distance:    Right Eye Near:   Left Eye Near:    Bilateral Near:     [facility] / Diagnostics / Procedures:    EKG  Radiology DG Lumbar Spine Complete  Result Date: 04/16/2022 CLINICAL DATA:  71 year old male with right sciatic nerve pain. EXAM: LUMBAR SPINE - COMPLETE 4+ VIEW COMPARISON:  Lumbar radiographs 09/27/2020. Lumbar MRI 05/04/2021. FINDINGS: Normal lumbar segmentation. Chronic mild to moderate levoconvex lumbar scoliosis. Stable lumbar lordosis since 2021. Multilevel mild chronic retrolisthesis in the lumbar spine. Widespread chronic disc space loss and endplate spurring. Especially advanced chronic disc and endplate disease at O2-U2. Mild to moderate chronic lower lumbar facet hypertrophy. No definite pars fracture. No acute osseous abnormality identified. Grossly intact visible sacrum and SI joints. Negative visible bowel gas. IMPRESSION: Chronic lumbar scoliosis and degeneration with no acute osseous abnormality identified.  Electronically Signed   By: Genevie Ann M.D.   On: 04/16/2022 09:23    Procedures Procedures (including critical care time)  UC Diagnoses / Final Clinical Impressions(s)   I have reviewed the triage vital signs and the nursing notes.  Pertinent labs & imaging results that were available during my care of the patient were reviewed by me and considered in my medical decision making (see chart for details).    Final diagnoses:  Right sided sciatica  Lumbar degenerative disc disease  Raynaud's phenomenon (secondary)   Per radiology report there was no meaningful change in his appearance of the lumbar spine on x-ray compared to previous x-ray performed in 2021.  Per personal review, patient does seem to have some increase inflammation of bones and mild mildly decreased disc space.  Patient advised I recommend a tapering dose of methylprednisolone to reduce his sciatic nerve pain which is likely causing the pain down the right side of his leg.  Patient advised of possibility of having Raynaud's phenomenon and that he should either follow-up with Dr. Marin Olp his hematologist or consider referral to neurology for further evaluation of this.  Patient agreed with plan as described.  Return precautions advised.  ED Prescriptions     Medication Sig Dispense Auth. Provider   methylPREDNISolone (MEDROL) 8 MG tablet Take 4 tablets (32 mg total) by mouth daily for 2 days, THEN 3 tablets (24 mg total) daily for 2 days, THEN 2 tablets (16 mg total) daily for 2 days, THEN 1 tablet (8 mg total) daily for 2 days. 20 tablet Lynden Oxford Scales, PA-C      PDMP not reviewed this encounter.  Pending results:  Labs Reviewed - No data to display  Medications Ordered in UC: Medications - No data to display  Disposition Upon Discharge:  Condition: stable for discharge home Home: take medications as prescribed; routine discharge instructions as discussed; follow up as advised.  Patient presented with an acute  illness with associated systemic symptoms and significant discomfort requiring urgent management. In my opinion, this is a condition that a prudent lay person (someone who possesses an average knowledge of health and medicine) may potentially expect to result in complications if not addressed urgently such as respiratory distress, impairment of bodily function or dysfunction of bodily organs.   Routine symptom specific, illness specific and/or disease specific instructions were discussed with the patient and/or caregiver at length.   As such, the patient has been evaluated and assessed, work-up was performed and treatment was provided in alignment with urgent care protocols and evidence based medicine.  Patient/parent/caregiver has been advised that the patient may require follow up for further testing and treatment if the symptoms continue in spite of treatment, as clinically indicated and appropriate.  Patient/parent/caregiver has been advised to return to the [facility] or PCP if no better; to PCP or the Emergency Department if new signs and symptoms develop, or if the current signs or symptoms continue to change or worsen for further workup, evaluation and treatment as clinically indicated and appropriate  The patient will follow up with their current PCP if and as advised. If the patient does not currently have a PCP we will assist them in obtaining one.   The patient may need specialty follow up if the symptoms continue, in spite of conservative treatment and management, for further workup, evaluation, consultation and treatment as clinically indicated and appropriate.   Patient/parent/caregiver verbalized understanding and agreement of plan as discussed.  All questions were addressed during visit.  Please see discharge instructions below for further details of plan.  Discharge Instructions:   Discharge Instructions      Please read the enclosed information about Raynaud's phenomenon, I believe  that you have a secondary version and would benefit from a rheumatology work-up.  For your sciatic nerve pain on the right side, I believe that an 8 day taper of methylprednisolone will calm the inflammation in that nerve and reduce your pain.  Please talk to Dr. Marin Olp about further work-up for Raynaud's phenomenon, I believe that you do meet a lot of the criteria but I just leave that to the experts.  Thank you for visiting urgent care today.  I appreciate the opportunity to participate in your care.    This office note has been dictated using Museum/gallery curator.  Unfortunately, and despite my best efforts, this method of dictation can sometimes lead to occasional typographical or grammatical errors.  I apologize in advance if this occurs.     Lynden Oxford Scales, PA-C 04/16/22 2048

## 2022-04-16 NOTE — ED Triage Notes (Signed)
Pt here with pain above toes on right foot as well as discoloration that is intermittent in nature x X 4 days. Pt is on Coumadin. Pt was worked up at the hospital for blood clot, but no evidence was found.

## 2022-04-16 NOTE — Discharge Instructions (Addendum)
Please read the enclosed information about Raynaud's phenomenon, I believe that you have a secondary version and would benefit from a rheumatology work-up.  For your sciatic nerve pain on the right side, I believe that an 8 day taper of methylprednisolone will calm the inflammation in that nerve and reduce your pain.  Please talk to Dr. Marin Olp about further work-up for Raynaud's phenomenon, I believe that you do meet a lot of the criteria but I just leave that to the experts.  Thank you for visiting urgent care today.  I appreciate the opportunity to participate in your care.

## 2022-04-17 ENCOUNTER — Ambulatory Visit: Payer: Medicare Other | Admitting: Physical Therapy

## 2022-04-17 ENCOUNTER — Telehealth: Payer: Self-pay | Admitting: Family Medicine

## 2022-04-17 NOTE — Telephone Encounter (Signed)
Patient called asking to speak to Dr Thompson Caul assistant. He was seen at [facility] for toe and foot pain and discoloration and was told to call us to go over what was discussed.  Please advise.

## 2022-04-17 NOTE — Telephone Encounter (Signed)
Sounds like Raynaud's phenomenon This could be secondary to the weather change or it could be secondary to a medication if any of them are new.  Patient did have Doppler done in the emergency room that was negative for DVT. Do not think doubling of his Coumadin would be a great idea if not being checked in the near future.  Would consider the possibility of starting amlodipine which could help with blood flow to the leg if you would like at 2.5 mg.

## 2022-04-17 NOTE — Telephone Encounter (Signed)
Spoke with patient. Discussed recommendations. Sent MyChart message

## 2022-04-17 NOTE — Telephone Encounter (Signed)
Received phone call from patient wanting to give Dr. Marin Olp an update on his right leg pain over the weekend. He stated,"I went to an Urgent Care yesterday because my foot was changing blue to purplish colors again. The pain is spontaneous and sporadic. The pain is always there when lying down at night. I have to get up and walk around the house to shake it off. The pain usually last 3-4 minutes, and then it is gone. The doctor at Urgent Care did some imaging studies and said I have right sided sciatica and that could be contributing to the pain. Also, he did not rule out Raynaud's Phenomenon secondary to sciatica. He ordered a Medrol dosepak to help decrease the inflammation. However, [facility] had to order it and it won't be in until Wednesday of this week." I told him I would give this information to Dr. Marin Olp. I told him to continue taking Coumadin '5mg'$  po daily and start the Medrol Dosepak as soon as he picks it up.  Also, you need to call your PCP and see if they can see you. Please keep your scheduled appointments with Dr. Marin Olp for June 16th. He verbalized understanding.

## 2022-04-18 NOTE — Progress Notes (Unsigned)
[facility] [place] Phone: [phone] Subjective:   [name], am serving as a scribe for Dr. Hulan Saas.   I'm seeing this patient by the request  of:  Marrian Salvage, FNP  CC: Low back and leg pain  [id number]  03/30/2022 Arthritic changes noted of the neck as well.  Responding to physical therapy and traction.  Continue gabapentin regularly follow-up again in 3 months total time reviewing physical therapy notes, primary care notes, recent labs 33 minutes with office visit today.  Patient has low back pain.  Discussed icing regimen and home exercises.  Very difficult to do anything else now secondary to patient having a blood thinner.  Encouraged him to avoid anti-inflammatories.  Did discuss with patient to try to take the gabapentin on a more regular basis.  Patient will increase activity slowly otherwise.  We will follow-up with me again  Update 04/19/2022 [name] is a 71 y.o. male coming in with complaint of back and neck pain. Continued pain in calves sent patient to ED recently. Patient states that his back has been doing ok. Was diagnosed with R sided sciatica. Pain located in R foot mostly in great toe. Pain worse at night. Has not slept more than 4 hours a night. Was suppose to start Medrol but has not yet because his pharmacy did not have medication. Patient was told that he has Raynauds syndrome and this runs in his family. Feels like is under a lot of stress. Using '400mg'$  of gabapentin QHS.  Patient states that it does seem on the gabapentin to help a little bit.  Patient though still has pain that is unrelenting symptoms.  Does have swelling of the foot and can have different discolorations including blue, red and white.  States that when it does swell sometimes it can be severely irritating.  Strong family history of Raynaud's.      Past Medical History:  Diagnosis Date   GERD  (gastroesophageal reflux disease)    Hyperlipidemia    Hypertension    Leg DVT (deep venous thromboembolism), chronic, left ([place]) 12/12/2021   Past Surgical History:  Procedure Laterality Date   EYE SURGERY     Social History   Socioeconomic History   Marital status: Widowed    Spouse name: Not on file   Number of children: Not on file   Years of education: Not on file   Highest education level: Not on file  Occupational History   Not on file  Tobacco Use   Smoking status: Never   Smokeless tobacco: Not on file  Substance and Sexual Activity   Alcohol use: Not on file   Drug use: Not on file   Sexual activity: Yes  Other Topics Concern   Not on file  Social History Narrative   Not on file   Social Determinants of Health   Financial Resource Strain: Not on file  Food Insecurity: Not on file  Transportation Needs: Not on file  Physical Activity: Not on file  Stress: Not on file  Social Connections: Not on file   Allergies  Allergen Reactions   Penicillins Hives and Rash    Severe Rash    Penicillin V Potassium Other (See Comments)   Family History  Problem Relation Age of Onset   Alcohol abuse Mother    Arthritis Mother    Diabetes Mother    Mental retardation Mother    Alcohol  abuse Father    Hypertension Father    Cancer Sister    Depression Sister    Cancer Sister    Arthritis Maternal Grandmother    Parkinson's disease Maternal Grandfather     Current Outpatient Medications (Endocrine & Metabolic):    methylPREDNISolone (MEDROL) 8 MG tablet, Take 4 tablets (32 mg total) by mouth daily for 2 days, THEN 3 tablets (24 mg total) daily for 2 days, THEN 2 tablets (16 mg total) daily for 2 days, THEN 1 tablet (8 mg total) daily for 2 days.  Current Outpatient Medications (Cardiovascular):    amLODipine (NORVASC) 2.5 MG tablet, Take 1 tablet (2.5 mg total) by mouth daily.   atorvastatin (LIPITOR) 10 MG tablet, Take 1 tablet (10 mg total) by mouth daily.    hydrochlorothiazide (MICROZIDE) 12.5 MG capsule, Take 1 capsule (12.5 mg total) by mouth daily.   losartan (COZAAR) 50 MG tablet, Take 1 tablet (50 mg total) by mouth daily.   Current Outpatient Medications (Analgesics):    naproxen sodium (ALEVE) 220 MG tablet, Take 220 mg by mouth as needed. Takes before playing tennis.  Current Outpatient Medications (Hematological):    warfarin (COUMADIN) 5 MG tablet, Take 1 tablet (5 mg total) by mouth daily.  Current Outpatient Medications (Other):    cholecalciferol (VITAMIN D3) 25 MCG (1000 UNIT) tablet, Take 1,000 Units by mouth daily.   fluoruracil (FLUOROPLEX) 1 % cream, 1 application to affected area   gabapentin (NEURONTIN) 100 MG capsule, Take 200 mg by mouth daily.   hydrocortisone cream 1 %, Apply 1 application topically 2 (two) times daily.   Turmeric 500 MG CAPS, 1 capsule   Reviewed prior external information including notes and imaging from  primary care provider As well as notes that were available from care everywhere and other healthcare systems.  Reviewed patient's emergency room visit showing the patient did have no recurrent DVT.  Reviewed patient's previous MRI that did show patient did have an L5-S1 disc protrusion causing a nerve root impingement on the S1 nerve root.  Past medical history, social, surgical and family history all reviewed in electronic medical record.  No pertanent information unless stated regarding to the chief complaint.   Review of Systems:  No headache, visual changes, nausea, vomiting, diarrhea, constipation, dizziness, abdominal pain, skin rash, fevers, chills, night sweats, weight loss, swollen lymph nodes, body aches, joint swelling, chest pain, shortness of breath, mood changes. POSITIVE muscle aches  Objective  Blood pressure 126/86, pulse 73, height '5\' 11"'$  (1.803 m), weight 181 lb (82.1 kg), SpO2 96 %.   General: No apparent distress alert and oriented x3 mood and affect normal, dressed  appropriately.  HEENT: Pupils equal, extraocular movements intact  Respiratory: Patient's speak in full sentences and does not appear short of breath  Cardiovascular: No lower extremity edema, non tender, no erythema  Gait normal with good balance and coordination.  MSK: Back exam does have some loss of lordosis. Does have worsening pain with straight leg test on the right side.  Foot exam no x-rays show that patient does not have full to touch of the toes.  Patient does have a very mild mottling of the skin mostly over the first second and third toes.  Dorsalis pedis pulse though it is felt and appropriate.  No pain in the popliteal area at the moment.    Impression and Recommendations:     The above documentation has been reviewed and is accurate and complete Lyndal Pulley, DO

## 2022-04-19 ENCOUNTER — Ambulatory Visit: Payer: Medicare Other | Admitting: Physical Therapy

## 2022-04-19 ENCOUNTER — Encounter: Payer: Self-pay | Admitting: Family Medicine

## 2022-04-19 ENCOUNTER — Ambulatory Visit (INDEPENDENT_AMBULATORY_CARE_PROVIDER_SITE_OTHER): Payer: Medicare Other | Admitting: Family Medicine

## 2022-04-19 DIAGNOSIS — M5136 Other intervertebral disc degeneration, lumbar region: Secondary | ICD-10-CM | POA: Diagnosis not present

## 2022-04-19 DIAGNOSIS — I82502 Chronic embolism and thrombosis of unspecified deep veins of left lower extremity: Secondary | ICD-10-CM

## 2022-04-19 MED ORDER — AMLODIPINE BESYLATE 2.5 MG PO TABS: 2.5000 mg | ORAL_TABLET | Freq: Every day | ORAL | 3 refills | Status: DC

## 2022-04-19 NOTE — Assessment & Plan Note (Signed)
Seeing his hematologist in 10 days and if needed we may need to discuss bridging the Coumadin for a epidural or nerve root injection

## 2022-04-19 NOTE — Assessment & Plan Note (Signed)
Patient has severe arthritic changes of the back noted.  Patient has had a protruding disc on MRI previously that was causing nerve root impingement that would be consistent with his right leg and this could be an exacerbation.  We could consider the possibility of prednisone but with patient being on Coumadin I do not know if it would make improvement in considering mobile mass of his INR fairly severe.  At this moment we will start amlodipine with him being relatively stable.  Depending on how the amlodipine responds and depending what he discussed with his hematologist in the near future we could consider the possibility of nerve root injection.  Continue the gabapentin at the dosing you are noticing.  Patient will watch for any signs and symptoms consistent with low blood pressure as well.  Follow-up with me again in 6 to 8 weeks

## 2022-04-19 NOTE — Patient Instructions (Addendum)
Good to see you! Amlodipine 2.'5mg'$  daily see how it does over the next 10-14 days After talking to hemo could consider nerve root injection in back if needed See you again in 4-5 weeks

## 2022-04-24 ENCOUNTER — Telehealth: Payer: Self-pay | Admitting: Family Medicine

## 2022-04-24 NOTE — Telephone Encounter (Signed)
Spoke to patient and have Dr Thompson Caul recommendations. He expressed understanding but said that he did have other questions. If someone could call him on Tuesday.

## 2022-04-24 NOTE — Telephone Encounter (Signed)
Patient called stating that he thinks he is having a reaction to a new medication that he was prescribed by Dr Tamala Julian.  Patient states that he has been experiencing changes since beginning the Amlodipine 2.'5mg'$  prescription in regards to his right foot. He said that he has been having much more intense pain in his right foot, especially at night. He said that it feels like stabbing pain.  His foot is very inflamed, and he is not able to get a shoe on. He states that his foot also feels cold to the touch. He said that when he gets up and walks, some of the coldness does go away.  He also states that is is very painful to walk.  He did not know if this could be related to medication or not but has discontinued at this time.  He asked to talk to Dr Tamala Julian or one of his assistants. I told him that they were out of the office today but offered for him to speak to someone else. He said that with his history and other issues, he would prefer to wait for Dr Tamala Julian and talk to them tomorrow. If worsening symptoms, he will go to the emergency room.  Please advise.

## 2022-04-24 NOTE — Telephone Encounter (Signed)
Yes tell him to discontinue the medication.  It can cause lower extremity swelling and maybe that is contributing.  If so then would need to elevate foot above heart and compression socks for some days and should resolve.  Worsening pain, swelling or coldness need to be seen immediately. Would like update again in 1-2 days

## 2022-04-25 ENCOUNTER — Other Ambulatory Visit: Payer: Self-pay

## 2022-04-25 ENCOUNTER — Ambulatory Visit: Payer: Medicare Other | Admitting: Physical Therapy

## 2022-04-25 DIAGNOSIS — M25562 Pain in left knee: Secondary | ICD-10-CM

## 2022-04-25 NOTE — Telephone Encounter (Signed)
Spoke with patient. He will go into ED if symptoms worsen. Per a verbal from Dr. Tamala Julian patient is to add Tylenol '325mg'$ , 2-3x daily for pain relief. Orders placed and they will notify patient regarding appts. Patient voices understanding.

## 2022-04-25 NOTE — Telephone Encounter (Signed)
NCS/EMG And ABI please order   If truly getting worse once again he would need to be seen if the blue stay around or darkening

## 2022-04-27 ENCOUNTER — Telehealth: Payer: Self-pay | Admitting: Family Medicine

## 2022-04-27 ENCOUNTER — Emergency Department (HOSPITAL_BASED_OUTPATIENT_CLINIC_OR_DEPARTMENT_OTHER): Payer: Medicare Other

## 2022-04-27 ENCOUNTER — Encounter (HOSPITAL_BASED_OUTPATIENT_CLINIC_OR_DEPARTMENT_OTHER): Payer: Self-pay | Admitting: Urology

## 2022-04-27 ENCOUNTER — Other Ambulatory Visit: Payer: Self-pay

## 2022-04-27 ENCOUNTER — Emergency Department (HOSPITAL_BASED_OUTPATIENT_CLINIC_OR_DEPARTMENT_OTHER)
Admission: EM | Admit: 2022-04-27 | Discharge: 2022-04-27 | Disposition: A | Discharge status: Home / Self Care | Payer: Medicare Other | Attending: Emergency Medicine | Admitting: Emergency Medicine

## 2022-04-27 DIAGNOSIS — N400 Enlarged prostate without lower urinary tract symptoms: Secondary | ICD-10-CM | POA: Diagnosis not present

## 2022-04-27 DIAGNOSIS — M79671 Pain in right foot: Secondary | ICD-10-CM | POA: Diagnosis present

## 2022-04-27 DIAGNOSIS — I7300 Raynaud's syndrome without gangrene: Secondary | ICD-10-CM | POA: Diagnosis not present

## 2022-04-27 DIAGNOSIS — M79672 Pain in left foot: Secondary | ICD-10-CM

## 2022-04-27 DIAGNOSIS — K5730 Diverticulosis of large intestine without perforation or abscess without bleeding: Secondary | ICD-10-CM | POA: Diagnosis not present

## 2022-04-27 DIAGNOSIS — Z79899 Other long term (current) drug therapy: Secondary | ICD-10-CM | POA: Insufficient documentation

## 2022-04-27 DIAGNOSIS — I724 Aneurysm of artery of lower extremity: Secondary | ICD-10-CM | POA: Diagnosis not present

## 2022-04-27 DIAGNOSIS — I10 Essential (primary) hypertension: Secondary | ICD-10-CM | POA: Diagnosis not present

## 2022-04-27 DIAGNOSIS — I770 Arteriovenous fistula, acquired: Secondary | ICD-10-CM | POA: Diagnosis not present

## 2022-04-27 LAB — BASIC METABOLIC PANEL
Anion gap: 9 (ref 5–15)
BUN: 24 mg/dL — ABNORMAL HIGH (ref 8–23)
CO2: 27 mmol/L (ref 22–32)
Calcium: 9.1 mg/dL (ref 8.9–10.3)
Chloride: 102 mmol/L (ref 98–111)
Creatinine, Ser: 0.89 mg/dL (ref 0.61–1.24)
GFR, Estimated: >60 mL/min (ref >60)
Glucose, Bld: 88 mg/dL (ref 70–99)
Potassium: 3.8 mmol/L (ref 3.5–5.1)
Sodium: 138 mmol/L (ref 135–145)

## 2022-04-27 LAB — CBC
HCT: 45.5 % (ref 39.0–52.0)
Hemoglobin: 15.1 g/dL (ref 13.0–17.0)
MCH: 32.3 pg (ref 26.0–34.0)
MCHC: 33.2 g/dL (ref 30.0–36.0)
MCV: 97.4 fL (ref 80.0–100.0)
Platelets: 269 10*3/uL (ref 150–400)
RBC: 4.67 MIL/uL (ref 4.22–5.81)
RDW: 12.6 % (ref 11.5–15.5)
WBC: 9.5 10*3/uL (ref 4.0–10.5)
nRBC: 0 % (ref 0.0–0.2)

## 2022-04-27 LAB — PROTIME-INR
INR: 1.8 — ABNORMAL HIGH (ref 0.8–1.2)
Prothrombin Time: 20.6 seconds — ABNORMAL HIGH (ref 11.4–15.2)

## 2022-04-27 LAB — CK: Total CK: 138 U/L (ref 49–397)

## 2022-04-27 IMAGING — CT CT ANGIO AOBIFEM WO/W CM
1 of 11 series · 13 of 46 positions shown, 17 images · non-contrast
Comparison: None Available.

CLINICAL DATA: Claudication or leg ischemia RIGHT

EXAM:
CT ANGIOGRAPHY OF ABDOMINAL AORTA WITH ILIOFEMORAL RUNOFF
TECHNIQUE: Multidetector CT imaging of the abdomen, pelvis and lower
extremities was performed using the standard protocol during bolus
administration of intravenous contrast. Multiplanar CT image
reconstructions and MIPs were obtained to evaluate the vascular
anatomy.

[Series 6: runoff axial arterial · axial · arterial · 0.88mm/px · z∈[-1275,-15]mm · 13 of 470 slices shown, 17 images]
[im 33/470  soft-tissue]
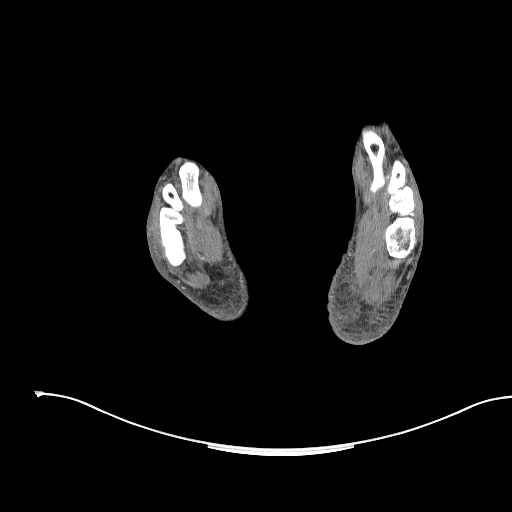
[im 33/470  bone]
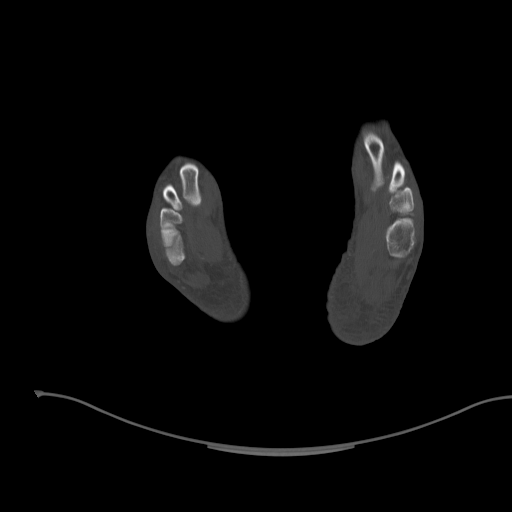
[im 65/470  soft-tissue]
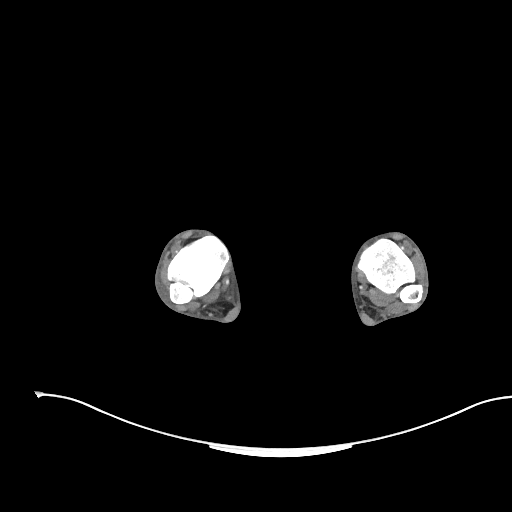
[im 114/470  soft-tissue]
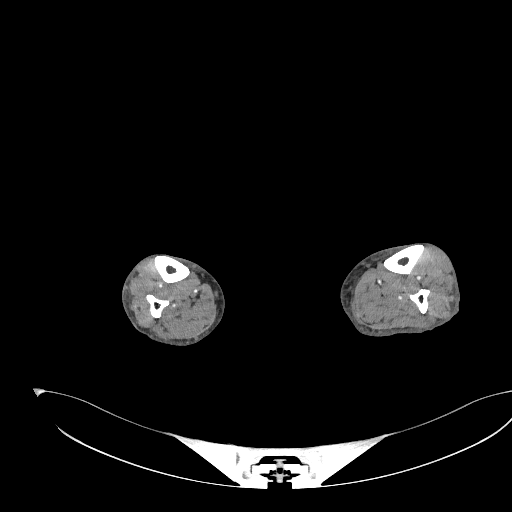
[im 146/470  soft-tissue]
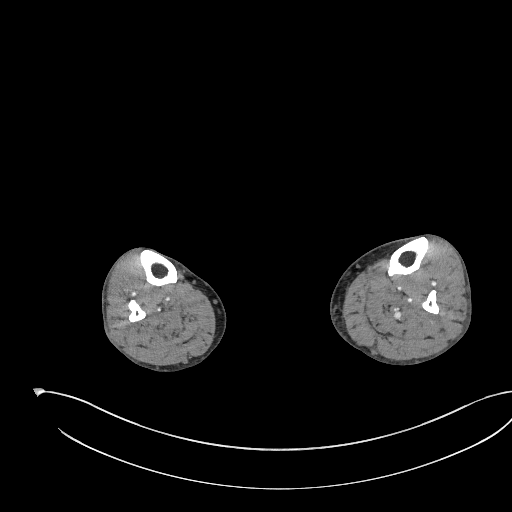
[im 195/470  soft-tissue]
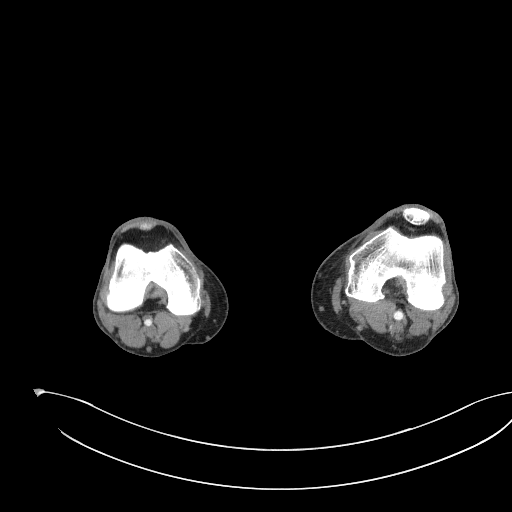
[im 243/470  soft-tissue]
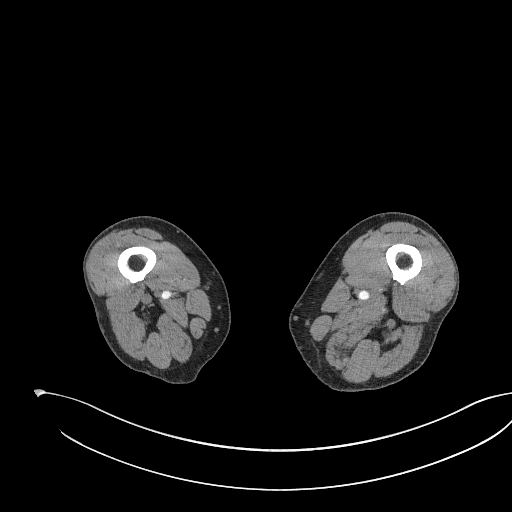
[im 275/470  soft-tissue]
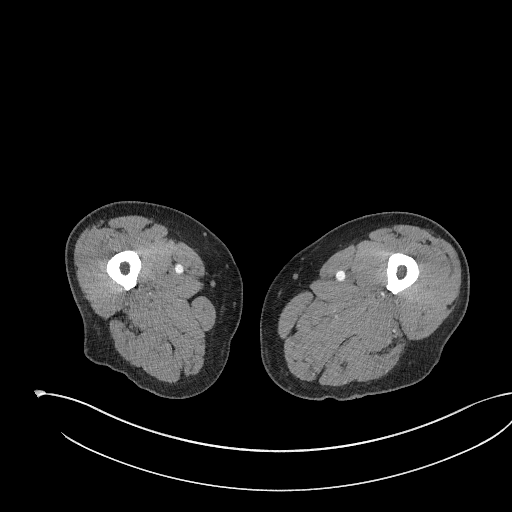
[im 324/470  soft-tissue]
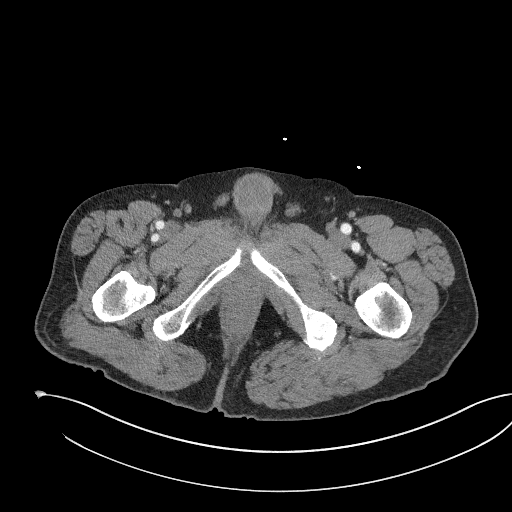
[im 356/470  soft-tissue]
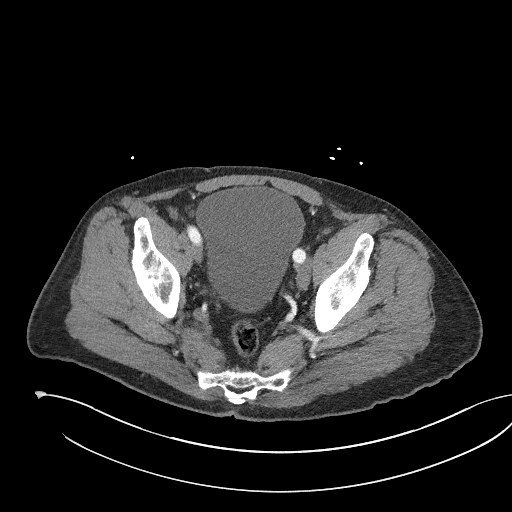
[im 356/470  bone]
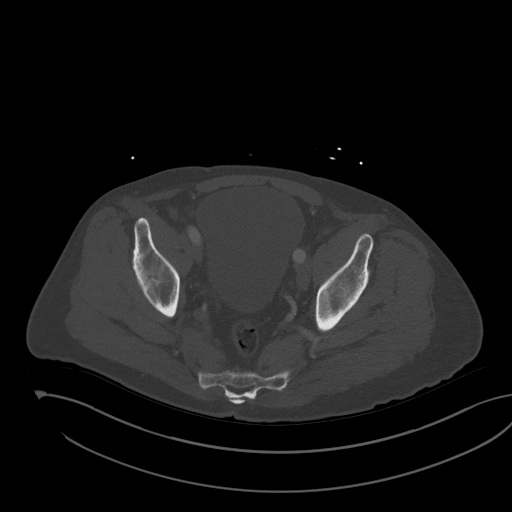
[im 405/470  soft-tissue]
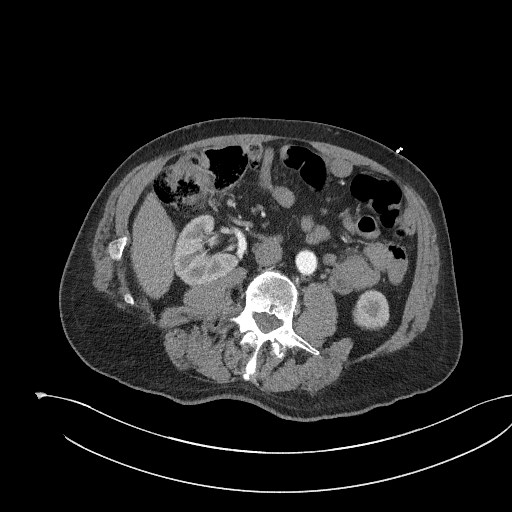
[im 405/470  lung]
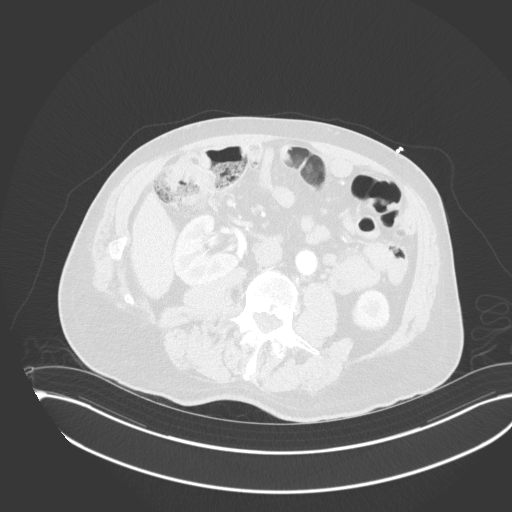
[im 421/470  lung]
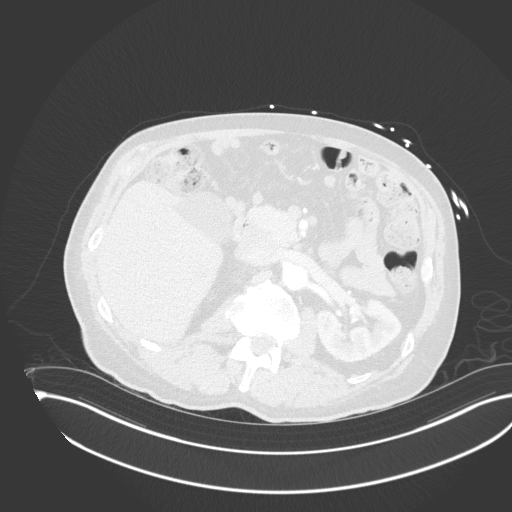
[im 437/470  soft-tissue]
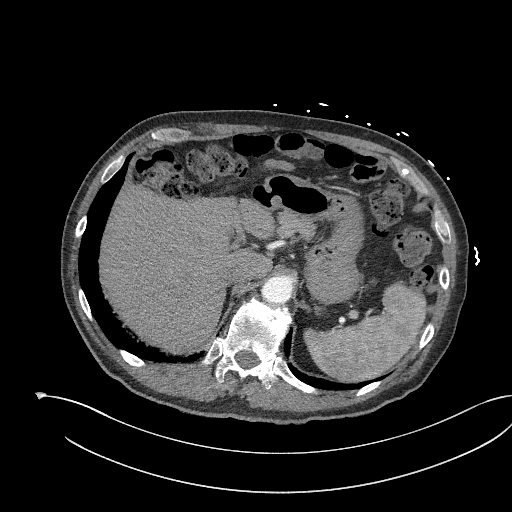
[im 437/470  lung]
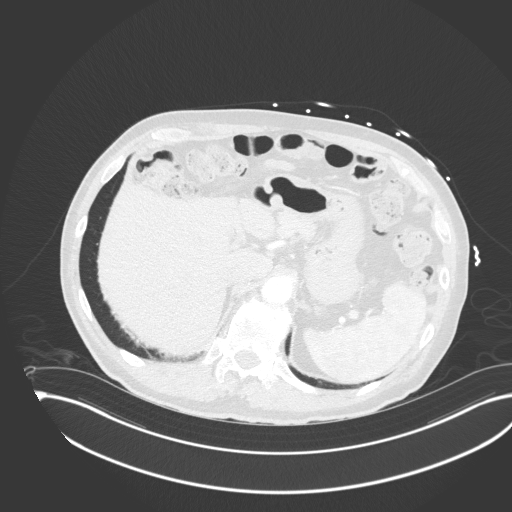
[im 453/470  lung]
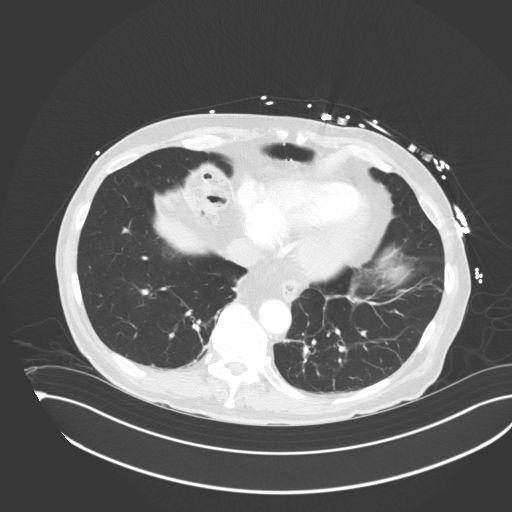

[13 of 46 positions shown; findings below may reference images not displayed]

RADIATION DOSE REDUCTION: This exam was performed according to the
departmental dose-optimization program which includes automated
exposure control, adjustment of the mA and/or kV according to
patient size and/or use of iterative reconstruction technique.

CONTRAST:  100mL OMNIPAQUE IOHEXOL 350 MG/ML SOLN
FINDINGS: VASCULAR

Aorta: Normal caliber aorta without aneurysm, dissection, vasculitis
or significant stenosis.

Celiac: Patent without evidence of aneurysm, dissection, vasculitis
or significant stenosis.

SMA: Patent without evidence of aneurysm, dissection, vasculitis or
significant stenosis.

Renals: Both renal arteries are patent without evidence of aneurysm,
dissection, vasculitis, fibromuscular dysplasia or significant
stenosis.

IMA: Patent without evidence of aneurysm, dissection, vasculitis or
significant stenosis.

RIGHT Lower Extremity

Inflow: Common, internal and external iliac arteries are patent
without evidence of aneurysm, dissection, vasculitis or significant
stenosis.

Outflow: Common, superficial and profunda femoral arteries and the
popliteal artery are patent without evidence of aneurysm,
dissection, vasculitis or significant stenosis.

Runoff: Patent three vessel runoff to the ankle.

LEFT Lower Extremity

Inflow: Common, internal and external iliac arteries are patent
without evidence of aneurysm, dissection, vasculitis or significant
stenosis.

Outflow: Common, superficial and profunda femoral arteries and the
popliteal artery are patent without evidence of aneurysm,
dissection, vasculitis or significant stenosis.

Runoff: There is 3 vessel runoff to the left foot. There is a
varicoid contrast filled structure within the left calf compatible
with a small arteriovenous fistula involving a muscular branch of
the gastrocnemius and the adjacent draining vein with aneurysmal
dilation of the draining venous limb to approximately 9 mm. No
active extravasation identified.

Veins: Unremarkable save for early venous drainage into the left
popliteal vein secondary to the arteriovenous fistula.

Review of the MIP images confirms the above findings.

NON-VASCULAR

Lower chest: No acute abnormality.

Hepatobiliary: Simple cyst within the right hepatic lobe. Liver
otherwise unremarkable. Gallbladder unremarkable. No intra or
extrahepatic biliary ductal dilation.

Pancreas: Unremarkable

Spleen: Unremarkable

Adrenals/Urinary Tract: The adrenal glands are unremarkable. The
kidneys are normal. Small congenital bladder diverticulum involving
the right posterolateral bladder adjacent to the right
ureterovesicular junction. The bladder is otherwise unremarkable.

Stomach/Bowel: Severe sigmoid and descending colonic diverticulosis
without superimposed acute inflammatory change. Moderate stool
throughout the colon without evidence of obstruction. Stomach, small
bowel, and large bowel are otherwise unremarkable. Appendix normal.

Lymphatic: No pathologic adenopathy within the abdomen and pelvis.

Reproductive: Mild prostatic enlargement.

Other: Tiny fat containing umbilical hernia.

Musculoskeletal: Osseous structures are age-appropriate. No acute
bone abnormality.
IMPRESSION: 1. Small arteriovenous fistula within the left calf involving a
muscular branch of the gastrocnemius and the adjacent draining vein
with aneurysmal dilation of the draining venous limb to
approximately 9 mm. No active extravasation identified.
2. Otherwise normal vascular examination. No large vessel stenosis
or segmental occlusion involving the right lower extremity arterial
vasculature.
3. No acute intra-abdominal pathology identified.
4. Severe distal colonic diverticulosis without superimposed acute
inflammatory change.
5. Mild prostatic enlargement.

Aortic Atherosclerosis ([DG]-[DG]).

## 2022-04-27 MED ORDER — ONDANSETRON HCL 4 MG/2ML IJ SOLN
4.0000 mg | Freq: Once | INTRAMUSCULAR | Status: AC
  Administered 2022-04-27: 4 mg via INTRAVENOUS
  Filled 2022-04-27: qty 2

## 2022-04-27 MED ORDER — MORPHINE SULFATE (PF) 4 MG/ML IV SOLN
4.0000 mg | Freq: Once | INTRAVENOUS | Status: AC
  Administered 2022-04-27: 4 mg via INTRAVENOUS
  Filled 2022-04-27: qty 1

## 2022-04-27 MED ORDER — IOHEXOL 350 MG/ML SOLN
100.0000 mL | Freq: Once | INTRAVENOUS | Status: AC | PRN
Start: 2022-04-27 — End: 2022-04-27
  Administered 2022-04-27: 100 mL via INTRAVENOUS

## 2022-04-27 MED ORDER — MORPHINE SULFATE (PF) 2 MG/ML IV SOLN: 2.0000 mg | Freq: Once | INTRAVENOUS | Status: DC

## 2022-04-27 MED ORDER — HYDROCODONE-ACETAMINOPHEN 5-325 MG PO TABS: 1.0000 | ORAL_TABLET | Freq: Four times a day (QID) | ORAL | 0 refills | Status: DC | PRN

## 2022-04-27 MED ORDER — SODIUM CHLORIDE 0.9 % IV BOLUS
500.0000 mL | Freq: Once | INTRAVENOUS | Status: AC
  Administered 2022-04-27: 500 mL via INTRAVENOUS

## 2022-04-27 NOTE — ED Provider Notes (Signed)
[facility] DEPART[facility] Provider Note   CSN: [id number] Arrival date & time: 04/27/22  1543     History  Chief Complaint  Patient presents with   Foot Pain    Raymond [name]. is a 71 y.o. male.  Patient as above with significant medical history as below, including GERD, HLD, HTN, raynaud's who presents to the ED with complaint of right foot pain.  Patient has history of Raynaud's phenomenon to his right foot.  He follows with Dr. Marin Olp and also with sports medicine specialist.  He was trialed on CCB but it did make the pain to his foot worse.  He also received ultrasound of bilateral legs which did show DVT on the left but no DVT on the right.  He is on warfarin with good compliance.  He has not followed up with a vascular specialist yet.  Patient reports his right foot pain has become progressively worse over the past week.  Not relieved with acetaminophen.  Pain does improve when he ambulates or walks from his house.  Pain is worsened when he is sedentary. No recent trauma. No knee or hip pain.      Past Medical History:  Diagnosis Date   GERD (gastroesophageal reflux disease)    Hyperlipidemia    Hypertension    Leg DVT (deep venous thromboembolism), chronic, left ([facility]) 12/12/2021    Past Surgical History:  Procedure Laterality Date   EYE SURGERY       The history is provided by the patient and a friend. No language interpreter was used.  Foot Pain Pertinent negatives include no chest pain, no abdominal pain, no headaches and no shortness of breath.       Home Medications Prior to Admission medications   Medication Sig Start Date End Date Taking? Authorizing Provider  HYDROcodone-acetaminophen (NORCO/VICODIN) 5-325 MG tablet Take 1 tablet by mouth every 6 (six) hours as needed. 04/27/22  Yes Wynona Dove A, DO  amLODipine (NORVASC) 2.5 MG tablet Take 1 tablet (2.5 mg total) by mouth daily. 04/19/22   Lyndal Pulley, DO  atorvastatin  (LIPITOR) 10 MG tablet Take 1 tablet (10 mg total) by mouth daily. 01/06/22   Marrian Salvage, FNP  cholecalciferol (VITAMIN D3) 25 MCG (1000 UNIT) tablet Take 1,000 Units by mouth daily.    [provider]  fluoruracil (FLUOROPLEX) 1 % cream 1 application to affected area    [provider]  gabapentin (NEURONTIN) 100 MG capsule Take 200 mg by mouth daily. 02/20/22   [provider]  hydrochlorothiazide (MICROZIDE) 12.5 MG capsule Take 1 capsule (12.5 mg total) by mouth daily. 07/14/21   Marrian Salvage, FNP  hydrocortisone cream 1 % Apply 1 application topically 2 (two) times daily.    [provider]  losartan (COZAAR) 50 MG tablet Take 1 tablet (50 mg total) by mouth daily. 07/14/21   Marrian Salvage, FNP  naproxen sodium (ALEVE) 220 MG tablet Take 220 mg by mouth as needed. Takes before playing tennis.    [provider]  Turmeric 500 MG CAPS 1 capsule    [provider]  warfarin (COUMADIN) 5 MG tablet Take 1 tablet (5 mg total) by mouth daily. 04/14/22   [name], MD      Allergies    Penicillins and Penicillin v potassium    Review of Systems   Review of Systems  Constitutional:  Negative for chills and fever.  HENT:  Negative for facial swelling  and trouble swallowing.   Eyes:  Negative for photophobia and visual disturbance.  Respiratory:  Negative for cough and shortness of breath.   Cardiovascular:  Negative for chest pain and palpitations.  Gastrointestinal:  Negative for abdominal pain, nausea and vomiting.  Endocrine: Negative for polydipsia and polyuria.  Genitourinary:  Negative for difficulty urinating and hematuria.  Musculoskeletal:  Positive for arthralgias. Negative for gait problem and joint swelling.  Skin:  Positive for color change. Negative for pallor and rash.  Neurological:  Negative for syncope and headaches.  Psychiatric/Behavioral:  Negative for agitation and confusion.      Physical Exam Updated Vital Signs BP 132/77   Pulse 75   Temp 98.5 F (36.9 C) (Oral)   Resp 14   Ht '5\' 11"'$  (1.803 m)   Wt 82.1 kg   SpO2 94%   BMI 25.24 kg/m  Physical Exam Vitals and nursing note reviewed.  Constitutional:      General: He is not in acute distress.    Appearance: Normal appearance. He is well-developed.  HENT:     Head: Normocephalic and atraumatic.     Right Ear: External ear normal.     Left Ear: External ear normal.     Mouth/Throat:     Mouth: Mucous membranes are moist.  Eyes:     General: No scleral icterus. Cardiovascular:     Rate and Rhythm: Normal rate and regular rhythm.     Pulses: Normal pulses.     Heart sounds: Normal heart sounds.  Pulmonary:     Effort: Pulmonary effort is normal. No respiratory distress.     Breath sounds: Normal breath sounds.  Abdominal:     General: Abdomen is flat.     Palpations: Abdomen is soft.     Tenderness: There is no abdominal tenderness. There is no guarding or rebound.     Comments: No pulsatile masses   Musculoskeletal:        General: Normal range of motion.     Cervical back: Normal range of motion.     Right lower leg: No edema.     Left lower leg: No edema.  Feet:     Comments: Pulses palpable to DP  Diminshed DP pulse on right Pallor/discolored toes/cyanosis - see photo right foot Nailbeds intact to right foot  No knee or ankle discomfort on palpation or ROM testing.  Skin:    General: Skin is warm and dry.     Capillary Refill: Capillary refill takes less than 2 seconds.  Neurological:     Mental Status: He is alert and oriented to person, place, and time.     GCS: GCS eye subscore is 4. GCS verbal subscore is 5. GCS motor subscore is 6.     Comments: Diminished sensation to right foot  Psychiatric:        Mood and Affect: Mood normal.        Behavior: Behavior normal.        ED Results / Procedures / Treatments   Labs (all labs ordered are listed, but only abnormal  results are displayed) Labs Reviewed  BASIC METABOLIC PANEL - Abnormal; Notable for the following components:      Result Value   BUN 24 (*)    All other components within normal limits  PROTIME-INR - Abnormal; Notable for the following components:   Prothrombin Time 20.6 (*)    INR 1.8 (*)    All other components within normal limits  CBC  CK    EKG None  Radiology CT Angio Aortobifemoral W and/or Wo Contrast  Result Date: 04/27/2022 CLINICAL DATA:  Claudication or leg ischemia RIGHT EXAM: CT ANGIOGRAPHY OF ABDOMINAL AORTA WITH ILIOFEMORAL RUNOFF TECHNIQUE: Multidetector CT imaging of the abdomen, pelvis and lower extremities was performed using the standard protocol during bolus administration of intravenous contrast. Multiplanar CT image reconstructions and MIPs were obtained to evaluate the vascular anatomy. RADIATION DOSE REDUCTION: This exam was performed according to the departmental dose-optimization program which includes automated exposure control, adjustment of the mA and/or kV according to patient size and/or use of iterative reconstruction technique. CONTRAST:  138m OMNIPAQUE IOHEXOL 350 MG/ML SOLN COMPARISON:  None Available. FINDINGS: VASCULAR Aorta: Normal caliber aorta without aneurysm, dissection, vasculitis or significant stenosis. Celiac: Patent without evidence of aneurysm, dissection, vasculitis or significant stenosis. SMA: Patent without evidence of aneurysm, dissection, vasculitis or significant stenosis. Renals: Both renal arteries are patent without evidence of aneurysm, dissection, vasculitis, fibromuscular dysplasia or significant stenosis. IMA: Patent without evidence of aneurysm, dissection, vasculitis or significant stenosis. RIGHT Lower Extremity Inflow: Common, internal and external iliac arteries are patent without evidence of aneurysm, dissection, vasculitis or significant stenosis. Outflow: Common, superficial and profunda femoral arteries and the popliteal  artery are patent without evidence of aneurysm, dissection, vasculitis or significant stenosis. Runoff: Patent three vessel runoff to the ankle. LEFT Lower Extremity Inflow: Common, internal and external iliac arteries are patent without evidence of aneurysm, dissection, vasculitis or significant stenosis. Outflow: Common, superficial and profunda femoral arteries and the popliteal artery are patent without evidence of aneurysm, dissection, vasculitis or significant stenosis. Runoff: There is 3 vessel runoff to the left foot. There is a varicoid contrast filled structure within the left calf compatible with a small arteriovenous fistula involving a muscular branch of the gastrocnemius and the adjacent draining vein with aneurysmal dilation of the draining venous limb to approximately 9 mm. No active extravasation identified. Veins: Unremarkable save for early venous drainage into the left popliteal vein secondary to the arteriovenous fistula. Review of the MIP images confirms the above findings. NON-VASCULAR Lower chest: No acute abnormality. Hepatobiliary: Simple cyst within the right hepatic lobe. Liver otherwise unremarkable. Gallbladder unremarkable. No intra or extrahepatic biliary ductal dilation. Pancreas: Unremarkable Spleen: Unremarkable Adrenals/Urinary Tract: The adrenal glands are unremarkable. The kidneys are normal. 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 The bladder is otherwise unremarkable. Stomach/Bowel: Severe sigmoid and descending colonic diverticulosis without superimposed acute inflammatory change. Moderate stool throughout the colon without evidence of obstruction. Stomach, small bowel, and large bowel are otherwise unremarkable. Appendix normal. Lymphatic: No pathologic adenopathy within the abdomen and pelvis. Reproductive: Mild prostatic enlargement. Other: Tiny fat containing umbilical hernia.  Musculoskeletal: Osseous structures are age-appropriate. No acute bone abnormality. IMPRESSION: 1. 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 No active extravasation identified. 2. Otherwise normal vascular examination. No large vessel stenosis or segmental occlusion involving the right lower extremity arterial vasculature. 3. No acute intra-abdominal pathology identified. 4. Severe distal colonic diverticulosis without superimposed acute inflammatory change. 5. Mild prostatic enlargement. Aortic Atherosclerosis (ICD10-I70.0). Electronically Signed   By: AFidela SalisburyM.D.   On: 04/27/2022 21:13    Procedures Procedures    Medications Ordered in ED Medications  sodium chloride 0.9 % bolus 500 mL (0 mLs Intravenous Stopped 04/27/22 2040)  ondansetron [facility]) injection 4 mg (4 mg Intravenous Given 04/27/22 1907)  morphine (PF) 4 MG/ML injection 4 mg (4 mg Intravenous Given 04/27/22 1907)  iohexol (OMNIPAQUE) 350 MG/ML injection 100 mL (100 mLs Intravenous Contrast Given 04/27/22 1951)    ED Course/ Medical Decision Making/ A&P                           Medical Decision Making Amount and/or Complexity of Data Reviewed Labs: ordered. Radiology: ordered.  Risk Prescription drug management.    CC: Foot pain right  This patient presents to the Emergency Department for the above complaint. This involves an extensive number of treatment options and is a complaint that carries with it a high risk of complications and morbidity. Vital signs were reviewed. Serious etiologies considered.  DDx includes but limited to Raynaud's phenomenon, vascular abnormality, claudication, embolism, blue toe syndrome, ischemia, soft tissue abnormality, infectious, other acute etiologies are considered  Palpable pulses to bilateral DP.  Feet are warm.  Toes  warm.    Record review:  Previous records obtained and reviewed  Prior PCP office notes, prior labs and imaging  Additional history obtained from  Friend at bedside  Medical and surgical history as noted above.   Work up as above, notable for:  Labs & imaging results that were available during my care of the patient were visualized by me and considered in my medical decision making.   I ordered imaging studies which included CT angio runoff. I visualized the imaging, interpreted images, and I agree with radiologist interpretation.  There is a small AV fistula left calf gastroc with aneurysmal dilation, no active extravasation.  No arterial occlusion.  Personally discussed patient care with consultant; Dr Virl Cagey vascular surgery.  Recommend outpatient follow-up for CT angio abnormality.  See images of patient's foot.  Appears similar to prior images 6/4.  Friend at bedside also has images from the week prior which do look similar.  Patient himself feels as though his feet look about their typical coloration.  Labs reviewed, INR is 1.8.  He is on warfarin for DVT.  Labs otherwise stable.  Management: Analgesics, IV fluids, antiemetic  Reassessment:  Patient is feeling better.  Pain improved.  He is able to ambulate.  Admission was considered.   Patient has follow-up with hematologist tomorrow.  We will have patient follow-up with vascular surgery as an outpatient.  The patient improved significantly and was discharged in stable condition. Detailed discussions were had with the patient regarding current findings, and need for close f/u with PCP or on call doctor. The patient has been instructed to return immediately if the symptoms worsen in any way for re-evaluation. Patient verbalized understanding and is in agreement with current care plan. All questions answered prior to discharge.              Social determinants of health include -  Social History   Socioeconomic  History   Marital status: Widowed    Spouse name: Not on file   Number of children: Not on file   Years of education: Not on file   Highest education level: Not on file  Occupational History   Not on file  Tobacco Use   Smoking status: Never   Smokeless tobacco: Not on file  Substance and Sexual Activity   Alcohol use: Not on file   Drug use: Not on file   Sexual activity: Yes  Other Topics Concern  Not on file  Social History Narrative   Not on file   Social Determinants of Health   Financial Resource Strain: Not on file  Food Insecurity: Not on file  Transportation Needs: Not on file  Physical Activity: Not on file  Stress: Not on file  Social Connections: Not on file  Intimate Partner Violence: Not on file      This chart was dictated using voice recognition software.  Despite best efforts to proofread,  errors can occur which can change the documentation meaning.         Final Clinical Impression(s) / ED Diagnoses Final diagnoses:  Left foot pain  Raynaud's disease without gangrene    Rx / DC Orders ED Discharge Orders          Ordered    HYDROcodone-acetaminophen (NORCO/VICODIN) 5-325 MG tablet  Every 6 hours PRN        04/27/22 2226              Jeanell Sparrow, DO 04/27/22 2229

## 2022-04-27 NOTE — Telephone Encounter (Signed)
Pt called to follow up on MyChart messaging from this week.   ABI scheduled for 6/23. EMG first avail not until August.  Pt advises his pain is getting worse day by day. Cannot walk, drive etc. None of our recommendations have provided any relief.  Pt unsure if he can wait til 6/23 for ABI but states he CANNOT wait til August for EMG. Needs help sooner.

## 2022-04-27 NOTE — ED Triage Notes (Signed)
Right foot pain x 2 weeks with weight bearing Seen at [facility] on 6/4 dx with pinched nerve  Gradually worsening, pain now when sleeping, states toes turning blue as well  Has [place] for right leg Friday with no sign of DVT Taken naproxen with no relief  H/o DVT, on coumadin

## 2022-04-27 NOTE — ED Notes (Signed)
Family at bedside. 

## 2022-04-27 NOTE — Discharge Instructions (Addendum)
It was a pleasure caring for you today in the emergency department. ° °Please return to the emergency department for any worsening or worrisome symptoms. ° ° °

## 2022-04-28 ENCOUNTER — Inpatient Hospital Stay (HOSPITAL_BASED_OUTPATIENT_CLINIC_OR_DEPARTMENT_OTHER): Payer: Medicare Other | Admitting: Hematology & Oncology

## 2022-04-28 ENCOUNTER — Emergency Department (HOSPITAL_BASED_OUTPATIENT_CLINIC_OR_DEPARTMENT_OTHER)
Admission: EM | Admit: 2022-04-28 | Discharge: 2022-04-28 | Disposition: A | Discharge status: Home / Self Care | Payer: Medicare Other | Attending: Emergency Medicine | Admitting: Emergency Medicine

## 2022-04-28 ENCOUNTER — Inpatient Hospital Stay: Payer: Medicare Other

## 2022-04-28 ENCOUNTER — Other Ambulatory Visit: Payer: Self-pay

## 2022-04-28 ENCOUNTER — Other Ambulatory Visit (HOSPITAL_BASED_OUTPATIENT_CLINIC_OR_DEPARTMENT_OTHER): Payer: Medicare Other

## 2022-04-28 ENCOUNTER — Encounter: Payer: Self-pay | Admitting: Hematology & Oncology

## 2022-04-28 ENCOUNTER — Ambulatory Visit (HOSPITAL_BASED_OUTPATIENT_CLINIC_OR_DEPARTMENT_OTHER): Payer: Medicare Other

## 2022-04-28 ENCOUNTER — Encounter (HOSPITAL_BASED_OUTPATIENT_CLINIC_OR_DEPARTMENT_OTHER): Payer: Self-pay

## 2022-04-28 VITALS — BP 134/75 | HR 82 | Temp 97.9°F | Resp 18 | Wt 179.0 lb

## 2022-04-28 DIAGNOSIS — I744 Embolism and thrombosis of arteries of extremities, unspecified: Secondary | ICD-10-CM | POA: Insufficient documentation

## 2022-04-28 DIAGNOSIS — I82502 Chronic embolism and thrombosis of unspecified deep veins of left lower extremity: Secondary | ICD-10-CM | POA: Diagnosis not present

## 2022-04-28 DIAGNOSIS — I743 Embolism and thrombosis of arteries of the lower extremities: Secondary | ICD-10-CM

## 2022-04-28 DIAGNOSIS — M79671 Pain in right foot: Secondary | ICD-10-CM | POA: Diagnosis present

## 2022-04-28 LAB — BASIC METABOLIC PANEL
Anion gap: 6 (ref 5–15)
BUN: 19 mg/dL (ref 8–23)
CO2: 29 mmol/L (ref 22–32)
Calcium: 9 mg/dL (ref 8.9–10.3)
Chloride: 102 mmol/L (ref 98–111)
Creatinine, Ser: 0.91 mg/dL (ref 0.61–1.24)
GFR, Estimated: >60 mL/min (ref >60)
Glucose, Bld: 95 mg/dL (ref 70–99)
Potassium: 3.9 mmol/L (ref 3.5–5.1)
Sodium: 137 mmol/L (ref 135–145)

## 2022-04-28 LAB — CBC WITH DIFFERENTIAL/PLATELET
Abs Immature Granulocytes: 0.03 10*3/uL (ref 0.00–0.07)
Basophils Absolute: 0.1 10*3/uL (ref 0.0–0.1)
Basophils Relative: 1 %
Eosinophils Absolute: 0.3 10*3/uL (ref 0.0–0.5)
Eosinophils Relative: 3 %
HCT: 45 % (ref 39.0–52.0)
Hemoglobin: 14.8 g/dL (ref 13.0–17.0)
Immature Granulocytes: 0 %
Lymphocytes Relative: 26 %
Lymphs Abs: 2.4 10*3/uL (ref 0.7–4.0)
MCH: 32 pg (ref 26.0–34.0)
MCHC: 32.9 g/dL (ref 30.0–36.0)
MCV: 97.2 fL (ref 80.0–100.0)
Monocytes Absolute: 0.9 10*3/uL (ref 0.1–1.0)
Monocytes Relative: 10 %
Neutro Abs: 5.4 10*3/uL (ref 1.7–7.7)
Neutrophils Relative %: 60 %
Platelets: 257 10*3/uL (ref 150–400)
RBC: 4.63 MIL/uL (ref 4.22–5.81)
RDW: 12.5 % (ref 11.5–15.5)
WBC: 9 10*3/uL (ref 4.0–10.5)
nRBC: 0 % (ref 0.0–0.2)

## 2022-04-28 LAB — PROTIME-INR
INR: 2 — ABNORMAL HIGH (ref 0.8–1.2)
Prothrombin Time: 22.6 seconds — ABNORMAL HIGH (ref 11.4–15.2)

## 2022-04-28 MED ORDER — NITROGLYCERIN 2 % TD OINT: 0.5000 [in_us] | TOPICAL_OINTMENT | Freq: Four times a day (QID) | TRANSDERMAL | 0 refills | Status: DC

## 2022-04-28 MED ORDER — OXYCODONE-ACETAMINOPHEN 5-325 MG PO TABS: 1.0000 | ORAL_TABLET | Freq: Four times a day (QID) | ORAL | 0 refills | Status: DC | PRN

## 2022-04-28 NOTE — ED Triage Notes (Signed)
Patient c/o right foot pain (in waves) x 2 weeks toes appear purple. 2nd toe has >3 second cap refill and is purple. Patient has lost sensation to his toes.  HX of DVT and is currently on Coumadin. Patient was seen here yesterday for same - sent by PMD.

## 2022-04-28 NOTE — Progress Notes (Signed)
Hematology and Oncology Follow Up Visit  Raymond Holt Sr. [id number] 24-Oct-1951 71 y.o. 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 -- start on 01/20/2022     Interim History:  Raymond [name] is back for follow-up.  Unfortunately, I think we have a significant issue now.  He appears to have significant his ischemia with respect to the first and second toes of his right foot.  You went to the emergency room yesterday because of pain in the right foot.  He had pictures taken.  He clearly has progression of this ischemia.  I am unsure as to why he would have this.  He did have a CT angiogram of the lower extremities last night.  This showed a small fistula in the left calf.  Otherwise, there is seem to be normal vascular examination.  There is no vessel stenosis.  There was no segmental occlusion.  He has been on warfarin for about 3 months.  As such, I do not think this would be warfarin induced skin necrosis.  He has had no chest pain.  He has had no nausea or vomiting.  There is been no change in bowel or bladder habits.  His INR yesterday was 1.8.  There is been no fever.  He said this all seem to start with him playing tennis.  Overall, I would say his performance status is probably ECOG 1.   Medications:  Current Outpatient Medications:    oxyCODONE-Acetaminophen (PERCOCET PO), Take by mouth every 6 (six) hours as needed., Disp: , Rfl:    atorvastatin (LIPITOR) 10 MG tablet, Take 1 tablet (10 mg total) by mouth daily., Disp: 90 tablet, Rfl: 3   cholecalciferol (VITAMIN D3) 25 MCG (1000 UNIT) tablet, Take 1,000 Units by mouth daily., Disp: , Rfl:    fluoruracil (FLUOROPLEX) 1 % cream, 1 application to affected area, Disp: , Rfl:    gabapentin (NEURONTIN) 100 MG capsule, Take 200 mg by mouth daily., Disp: , Rfl:    hydrochlorothiazide (MICROZIDE) 12.5  MG capsule, Take 1 capsule (12.5 mg total) by mouth daily., Disp: 90 capsule, Rfl: 1   HYDROcodone-acetaminophen (NORCO/VICODIN) 5-325 MG tablet, Take 1 tablet by mouth every 6 (six) hours as needed., Disp: 8 tablet, Rfl: 0   hydrocortisone cream 1 %, Apply 1 application topically 2 (two) times daily., Disp: , Rfl:    losartan (COZAAR) 50 MG tablet, Take 1 tablet (50 mg total) by mouth daily., Disp: 90 tablet, Rfl: 1   naproxen sodium (ALEVE) 220 MG tablet, Take 220 mg by mouth as needed. Takes before playing tennis., Disp: , Rfl:    Turmeric 500 MG CAPS, 1 capsule, Disp: , Rfl:    warfarin (COUMADIN) 5 MG tablet, Take 1 tablet (5 mg total) by mouth daily., Disp: 30 tablet, Rfl: 3  Allergies:  Allergies  Allergen Reactions   Penicillins Hives and Rash    Severe Rash    Penicillin V Potassium Other (See Comments)    Past Medical History, Surgical history, Social history, and Family History were reviewed and updated.  Review of Systems: Review of Systems  HENT:  Negative.    Eyes: Negative.   Respiratory: Negative.    Cardiovascular:  Positive for leg swelling.  Gastrointestinal: Negative.   Endocrine: Negative.   Genitourinary: Negative.    Musculoskeletal: Negative.   Skin: Negative.   Neurological:  Negative.   Hematological: Negative.   Psychiatric/Behavioral:  Positive for sleep disturbance.     Physical Exam:  weight is 179 lb (81.2 kg). His oral temperature is 97.9 F (36.6 C). His blood pressure is 134/75 and his pulse is 82. His respiration is 18 and oxygen saturation is 95%.   Wt Readings from Last 3 Encounters:  04/28/22 179 lb (81.2 kg)  04/27/22 181 lb (82.1 kg)  04/19/22 181 lb (82.1 kg)    Physical Exam Vitals reviewed.  HENT:     Head: Normocephalic and atraumatic.  Eyes:     Pupils: Pupils are equal, round, and reactive to light.  Cardiovascular:     Rate and Rhythm: Normal rate and regular rhythm.     Heart sounds: Normal heart sounds.  Pulmonary:      Effort: Pulmonary effort is normal.     Breath sounds: Normal breath sounds.  Abdominal:     General: Bowel sounds are normal.     Palpations: Abdomen is soft.  Musculoskeletal:        General: No tenderness or deformity. Normal range of motion.     Cervical back: Normal range of motion.     Comments: He has ischemia with the second and first toes of the right foot.  They are little bit cool.  The rest of his foot does seem to be of normal temperature.  He has tenderness when his second and first toe are palpated.  He has limited range of motion.  There may be a little bit of ischemia of the third toe.  The fourth and fifth toes of the right foot seem to be okay.  Lymphadenopathy:     Cervical: No cervical adenopathy.  Skin:    General: Skin is warm and dry.     Findings: No erythema or rash.  Neurological:     Mental Status: He is alert and oriented to person, place, and time.  Psychiatric:        Behavior: Behavior normal.        Thought Content: Thought content normal.        Judgment: Judgment normal.      Lab Results  Component Value Date   WBC 9.5 04/27/2022   HGB 15.1 04/27/2022   HCT 45.5 04/27/2022   MCV 97.4 04/27/2022   PLT 269 04/27/2022     Chemistry      Component Value Date/Time   NA 138 04/27/2022 1900   K 3.8 04/27/2022 1900   CL 102 04/27/2022 1900   CO2 27 04/27/2022 1900   BUN 24 (H) 04/27/2022 1900   CREATININE 0.89 04/27/2022 1900   CREATININE 1.09 03/03/2022 1447      Component Value Date/Time   CALCIUM 9.1 04/27/2022 1900   ALKPHOS 102 03/03/2022 1447   AST 25 03/03/2022 1447   ALT 17 03/03/2022 1447   BILITOT 1.0 03/03/2022 1447      Impression and Plan: Raymond [name] is a very nice 71 year old white male.  He has a thrombus in the left leg.  This was idiopathic from all of our studies.  Again he is very interesting.  He has a PhD in psychotherapy.  He loves classic rock of the 65s.  Again, I really think we have a problem here this  appears to be an arterial issue.  I think he is going to need to be on IV heparin right now.  Again, I think is probably going need to have an angiogram done of  his right leg.  I know that he had the CT angiogram.  I think he probably needs to have an arterial angiogram looking to see if there is any type of emboli in his toes.  I just worried that there may be a possibility of amputation.  I know that he will be seen by vascular surgery.  I we will get him down to the emergency room.  I spoke to the ER doctor.  I just hate this for Dr. Minette Brine.  He is always been very active.  He just is incredibly interesting to talk to.  We will likely see him when he is in the hospital.      [name], MD 6/16/20233:26 PM

## 2022-04-28 NOTE — Discharge Instructions (Signed)
Continue taking your Coumadin.  You also need to start taking a baby aspirin at 81 mg daily.  I have given you a prescription for pain medicine which is Percocet.  Make sure that you take a stool softener along with this and do not take any extra Tylenol with it.  You can use the nitroglycerin paste as well.  Return to the emergency room if you have any worsening symptoms.

## 2022-04-28 NOTE — ED Provider Notes (Signed)
[facility] EMERGENCY DEPARTMENT Provider Note   CSN: [id number] Arrival date & time: 04/28/22  1529     History  Chief Complaint  Patient presents with   Foot Pain   Circulatory Problem    Raymond Gelpi Sr. is a 71 y.o. male.  Patient is a 71 year old male who presents with increasing pain and discoloration in his right toes.  He said he started about 2 weeks ago with some pain in his upper thigh area.  And now he has pain in his right toes.  He has been seen by sports medicine doctor and it was felt to be potentially related to neuropathy or pain from his back.  He then started having some increased pain in his toes and some discoloration.  He had an ultrasound which was negative for DVT.  He was seen in the ED yesterday and had a CTA of his leg.  There was no arterial blockages noted.  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  The EDP discussed the findings with the vascular surgeon who recommended outpatient follow-up.  Patient was seen by Dr. Marin Olp today and felt like the discoloration was getting worse that he was progressing toward potential gangrene.  He came down to be evaluated in the emergency room.       Home Medications Prior to Admission medications   Medication Sig Start Date End Date Taking? Authorizing Provider  nitroGLYCERIN (NITROGLYN) 2 % ointment Apply 0.5 inches topically 4 (four) times daily. 04/28/22  Yes Malvin Johns, MD  oxyCODONE-acetaminophen (PERCOCET/ROXICET) 5-325 MG tablet Take 1 tablet by mouth every 6 (six) hours as needed for severe pain. 04/28/22  Yes Malvin Johns, MD  atorvastatin (LIPITOR) 10 MG tablet Take 1 tablet (10 mg total) by mouth daily. 01/06/22   Marrian Salvage, FNP  cholecalciferol (VITAMIN D3) 25 MCG (1000 UNIT) tablet Take 1,000 Units by mouth daily.    [provider]  fluoruracil (FLUOROPLEX) 1 % cream 1 application to affected area     [provider]  gabapentin (NEURONTIN) 100 MG capsule Take 200 mg by mouth daily. 02/20/22   [provider]  hydrochlorothiazide (MICROZIDE) 12.5 MG capsule Take 1 capsule (12.5 mg total) by mouth daily. 07/14/21   Marrian Salvage, FNP  HYDROcodone-acetaminophen (NORCO/VICODIN) 5-325 MG tablet Take 1 tablet by mouth every 6 (six) hours as needed. 04/27/22   Jeanell Sparrow, DO  hydrocortisone cream 1 % Apply 1 application topically 2 (two) times daily.    [provider]  losartan (COZAAR) 50 MG tablet Take 1 tablet (50 mg total) by mouth daily. 07/14/21   Marrian Salvage, FNP  naproxen sodium (ALEVE) 220 MG tablet Take 220 mg by mouth as needed. Takes before playing tennis.    [provider]  oxyCODONE-Acetaminophen (PERCOCET PO) Take by mouth every 6 (six) hours as needed.    [provider]  Turmeric 500 MG CAPS 1 capsule    [provider]  warfarin (COUMADIN) 5 MG tablet Take 1 tablet (5 mg total) by mouth daily. 04/14/22   [name], MD      Allergies    Penicillins and Penicillin v potassium    Review of Systems   Review of Systems  Constitutional:  Negative for chills, diaphoresis, fatigue and fever.  HENT:  Negative for congestion, rhinorrhea and sneezing.   Eyes: Negative.   Respiratory:  Negative for cough, chest tightness  and shortness of breath.   Cardiovascular:  Negative for chest pain and leg swelling.  Gastrointestinal:  Negative for abdominal pain, blood in stool, diarrhea, nausea and vomiting.  Genitourinary:  Negative for difficulty urinating, flank pain, frequency and hematuria.  Musculoskeletal:  Positive for arthralgias. Negative for back pain.  Skin:  Positive for color change. Negative for rash.  Neurological:  Negative for dizziness, speech difficulty, weakness, numbness and headaches.    Physical Exam Updated Vital Signs BP (!) 142/79   Pulse 68   Temp 98.3 F (36.8 C) (Oral)   Resp 18    Ht '5\' 11"'$  (1.803 m)   Wt 81.2 kg   SpO2 94%   BMI 24.97 kg/m  Physical Exam Constitutional:      Appearance: He is well-developed.  HENT:     Head: Normocephalic and atraumatic.  Eyes:     Pupils: Pupils are equal, round, and reactive to light.  Cardiovascular:     Rate and Rhythm: Normal rate and regular rhythm.     Heart sounds: Normal heart sounds.  Pulmonary:     Effort: Pulmonary effort is normal. No respiratory distress.     Breath sounds: Normal breath sounds. No wheezing or rales.  Chest:     Chest wall: No tenderness.  Abdominal:     General: Bowel sounds are normal.     Palpations: Abdomen is soft.     Tenderness: There is no abdominal tenderness. There is no guarding or rebound.  Musculoskeletal:        General: Normal range of motion.     Cervical back: Normal range of motion and neck supple.     Comments: Patient has a warm foot.  Pulses are palpable although the right DP is less prominent.  There is some discoloration of his toes that are purplish and white discoloration to the tip of the toes.  Lymphadenopathy:     Cervical: No cervical adenopathy.  Skin:    General: Skin is warm and dry.     Findings: No rash.  Neurological:     Mental Status: He is alert and oriented to person, place, and time.      ED Results / Procedures / Treatments   Labs (all labs ordered are listed, but only abnormal results are displayed) Labs Reviewed  PROTIME-INR - Abnormal; Notable for the following components:      Result Value   Prothrombin Time 22.6 (*)    INR 2.0 (*)    All other components within normal limits  BASIC METABOLIC PANEL  CBC WITH DIFFERENTIAL/PLATELET    EKG EKG Interpretation  Date/Time:  Friday April 28 2022 16:38:07 EDT Ventricular Rate:  65 PR Interval:  153 QRS Duration: 104 QT Interval:  426 QTC Calculation: 443 R Axis:   80 Text Interpretation: Sinus rhythm No old tracing to compare Confirmed by Malvin Johns [phone]) on 04/28/2022  5:46:59 PM  Radiology CT Angio Aortobifemoral W and/or Wo Contrast  Result Date: 04/27/2022 CLINICAL DATA:  Claudication or leg ischemia RIGHT EXAM: CT ANGIOGRAPHY OF ABDOMINAL AORTA WITH ILIOFEMORAL RUNOFF TECHNIQUE: Multidetector CT imaging of the abdomen, pelvis and lower extremities was performed using the standard protocol during bolus administration of intravenous contrast. Multiplanar CT image reconstructions and MIPs were obtained to evaluate the vascular anatomy. RADIATION DOSE REDUCTION: This exam was performed according to the departmental dose-optimization program which includes automated exposure control, adjustment of the mA and/or kV according to patient size and/or use of iterative reconstruction technique. CONTRAST:  113m OMNIPAQUE IOHEXOL 350 MG/ML SOLN COMPARISON:  None Available. FINDINGS: VASCULAR Aorta: Normal caliber aorta without aneurysm, dissection, vasculitis or significant stenosis. Celiac: Patent without evidence of aneurysm, dissection, vasculitis or significant stenosis. SMA: Patent without evidence of aneurysm, dissection, vasculitis or significant stenosis. Renals: Both renal arteries are patent without evidence of aneurysm, dissection, vasculitis, fibromuscular dysplasia or significant stenosis. IMA: Patent without evidence of aneurysm, dissection, vasculitis or significant stenosis. RIGHT Lower Extremity Inflow: Common, internal and external iliac arteries are patent without evidence of aneurysm, dissection, vasculitis or significant stenosis. Outflow: Common, superficial and profunda femoral arteries and the popliteal artery are patent without evidence of aneurysm, dissection, vasculitis or significant stenosis. Runoff: Patent three vessel runoff to the ankle. LEFT Lower Extremity Inflow: Common, internal and external iliac arteries are patent without evidence of aneurysm, dissection, vasculitis or significant stenosis. Outflow: Common, superficial and profunda femoral  arteries and the popliteal artery are patent without evidence of aneurysm, dissection, vasculitis or significant stenosis. Runoff: There is 3 vessel runoff to the left foot. There is a varicoid contrast filled structure within the left calf compatible with a small arteriovenous fistula involving a muscular branch of the gastrocnemius and the adjacent draining vein with aneurysmal dilation of the draining venous limb to approximately 9 mm. No active extravasation identified. Veins: Unremarkable save for early venous drainage into the left popliteal vein secondary to the arteriovenous fistula. Review of the MIP images confirms the above findings. NON-VASCULAR Lower chest: No acute abnormality. Hepatobiliary: Simple cyst within the right hepatic lobe. Liver otherwise unremarkable. Gallbladder unremarkable. No intra or extrahepatic biliary ductal dilation. Pancreas: Unremarkable Spleen: Unremarkable Adrenals/Urinary Tract: The adrenal glands are unremarkable. The kidneys are normal. 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 The bladder is otherwise unremarkable. Stomach/Bowel: Severe sigmoid and descending colonic diverticulosis without superimposed acute inflammatory change. Moderate stool throughout the colon without evidence of obstruction. Stomach, small bowel, and large bowel are otherwise unremarkable. Appendix normal. Lymphatic: No pathologic adenopathy within the abdomen and pelvis. Reproductive: Mild prostatic enlargement. Other: Tiny fat containing umbilical hernia. Musculoskeletal: Osseous structures are age-appropriate. No acute bone abnormality. IMPRESSION: 1. 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 No active extravasation identified. 2. Otherwise normal  vascular examination. No large vessel stenosis or segmental occlusion involving the right lower extremity arterial vasculature. 3. No acute intra-abdominal pathology identified. 4. Severe distal colonic diverticulosis without superimposed acute inflammatory change. 5. Mild prostatic enlargement. Aortic Atherosclerosis (ICD10-I70.0). Electronically Signed   By: AFidela SalisburyM.D.   On: 04/27/2022 21:13    Procedures Procedures    Medications Ordered in ED Medications - No data to display  ED Course/ Medical Decision Making/ A&P                           Medical Decision Making Amount and/or Complexity of Data Reviewed Labs: ordered.  Risk Prescription drug management.   Patient is a 71year old who has some concerning findings for possible embolic events to his toes.  There is some concern for early changes of gangrene.  I spoke with Dr. HRoselie Awkwardwith vascular surgery.  He reviewed the images and the photographs of the foot.  He agrees that it is likely an embolic event.  He said that given that he has no arterial blockage,  unfortunately there is not much to do about the toes.  He would recommend pain control and some Nitropaste.  He also recommended adding baby aspirin for antiplatelet effect to his Coumadin.  He will arrange for patient have close follow-up in the office on Monday or Tuesday.  He will order outpatient studies including an echo and possibly a CT of his chest to do further outpatient work-up but at this point he does not feel that there would be any benefit to hospitalization.  I discussed these findings with the patient and he is amenable to the plan.  Final Clinical Impression(s) / ED Diagnoses Final diagnoses:  Embolic disease of toe ([place])    Rx / DC Orders ED Discharge Orders          Ordered    oxyCODONE-acetaminophen (PERCOCET/ROXICET) 5-325 MG tablet  Every 6 hours PRN        04/28/22 1749    nitroGLYCERIN (NITROGLYN) 2 % ointment  4 times daily         04/28/22 1813              Malvin Johns, MD 04/28/22 1820

## 2022-04-28 NOTE — ED Notes (Signed)
Pt d/c home per MD order, Reports discharge ride home is a friend. Discharge summary reviewed, pt verbalizes understanding. Off unit via WC- No s/s of acute distress noted at discharge

## 2022-04-29 ENCOUNTER — Other Ambulatory Visit (HOSPITAL_COMMUNITY): Payer: Self-pay

## 2022-05-01 ENCOUNTER — Ambulatory Visit (INDEPENDENT_AMBULATORY_CARE_PROVIDER_SITE_OTHER): Payer: Medicare Other | Admitting: Family

## 2022-05-01 ENCOUNTER — Telehealth: Payer: Self-pay | Admitting: Family Medicine

## 2022-05-01 ENCOUNTER — Encounter: Payer: Self-pay | Admitting: Hematology & Oncology

## 2022-05-01 VITALS — BP 131/67 | HR 82 | Temp 98.8°F | Wt 183.0 lb

## 2022-05-01 DIAGNOSIS — I743 Embolism and thrombosis of arteries of the lower extremities: Secondary | ICD-10-CM

## 2022-05-01 DIAGNOSIS — M79604 Pain in right leg: Secondary | ICD-10-CM

## 2022-05-01 DIAGNOSIS — I998 Other disorder of circulatory system: Secondary | ICD-10-CM | POA: Diagnosis not present

## 2022-05-01 NOTE — Progress Notes (Signed)
Subjective:   By signing my name below, I, Shehryar Baig, attest that this documentation has been prepared under the direction and in the presence of  Debbrah Alar, NP 05/01/2022    Patient ID: Raymond Like Sr., male    DOB: 07/17/1951, 71 y.o.   MRN: [id number]  Chief Complaint  Patient presents with   Follow-up    ED follow up   Foot Pain    Complains of foot pain that radiates up his right foot, trouble sleeping due to the pain    Foot Pain   Patient is in today for a ED follow up visit.   He was admitted to the ED on 04/27/2022 for right foot pain. At that appointment he underwent CT angio Aortobifemoral W and Wo contrast which noted no large vessel stenosis or segmental occlusion involving the right lower extremity arterial vasculature. He was discharged home with plan for follow up with the Vascular surgeon as an outpatient. He was continued on his coumadin.   He had an office visit on 04/28/22 with his hematologist who sent him back to the ED on 6/16 due to concern about worsening ischemia to the right toes. ED physician consulted Dr. Roselie Awkward with Vascular surgery who reviewed his case. He recommended nitropaste topically, adding aspirin for antiplatelet effect and close outpatient follow up. He has an appointment on 05/02/21 with vascular.    He has a history of raynaud's disease on his right foot. He also has hx of L DVT for which he is taking warfarin regularly. His INR levels on Friday was 2.0.   His foot pain has not improved. He is taking percocet 4x daily to manage his pain and find it helps some but his pain persists when his medication wears off. He cannot sleep due to his pain worsening. When sleeping his blood flow is reduced on his toes and his pain worsens. He reports not sleeping at all for 2 nights since his hospital visit. He reports last night he got up multiple times at night. He is applying 2% nitroglycerin ointment to his toes since his ER visit.   He recently started taking aspirin regularly. During his last visit with his PCP he was discussing seeing a mental health specialist and he is waiting to sort out his finances before he makes an appointment. His wife recently passed away so he is busy trying to get her affairs in order.    Health Maintenance Due  Topic Date Due   Hepatitis C Screening  Never done   Zoster Vaccines- Shingrix (1 of 2) Never done   COVID-19 Vaccine (5 - Pfizer series) 04/25/2021    Past Medical History:  Diagnosis Date   GERD (gastroesophageal reflux disease)    Hyperlipidemia    Hypertension    Leg DVT (deep venous thromboembolism), chronic, left ([place]) 12/12/2021    Past Surgical History:  Procedure Laterality Date   EYE SURGERY      Family History  Problem Relation Age of Onset   Alcohol abuse Mother    Arthritis Mother    Diabetes Mother    Mental retardation Mother    Alcohol abuse Father    Hypertension Father    Cancer Sister    Depression Sister    Cancer Sister    Arthritis Maternal Grandmother    Parkinson's disease Maternal Grandfather     Social History   Socioeconomic History   Marital status: Widowed    Spouse name: Not on file  Number of children: Not on file   Years of education: Not on file   Highest education level: Not on file  Occupational History   Not on file  Tobacco Use   Smoking status: Never   Smokeless tobacco: Not on file  Substance and Sexual Activity   Alcohol use: Not on file   Drug use: Not on file   Sexual activity: Yes  Other Topics Concern   Not on file  Social History Narrative   Not on file   Social Determinants of Health   Financial Resource Strain: Not on file  Food Insecurity: Not on file  Transportation Needs: Not on file  Physical Activity: Not on file  Stress: Not on file  Social Connections: Not on file  Intimate Partner Violence: Not on file    Outpatient Medications Prior to Visit  Medication Sig Dispense Refill    atorvastatin (LIPITOR) 10 MG tablet Take 1 tablet (10 mg total) by mouth daily. 90 tablet 3   cholecalciferol (VITAMIN D3) 25 MCG (1000 UNIT) tablet Take 1,000 Units by mouth daily.     fluoruracil (FLUOROPLEX) 1 % cream 1 application to affected area     gabapentin (NEURONTIN) 100 MG capsule Take 200 mg by mouth daily.     hydrochlorothiazide (MICROZIDE) 12.5 MG capsule Take 1 capsule (12.5 mg total) by mouth daily. 90 capsule 1   HYDROcodone-acetaminophen (NORCO/VICODIN) 5-325 MG tablet Take 1 tablet by mouth every 6 (six) hours as needed. 8 tablet 0   hydrocortisone cream 1 % Apply 1 application topically 2 (two) times daily.     losartan (COZAAR) 50 MG tablet Take 1 tablet (50 mg total) by mouth daily. 90 tablet 1   naproxen sodium (ALEVE) 220 MG tablet Take 220 mg by mouth as needed. Takes before playing tennis.     nitroGLYCERIN (NITROGLYN) 2 % ointment Apply 0.5 inches topically 4 (four) times daily. 30 g 0   oxyCODONE-Acetaminophen (PERCOCET PO) Take by mouth every 6 (six) hours as needed.     oxyCODONE-acetaminophen (PERCOCET/ROXICET) 5-325 MG tablet Take 1 tablet by mouth every 6 (six) hours as needed for severe pain. 15 tablet 0   Turmeric 500 MG CAPS 1 capsule     warfarin (COUMADIN) 5 MG tablet Take 1 tablet (5 mg total) by mouth daily. 30 tablet 3   No facility-administered medications prior to visit.    Allergies  Allergen Reactions   Penicillins Hives and Rash    Severe Rash    Penicillin V Potassium Other (See Comments)    Review of Systems  Musculoskeletal:        (+)right toes pain  Psychiatric/Behavioral:  The patient has insomnia (due to pain).        Objective:    Physical Exam Constitutional:      General: He is not in acute distress.    Appearance: Normal appearance. He is not ill-appearing.  HENT:     Head: Normocephalic and atraumatic.     Right Ear: External ear normal.     Left Ear: External ear normal.  Eyes:     Extraocular Movements:  Extraocular movements intact.     Pupils: Pupils are equal, round, and reactive to light.  Cardiovascular:     Rate and Rhythm: Normal rate and regular rhythm.     Pulses:          Dorsalis pedis pulses are 2+ on the left side.       Posterior tibial pulses are 1+  on the left side.     Heart sounds: Normal heart sounds. No murmur heard.    No gallop.  Pulmonary:     Effort: Pulmonary effort is normal. No respiratory distress.     Breath sounds: Normal breath sounds. No wheezing or rales.  Skin:    General: Skin is warm and dry.  Neurological:     Mental Status: He is alert and oriented to person, place, and time.  Psychiatric:        Judgment: Judgment normal.     BP 131/67 (BP Location: Right Arm, Patient Position: Sitting, Cuff Size: Small)   Pulse 82   Temp 98.8 F (37.1 C) (Oral)   Wt 183 lb (83 kg)   SpO2 96%   BMI 25.52 kg/m  Wt Readings from Last 3 Encounters:  05/01/22 183 lb (83 kg)  04/28/22 179 lb (81.2 kg)  04/28/22 179 lb (81.2 kg)       Assessment & Plan:   Problem List Items Addressed This Visit       Unprioritized   Pain of right lower extremity due to ischemia - Primary    Felt to be thromboembolic. He is continuing his coumadin and aspirin.  He plans to keep the appointment tomorrow with vascular.  He is managing the pain with percocet. He was taking naproxen, but I advised pt to discontinue due to coumadin.       32 minutes spent on today's visit. Time was spent reviewing ED records and counseling patient about his condition and care.  No orders of the defined types were placed in this encounter.   I, Nance Pear, NP, personally preformed the services described in this documentation.  All medical record entries made by the scribe were at my direction and in my presence.  I have reviewed the chart and discharge instructions (if applicable) and agree that the record reflects my personal performance and is accurate and complete.  '@ENCDATE'$ @     Nance Pear, NP

## 2022-05-01 NOTE — Assessment & Plan Note (Signed)
Felt to be thromboembolic. He is continuing his coumadin and aspirin.  He plans to keep the appointment tomorrow with vascular.  He is managing the pain with percocet. He was taking naproxen, but I advised pt to discontinue due to coumadin.

## 2022-05-01 NOTE — Progress Notes (Unsigned)
[facility] [place]  ASSESSMENT / PLAN: 71 y.o. male with atheroembolism to right lower extremity. Clinical exam concerning for tissues loss to right second > first toe. CT angiogram of abdomen / pelvis with runoff shows possible source in terminal aorta with small focus of shaggy aorta. Will not pursue echo or CT chest unless new atheroemboli occur. Counseled about the benign nature of this diagnosis. Continue ASA / Plavix. Nitro paste to toes. Follow up with me in 6 weeks to monitor progress.   CHIEF COMPLAINT: right toe pain  HISTORY OF PRESENT ILLNESS: Raymond Tangen Sr. is a 71 y.o. male referred to clinic for evaluation of blue toes about the right foot. This occurred suddenly last week without provoking event. 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  He was seen in the [facility] emergency department for evaluation.  A CT angiogram was performed which was grossly normal.  He was referred for outpatient evaluation in my clinic.  Patient is having severe pain in the right foot.  This is limiting ambulation.  He is not able to sleep well.  VASCULAR SURGICAL HISTORY: none  VASCULAR RISK FACTORS: Negative history of stroke / transient ischemic attack. Negative history of coronary artery disease.  Negative history of diabetes mellitus.  Negative history of smoking.  Positive history of hypertension. Negative history of chronic kidney disease.   Negative history of chronic obstructive pulmonary disease.  FUNCTIONAL STATUS: ECOG performance status: (2) Ambulatory and capable of self care, unable to carry out work activity, up and about > 50% or waking hours Ambulatory status: Minimally ambulatory (e.g. about the home only)  Past Medical History:  Diagnosis Date   DVT (deep venous thrombosis) (HCC)    GERD (gastroesophageal reflux disease)    Hyperlipidemia    Hypertension    Leg DVT (deep venous thromboembolism), chronic, left  ([place]) 12/12/2021    Past Surgical History:  Procedure Laterality Date   EYE SURGERY      Family History  Problem Relation Age of Onset   Alcohol abuse Mother    Arthritis Mother    Diabetes Mother    Mental retardation Mother    Alcohol abuse Father    Hypertension Father    Cancer Sister    Depression Sister    Cancer Sister    Arthritis Maternal Grandmother    Parkinson's disease Maternal Grandfather     Social History   Socioeconomic History   Marital status: Widowed    Spouse name: Not on file   Number of children: Not on file   Years of education: Not on file   Highest education level: Not on file  Occupational History   Not on file  Tobacco Use   Smoking status: Never   Smokeless tobacco: Not on file  Substance and Sexual Activity   Alcohol use: Not on file   Drug use: Not on file   Sexual activity: Yes  Other Topics Concern   Not on file  Social History Narrative   Not on file   Social Determinants of Health   Financial Resource Strain: Not on file  Food Insecurity: Not on file  Transportation Needs: Not on file  Physical Activity: Not on file  Stress: Not on file  Social Connections: Not on file  Intimate Partner Violence: Not on file    Allergies  Allergen Reactions   Penicillins Hives and Rash    Severe Rash    Penicillin V Potassium  Other (See Comments)    Current Outpatient Medications  Medication Sig Dispense Refill   atorvastatin (LIPITOR) 10 MG tablet Take 1 tablet (10 mg total) by mouth daily. 90 tablet 3   cholecalciferol (VITAMIN D3) 25 MCG (1000 UNIT) tablet Take 1,000 Units by mouth daily.     fluoruracil (FLUOROPLEX) 1 % cream 1 application to affected area     gabapentin (NEURONTIN) 100 MG capsule Take 200 mg by mouth daily.     hydrochlorothiazide (MICROZIDE) 12.5 MG capsule Take 1 capsule (12.5 mg total) by mouth daily. 90 capsule 1   HYDROcodone-acetaminophen (NORCO/VICODIN) 5-325 MG tablet Take 1 tablet by mouth every 6  (six) hours as needed. 8 tablet 0   hydrocortisone cream 1 % Apply 1 application topically 2 (two) times daily.     losartan (COZAAR) 50 MG tablet Take 1 tablet (50 mg total) by mouth daily. 90 tablet 1   naproxen sodium (ALEVE) 220 MG tablet Take 220 mg by mouth as needed. Takes before playing tennis.     nitroGLYCERIN (NITROGLYN) 2 % ointment Apply 0.5 inches topically 4 (four) times daily. 30 g 0   oxyCODONE-acetaminophen (PERCOCET) 7.5-325 MG tablet Take 1 tablet by mouth every 4 (four) hours as needed for severe pain. 45 tablet 0   Turmeric 500 MG CAPS 1 capsule     warfarin (COUMADIN) 5 MG tablet Take 1 tablet (5 mg total) by mouth daily. 30 tablet 3   No current facility-administered medications for this visit.    PHYSICAL EXAM Vitals:   05/02/22 1010  BP: (!) 149/82  Pulse: 80  Resp: 20  Temp: 99.5 F (37.5 C)  SpO2: 94%  Weight: 183 lb (83 kg)  Height: '5\' 11"'$  (1.803 m)    Constitutional: Well-appearing elderly man in no acute distress. Cardiac: regular rate and rhythm.  Respiratory:  unlabored. Peripheral vascular: 2+ pedal pulses bilaterally. Extremity: 2+ edema at right foot and ankle. + cyanosis first and second digit on the right. Skin: ischemic skin changes and sloughing about right second toe > first toe Lymphatic: no Stemmer's sign. no palpable lymphadenopathy.    PERTINENT LABORATORY AND RADIOLOGIC DATA  Most recent CBC    Latest Ref Rng & Units 04/28/2022    4:27 PM 04/27/2022    7:00 PM 03/03/2022    2:47 PM  CBC  WBC 4.0 - 10.5 K/uL 9.0  9.5  9.1   Hemoglobin 13.0 - 17.0 g/dL 14.8  15.1  15.2   Hematocrit 39.0 - 52.0 % 45.0  45.5  45.3   Platelets 150 - 400 K/uL 257  269  215      Most recent CMP    Latest Ref Rng & Units 04/28/2022    4:27 PM 04/27/2022    7:00 PM 03/03/2022    2:47 PM  CMP  Glucose 70 - 99 mg/dL 95  88  91   BUN 8 - 23 mg/dL '19  24  25   '$ Creatinine 0.61 - 1.24 mg/dL 0.91  0.89  1.09   Sodium 135 - 145 mmol/L 137  138  142    Potassium 3.5 - 5.1 mmol/L 3.9  3.8  4.6   Chloride 98 - 111 mmol/L 102  102  105   CO2 22 - 32 mmol/L '29  27  30   '$ Calcium 8.9 - 10.3 mg/dL 9.0  9.1  9.5   Total Protein 6.5 - 8.1 g/dL   7.1   Total Bilirubin 0.3 - 1.2 mg/dL  1.0   Alkaline Phos 38 - 126 U/L   102   AST 15 - 41 U/L   25   ALT 0 - 44 U/L   17     Renal function Estimated Creatinine Clearance: 77 mL/min (by C-G formula based on SCr of 0.91 mg/dL).  No results found for: "HGBA1C"  LDL Cholesterol  Date Value Ref Range Status  08/18/2021 72 0 - 99 mg/dL Final     CT angiogram of aorta shows small focus of soft atherosclerotic plaque at terminal aorta. Possible source for atheroembolism.  Yevonne Aline. Stanford Breed, MD [facility] Phone Number: [phone] 05/02/2022 12:45 PM  Total time spent on preparing this encounter including chart review, data review, collecting history, examining the patient, coordinating care for this new patient, 60 minutes.  Portions of this report may have been transcribed using voice recognition software.  Every effort has been made to ensure accuracy; however, inadvertent computerized transcription errors may still be present.

## 2022-05-01 NOTE — Telephone Encounter (Signed)
Pt wanted [place] to know that he will be seeing Dr. Stanford Breed at [facility] tomorrow. He has apparently had a couple of ED visits since he was last here.

## 2022-05-01 NOTE — Telephone Encounter (Signed)
I am glad he went to the ED to expedite thew treatment and is in good hands, we are here for questions

## 2022-05-02 ENCOUNTER — Ambulatory Visit: Payer: Medicare Other | Admitting: Physical Therapy

## 2022-05-02 ENCOUNTER — Ambulatory Visit (INDEPENDENT_AMBULATORY_CARE_PROVIDER_SITE_OTHER): Payer: Medicare Other | Admitting: Vascular Surgery

## 2022-05-02 ENCOUNTER — Encounter: Payer: Self-pay | Admitting: Vascular Surgery

## 2022-05-02 VITALS — BP 149/82 | HR 80 | Temp 99.5°F | Resp 20 | Ht 71.0 in | Wt 183.0 lb

## 2022-05-02 DIAGNOSIS — I75021 Atheroembolism of right lower extremity: Secondary | ICD-10-CM | POA: Diagnosis not present

## 2022-05-02 MED ORDER — OXYCODONE-ACETAMINOPHEN 7.5-325 MG PO TABS: 1.0000 | ORAL_TABLET | ORAL | 0 refills | Status: DC | PRN

## 2022-05-03 ENCOUNTER — Telehealth: Payer: Self-pay | Admitting: Family

## 2022-05-03 ENCOUNTER — Telehealth: Payer: Self-pay

## 2022-05-03 NOTE — Telephone Encounter (Signed)
Patient previously dropped off a note at the office stating his insurance would not cover DOS 08/20/21 because it was filed as preventative.  Pt has Traditional Medicare as his primary and [employer] Plan G as supplemental.  He was asking in the note if the visit could be converted into an AWV since he had not had one in 2022.  I have emailed our coder to take a look at this to see if anything can be done with the coding so patient is not charged the total bill for this DOS.

## 2022-05-04 ENCOUNTER — Ambulatory Visit (INDEPENDENT_AMBULATORY_CARE_PROVIDER_SITE_OTHER): Payer: Medicare Other | Admitting: Podiatry

## 2022-05-04 ENCOUNTER — Ambulatory Visit (INDEPENDENT_AMBULATORY_CARE_PROVIDER_SITE_OTHER): Payer: Medicare Other

## 2022-05-04 ENCOUNTER — Encounter: Payer: Self-pay | Admitting: Podiatry

## 2022-05-04 DIAGNOSIS — S99921A Unspecified injury of right foot, initial encounter: Secondary | ICD-10-CM | POA: Diagnosis not present

## 2022-05-04 DIAGNOSIS — I75021 Atheroembolism of right lower extremity: Secondary | ICD-10-CM | POA: Diagnosis not present

## 2022-05-04 DIAGNOSIS — I96 Gangrene, not elsewhere classified: Secondary | ICD-10-CM

## 2022-05-04 DIAGNOSIS — M7989 Other specified soft tissue disorders: Secondary | ICD-10-CM | POA: Diagnosis not present

## 2022-05-04 IMAGING — DX DG FOOT COMPLETE 3+V*R*
3 series · 3 of 3 positions shown · non-contrast
Comparison: None Available.

CLINICAL DATA: Toe injury, pain and swelling in right foot for 9
days. Pain in distal metatarsal area.

EXAM:
RIGHT FOOT COMPLETE - 3+ VIEW

[foot ap wb]
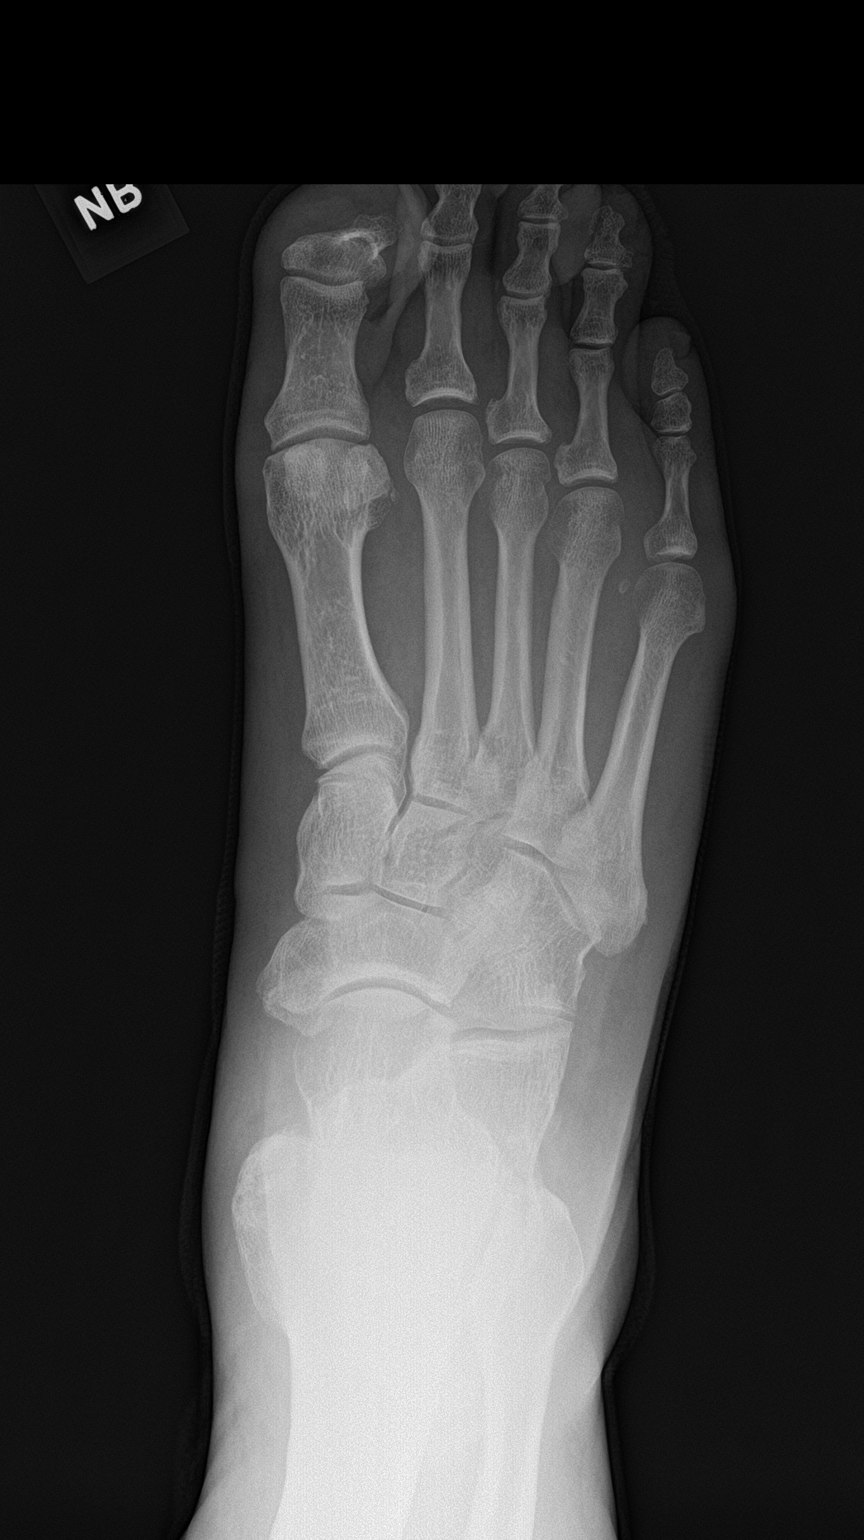

[foot obl wb]
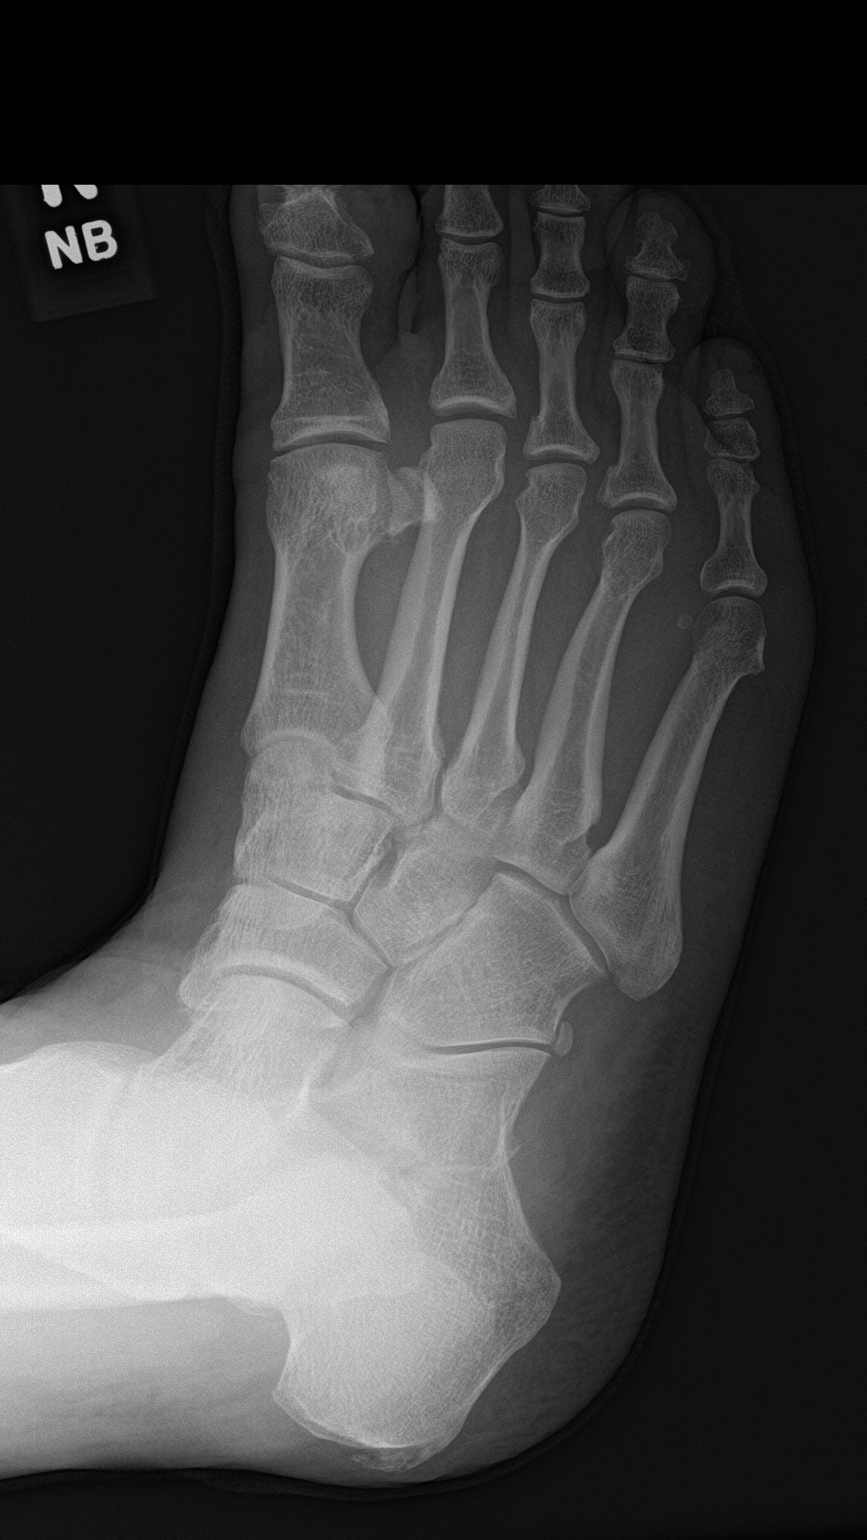

[foot lat wb]
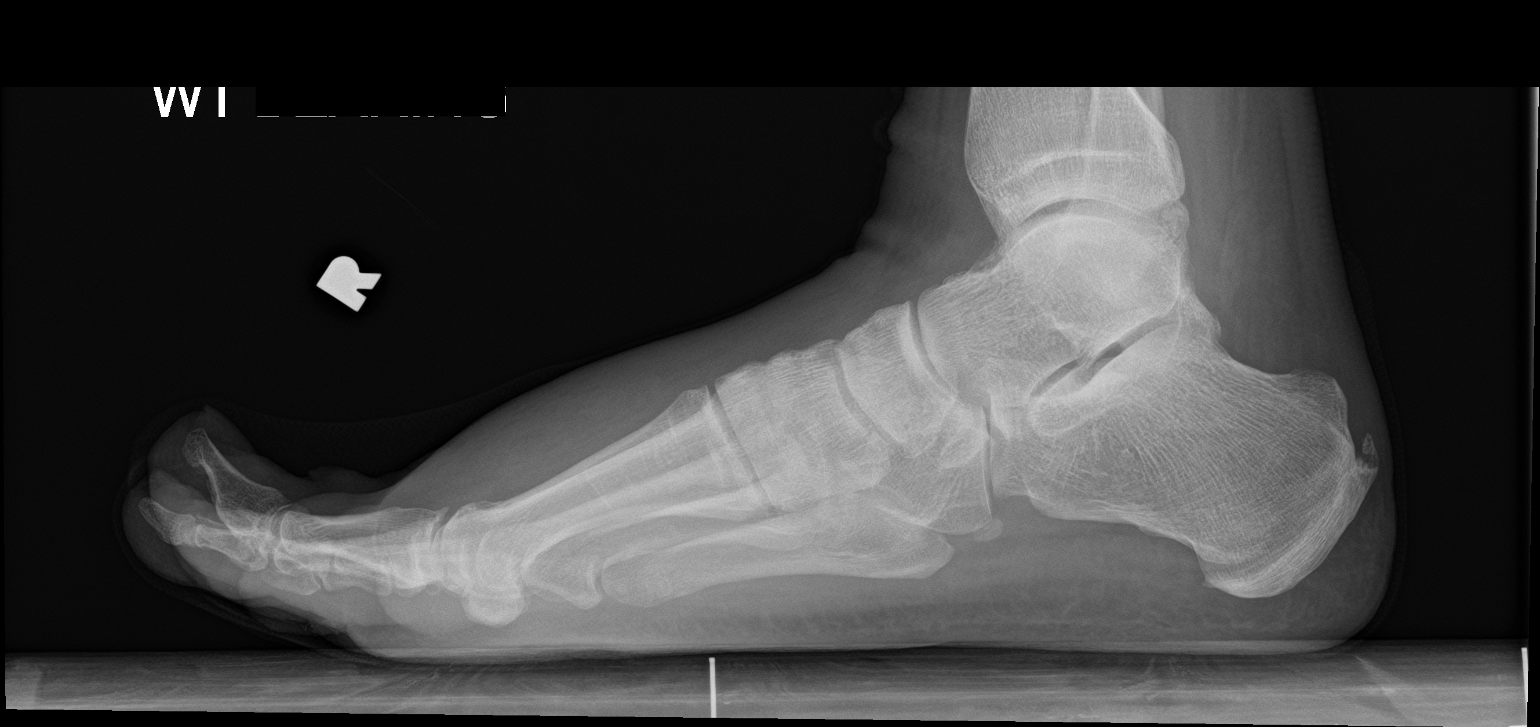

[3 of 3 positions shown; findings below may reference images not displayed]

FINDINGS: There is no evidence of fracture or dislocation. Mild degenerative
changes are present at the first metatarsophalangeal joint.
Enthesopathic changes are present at the insertion site of the
Achilles tendon. Minimal calcaneal spurring. Soft tissue swelling
over the dorsum of the forefoot.
IMPRESSION: No acute fracture or dislocation.

## 2022-05-04 MED ORDER — CLINDAMYCIN HCL 300 MG PO CAPS: 300.0000 mg | ORAL_CAPSULE | Freq: Three times a day (TID) | ORAL | 0 refills | Status: AC

## 2022-05-04 NOTE — Telephone Encounter (Signed)
Patient called and was informed of this message

## 2022-05-04 NOTE — Telephone Encounter (Signed)
error 

## 2022-05-04 NOTE — Telephone Encounter (Signed)
Raymond [name], in coding has updated codes and sent claim to charge correction for re-filing.  I sent patient a Mychart message to update him of the status and asked him to please allow his insurance a few week to reprocess the claim with the corrected codes.  I will have the note the patient dropped off to [place] outlining his concerns scanned into his chart.

## 2022-05-05 ENCOUNTER — Ambulatory Visit (HOSPITAL_COMMUNITY)
Admission: RE | Admit: 2022-05-05 | Discharge: 2022-05-05 | Disposition: A | Discharge status: Home / Self Care | Payer: Medicare Other | Source: Ambulatory Visit | Attending: Internal Medicine | Admitting: Internal Medicine

## 2022-05-05 DIAGNOSIS — M25562 Pain in left knee: Secondary | ICD-10-CM | POA: Diagnosis not present

## 2022-05-05 DIAGNOSIS — M79604 Pain in right leg: Secondary | ICD-10-CM

## 2022-05-05 DIAGNOSIS — I998 Other disorder of circulatory system: Secondary | ICD-10-CM | POA: Diagnosis not present

## 2022-05-05 DIAGNOSIS — M25561 Pain in right knee: Secondary | ICD-10-CM | POA: Insufficient documentation

## 2022-05-06 ENCOUNTER — Emergency Department (HOSPITAL_BASED_OUTPATIENT_CLINIC_OR_DEPARTMENT_OTHER): Payer: Medicare Other

## 2022-05-06 ENCOUNTER — Encounter (HOSPITAL_BASED_OUTPATIENT_CLINIC_OR_DEPARTMENT_OTHER): Payer: Self-pay | Admitting: Emergency Medicine

## 2022-05-06 ENCOUNTER — Emergency Department (HOSPITAL_BASED_OUTPATIENT_CLINIC_OR_DEPARTMENT_OTHER)
Admission: EM | Admit: 2022-05-06 | Discharge: 2022-05-06 | Disposition: A | Discharge status: Home / Self Care | Payer: Medicare Other | Attending: Emergency Medicine | Admitting: Emergency Medicine

## 2022-05-06 ENCOUNTER — Other Ambulatory Visit: Payer: Self-pay

## 2022-05-06 DIAGNOSIS — W01198A Fall on same level from slipping, tripping and stumbling with subsequent striking against other object, initial encounter: Secondary | ICD-10-CM | POA: Insufficient documentation

## 2022-05-06 DIAGNOSIS — M2578 Osteophyte, vertebrae: Secondary | ICD-10-CM | POA: Diagnosis not present

## 2022-05-06 DIAGNOSIS — S0990XA Unspecified injury of head, initial encounter: Secondary | ICD-10-CM | POA: Diagnosis not present

## 2022-05-06 DIAGNOSIS — W19XXXA Unspecified fall, initial encounter: Secondary | ICD-10-CM

## 2022-05-06 DIAGNOSIS — Z7901 Long term (current) use of anticoagulants: Secondary | ICD-10-CM | POA: Insufficient documentation

## 2022-05-06 DIAGNOSIS — M47812 Spondylosis without myelopathy or radiculopathy, cervical region: Secondary | ICD-10-CM | POA: Diagnosis not present

## 2022-05-06 DIAGNOSIS — M4802 Spinal stenosis, cervical region: Secondary | ICD-10-CM | POA: Diagnosis not present

## 2022-05-06 DIAGNOSIS — S0101XA Laceration without foreign body of scalp, initial encounter: Secondary | ICD-10-CM | POA: Diagnosis not present

## 2022-05-06 DIAGNOSIS — S199XXA Unspecified injury of neck, initial encounter: Secondary | ICD-10-CM | POA: Diagnosis not present

## 2022-05-06 MED ORDER — LIDOCAINE HCL 2 % IJ SOLN
10.0000 mL | Freq: Once | INTRAMUSCULAR | Status: AC
  Administered 2022-05-06: 200 mg
  Filled 2022-05-06: qty 20

## 2022-05-06 MED ORDER — BUPIVACAINE HCL (PF) 0.25 % IJ SOLN: 10.0000 mL | Freq: Once | INTRAMUSCULAR | Status: DC

## 2022-05-06 MED ORDER — OXYCODONE-ACETAMINOPHEN 5-325 MG PO TABS
1.0000 | ORAL_TABLET | Freq: Once | ORAL | Status: AC
  Administered 2022-05-06: 1 via ORAL
  Filled 2022-05-06: qty 1

## 2022-05-06 MED ORDER — OXYCODONE-ACETAMINOPHEN 7.5-325 MG PO TABS: 1.0000 | ORAL_TABLET | Freq: Once | ORAL | Status: DC

## 2022-05-06 NOTE — ED Provider Notes (Signed)
[facility] EMERGENCY DEPARTMENT Provider Note   CSN: [id number] Arrival date & time: 05/06/22  0830     History  Chief Complaint  Patient presents with   Raymond Maclachlan Sr. is a 71 y.o. male with a past medical history of DVT of the left lower extremity on Coumadin presenting today as a level 2 trauma after ground-level fall.  He reports that around 6 AM he took a Percocet for his right foot pain secondary to atheroembolism and then his dog started barking.  He got out of bed to check on his dog and believes the walker is not stuck on the chair next to the door.  Says that he stumbled and fell into the door frame.  Struck his head.  Did not lose consciousness.  No history of seizure or syncope.  Was able to get up on his own.  Neighbor came and helped him and they noted only mild amount of blood on the door frame.  Bleeding was controlled.  He decided to come be seen because he is on blood thinners, denies any dizziness, lightheadedness, visual disturbance, headache or pain outside of his lower extremity.   Fall       Home Medications Prior to Admission medications   Medication Sig Start Date End Date Taking? Authorizing Provider  clindamycin (CLEOCIN) 300 MG capsule Take 1 capsule (300 mg total) by mouth 3 (three) times daily for 10 days. 05/04/22 05/14/22 Yes Louann Sjogren, DPM  gabapentin (NEURONTIN) 100 MG capsule Take 200 mg by mouth daily. 02/20/22  Yes [provider]  hydrochlorothiazide (MICROZIDE) 12.5 MG capsule Take 1 capsule (12.5 mg total) by mouth daily. 07/14/21  Yes Olive Bass, FNP  HYDROcodone-acetaminophen (NORCO/VICODIN) 5-325 MG tablet Take 1 tablet by mouth every 6 (six) hours as needed. 04/27/22  Yes Tanda Rockers A, DO  losartan (COZAAR) 50 MG tablet Take 1 tablet (50 mg total) by mouth daily. 07/14/21  Yes Olive Bass, FNP  nitroGLYCERIN (NITROGLYN) 2 % ointment Apply 0.5 inches topically 4 (four) times daily.  04/28/22  Yes Rolan Bucco, MD  oxyCODONE-acetaminophen (PERCOCET) 7.5-325 MG tablet Take 1 tablet by mouth every 4 (four) hours as needed for severe pain. 05/02/22  Yes Leonie Douglas, MD  warfarin (COUMADIN) 5 MG tablet Take 1 tablet (5 mg total) by mouth daily. 04/14/22  Yes Josph Macho, MD  atorvastatin (LIPITOR) 10 MG tablet Take 1 tablet (10 mg total) by mouth daily. 01/06/22   Olive Bass, FNP  cholecalciferol (VITAMIN D3) 25 MCG (1000 UNIT) tablet Take 1,000 Units by mouth daily.    [provider]  fluoruracil (FLUOROPLEX) 1 % cream 1 application to affected area    [provider]  hydrocortisone cream 1 % Apply 1 application topically 2 (two) times daily.    [provider]  naproxen sodium (ALEVE) 220 MG tablet Take 220 mg by mouth as needed. Takes before playing tennis.    [provider]  Turmeric 500 MG CAPS 1 capsule    [provider]      Allergies    Penicillins and Penicillin v potassium    Review of Systems   Review of Systems  Physical Exam Updated Vital Signs BP (!) 140/104 (BP Location: Right Arm)   Pulse 100   Temp 98.3 F (36.8 C) (Oral)   Resp 17   SpO2 95%  Physical Exam Vitals and nursing note reviewed.  Constitutional:  Appearance: Normal appearance.  HENT:     Head: Normocephalic and atraumatic.     Comments: 1.5 cm laceration to the patient's left parietal skull Eyes:     General: No scleral icterus.    Extraocular Movements: Extraocular movements intact.     Conjunctiva/sclera: Conjunctivae normal.     Pupils: Pupils are equal, round, and reactive to light.  Pulmonary:     Effort: Pulmonary effort is normal. No respiratory distress.  Skin:    Findings: No rash.  Neurological:     Mental Status: He is alert and oriented to person, place, and time.     Cranial Nerves: No cranial nerve deficit.     Motor: No weakness.     Coordination: Coordination normal.  Psychiatric:         Mood and Affect: Mood normal.        Behavior: Behavior normal.     ED Results / Procedures / Treatments   Labs (all labs ordered are listed, but only abnormal results are displayed) Labs Reviewed - No data to display  EKG None  Radiology CT HEAD WO CONTRAST  Result Date: 05/06/2022 CLINICAL DATA:  Patient states he fell this morning. Hit his head on the door and has some bleeding. Currently on Coumadin. EXAM: CT HEAD WITHOUT CONTRAST CT CERVICAL SPINE WITHOUT CONTRAST TECHNIQUE: Multidetector CT imaging of the head and cervical spine was performed following the standard protocol without intravenous contrast. Multiplanar CT image reconstructions of the cervical spine were also generated. RADIATION DOSE REDUCTION: This exam was performed according to the departmental dose-optimization program which includes automated exposure control, adjustment of the mA and/or kV according to patient size and/or use of iterative reconstruction technique. COMPARISON:  None Available. FINDINGS: CT HEAD FINDINGS Brain: No evidence of acute infarction, hemorrhage, hydrocephalus, extra-axial collection or mass lesion/mass effect. Vascular: No hyperdense vessel or unexpected calcification. Skull: Normal. Negative for fracture or focal lesion. Sinuses/Orbits: No acute finding. Other: None. CT CERVICAL SPINE FINDINGS Alignment: Straightening of the cervical spine. Skull base and vertebrae: No acute fracture. No primary bone lesion or focal pathologic process. Soft tissues and spinal canal: No prevertebral fluid or swelling. No visible canal hematoma. Disc levels: C2-C3: Moderate bilateral facet joint arthropathy, right worse than the left with mild right neural foraminal stenosis. Disc osteophyte complex without significant spinal canal stenosis. C3-C4: Disc osteophyte complex and uncovertebral joint arthropathy. Mild bilateral facet joint arthropathy. Mild bilateral neural foraminal stenosis. C4-C5: Moderate to severe  right facet joint arthropathy with right neural foraminal stenosis. No significant spinal canal or left neural foraminal stenosis. C5-C6: Disc height loss with prominent uncovertebral joint arthropathy. Moderate bilateral neural foraminal stenosis. Mild spinal canal stenosis. C6-C7: Disc height loss and uncovertebral joint arthropathy with moderate bilateral neural foraminal stenosis. C7-T1: Moderate bilateral facet joint arthropathy. No significant spinal canal or neural foraminal stenosis. Upper chest: Negative. Other: None IMPRESSION: 1.  No acute intracranial abnormality. 2.  No acute cervical spine fracture or traumatic subluxation. 3. Advanced multilevel degenerate disc disease with associated uncovertebral joint and facet joint arthropathy as detailed above. Electronically Signed   By: Larose Hires D.O.   On: 05/06/2022 10:33   CT CERVICAL SPINE WO CONTRAST  Result Date: 05/06/2022 CLINICAL DATA:  Patient states he fell this morning. Hit his head on the door and has some bleeding. Currently on Coumadin. EXAM: CT HEAD WITHOUT CONTRAST CT CERVICAL SPINE WITHOUT CONTRAST TECHNIQUE: Multidetector CT imaging of the head and cervical spine was performed following the standard  protocol without intravenous contrast. Multiplanar CT image reconstructions of the cervical spine were also generated. RADIATION DOSE REDUCTION: This exam was performed according to the departmental dose-optimization program which includes automated exposure control, adjustment of the mA and/or kV according to patient size and/or use of iterative reconstruction technique. COMPARISON:  None Available. FINDINGS: CT HEAD FINDINGS Brain: No evidence of acute infarction, hemorrhage, hydrocephalus, extra-axial collection or mass lesion/mass effect. Vascular: No hyperdense vessel or unexpected calcification. Skull: Normal. Negative for fracture or focal lesion. Sinuses/Orbits: No acute finding. Other: None. CT CERVICAL SPINE FINDINGS Alignment:  Straightening of the cervical spine. Skull base and vertebrae: No acute fracture. No primary bone lesion or focal pathologic process. Soft tissues and spinal canal: No prevertebral fluid or swelling. No visible canal hematoma. Disc levels: C2-C3: Moderate bilateral facet joint arthropathy, right worse than the left with mild right neural foraminal stenosis. Disc osteophyte complex without significant spinal canal stenosis. C3-C4: Disc osteophyte complex and uncovertebral joint arthropathy. Mild bilateral facet joint arthropathy. Mild bilateral neural foraminal stenosis. C4-C5: Moderate to severe right facet joint arthropathy with right neural foraminal stenosis. No significant spinal canal or left neural foraminal stenosis. C5-C6: Disc height loss with prominent uncovertebral joint arthropathy. Moderate bilateral neural foraminal stenosis. Mild spinal canal stenosis. C6-C7: Disc height loss and uncovertebral joint arthropathy with moderate bilateral neural foraminal stenosis. C7-T1: Moderate bilateral facet joint arthropathy. No significant spinal canal or neural foraminal stenosis. Upper chest: Negative. Other: None IMPRESSION: 1.  No acute intracranial abnormality. 2.  No acute cervical spine fracture or traumatic subluxation. 3. Advanced multilevel degenerate disc disease with associated uncovertebral joint and facet joint arthropathy as detailed above. Electronically Signed   By: Larose Hires D.O.   On: 05/06/2022 10:33   Procedures .Marland KitchenLaceration Repair  Date/Time: 05/06/2022 10:43 AM  Performed by: Saddie Benders, PA-C Authorized by: Saddie Benders, PA-C   Consent:    Consent obtained:  Verbal   Consent given by:  Patient   Risks discussed:  Infection and pain Universal protocol:    Patient identity confirmed:  Verbally with patient Anesthesia:    Anesthesia method:  Local infiltration   Local anesthetic:  Lidocaine 2% WITH epi Laceration details:    Location:  Scalp   Scalp location:   L parietal   Length (cm):  1.5 Pre-procedure details:    Preparation:  Imaging obtained to evaluate for foreign bodies Exploration:    Hemostasis achieved with:  Direct pressure   Imaging outcome: foreign body not noted     Wound exploration: wound explored through full range of motion     Contaminated: no   Treatment:    Area cleansed with:  Povidone-iodine and saline   Amount of cleaning:  Standard   Irrigation solution:  Sterile saline   Irrigation method:  Syringe   Visualized foreign bodies/material removed: no   Skin repair:    Repair method:  Staples   Number of staples:  2 Approximation:    Approximation:  Close Repair type:    Repair type:  Simple Post-procedure details:    Dressing:  Open (no dressing)   Procedure completion:  Tolerated    Medications Ordered in ED Medications  bupivacaine (MARCAINE) 0.25 % (with pres) injection 10 mL (has no administration in time range)  oxyCODONE-acetaminophen (PERCOCET/ROXICET) 5-325 MG per tablet 1 tablet (1 tablet Oral Given 05/06/22 0913)    ED Course/ Medical Decision Making/ A&P  Medical Decision Making Amount and/or Complexity of Data Reviewed Radiology: ordered.  Risk Prescription drug management.   This patient presents to the ED for concern of fall on blood thinners.  Differential includes but is not limited to subdural, epidural, subarachnoid, concussion, hip fracture, rib fracture, seizure and syncope.   This is not an exhaustive differential.    Past Medical History / Co-morbidities / Social History: Recently diagnosed ischemia to the right first toes.  For this reason he is now walking with a walker.  He has only been doing this for the past couple days.  Follows with Dr. Myna Hidalgo and now podiatry as of 2 days ago.   Additional history: Obtained from chart review.  Patient has followed with vascular since his DVT.  Recently transition from Eliquis to Coumadin secondary to cost.    Physical Exam: No pertinent findings.  Normal neurologic exam. 1.5cm laceration  Lab Tests: I ordered, and personally interpreted labs.  The pertinent results include: None. Limited workup for the level 2 trauma. Well-appearing, alert and bleeding controlled.   Imaging Studies: I ordered and independently visualized and interpreted head and neck CT which showed DDD of the cervical spine, which was known. I agree with the radiologist interpretation.   Medications: I ordered medication including Percocet. Reevaluation of the patient after these medicines showed that the patient improved. I have reviewed the patients home medicines and have made adjustments as needed.    Disposition: After consideration of the diagnostic results and the patients response to treatment, I feel that patient is stable for discharge home.  Laceration has been repaired with 2 staples.  Remains neurologically intact.  No other deformities, tenderness or injuries.  He is comfortable and would like discharge at this time.  He believes his tetanus shot is up-to-date.  We will follow-up with PCP.   I discussed this case with my attending physician Dr. Leanna Sato who cosigned this note including patient's presenting symptoms, physical exam, and planned diagnostics and interventions. Attending physician stated agreement with plan or made changes to plan which were implemented.     Final Clinical Impression(s) / ED Diagnoses Final diagnoses:  Fall, initial encounter  Laceration of scalp, initial encounter    Rx / DC Orders ED Discharge Orders     None      Results and diagnoses were explained to the patient. Return precautions discussed in full. Patient had no additional questions and expressed complete understanding.   This chart was dictated using voice recognition software.  Despite best efforts to proofread,  errors can occur which can change the documentation meaning.    Saddie Benders, PA-C 05/06/22  1045    Franne Forts, DO 05/07/22 1413

## 2022-05-08 ENCOUNTER — Telehealth: Payer: Self-pay | Admitting: *Deleted

## 2022-05-08 NOTE — Telephone Encounter (Signed)
Call placed back to patient and message left to notify him that [place] of left leg can be done this Wednesday at 0900 at [facility] and to call office back if this time does not work for him.

## 2022-05-10 ENCOUNTER — Ambulatory Visit (HOSPITAL_BASED_OUTPATIENT_CLINIC_OR_DEPARTMENT_OTHER)
Admission: RE | Admit: 2022-05-10 | Discharge: 2022-05-10 | Disposition: A | Discharge status: Home / Self Care | Payer: Medicare Other | Source: Ambulatory Visit | Attending: Hematology & Oncology | Admitting: Hematology & Oncology

## 2022-05-10 ENCOUNTER — Encounter: Payer: Self-pay | Admitting: Hematology & Oncology

## 2022-05-10 ENCOUNTER — Inpatient Hospital Stay: Payer: Medicare Other

## 2022-05-10 ENCOUNTER — Inpatient Hospital Stay (HOSPITAL_BASED_OUTPATIENT_CLINIC_OR_DEPARTMENT_OTHER): Payer: Medicare Other | Admitting: Hematology & Oncology

## 2022-05-10 ENCOUNTER — Other Ambulatory Visit: Payer: Self-pay | Admitting: *Deleted

## 2022-05-10 ENCOUNTER — Other Ambulatory Visit: Payer: Self-pay

## 2022-05-10 VITALS — BP 142/79 | HR 82 | Temp 97.9°F | Resp 18 | Wt 178.0 lb

## 2022-05-10 DIAGNOSIS — Z86718 Personal history of other venous thrombosis and embolism: Secondary | ICD-10-CM

## 2022-05-10 DIAGNOSIS — I82502 Chronic embolism and thrombosis of unspecified deep veins of left lower extremity: Secondary | ICD-10-CM | POA: Insufficient documentation

## 2022-05-10 DIAGNOSIS — Z79899 Other long term (current) drug therapy: Secondary | ICD-10-CM | POA: Diagnosis not present

## 2022-05-10 DIAGNOSIS — Z7902 Long term (current) use of antithrombotics/antiplatelets: Secondary | ICD-10-CM | POA: Diagnosis not present

## 2022-05-10 DIAGNOSIS — I82402 Acute embolism and thrombosis of unspecified deep veins of left lower extremity: Secondary | ICD-10-CM

## 2022-05-10 DIAGNOSIS — I82432 Acute embolism and thrombosis of left popliteal vein: Secondary | ICD-10-CM | POA: Diagnosis not present

## 2022-05-10 DIAGNOSIS — M7989 Other specified soft tissue disorders: Secondary | ICD-10-CM

## 2022-05-10 DIAGNOSIS — I82412 Acute embolism and thrombosis of left femoral vein: Secondary | ICD-10-CM | POA: Diagnosis not present

## 2022-05-10 LAB — CMP (CANCER CENTER ONLY)
ALT: 19 U/L (ref 0–44)
AST: 24 U/L (ref 15–41)
Albumin: 4.1 g/dL (ref 3.5–5.0)
Alkaline Phosphatase: 79 U/L (ref 38–126)
Anion gap: 8 (ref 5–15)
BUN: 21 mg/dL (ref 8–23)
CO2: 29 mmol/L (ref 22–32)
Calcium: 9.4 mg/dL (ref 8.9–10.3)
Chloride: 102 mmol/L (ref 98–111)
Creatinine: 0.89 mg/dL (ref 0.61–1.24)
GFR, Estimated: >60 mL/min (ref >60)
Glucose, Bld: 106 mg/dL — ABNORMAL HIGH (ref 70–99)
Potassium: 4.4 mmol/L (ref 3.5–5.1)
Sodium: 139 mmol/L (ref 135–145)
Total Bilirubin: 0.8 mg/dL (ref 0.3–1.2)
Total Protein: 7.3 g/dL (ref 6.5–8.1)

## 2022-05-10 LAB — CBC WITH DIFFERENTIAL (CANCER CENTER ONLY)
Abs Immature Granulocytes: 0.03 10*3/uL (ref 0.00–0.07)
Basophils Absolute: 0.1 10*3/uL (ref 0.0–0.1)
Basophils Relative: 1 %
Eosinophils Absolute: 0.1 10*3/uL (ref 0.0–0.5)
Eosinophils Relative: 1 %
HCT: 42.1 % (ref 39.0–52.0)
Hemoglobin: 13.6 g/dL (ref 13.0–17.0)
Immature Granulocytes: 0 %
Lymphocytes Relative: 28 %
Lymphs Abs: 2.7 10*3/uL (ref 0.7–4.0)
MCH: 31.6 pg (ref 26.0–34.0)
MCHC: 32.3 g/dL (ref 30.0–36.0)
MCV: 97.9 fL (ref 80.0–100.0)
Monocytes Absolute: 0.7 10*3/uL (ref 0.1–1.0)
Monocytes Relative: 7 %
Neutro Abs: 5.9 10*3/uL (ref 1.7–7.7)
Neutrophils Relative %: 63 %
Platelet Count: 316 10*3/uL (ref 150–400)
RBC: 4.3 MIL/uL (ref 4.22–5.81)
RDW: 12.2 % (ref 11.5–15.5)
WBC Count: 9.4 10*3/uL (ref 4.0–10.5)
nRBC: 0 % (ref 0.0–0.2)

## 2022-05-10 LAB — PROTIME-INR
INR: 2.4 — ABNORMAL HIGH (ref 0.8–1.2)
Prothrombin Time: 26 seconds — ABNORMAL HIGH (ref 11.4–15.2)

## 2022-05-10 LAB — D-DIMER, QUANTITATIVE: D-Dimer, Quant: 0.43 ug/mL-FEU (ref 0.00–0.50)

## 2022-05-10 MED ORDER — DABIGATRAN ETEXILATE MESYLATE 150 MG PO CAPS: 150.0000 mg | ORAL_CAPSULE | Freq: Two times a day (BID) | ORAL | 6 refills | Status: DC

## 2022-05-10 NOTE — Progress Notes (Signed)
Hematology and Oncology Follow Up Visit  Raymond Tadros Sr. [id number] 19-Mar-1951 71 y.o. 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  I had him come in because of the fact that we did a Doppler of his left leg today.  Suppository, the Doppler showed that there was some apparent progression of a thrombus in the left femoral vein and a new thrombus in the left gastrocnemius vein.  The problem that he really has had has been in the right leg.  He has been seen vascular surgery.  They are doing a good job with him.  He currently is undergoing hyperbaric oxygen therapy.  I think he has had 9 treatments so far.  Now he is planning a total of 60 treatments.  I really think that we have to get him off the Coumadin and try him on a more targeted anticoagulant.  I think that we could try him on Pradaxa.  This is an Antithrombin inhibitor.  I think this would make sense.  I just hate that we I have an associates of time with his thromboembolic disease.  I know he is trying his best.  He is doing everything that we have asked him to do.  Hopefully, there will be any issues with surgery for his right foot.  I think he sees podiatry tomorrow.  He has had no problems with bleeding.  His INR today was 2.4.  Again, given the Doppler report, I just have a feeling that going to have to make a change with his anticoagulation.  There is no obvious etiology for his thromboembolic disease.  We did a hypercoagulable panel on him and everything came up normal.    I do think that Pradaxa would be a reasonable option for him.  I really hope that his insurance will cover this.  He was not tolerant of Eliquis.  Again, Coumadin has not been effective for him.  I would try him on 150 mg p.o. twice daily.  Since the INR is 2.4.   I told him to start the Pradaxa tomorrow.  I forgot to mention that he did fall over the weekend.  He lost his balance.  He hit his head.  Thankfully, there was  trauma.  He had no bleeding.  He had a CT scan.  This was of the cervical spine and brain.  These were all normal.  At the present time, his performance status is ECOG 2.   Medications:  Current Outpatient Medications:    dabigatran (PRADAXA) 150 MG CAPS capsule, Take 1 capsule (150 mg total) by mouth 2 (two) times daily., Disp: 60 capsule, Rfl: 6   atorvastatin (LIPITOR) 10 MG tablet, Take 1 tablet (10 mg total) by mouth daily., Disp: 90 tablet, Rfl: 3   cholecalciferol (VITAMIN D3) 25 MCG (1000 UNIT) tablet, Take 1,000 Units by mouth daily., Disp: , Rfl:    clindamycin (CLEOCIN) 300 MG capsule, Take 1 capsule (300 mg total) by mouth 3 (three) times daily for 10 days., Disp: 30 capsule, Rfl: 0   fluoruracil (FLUOROPLEX) 1 % cream, 1 application to affected area, Disp: , Rfl:    gabapentin (NEURONTIN) 100 MG capsule, Take 200 mg by mouth daily., Disp: , Rfl:    hydrochlorothiazide (MICROZIDE) 12.5 MG capsule, Take 1 capsule (  12.5 mg total) by mouth daily., Disp: 90 capsule, Rfl: 1   HYDROcodone-acetaminophen (NORCO/VICODIN) 5-325 MG tablet, Take 1 tablet by mouth every 6 (six) hours as needed., Disp: 8 tablet, Rfl: 0   hydrocortisone cream 1 %, Apply 1 application topically 2 (two) times daily., Disp: , Rfl:    losartan (COZAAR) 50 MG tablet, Take 1 tablet (50 mg total) by mouth daily., Disp: 90 tablet, Rfl: 1   naproxen sodium (ALEVE) 220 MG tablet, Take 220 mg by mouth as needed. Takes before playing tennis., Disp: , Rfl:    nitroGLYCERIN (NITROGLYN) 2 % ointment, Apply 0.5 inches topically 4 (four) times daily., Disp: 30 g, Rfl: 0   oxyCODONE-acetaminophen (PERCOCET) 7.5-325 MG tablet, Take 1 tablet by mouth every 4 (four) hours as needed for severe pain., Disp: 45 tablet, Rfl: 0   Turmeric 500 MG CAPS, 1 capsule, Disp: , Rfl:    Allergies:  Allergies  Allergen Reactions   Penicillins Hives and Rash    Severe Rash    Penicillin V Potassium Other (See Comments)    Past Medical History, Surgical history, Social history, and Family History were reviewed and updated.  Review of Systems: Review of Systems  HENT:  Negative.    Eyes: Negative.   Respiratory: Negative.    Cardiovascular:  Positive for leg swelling.  Gastrointestinal: Negative.   Endocrine: Negative.   Genitourinary: Negative.    Musculoskeletal: Negative.   Skin: Negative.   Neurological: Negative.   Hematological: Negative.   Psychiatric/Behavioral:  Positive for sleep disturbance.     Physical Exam:  weight is 178 lb (80.7 kg). His oral temperature is 97.9 F (36.6 C). His blood pressure is 142/79 (abnormal) and his pulse is 82. His respiration is 18 and oxygen saturation is 94%.   Wt Readings from Last 3 Encounters:  05/10/22 178 lb (80.7 kg)  05/02/22 183 lb (83 kg)  05/01/22 183 lb (83 kg)    Physical Exam Vitals reviewed.  HENT:     Head: Normocephalic and atraumatic.  Eyes:     Pupils: Pupils are equal, round, and reactive to light.  Cardiovascular:     Rate and Rhythm: Normal rate and regular rhythm.     Heart sounds: Normal heart sounds.  Pulmonary:     Effort: Pulmonary effort is normal.     Breath sounds: Normal breath sounds.  Abdominal:     General: Bowel sounds are normal.     Palpations: Abdomen is soft.  Musculoskeletal:        General: No tenderness or deformity. Normal range of motion.     Cervical back: Normal range of motion.     Comments: He has ischemia with the second and first toes of the right foot.  They are little bit cool.  The rest of his foot does seem to be of normal temperature.  He has tenderness when his second and first toe are palpated.  He has limited range of motion.  There may be a little bit of ischemia of the third toe.  The fourth and fifth toes of the right foot seem to be okay.   Lymphadenopathy:     Cervical: No cervical adenopathy.  Skin:    General: Skin is warm and dry.     Findings: No erythema or rash.  Neurological:     Mental Status: He is alert and oriented to person, place, and time.  Psychiatric:        Behavior: Behavior normal.  Thought Content: Thought content normal.        Judgment: Judgment normal.      Lab Results  Component Value Date   WBC 9.4 05/10/2022   HGB 13.6 05/10/2022   HCT 42.1 05/10/2022   MCV 97.9 05/10/2022   PLT 316 05/10/2022     Chemistry      Component Value Date/Time   NA 139 05/10/2022 1331   K 4.4 05/10/2022 1331   CL 102 05/10/2022 1331   CO2 29 05/10/2022 1331   BUN 21 05/10/2022 1331   CREATININE 0.89 05/10/2022 1331      Component Value Date/Time   CALCIUM 9.4 05/10/2022 1331   ALKPHOS 79 05/10/2022 1331   AST 24 05/10/2022 1331   ALT 19 05/10/2022 1331   BILITOT 0.8 05/10/2022 1331      Impression and Plan: Raymond [name] is a very nice 71 year old white male.  He has a thrombus in the left leg.  This was idiopathic from all of our studies.  Again he is very interesting.  He has a PhD in psychotherapy.  He loves classic rock of the 56s.  I really hate that we had to make a change.  However, I think this is probably for the the best.  Again we will try him on the Pradaxa.  Being that this is an thrombin inhibitor, I think this is a reasonable option for him.  He has been followed by podiatry.  He is being followed by Vascular Surgery.  He is doing his best to try to prevent him from losing his toes on the right foot or his right foot.  We will probably plan for another Doppler of his left leg in about 6 weeks or so.  I would like to see him back in about 3 weeks.  I want to make sure that everything is going okay with his Pradaxa.  He will continue the hyperbaric oxygen.  Again I think this is a very good idea for him.     [name], MD 6/28/20232:47 PM

## 2022-05-11 ENCOUNTER — Encounter: Payer: Self-pay | Admitting: Family Medicine

## 2022-05-11 ENCOUNTER — Telehealth: Payer: Self-pay | Admitting: *Deleted

## 2022-05-11 ENCOUNTER — Encounter: Payer: Self-pay | Admitting: Podiatry

## 2022-05-11 ENCOUNTER — Ambulatory Visit (INDEPENDENT_AMBULATORY_CARE_PROVIDER_SITE_OTHER): Payer: Medicare Other | Admitting: Podiatry

## 2022-05-11 ENCOUNTER — Ambulatory Visit (INDEPENDENT_AMBULATORY_CARE_PROVIDER_SITE_OTHER): Payer: Medicare Other | Admitting: Family Medicine

## 2022-05-11 VITALS — BP 151/82 | HR 84 | Ht 71.0 in | Wt 178.6 lb

## 2022-05-11 DIAGNOSIS — I96 Gangrene, not elsewhere classified: Secondary | ICD-10-CM | POA: Diagnosis not present

## 2022-05-11 DIAGNOSIS — Z09 Encounter for follow-up examination after completed treatment for conditions other than malignant neoplasm: Secondary | ICD-10-CM

## 2022-05-11 DIAGNOSIS — I75021 Atheroembolism of right lower extremity: Secondary | ICD-10-CM | POA: Diagnosis not present

## 2022-05-11 DIAGNOSIS — Z4802 Encounter for removal of sutures: Secondary | ICD-10-CM

## 2022-05-11 MED ORDER — OXYCODONE-ACETAMINOPHEN 7.5-325 MG PO TABS: 1.0000 | ORAL_TABLET | ORAL | 0 refills | Status: DC | PRN

## 2022-05-11 NOTE — Progress Notes (Signed)
  Subjective:  Patient ID: Raymond Fellows., male    DOB: 1951/09/18,   MRN: [id number]  Chief Complaint  Patient presents with   Wound Check    Left foot wound check    71 y.o. male presents for follow-up of blue toes on the right foot. Relates he is doing much better and thinks the toes are improving. Has been using Hyperbaric oxygen therapy which he believes is helping. He has stopped using the nitropast and just the betadine. Relates pain is improving but hoping for refill on medicine to help with sleep.  . Denies any other pedal complaints. Denies n/v/f/c.   Past Medical History:  Diagnosis Date   DVT (deep venous thrombosis) (HCC)    GERD (gastroesophageal reflux disease)    Hyperlipidemia    Hypertension    Leg DVT (deep venous thromboembolism), chronic, left ([place]) 12/12/2021    Objective:  Physical Exam: Vascular: DP/PT pulses 2/4 bilateral. CFT <3 seconds. Normal hair growth on digits. No edema.  Skin. No lacerations or abrasions bilateral feet. Duskiness of digits has improved. There are necrotic areas noted to first through fourth toes especially the second but have vastly improved. No maceration noted. Tenderness improved.  Musculoskeletal: MMT 5/5 bilateral lower extremities in DF, PF, Inversion and Eversion. Deceased ROM in DF of ankle joint.  Neurological: Sensation intact to light touch.   Assessment:   1. Blue toe syndrome of right lower extremity ([place])   2. Dry gangrene ([place])      Plan:  Patient was evaluated and treated and all questions answered. Blue toe syndrome of first-fourth digits with some dry gangrene setting in.  -No debridement today X-rays reviewed. No osseous erosions noted.  -Dressed with betadine, DSD. Will change daily at night.  -Off-loading with surgical shoe. -No abx necessary.  -Discussed glucose control and proper protein-rich diet.  -Discussed he is at high risk for toe and foot amputations on this foot. Discussed with keep  foot clean until we can wait for it to demarcate. Toe looking much healthier today. Still some concern for second digit and salvagability but will continue to watch.  Refill on percocet provided.  -Discussed if any worsening redness, pain, fever or chills to call or may need to report to the emergency room. Patient expressed understanding.     No follow-ups on file.   Lorenda Peck, DPM

## 2022-05-11 NOTE — Progress Notes (Signed)
   Acute Office Visit  Subjective:     Patient ID: Raymond Grieser Sr., male    DOB: 12-10-1950, 71 y.o.   MRN: [id number]  CC: ED follow-up, staple removal   HPI Patient is in today for hospital follow-up, staple removal.   05/06/22 ED visit for fall. His walker got stuck while getting up during the night to check on his dog. He hit his head on the door frame, but did not lose consciousness. Got himself up and noticed only a small amount of bleeding, but since he is on blood thinners, he went to the ED. Received 2 staples to head. Tolearted well.  Head/Cervical spine CT = no acute changes  He is following with Dr. Marin Olp, vascular, and podiatry for anticoagulation -  DVT, ischemia to right toes. Most recently saw Dr. Marin Olp ysterday and was changed to  Pradaxa.  He is seeing podiatry today.   Today reports he is doing well. No new symptoms. He would like staples removed. Reports he has been seeing really good benefits overall using hyperbaric chamber recently.      ROS All review of systems negative except what is listed in the HPI      Objective:    BP (!) 151/82   Pulse 84   Ht '5\' 11"'$  (1.803 m)   Wt 178 lb 9.6 oz (81 kg)   BMI 24.91 kg/m    Physical Exam Vitals reviewed.  Constitutional:      General: He is not in acute distress.    Appearance: Normal appearance. He is not ill-appearing.  Musculoskeletal:        General: Normal range of motion.  Skin:    General: Skin is warm and dry.     Comments: Laceration to scalp with 2 staples healing nicely, edges approximate, no drainage/bleeding, erythema, edema, etc.  Neurological:     General: No focal deficit present.     Mental Status: He is alert and oriented to person, place, and time. Mental status is at baseline.  Psychiatric:        Mood and Affect: Mood normal.        Behavior: Behavior normal.        Thought Content: Thought content normal.        Judgment: Judgment normal.        No results  found for any visits on 05/11/22.      Assessment & Plan:   Problem List Items Addressed This Visit   None Visit Diagnoses     Encounter for staple removal    [facility] discharge follow-up     Patient is doing really well. Has already been following up with his specialists. No new concerns today. He is aware that it may be a few days early based on guidelines to remove staples, but area is healing nicely and he would prefer to go ahead and have them removed today.  Area cleansed and staples removed without difficulty or dehiscence. Attempted steri-strips for precaution, but hair interference. Patient aware of signs/symptoms requiring further/urgent evaluation.        No orders of the defined types were placed in this encounter.   Return if symptoms worsen or fail to improve.  Terrilyn Saver, NP

## 2022-05-11 NOTE — Telephone Encounter (Signed)
Message received from patient stating that Pradaxa will not be available for pick up from his pharmacy until Saturday.  He would like to know if he should continue Coumadin until the Pradaxa is available for pick up.  Dr. Marin Olp notified.  Call placed back to patient and patient notified per order of Dr. Marin Olp to continue Coumadin until Pradaxa is available.  Pt is appreciative of call back and has no further questions at this time.

## 2022-05-12 ENCOUNTER — Ambulatory Visit: Payer: Medicare Other | Admitting: Podiatry

## 2022-05-18 ENCOUNTER — Telehealth: Payer: Self-pay | Admitting: Family Medicine

## 2022-05-18 NOTE — Telephone Encounter (Signed)
Patient called stating that he is scheduled for the NCS/EMG on August 11th but he has had some new developments in his medial condition and does not know if it would be appropriate to keep the scheduled appointment for the NCS/EMG?  He asked if someone could call him back to discuss further.  Please advise.

## 2022-05-19 NOTE — Telephone Encounter (Signed)
Sent patient a MyChart message

## 2022-05-25 ENCOUNTER — Inpatient Hospital Stay (HOSPITAL_BASED_OUTPATIENT_CLINIC_OR_DEPARTMENT_OTHER): Payer: Medicare Other | Admitting: Family

## 2022-05-25 ENCOUNTER — Inpatient Hospital Stay: Payer: Medicare Other | Attending: Family

## 2022-05-25 ENCOUNTER — Encounter: Payer: Self-pay | Admitting: Family

## 2022-05-25 ENCOUNTER — Encounter: Payer: Self-pay | Admitting: Podiatry

## 2022-05-25 ENCOUNTER — Ambulatory Visit (INDEPENDENT_AMBULATORY_CARE_PROVIDER_SITE_OTHER): Payer: Medicare Other | Admitting: Podiatry

## 2022-05-25 VITALS — BP 125/81 | HR 81 | Resp 17 | Ht 71.0 in | Wt 173.8 lb

## 2022-05-25 DIAGNOSIS — I82412 Acute embolism and thrombosis of left femoral vein: Secondary | ICD-10-CM | POA: Insufficient documentation

## 2022-05-25 DIAGNOSIS — Z7901 Long term (current) use of anticoagulants: Secondary | ICD-10-CM | POA: Insufficient documentation

## 2022-05-25 DIAGNOSIS — I82502 Chronic embolism and thrombosis of unspecified deep veins of left lower extremity: Secondary | ICD-10-CM | POA: Diagnosis not present

## 2022-05-25 DIAGNOSIS — I82432 Acute embolism and thrombosis of left popliteal vein: Secondary | ICD-10-CM | POA: Diagnosis not present

## 2022-05-25 DIAGNOSIS — I96 Gangrene, not elsewhere classified: Secondary | ICD-10-CM | POA: Diagnosis not present

## 2022-05-25 DIAGNOSIS — I75021 Atheroembolism of right lower extremity: Secondary | ICD-10-CM | POA: Diagnosis not present

## 2022-05-25 LAB — CBC WITH DIFFERENTIAL (CANCER CENTER ONLY)
Abs Immature Granulocytes: 0.02 10*3/uL (ref 0.00–0.07)
Basophils Absolute: 0.1 10*3/uL (ref 0.0–0.1)
Basophils Relative: 1 %
Eosinophils Absolute: 0.2 10*3/uL (ref 0.0–0.5)
Eosinophils Relative: 2 %
HCT: 43.5 % (ref 39.0–52.0)
Hemoglobin: 14.3 g/dL (ref 13.0–17.0)
Immature Granulocytes: 0 %
Lymphocytes Relative: 32 %
Lymphs Abs: 3 10*3/uL (ref 0.7–4.0)
MCH: 31.8 pg (ref 26.0–34.0)
MCHC: 32.9 g/dL (ref 30.0–36.0)
MCV: 96.9 fL (ref 80.0–100.0)
Monocytes Absolute: 0.7 10*3/uL (ref 0.1–1.0)
Monocytes Relative: 8 %
Neutro Abs: 5.2 10*3/uL (ref 1.7–7.7)
Neutrophils Relative %: 57 %
Platelet Count: 218 10*3/uL (ref 150–400)
RBC: 4.49 MIL/uL (ref 4.22–5.81)
RDW: 13 % (ref 11.5–15.5)
WBC Count: 9.2 10*3/uL (ref 4.0–10.5)
nRBC: 0 % (ref 0.0–0.2)

## 2022-05-25 LAB — CMP (CANCER CENTER ONLY)
ALT: 21 U/L (ref 0–44)
AST: 21 U/L (ref 15–41)
Albumin: 4.3 g/dL (ref 3.5–5.0)
Alkaline Phosphatase: 88 U/L (ref 38–126)
Anion gap: 9 (ref 5–15)
BUN: 23 mg/dL (ref 8–23)
CO2: 26 mmol/L (ref 22–32)
Calcium: 9.3 mg/dL (ref 8.9–10.3)
Chloride: 105 mmol/L (ref 98–111)
Creatinine: 0.98 mg/dL (ref 0.61–1.24)
GFR, Estimated: >60 mL/min (ref >60)
Glucose, Bld: 95 mg/dL (ref 70–99)
Potassium: 3.8 mmol/L (ref 3.5–5.1)
Sodium: 140 mmol/L (ref 135–145)
Total Bilirubin: 1.3 mg/dL — ABNORMAL HIGH (ref 0.3–1.2)
Total Protein: 6.9 g/dL (ref 6.5–8.1)

## 2022-05-25 LAB — PROTIME-INR
INR: 1.2 (ref 0.8–1.2)
Prothrombin Time: 15 seconds (ref 11.4–15.2)

## 2022-05-25 LAB — D-DIMER, QUANTITATIVE: D-Dimer, Quant: <0.27 ug/mL-FEU (ref 0.00–0.50)

## 2022-05-25 NOTE — Progress Notes (Signed)
Hematology and Oncology Follow Up Visit  Makaio Mach Sr. [id number] 1951/05/18 71 y.o. 05/25/2022   Principle Diagnosis:  Thrombus of left femoral vein down to left popliteal vein --idiopathic   Current Therapy:        Pradaxa 150 mg po BID -- started on 05/16/2022   Interim History:  Mr. Basista is here today for follow-up. He was able to finally start his Pradaxa on 05/16/2022.  He is tolerating this nicely so far. No blood loss, bruising or petechiae.  He is taking PO BID as prescribed.  No pain or swelling in his extremities at this time.  He continue to use the hyperbaric oxygen chamber and states that his right toes are looking so much better. He is also seeing wound care.  No numbness or tingling at this time.  No falls or syncope. He continue to play tennis regularly.  Appetite and hydration have been good. Weight is stable at 173 lbs.   ECOG Performance Status: 1 - Symptomatic but completely ambulatory  Medications:  Allergies as of 05/25/2022       Reactions   Penicillins Hives, Rash   Severe Rash   Penicillin V Potassium Other (See Comments)        Medication List        Accurate as of May 25, 2022  2:14 PM. If you have any questions, ask your nurse or doctor.          atorvastatin 10 MG tablet Commonly known as: LIPITOR Take 1 tablet (10 mg total) by mouth daily.   cholecalciferol 25 MCG (1000 UNIT) tablet Commonly known as: VITAMIN D3 Take 1,000 Units by mouth daily.   dabigatran 150 MG Caps capsule Commonly known as: PRADAXA Take 1 capsule (150 mg total) by mouth 2 (two) times daily.   fluoruracil 1 % cream Commonly known as: FLUOROPLEX 1 application to affected area   gabapentin 100 MG capsule Commonly known as: NEURONTIN Take 200 mg by mouth daily.   hydrochlorothiazide 12.5 MG capsule Commonly known as: MICROZIDE Take 1 capsule (12.5 mg total) by mouth daily.   HYDROcodone-acetaminophen 5-325 MG tablet Commonly known as:  NORCO/VICODIN Take 1 tablet by mouth every 6 (six) hours as needed.   hydrocortisone cream 1 % Apply 1 application topically 2 (two) times daily.   losartan 50 MG tablet Commonly known as: COZAAR Take 1 tablet (50 mg total) by mouth daily.   naproxen sodium 220 MG tablet Commonly known as: ALEVE Take 220 mg by mouth as needed. Takes before playing tennis.   nitroGLYCERIN 2 % ointment Commonly known as: NITROGLYN Apply 0.5 inches topically 4 (four) times daily.   oxyCODONE-acetaminophen 7.5-325 MG tablet Commonly known as: Percocet Take 1 tablet by mouth every 4 (four) hours as needed for severe pain.   Turmeric 500 MG Caps 1 capsule        Allergies:  Allergies  Allergen Reactions   Penicillins Hives and Rash    Severe Rash    Penicillin V Potassium Other (See Comments)    Past Medical History, Surgical history, Social history, and Family History were reviewed and updated.  Review of Systems: All other 10 point review of systems is negative.   Physical Exam:  height is '5\' 11"'$  (1.803 m) and weight is 173 lb 12.8 oz (78.8 kg). His blood pressure is 125/81 and his pulse is 81. His respiration is 17 and oxygen saturation is 99%.   Wt Readings from Last 3 Encounters:  05/25/22 173  lb 12.8 oz (78.8 kg)  05/11/22 178 lb 9.6 oz (81 kg)  05/10/22 178 lb (80.7 kg)    Ocular: Sclerae unicteric, pupils equal, round and reactive to light Ear-nose-throat: Oropharynx clear, dentition fair Lymphatic: No cervical or supraclavicular adenopathy Lungs no rales or rhonchi, good excursion bilaterally Heart regular rate and rhythm, no murmur appreciated Abd soft, nontender, positive bowel sounds MSK no focal spinal tenderness, no joint edema Neuro: non-focal, well-oriented, appropriate affect Breasts: Deferred   Lab Results  Component Value Date   WBC 9.4 05/10/2022   HGB 13.6 05/10/2022   HCT 42.1 05/10/2022   MCV 97.9 05/10/2022   PLT 316 05/10/2022   No results found  for: "FERRITIN", "IRON", "TIBC", "UIBC", "IRONPCTSAT" Lab Results  Component Value Date   RBC 4.30 05/10/2022   No results found for: "KPAFRELGTCHN", "LAMBDASER", "KAPLAMBRATIO" No results found for: "IGGSERUM", "IGA", "IGMSERUM" No results found for: "TOTALPROTELP", "ALBUMINELP", "A1GS", "A2GS", "BETS", "BETA2SER", "GAMS", "MSPIKE", "SPEI"   Chemistry      Component Value Date/Time   NA 139 05/10/2022 1331   K 4.4 05/10/2022 1331   CL 102 05/10/2022 1331   CO2 29 05/10/2022 1331   BUN 21 05/10/2022 1331   CREATININE 0.89 05/10/2022 1331      Component Value Date/Time   CALCIUM 9.4 05/10/2022 1331   ALKPHOS 79 05/10/2022 1331   AST 24 05/10/2022 1331   ALT 19 05/10/2022 1331   BILITOT 0.8 05/10/2022 1331       Impression and Plan: Mr. Rochin is a very pleasant 71 yo caucasian gentleman with idiopathic left lower extremity DVT.  He is doing well so far on Pradaxa. He will continue his same regimen.  We will plan to repeat US at follow-up the first week of October.   Lottie Dawson, NP 7/13/20232:14 PM

## 2022-05-25 NOTE — Progress Notes (Signed)
  Subjective:  Patient ID: Raymond [name]., male    DOB: [birth year],   MRN: [id number]  Chief Complaint  Patient presents with   Wound Check    Wound check right foot    71 y.o. male presents for follow-up of blue toes on the right foot. Relates he is doing much better and toes have continues to improve. Has continued hyperbaric oxygen therapy with daughter.  Relates pain is improved and has been able to start back into regular activities.  Denies any other pedal complaints. Denies n/v/f/c.   Past Medical History:  Diagnosis Date   DVT (deep venous thrombosis) (HCC)    GERD (gastroesophageal reflux disease)    Hyperlipidemia    Hypertension    Leg DVT (deep venous thromboembolism), chronic, left ([place]) 12/12/2021    Objective:  Physical Exam: Vascular: DP/PT pulses 2/4 bilateral. CFT <3 seconds. Normal hair growth on digits. No edema.  Skin. No lacerations or abrasions bilateral feet. Duskiness of digits has resolved. Necrotic hyperkeratotic tissue was removed and underlying digits appears healthy and nice healthy redness to them. There is a very small open area to the medial distal second digit with granular tissue  No maceration noted. Tenderness improved. Right second digit with some underlying darkness and eccymosis.  Musculoskeletal: MMT 5/5 bilateral lower extremities in DF, PF, Inversion and Eversion. Deceased ROM in DF of ankle joint.  Neurological: Sensation intact to light touch.        Assessment:   1. Blue toe syndrome of right lower extremity ([place])   2. Dry gangrene ([place])       Plan:  Patient was evaluated and treated and all questions answered. Blue toe syndrome of first-fourth digits with resolution of gangrene.  -No debridement today. Toes appearing healthy.  X-rays reviewed. No osseous erosions noted.  -Dressed with betadine, to the medial second toe area.  -Off-loading with surgical shoe. -No abx necessary.  -Discussed glucose control and proper  protein-rich diet.  -Discussed he has been improving well and likely hood of toe amputation has decreased.  Toe looking much healthier today. Second digit still with one small area but likely will be able to salvage toe.  -No pain medicine needed.  -Discussed if any worsening redness, pain, fever or chills to call or may need to report to the emergency room. Patient expressed understanding.  Return in 4 weeks for re-check.     Return in about 4 weeks (around 06/22/2022) for wound check.   Lorenda Peck, DPM

## 2022-05-29 NOTE — Progress Notes (Unsigned)
[place] Phone: [phone] Subjective:   [name], am serving as a scribe for Dr. Hulan Saas.  I'm seeing this patient by the request  of:  Marrian Salvage, FNP  CC: Low back pain  NOB:[id number]  04/19/2022 Seeing his hematologist in 10 days and if needed we may need to discuss bridging the Coumadin for a epidural or nerve root injection  Update 05/30/2022 [name] is a 71 y.o. male coming in with complaint of lumbar spine pain. Patient has been to ED since last visit. Patient scheduled for EMG on 8/11. Patient states that he is less active due to foot issue thus making back pain slightly worse. In past few days he has felt ok.  Reviewing patient's chart patient did have difficulty with any other details thrombosis.  Has been followed by oncology now.  He is on Pradaxa.  Patient is having a little difficulty with the cost of it.  Patient likely is healing and likely will do well and not have to have amputation of the toe.  Patient would like to start being more active but finds that his back seems to fatigue.  He was making progress initially before all of this started.       Past Medical History:  Diagnosis Date   DVT (deep venous thrombosis) (HCC)    GERD (gastroesophageal reflux disease)    Hyperlipidemia    Hypertension    Leg DVT (deep venous thromboembolism), chronic, left ([place]) 12/12/2021   Past Surgical History:  Procedure Laterality Date   EYE SURGERY     Social History   Socioeconomic History   Marital status: Widowed    Spouse name: Not on file   Number of children: Not on file   Years of education: Not on file   Highest education level: Not on file  Occupational History   Not on file  Tobacco Use   Smoking status: Never   Smokeless tobacco: Not on file  Substance and Sexual Activity   Alcohol use: Not on file   Drug use: Not on file   Sexual activity:  Not on file  Other Topics Concern   Not on file  Social History Narrative   Not on file   Social Determinants of Health   Financial Resource Strain: Not on file  Food Insecurity: Not on file  Transportation Needs: Not on file  Physical Activity: Not on file  Stress: Not on file  Social Connections: Not on file   Allergies  Allergen Reactions   Penicillins Hives and Rash    Severe Rash    Penicillin V Potassium Other (See Comments)   Family History  Problem Relation Age of Onset   Alcohol abuse Mother    Arthritis Mother    Diabetes Mother    Mental retardation Mother    Alcohol abuse Father    Hypertension Father    Cancer Sister    Depression Sister    Cancer Sister    Arthritis Maternal Grandmother    Parkinson's disease Maternal Grandfather      Current Outpatient Medications (Cardiovascular):    atorvastatin (LIPITOR) 10 MG tablet, Take 1 tablet (10 mg total) by mouth daily.   hydrochlorothiazide (MICROZIDE) 12.5 MG capsule, Take 1 capsule (12.5 mg total) by mouth daily.   losartan (COZAAR) 50 MG tablet, Take 1 tablet (50 mg total) by mouth daily.   nitroGLYCERIN (NITROGLYN) 2 % ointment, Apply  0.5 inches topically 4 (four) times daily.   Current Outpatient Medications (Analgesics):    HYDROcodone-acetaminophen (NORCO/VICODIN) 5-325 MG tablet, Take 1 tablet by mouth every 6 (six) hours as needed.   naproxen sodium (ALEVE) 220 MG tablet, Take 220 mg by mouth as needed. Takes before playing tennis.   oxyCODONE-acetaminophen (PERCOCET) 7.5-325 MG tablet, Take 1 tablet by mouth every 4 (four) hours as needed for severe pain.  Current Outpatient Medications (Hematological):    dabigatran (PRADAXA) 150 MG CAPS capsule, Take 1 capsule (150 mg total) by mouth 2 (two) times daily.  Current Outpatient Medications (Other):    cholecalciferol (VITAMIN D3) 25 MCG (1000 UNIT) tablet, Take 1,000 Units by mouth daily.   fluoruracil (FLUOROPLEX) 1 % cream, 1 application to  affected area   gabapentin (NEURONTIN) 100 MG capsule, Take 200 mg by mouth daily.   hydrocortisone cream 1 %, Apply 1 application topically 2 (two) times daily.   Turmeric 500 MG CAPS, 1 capsule   Reviewed prior external information including notes and imaging from  primary care provider As well as notes that were available from care everywhere and other healthcare systems.  Reviewed oncology notes with idea of repeating the ultrasound.  Reviewed that patient does have a follow-up with vascular in the near future as well.  Past medical history, social, surgical and family history all reviewed in electronic medical record.  No pertanent information unless stated regarding to the chief complaint.   Review of Systems:  No headache, visual changes, nausea, vomiting, diarrhea, constipation, dizziness, abdominal pain, skin rash, fevers, chills, night sweats, weight loss, swollen lymph nodes, body aches, joint swelling, chest pain, shortness of breath, mood changes. POSITIVE muscle aches, fatigue  Objective  Blood pressure 118/80, pulse 75, height '5\' 11"'$  (1.803 m), weight 173 lb (78.5 kg), SpO2 96 %.   General: No apparent distress alert and oriented x3 mood and affect normal, dressed appropriately.  HEENT: Pupils equal, extraocular movements intact  Respiratory: Patient's speak in full sentences and does not appear short of breath  Cardiovascular: No lower extremity edema, non tender, no erythema  Patient's low back does have some loss of lordosis.  Some tenderness to palpation in the paraspinal musculature.  Patient does have tightness with FABER test.  With seems to be neurovascularly intact at the moment with good pulses.  Patient does have the third toe wrapped but no significant abnormality seen with any type of discoloration of the toes today.    Impression and Recommendations:    The above documentation has been reviewed and is accurate and complete Lyndal Pulley, DO

## 2022-05-30 ENCOUNTER — Ambulatory Visit (INDEPENDENT_AMBULATORY_CARE_PROVIDER_SITE_OTHER): Payer: Medicare Other | Admitting: Family Medicine

## 2022-05-30 VITALS — BP 118/80 | HR 75 | Ht 71.0 in | Wt 173.0 lb

## 2022-05-30 DIAGNOSIS — I743 Embolism and thrombosis of arteries of the lower extremities: Secondary | ICD-10-CM | POA: Diagnosis not present

## 2022-05-30 DIAGNOSIS — M545 Low back pain, unspecified: Secondary | ICD-10-CM

## 2022-05-30 DIAGNOSIS — I998 Other disorder of circulatory system: Secondary | ICD-10-CM | POA: Diagnosis not present

## 2022-05-30 DIAGNOSIS — M79604 Pain in right leg: Secondary | ICD-10-CM | POA: Diagnosis not present

## 2022-05-30 DIAGNOSIS — I82502 Chronic embolism and thrombosis of unspecified deep veins of left lower extremity: Secondary | ICD-10-CM | POA: Diagnosis not present

## 2022-05-30 DIAGNOSIS — M5136 Other intervertebral disc degeneration, lumbar region: Secondary | ICD-10-CM | POA: Diagnosis not present

## 2022-05-30 NOTE — Patient Instructions (Signed)
PT [facility] labs including autoimmune panel, B12, Folate with your PCP Do get nerve conduction study Have appt in 7-8 weeks

## 2022-05-30 NOTE — Assessment & Plan Note (Signed)
Patient does have degenerative disc disease lumbar spine.  We discussed the possibility of having elevated injections that patient wanted.  Unfortunately secondary to him being on a blood thinner and this could be more complicated at the moment.  I would like him to restart with formal physical therapy.  He can continue to get continue.  Discussed with patient that I do feel that further laboratory work-up is necessary potentially for the vascular pathology.  Patient is scheduled to see the vascular surgeon as well as see his primary care physician in the near future.  I do think an autoimmune work-up, B12, PSA and ESR are also necessary at this time to further evaluate for anything that would be contributing.  Follow-up with me otherwise in 6 to 8 weeks to see how patient is doing with the conservative therapy for his back.  Patient is scheduled for nerve conduction study which will tell us to see if there is more of a peripheral neuropathy versus the possibility of lumbar radiculopathy that could be contributing to some of his leg pain in addition to the vascular compromise.

## 2022-05-30 NOTE — Assessment & Plan Note (Signed)
Continues to see oncology, is going to be following up with vascular in the near future as well.  Patient is concerned for the potential cost of the anticoagulant medication and due to having 2 DVTs relatively quickly would patient also be a possible candidate for a filter.

## 2022-05-30 NOTE — Assessment & Plan Note (Signed)
Seems to be improving at this time.  We will continue to monitor.  Still Tylenol would be better anti-inflammatories with patient being on the anticoagulation.

## 2022-06-01 ENCOUNTER — Encounter: Payer: Self-pay | Admitting: Family

## 2022-06-01 ENCOUNTER — Ambulatory Visit (INDEPENDENT_AMBULATORY_CARE_PROVIDER_SITE_OTHER): Payer: Medicare Other

## 2022-06-01 ENCOUNTER — Other Ambulatory Visit: Payer: Self-pay | Admitting: Family

## 2022-06-01 ENCOUNTER — Ambulatory Visit (INDEPENDENT_AMBULATORY_CARE_PROVIDER_SITE_OTHER): Payer: Medicare Other | Admitting: Family

## 2022-06-01 VITALS — BP 122/82 | HR 78 | Ht 72.0 in | Wt 173.8 lb

## 2022-06-01 VITALS — BP 122/82 | HR 78 | Temp 98.3°F | Resp 16 | Ht 72.0 in | Wt 173.8 lb

## 2022-06-01 DIAGNOSIS — N400 Enlarged prostate without lower urinary tract symptoms: Secondary | ICD-10-CM | POA: Diagnosis not present

## 2022-06-01 DIAGNOSIS — M791 Myalgia, unspecified site: Secondary | ICD-10-CM

## 2022-06-01 DIAGNOSIS — E538 Deficiency of other specified B group vitamins: Secondary | ICD-10-CM | POA: Diagnosis not present

## 2022-06-01 DIAGNOSIS — Z0000 Encounter for general adult medical examination without abnormal findings: Secondary | ICD-10-CM

## 2022-06-01 DIAGNOSIS — I10 Essential (primary) hypertension: Secondary | ICD-10-CM

## 2022-06-01 DIAGNOSIS — Z1211 Encounter for screening for malignant neoplasm of colon: Secondary | ICD-10-CM | POA: Diagnosis not present

## 2022-06-01 DIAGNOSIS — E785 Hyperlipidemia, unspecified: Secondary | ICD-10-CM | POA: Diagnosis not present

## 2022-06-01 DIAGNOSIS — E782 Mixed hyperlipidemia: Secondary | ICD-10-CM | POA: Diagnosis not present

## 2022-06-01 DIAGNOSIS — R351 Nocturia: Secondary | ICD-10-CM | POA: Diagnosis not present

## 2022-06-01 DIAGNOSIS — Z125 Encounter for screening for malignant neoplasm of prostate: Secondary | ICD-10-CM | POA: Diagnosis not present

## 2022-06-01 NOTE — Progress Notes (Addendum)
Subjective:   Raymond Route Sr. is a 71 y.o. male who presents for an Initial Medicare Annual Wellness Visit.  Review of Systems     Cardiac Risk Factors include: advanced age (>65mn, >>65women);hypertension;dyslipidemia     Objective:    Today's Vitals   06/01/22 1419 06/01/22 1431  BP: 122/82   Pulse: 78   Resp: 16   Temp: 98.3 F (36.8 C)   SpO2: 95%   Weight: 173 lb 12.8 oz (78.8 kg)   Height: 6' (1.829 m)   PainSc:  0-No pain   Body mass index is 23.57 kg/m.     06/01/2022    2:25 PM 05/25/2022    2:06 PM 05/10/2022    1:54 PM 05/06/2022    8:46 AM 04/28/2022    2:53 PM 04/27/2022    4:06 PM 03/03/2022    3:01 PM  Advanced Directives  Does Patient Have a Medical Advance Directive? No No No No No No No  Does patient want to make changes to medical advance directive? No - Patient declined  No - Patient declined  No - Patient declined  No - Patient declined  Would patient like information on creating a medical advance directive? No - Patient declined   No - Patient declined       Current Medications (verified) Outpatient Encounter Medications as of 06/01/2022  Medication Sig   atorvastatin (LIPITOR) 10 MG tablet Take 1 tablet (10 mg total) by mouth daily.   cholecalciferol (VITAMIN D3) 25 MCG (1000 UNIT) tablet Take 1,000 Units by mouth daily.   dabigatran (PRADAXA) 150 MG CAPS capsule Take 1 capsule (150 mg total) by mouth 2 (two) times daily.   fluoruracil (FLUOROPLEX) 1 % cream 1 application to affected area   gabapentin (NEURONTIN) 100 MG capsule Take 200 mg by mouth daily.   hydrochlorothiazide (MICROZIDE) 12.5 MG capsule TAKE ONE CAPSULE BY MOUTH ONE TIME DAILY   hydrocortisone cream 1 % Apply 1 application topically 2 (two) times daily.   losartan (COZAAR) 50 MG tablet TAKE ONE TABLET BY MOUTH ONE TIME DAILY   naproxen sodium (ALEVE) 220 MG tablet Take 220 mg by mouth as needed. Takes before playing tennis.   nitroGLYCERIN (NITROGLYN) 2 % ointment  Apply 0.5 inches topically 4 (four) times daily.   Turmeric 500 MG CAPS 1 capsule   [DISCONTINUED] hydrochlorothiazide (MICROZIDE) 12.5 MG capsule Take 1 capsule (12.5 mg total) by mouth daily.   [DISCONTINUED] HYDROcodone-acetaminophen (NORCO/VICODIN) 5-325 MG tablet Take 1 tablet by mouth every 6 (six) hours as needed.   [DISCONTINUED] losartan (COZAAR) 50 MG tablet Take 1 tablet (50 mg total) by mouth daily.   [DISCONTINUED] oxyCODONE-acetaminophen (PERCOCET) 7.5-325 MG tablet Take 1 tablet by mouth every 4 (four) hours as needed for severe pain.   No facility-administered encounter medications on file as of 06/01/2022.    Allergies (verified) Penicillins and Penicillin v potassium   History: Past Medical History:  Diagnosis Date   DVT (deep venous thrombosis) (HCC)    GERD (gastroesophageal reflux disease)    Hyperlipidemia    Hypertension    Leg DVT (deep venous thromboembolism), chronic, left ([place] 12/12/2021   Past Surgical History:  Procedure Laterality Date   EYE SURGERY     Family History  Problem Relation Age of Onset   Alcohol abuse Mother    Arthritis Mother    Diabetes Mother    Mental retardation Mother    Alcohol abuse Father    Hypertension Father  Cancer Sister    Depression Sister    Cancer Sister    Arthritis Maternal Grandmother    Parkinson's disease Maternal Grandfather    Social History   Socioeconomic History   Marital status: Widowed    Spouse name: Not on file   Number of children: Not on file   Years of education: Not on file   Highest education level: Not on file  Occupational History   Not on file  Tobacco Use   Smoking status: Never   Smokeless tobacco: Not on file  Substance and Sexual Activity   Alcohol use: Not on file   Drug use: Not on file   Sexual activity: Not on file  Other Topics Concern   Not on file  Social History Narrative   Not on file   Social Determinants of Health   Financial Resource Strain: Not on file   Food Insecurity: Not on file  Transportation Needs: Not on file  Physical Activity: Not on file  Stress: Not on file  Social Connections: Not on file    Tobacco Counseling Counseling given: Not Answered   Clinical Intake:  Pre-visit preparation completed: Yes  Pain : No/denies pain Pain Score: 0-No pain     BMI - recorded: 23.57 Nutritional Status: BMI of 19-24  Normal Nutritional Risks: None Diabetes: No  How often do you need to have someone help you when you read instructions, pamphlets, or other written materials from your doctor or pharmacy?: 1 - Never  Diabetic?no  Interpreter Needed?: No  Information entered by :: [employer]    06/01/2022    2:33 PM  In your present state of health, do you have any difficulty performing the following activities:  Hearing? 1  Vision? 1  Difficulty concentrating or making decisions? 0  Walking or climbing stairs? 0  Dressing or bathing? 0  Doing errands, shopping? 0  Preparing Food and eating ? N  Using the Toilet? N  In the past six months, have you accidently leaked urine? N  Do you have problems with loss of bowel control? N  Managing your Medications? N  Managing your Finances? N  Housekeeping or managing your Housekeeping? N    Patient Care Team: Marrian Salvage, FNP as PCP - General (Internal Medicine)  Indicate any recent Medical Services you may have received from other than [facility] providers in the past year (date may be approximate).     Assessment:   This is a routine wellness examination for Raymond [name].  Hearing/Vision screen No results found.  Dietary issues and exercise activities discussed: Current Exercise Habits: Home exercise routine, Type of exercise: Other - see comments;walking (tennis), Time (Minutes): 60, Frequency (Times/Week): 7, Weekly Exercise (Minutes/Week): 420, Intensity: Mild, Exercise limited by: None identified   Goals Addressed   None     Depression Screen    06/01/2022    2:28 PM 01/06/2022   11:39 AM  PHQ 2/9 Scores  PHQ - 2 Score 2 4  PHQ- 9 Score 9 11    Fall Risk    06/01/2022    2:27 PM 01/06/2022   10:46 AM 11/11/2021    1:48 PM 08/30/2021    3:07 PM  Fall Risk   Falls in the past year? 1 0 0 0  Number falls in past yr: 0  0 0  Injury with Fall? 1  0 0  Risk for fall due to : Impaired balance/gait  No Fall Risks  No Fall Risks  Follow up Falls evaluation completed  Falls evaluation completed Falls evaluation completed    [place]:  Any stairs in or around the home? Yes  If so, are there any without handrails? No  Home free of loose throw rugs in walkways, pet beds, electrical cords, etc? Yes  Adequate lighting in your home to reduce risk of falls? Yes   ASSISTIVE DEVICES UTILIZED TO PREVENT FALLS:  Life alert? No  Use of a cane, walker or w/c? No  Grab bars in the bathroom? No  Shower chair or bench in shower? Yes  Elevated toilet seat or a handicapped toilet? No   TIMED UP AND GO:  Was the test performed? Yes .  Length of time to ambulate 10 feet: 9 sec.   Gait steady and fast without use of assistive device  Cognitive Function:        06/01/2022    2:39 PM  6CIT Screen  What Year? 0 points  What month? 0 points  What time? 0 points  Count back from 20 0 points  Months in reverse 0 points  Repeat phrase 0 points  Total Score 0 points    Immunizations Immunization History  Administered Date(s) Administered   Fluad Quad(high Dose 65+) 08/30/2021   Influenza, High Dose Seasonal PF 08/18/2019   PFIZER(Purple Top)SARS-COV-2 Vaccination 12/19/2019, 01/21/2020, 10/24/2020, 02/28/2021   Pneumococcal Conjugate-13 01/23/2017   Pneumococcal Polysaccharide-23 05/22/2018   Tdap 02/17/2021    TDAP status: Up to date  Flu Vaccine status: Up to date  Pneumococcal vaccine status: Up to date  Covid-19 vaccine status: Information provided on how to  obtain vaccines.   Qualifies for Shingles Vaccine? Yes   Zostavax completed No   Shingrix Completed?: Yes  Screening Tests Health Maintenance  Topic Date Due   Hepatitis C Screening  Never done   Zoster Vaccines- Shingrix (1 of 2) Never done   COVID-19 Vaccine (5 - Pfizer series) 04/25/2021   COLONOSCOPY (Pts 45-47yr Insurance coverage will need to be confirmed)  08/30/2022 (Originally 03/18/1996)   INFLUENZA VACCINE  06/13/2022   TETANUS/TDAP  02/18/2031   Pneumonia Vaccine 71 Years old  Completed   HPV VACCINES  Aged Out    Health Maintenance  Health Maintenance Due  Topic Date Due   Hepatitis C Screening  Never done   Zoster Vaccines- Shingrix (1 of 2) Never done   COVID-19 Vaccine (5 - Pfizer series) 04/25/2021    Colorectal cancer screening: Referral to GI placed declined. Pt aware the office will call re: appt.  Lung Cancer Screening: (Low Dose CT Chest recommended if Age 73-80years, 30 pack-year currently smoking OR have quit w/in 15years.) does not qualify.   Lung Cancer Screening Referral: n/a  Additional Screening:  Hepatitis C Screening: does qualify; Completed not yet  Vision Screening: Recommended annual ophthalmology exams for early detection of glaucoma and other disorders of the eye. Is the patient up to date with their annual eye exam?  Yes  Who is the provider or what is the name of the office in which the patient attends annual eye exams? N/A If pt is not established with a provider, would they like to be referred to a provider to establish care? No .   Dental Screening: Recommended annual dental exams for proper oral hygiene  Community Resource Referral / Chronic Care Management: CRR required this visit?  No   CCM required this visit?  No  Plan:     I have personally reviewed and noted the following in the patient's chart:   Medical and social history Use of alcohol, tobacco or illicit drugs  Current medications and supplements  including opioid prescriptions. Patient is not currently taking opioid prescriptions. Functional ability and status Nutritional status Physical activity Advanced directives List of other physicians Hospitalizations, surgeries, and ER visits in previous 12 months Vitals Screenings to include cognitive, depression, and falls Referrals and appointments  In addition, I have reviewed and discussed with patient certain preventive protocols, quality metrics, and best practice recommendations. A written personalized care plan for preventive services as well as general preventive health recommendations were provided to patient.     Duard Brady Aloysius Heinle, [place]   06/01/2022   Nurse Notes: none   Medical screening examination/treatment/procedure(s) were performed by non-physician practitioner and as supervising provider I was immediately available for consultation/collaboration.  I agree with above. Marrian Salvage, FNP

## 2022-06-01 NOTE — Progress Notes (Signed)
Raymond Pasha Sr. is a 71 y.o. male with the following history as recorded in Epic[facility]:  Patient Active Problem List   Diagnosis Date Noted   Pain of right lower extremity due to ischemia 05/01/2022   Leg DVT (deep venous thromboembolism), chronic, left (HCC) 12/12/2021   Mixed hyperlipidemia 11/21/2021   Low libido 11/21/2021   Low back pain 11/21/2021   Loss of appetite 11/21/2021   Hypertensive retinopathy 11/21/2021   Essential hypertension 11/21/2021   Chronic pain 11/21/2021   Enlarged prostate 06/20/2021   Degenerative disc disease, cervical 12/03/2020   Degenerative lumbar disc 09/27/2020   Degenerative arthritis of left knee 09/27/2020    Current Outpatient Medications  Medication Sig Dispense Refill   atorvastatin (LIPITOR) 10 MG tablet Take 1 tablet (10 mg total) by mouth daily. 90 tablet 3   cholecalciferol (VITAMIN D3) 25 MCG (1000 UNIT) tablet Take 1,000 Units by mouth daily.     dabigatran (PRADAXA) 150 MG CAPS capsule Take 1 capsule (150 mg total) by mouth 2 (two) times daily. 60 capsule 6   fluoruracil (FLUOROPLEX) 1 % cream 1 application to affected area     gabapentin (NEURONTIN) 100 MG capsule Take 200 mg by mouth daily.     hydrochlorothiazide (MICROZIDE) 12.5 MG capsule TAKE ONE CAPSULE BY MOUTH ONE TIME DAILY 90 capsule 1   hydrocortisone cream 1 % Apply 1 application topically 2 (two) times daily.     losartan (COZAAR) 50 MG tablet TAKE ONE TABLET BY MOUTH ONE TIME DAILY 90 tablet 1   naproxen sodium (ALEVE) 220 MG tablet Take 220 mg by mouth as needed. Takes before playing tennis.     nitroGLYCERIN (NITROGLYN) 2 % ointment Apply 0.5 inches topically 4 (four) times daily. 30 g 0   Turmeric 500 MG CAPS 1 capsule     No current facility-administered medications for this visit.    Allergies: Penicillins and Penicillin v potassium  Past Medical History:  Diagnosis Date   DVT (deep venous thrombosis) (HCC)    GERD (gastroesophageal reflux disease)     Hyperlipidemia    Hypertension    Leg DVT (deep venous thromboembolism), chronic, left ([place]) 12/12/2021    Past Surgical History:  Procedure Laterality Date   EYE SURGERY      Family History  Problem Relation Age of Onset   Alcohol abuse Mother    Arthritis Mother    Diabetes Mother    Mental retardation Mother    Alcohol abuse Father    Hypertension Father    Cancer Sister    Depression Sister    Cancer Sister    Arthritis Maternal Grandmother    Parkinson's disease Maternal Grandfather     Social History   Tobacco Use   Smoking status: Never   Smokeless tobacco: Not on file  Substance Use Topics   Alcohol use: Not on file    Subjective:   Had in person AWV earlier today; has had very complicated year- lost his wife in September 2022; 1st blood clot in December 2022; could not afford Eliquis and was switched to Coumadin; limited to response and developed second blood clot; there were also concerns about vascular issues and possible need for amputation of toes ( right foot); Thankfully, has been responding to hyperbaric treatments and will not require amputation; Will be meeting with vascular next week to discuss possible IVC filter;   Saw sports medicine provider this week regarding chronic low back pain- will be having nerve conduction study but  labs were recommended; asking to have those drawn;  Weight is down again- admits has not been eating as well as he should due to stress with his physical health; has not been able to start grief counseling;      Objective:  Vitals:   06/01/22 1501  BP: 122/82  Pulse: 78  Weight: 173 lb 12.8 oz (78.8 kg)  Height: 6' (1.829 m)    General: Well developed, well nourished, in no acute distress  Skin : Warm and dry.  Head: Normocephalic and atraumatic  Eyes: Sclera and conjunctiva clear; pupils round and reactive to light; extraocular movements intact  Ears: External normal; canals clear; tympanic membranes normal   Oropharynx: Pink, supple. No suspicious lesions  Neck: Supple without thyromegaly, adenopathy  Lungs: Respirations unlabored; clear to auscultation bilaterally without wheeze, rales, rhonchi  CVS exam: normal rate and regular rhythm.  Neurologic: Alert and oriented; speech intact; face symmetrical; moves all extremities well; CNII-XII intact without focal deficit   Assessment:  1. Essential hypertension   2. Mixed hyperlipidemia   3. Myalgia   4. Enlarged prostate   5. Low vitamin B12 level     Plan:  Stable; continue same medication; Stable; continue same medication;  Check ANA, sed rate, RF, B12 and PSA today; follow up to be determined; stressed need to work on trying to drink Boost or Ensure to help with calorie intake and gain weight; patient in agreement and will try to start working this into his diet;   Time spent 30 minutes reviewing health issues/ discussing treatment plan;  Follow up to be determined;   No follow-ups on file.  No orders of the defined types were placed in this encounter.   Requested Prescriptions    No prescriptions requested or ordered in this encounter

## 2022-06-01 NOTE — Patient Instructions (Signed)
Raymond [name] , Thank you for taking time to come for your Medicare Wellness Visit. I appreciate your ongoing commitment to your health goals. Please review the following plan we discussed and let me know if I can assist you in the future.   Screening recommendations/referrals: Colonoscopy: declined Recommended yearly ophthalmology/optometry visit for glaucoma screening and checkup Recommended yearly dental visit for hygiene and checkup  Vaccinations: Influenza vaccine: up to date Pneumococcal vaccine: up to date Tdap vaccine: up to date Shingles vaccine: up to date   Covid-19: Due-May obtain vaccine at your local pharmacy.   Advanced directives: no  Conditions/risks identified: see problem list   Next appointment: Follow up in one year for your annual wellness visit.   Preventive Care 2 Years and Older, Male Preventive care refers to lifestyle choices and visits with your health care provider that can promote health and wellness. What does preventive care include? A yearly physical exam. This is also called an annual well check. Dental exams once or twice a year. Routine eye exams. Ask your health care provider how often you should have your eyes checked. Personal lifestyle choices, including: Daily care of your teeth and gums. Regular physical activity. Eating a healthy diet. Avoiding tobacco and drug use. Limiting alcohol use. Practicing safe sex. Taking low doses of aspirin every day. Taking vitamin and mineral supplements as recommended by your health care provider. What happens during an annual well check? The services and screenings done by your health care provider during your annual well check will depend on your age, overall health, lifestyle risk factors, and family history of disease. Counseling  Your health care provider may ask you questions about your: Alcohol use. Tobacco use. Drug use. Emotional well-being. Home and relationship well-being. Sexual  activity. Eating habits. History of falls. Memory and ability to understand (cognition). Work and work Statistician. Screening  You may have the following tests or measurements: Height, weight, and BMI. Blood pressure. Lipid and cholesterol levels. These may be checked every 5 years, or more frequently if you are over 30 years old. Skin check. Lung cancer screening. You may have this screening every year starting at age 81 if you have a 30-pack-year history of smoking and currently smoke or have quit within the past 15 years. Fecal occult blood test (FOBT) of the stool. You may have this test every year starting at age 33. Flexible sigmoidoscopy or colonoscopy. You may have a sigmoidoscopy every 5 years or a colonoscopy every 10 years starting at age 43. Prostate cancer screening. Recommendations will vary depending on your family history and other risks. Hepatitis C blood test. Hepatitis B blood test. Sexually transmitted disease (STD) testing. Diabetes screening. This is done by checking your blood sugar (glucose) after you have not eaten for a while (fasting). You may have this done every 1-3 years. Abdominal aortic aneurysm (AAA) screening. You may need this if you are a current or former smoker. Osteoporosis. You may be screened starting at age 22 if you are at high risk. Talk with your health care provider about your test results, treatment options, and if necessary, the need for more tests. Vaccines  Your health care provider may recommend certain vaccines, such as: Influenza vaccine. This is recommended every year. Tetanus, diphtheria, and acellular pertussis (Tdap, Td) vaccine. You may need a Td booster every 10 years. Zoster vaccine. You may need this after age 9. Pneumococcal 13-valent conjugate (PCV13) vaccine. One dose is recommended after age 45. Pneumococcal polysaccharide (PPSV23) vaccine. One  dose is recommended after age 15. Talk to your health care provider about which  screenings and vaccines you need and how often you need them. This information is not intended to replace advice given to you by your health care provider. Make sure you discuss any questions you have with your health care provider. Document Released: 11/26/2015 Document Revised: 07/19/2016 Document Reviewed: 08/31/2015 Elsevier Interactive Patient Education  2017 [place] Prevention in the Home Falls can cause injuries. They can happen to people of all ages. There are many things you can do to make your home safe and to help prevent falls. What can I do on the outside of my home? Regularly fix the edges of walkways and driveways and fix any cracks. Remove anything that might make you trip as you walk through a door, such as a raised step or threshold. Trim any bushes or trees on the path to your home. Use bright outdoor lighting. Clear any walking paths of anything that might make someone trip, such as rocks or tools. Regularly check to see if handrails are loose or broken. Make sure that both sides of any steps have handrails. Any raised decks and porches should have guardrails on the edges. Have any leaves, snow, or ice cleared regularly. Use sand or salt on walking paths during winter. Clean up any spills in your garage right away. This includes oil or grease spills. What can I do in the bathroom? Use night lights. Install grab bars by the toilet and in the tub and shower. Do not use towel bars as grab bars. Use non-skid mats or decals in the tub or shower. If you need to sit down in the shower, use a plastic, non-slip stool. Keep the floor dry. Clean up any water that spills on the floor as soon as it happens. Remove soap buildup in the tub or shower regularly. Attach bath mats securely with double-sided non-slip rug tape. Do not have throw rugs and other things on the floor that can make you trip. What can I do in the bedroom? Use night lights. Make sure that you have a  light by your bed that is easy to reach. Do not use any sheets or blankets that are too big for your bed. They should not hang down onto the floor. Have a firm chair that has side arms. You can use this for support while you get dressed. Do not have throw rugs and other things on the floor that can make you trip. What can I do in the kitchen? Clean up any spills right away. Avoid walking on wet floors. Keep items that you use a lot in easy-to-reach places. If you need to reach something above you, use a strong step stool that has a grab bar. Keep electrical cords out of the way. Do not use floor polish or wax that makes floors slippery. If you must use wax, use non-skid floor wax. Do not have throw rugs and other things on the floor that can make you trip. What can I do with my stairs? Do not leave any items on the stairs. Make sure that there are handrails on both sides of the stairs and use them. Fix handrails that are broken or loose. Make sure that handrails are as long as the stairways. Check any carpeting to make sure that it is firmly attached to the stairs. Fix any carpet that is loose or worn. Avoid having throw rugs at the top or bottom of the  stairs. If you do have throw rugs, attach them to the floor with carpet tape. Make sure that you have a light switch at the top of the stairs and the bottom of the stairs. If you do not have them, ask someone to add them for you. What else can I do to help prevent falls? Wear shoes that: Do not have high heels. Have rubber bottoms. Are comfortable and fit you well. Are closed at the toe. Do not wear sandals. If you use a stepladder: Make sure that it is fully opened. Do not climb a closed stepladder. Make sure that both sides of the stepladder are locked into place. Ask someone to hold it for you, if possible. Clearly mark and make sure that you can see: Any grab bars or handrails. First and last steps. Where the edge of each step  is. Use tools that help you move around (mobility aids) if they are needed. These include: Canes. Walkers. Scooters. Crutches. Turn on the lights when you go into a dark area. Replace any light bulbs as soon as they burn out. Set up your furniture so you have a clear path. Avoid moving your furniture around. If any of your floors are uneven, fix them. If there are any pets around you, be aware of where they are. Review your medicines with your doctor. Some medicines can make you feel dizzy. This can increase your chance of falling. Ask your doctor what other things that you can do to help prevent falls. This information is not intended to replace advice given to you by your health care provider. Make sure you discuss any questions you have with your health care provider. Document Released: 08/26/2009 Document Revised: 04/06/2016 Document Reviewed: 12/04/2014 Elsevier Interactive Patient Education  2017 [employer].

## 2022-06-01 NOTE — Addendum Note (Signed)
Addended by: Sherlene Shams on: 06/01/2022 04:46 PM   Modules accepted: Orders

## 2022-06-02 LAB — PSA: PSA: 3.64 ng/mL (ref 0.10–4.00)

## 2022-06-02 LAB — ANA: Anti Nuclear Antibody (ANA): NEGATIVE

## 2022-06-02 LAB — SEDIMENTATION RATE: Sed Rate: 11 mm/hr (ref 0–20)

## 2022-06-02 LAB — VITAMIN B12: Vitamin B-12: 308 pg/mL (ref 211–911)

## 2022-06-02 LAB — RHEUMATOID FACTOR: Rheumatoid fact SerPl-aCnc: <14 IU/mL (ref <14)

## 2022-06-04 NOTE — Progress Notes (Unsigned)
[facility] [place]  ASSESSMENT / PLAN: 71 y.o. 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  Thankfully Raymond [name] is doing much better than the last time I saw him.  His wounds are virtually healed.  Raymond [name] has been doing daily hyperbaric oxygen therapy which seems to be helping him significantly.  His recent noninvasive vascular testing that showed diminished toe pressures, which are typical in this scenario.  I do not think this is a limb threatening problem and I do not think Raymond [name] will need a toe amputation.  Raymond [name] can follow-up with me as needed for this problem.  # Chronic deep venous thrombosis left lower extremity.  I do not think his recent duplex findings represented treatment failure of Coumadin.  Clinically Raymond [name] has had no change in his left lower extremity symptoms.  His duplex is read as suggestive of possible progression, but I do not appreciate this on my personal review of the images.  There are inherent problems with evaluating progression between scans.  Raymond [name] has significant financial challenges at the present moment, and is having difficulty affording Pradaxa.  I am comfortable transitioning him back to Coumadin if his hematologist is in agreement with this plan.  I strongly recommend against placing inferior vena cava filter at this point.  I encouraged him to only obtain a follow-up scan should Raymond [name] develop new symptoms.  Raymond [name] can follow-up with me as needed for this problem  CHIEF COMPLAINT: right toe pain  HISTORY OF PRESENT ILLNESS: Raymond Vuncannon Sr. is a 71 y.o. male referred to clinic for evaluation of blue toes about the right foot. This occurred suddenly last week without provoking event. 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  Raymond [name] was seen in the [facility] emergency department for evaluation.  A CT angiogram was performed which was grossly normal.  Raymond [name] was referred for outpatient evaluation in my  clinic.  Patient is having severe pain in the right foot.  This is limiting ambulation.  Raymond [name] is not able to sleep well.  06/06/2022.  Patient returns to clinic for follow-up of atheroembolism.  Thankfully Raymond [name] is doing quite well from this standpoint.  Raymond [name] has virtually healed the tissue loss that Raymond [name] suffered to his right first and second toe.  Raymond [name] attributes much of his success to daily hyperbaric oxygen therapy which she has had access to because of his daughters personal interest in this therapy.  Raymond [name] had recent noninvasive vascular testing looking at arterial flow which was worrisome to his primary physician.  I reviewed the studies today with him and counseled him about their findings.  I do not think Raymond [name] has meaningful arterial disease in his legs.  Raymond [name] also requested a follow-up left lower extremity venous duplex to try a different type of experimental therapy.  This showed a possible progression of the known femoral vein thrombus.  His hematologist, Dr. Marin Olp transition him from Coumadin to Pradaxa.  Unfortunately Raymond [name] is having a difficult time affording his Pradaxa therapy and would like to transition back to Coumadin.  VASCULAR SURGICAL HISTORY: none  VASCULAR RISK FACTORS: Negative history of stroke / transient ischemic attack. Negative history of coronary artery disease.  Negative history of diabetes mellitus.  Negative history of smoking.  Positive history of hypertension. Negative history of chronic kidney disease.   Negative history of chronic obstructive pulmonary disease.  FUNCTIONAL STATUS: ECOG performance status: (2) Ambulatory and capable of  self care, unable to carry out work activity, up and about > 50% or waking hours Ambulatory status: Minimally ambulatory (e.g. about the home only)  Past Medical History:  Diagnosis Date   DVT (deep venous thrombosis) (HCC)    GERD (gastroesophageal reflux disease)    Hyperlipidemia    Hypertension    Leg DVT (deep venous  thromboembolism), chronic, left ([place]) 12/12/2021    Past Surgical History:  Procedure Laterality Date   EYE SURGERY      Family History  Problem Relation Age of Onset   Alcohol abuse Mother    Arthritis Mother    Diabetes Mother    Mental retardation Mother    Alcohol abuse Father    Hypertension Father    Cancer Sister    Depression Sister    Cancer Sister    Arthritis Maternal Grandmother    Parkinson's disease Maternal Grandfather     Social History   Socioeconomic History   Marital status: Widowed    Spouse name: Not on file   Number of children: Not on file   Years of education: Not on file   Highest education level: Not on file  Occupational History   Not on file  Tobacco Use   Smoking status: Never   Smokeless tobacco: Not on file  Substance and Sexual Activity   Alcohol use: Not on file   Drug use: Not on file   Sexual activity: Not on file  Other Topics Concern   Not on file  Social History Narrative   Not on file   Social Determinants of Health   Financial Resource Strain: Medium Risk (06/01/2022)   Overall Financial Resource Strain (CARDIA)    Difficulty of Paying Living Expenses: Somewhat hard  Food Insecurity: No Food Insecurity (06/01/2022)   Hunger Vital Sign    Worried About Running Out of Food in the Last Year: Never true    Ran Out of Food in the Last Year: Never true  Transportation Needs: No Transportation Needs (06/01/2022)   PRAPARE - Hydrologist (Medical): No    Lack of Transportation (Non-Medical): No  Physical Activity: Sufficiently Active (06/01/2022)   Exercise Vital Sign    Days of Exercise per Week: 7 days    Minutes of Exercise per Session: 60 min  Stress: Stress Concern Present (06/01/2022)   Day    Feeling of Stress : Rather much  Social Connections: Moderately Isolated (06/01/2022)   Social Connection and Isolation Panel  [NHANES]    Frequency of Communication with Friends and Family: More than three times a week    Frequency of Social Gatherings with Friends and Family: More than three times a week    Attends Religious Services: Never    Marine scientist or Organizations: Yes    Attends Music therapist: More than 4 times per year    Marital Status: Widowed  Intimate Partner Violence: Not At Risk (06/01/2022)   Humiliation, Afraid, Rape, and Kick questionnaire    Fear of Current or Ex-Partner: No    Emotionally Abused: No    Physically Abused: No    Sexually Abused: No    Allergies  Allergen Reactions   Penicillins Hives and Rash    Severe Rash    Penicillin V Potassium Other (See Comments)    Current Outpatient Medications  Medication Sig Dispense Refill   atorvastatin (LIPITOR) 10 MG tablet Take 1 tablet (  10 mg total) by mouth daily. 90 tablet 3   cholecalciferol (VITAMIN D3) 25 MCG (1000 UNIT) tablet Take 1,000 Units by mouth daily.     dabigatran (PRADAXA) 150 MG CAPS capsule Take 1 capsule (150 mg total) by mouth 2 (two) times daily. 60 capsule 6   fluoruracil (FLUOROPLEX) 1 % cream 1 application to affected area     gabapentin (NEURONTIN) 100 MG capsule Take 200 mg by mouth daily.     hydrochlorothiazide (MICROZIDE) 12.5 MG capsule TAKE ONE CAPSULE BY MOUTH ONE TIME DAILY 90 capsule 1   hydrocortisone cream 1 % Apply 1 application topically 2 (two) times daily.     losartan (COZAAR) 50 MG tablet TAKE ONE TABLET BY MOUTH ONE TIME DAILY 90 tablet 1   naproxen sodium (ALEVE) 220 MG tablet Take 220 mg by mouth as needed. Takes before playing tennis.     nitroGLYCERIN (NITROGLYN) 2 % ointment Apply 0.5 inches topically 4 (four) times daily. 30 g 0   Turmeric 500 MG CAPS 1 capsule     No current facility-administered medications for this visit.    PHYSICAL EXAM There were no vitals filed for this visit.   Constitutional: Well-appearing elderly man in no acute  distress. Cardiac: regular rate and rhythm.  Respiratory:  unlabored. Peripheral vascular: 2+ pedal pulses bilaterally. Extremity: 2+ edema at left foot and ankle.  No further cyanosis in either lower extremity. Skin: ischemic skin changes about the right foot have nearly resolved Lymphatic: no Stemmer's sign. no palpable lymphadenopathy.    PERTINENT LABORATORY AND RADIOLOGIC DATA  Most recent CBC    Latest Ref Rng & Units 05/25/2022    1:58 PM 05/10/2022    1:31 PM 04/28/2022    4:27 PM  CBC  WBC 4.0 - 10.5 K/uL 9.2  9.4  9.0   Hemoglobin 13.0 - 17.0 g/dL 14.3  13.6  14.8   Hematocrit 39.0 - 52.0 % 43.5  42.1  45.0   Platelets 150 - 400 K/uL 218  316  257      Most recent CMP    Latest Ref Rng & Units 05/25/2022    1:58 PM 05/10/2022    1:31 PM 04/28/2022    4:27 PM  CMP  Glucose 70 - 99 mg/dL 95  106  95   BUN 8 - 23 mg/dL '23  21  19   '$ Creatinine 0.61 - 1.24 mg/dL 0.98  0.89  0.91   Sodium 135 - 145 mmol/L 140  139  137   Potassium 3.5 - 5.1 mmol/L 3.8  4.4  3.9   Chloride 98 - 111 mmol/L 105  102  102   CO2 22 - 32 mmol/L '26  29  29   '$ Calcium 8.9 - 10.3 mg/dL 9.3  9.4  9.0   Total Protein 6.5 - 8.1 g/dL 6.9  7.3    Total Bilirubin 0.3 - 1.2 mg/dL 1.3  0.8    Alkaline Phos 38 - 126 U/L 88  79    AST 15 - 41 U/L 21  24    ALT 0 - 44 U/L 21  19      Renal function Estimated Creatinine Clearance: 73.7 mL/min (by C-G formula based on SCr of 0.98 mg/dL).  No results found for: "HGBA1C"  LDL Cholesterol  Date Value Ref Range Status  08/18/2021 72 0 - 99 mg/dL Final     CT angiogram of aorta shows small focus of soft atherosclerotic plaque at terminal aorta. Possible  source for atheroembolism.  Personally reviewed the recent duplex data.  Technologist is concerned about interval progression of chronic, nonocclusive femoral vein thrombus.  Not sure I agree with the assessment based on my personal review of the images.  There is thrombus visualized in the gastrocnemius  vein, but I am also not sure if this is new.  Yevonne Aline. Stanford Breed, MD [facility] Phone Number: [phone] 06/04/2022 5:01 PM  Total time spent on preparing this encounter including chart review, data review, collecting history, examining the patient, coordinating care for this established patient, 40 minutes  Portions of this report may have been transcribed using voice recognition software.  Every effort has been made to ensure accuracy; however, inadvertent computerized transcription errors may still be present.

## 2022-06-05 ENCOUNTER — Telehealth: Payer: Self-pay | Admitting: *Deleted

## 2022-06-05 NOTE — Chronic Care Management (AMB) (Signed)
  Care Coordination  Note  06/05/2022 Name: Xadrian Craighead Sr. MRN: [id number] DOB: 1951-06-13  [name] is a 71 y.o. year old male who is a primary care patient of Marrian Salvage, [place]. I reached out to [facility] by phone today to offer care coordination services.      Mr. Rubenstein was given information about Care Coordination services today including:  The Care Coordination services include support from the care team which includes your Nurse Coordinator, Clinical Social Worker, or Pharmacist.  The Care Coordination team is here to help remove barriers to the health concerns and goals most important to you. Care Coordination services are voluntary and the patient may decline or stop services at any time by request to their care team member.   Patient agreed to services and verbal consent obtained.   Follow up plan: Telephone appointment with care coordination team member scheduled for: 06/07/2022  Julian Hy, [place] Direct Dial: [phone]

## 2022-06-06 ENCOUNTER — Encounter: Payer: Self-pay | Admitting: Vascular Surgery

## 2022-06-06 ENCOUNTER — Ambulatory Visit (INDEPENDENT_AMBULATORY_CARE_PROVIDER_SITE_OTHER): Payer: Medicare Other | Admitting: Vascular Surgery

## 2022-06-06 VITALS — BP 131/81 | HR 77 | Temp 99.0°F | Resp 20 | Ht 72.0 in | Wt 172.0 lb

## 2022-06-06 DIAGNOSIS — I75021 Atheroembolism of right lower extremity: Secondary | ICD-10-CM | POA: Diagnosis not present

## 2022-06-06 DIAGNOSIS — I82512 Chronic embolism and thrombosis of left femoral vein: Secondary | ICD-10-CM | POA: Diagnosis not present

## 2022-06-07 ENCOUNTER — Ambulatory Visit: Payer: Self-pay | Admitting: Licensed Clinical Social Worker

## 2022-06-07 NOTE — Patient Instructions (Signed)
Visit Information  Thank you for taking time to visit with me today. Please don't hesitate to contact me if I can be of assistance to you.     Following are the goals we discussed today:   Goals Addressed             This Visit's Progress    Coping skills for stress       Care Coordination Interventions: Solution-Focused Strategies employed:  Active listening / Reflection utilized  Problem [place] strategies reviewed Provided psychoeducation for mental health needs  Participation in counseling encouraged  Participation in support group encouraged  Discussed therapy options  for counseling [place]  [phone]   Psychology today  [facility] for group grief support       Our next appointment is by telephone on Aug. 16th at 1:15  Please call the care guide team at [phone] if you need to cancel or reschedule your appointment.   If you are experiencing a Mental Health or [place] or need someone to talk to, please call the Suicide and Crisis Lifeline: 988 call 1-800-273-TALK (toll free, 24 hour hotline)   Patient verbalizes understanding of instructions and care plan provided today and agrees to view in [place]. Active MyChart status and patient understanding of how to access instructions and care plan via MyChart confirmed with patient.     Casimer Lanius, [place] [phone]

## 2022-06-07 NOTE — Patient Outreach (Signed)
  Care Coordination  Initial Visit Note   06/07/2022 Name: Raymond Stavely Sr. MRN: [id number] DOB: 07/19/1951  Raymond Like Sr. is a 71 y.o. year old male who sees Valere Dross, Marvis Repress, FNP for primary care. I spoke with  Raymond Like Sr. by phone today  What matters to the patients health and wellness today?   Help with navigating physical health needs Coping skills to manage stressors   Goals Addressed             This Visit's Progress    Coping skills for stress       Care Coordination Interventions: Solution-Focused Strategies employed:  Active listening / Reflection utilized  Problem [place] strategies reviewed Provided psychoeducation for mental health needs  Participation in counseling encouraged  Participation in support group encouraged  Discussed therapy options  for counseling [place]  [phone]   Psychology today  [facility] for group grief support       SDOH assessments and interventions completed:   Yes SDOH Interventions Today    Flowsheet Row Most Recent Value  SDOH Interventions   Food Insecurity Interventions Intervention Not Indicated  Financial Strain Interventions Financial Counselor  [discussed options]  Housing Interventions Intervention Not Indicated  Stress Interventions Provide Counseling  [discussed therapy options]  Transportation Interventions Intervention Not Indicated      Care Coordination Interventions Activated:  Yes Care Coordination Interventions:  Yes, provided  Follow up plan: Follow up call scheduled for Aug/ 16th at 1:15  Encounter Outcome:  Pt. Visit Completed with LCSW and schedule initial with RN Care Coordinator   Raymond [name], [place] [facility] [phone]

## 2022-06-08 ENCOUNTER — Encounter: Payer: Self-pay | Admitting: Hematology & Oncology

## 2022-06-09 ENCOUNTER — Other Ambulatory Visit: Payer: Self-pay | Admitting: *Deleted

## 2022-06-09 ENCOUNTER — Telehealth: Payer: Self-pay | Admitting: Family

## 2022-06-09 DIAGNOSIS — I82402 Acute embolism and thrombosis of unspecified deep veins of left lower extremity: Secondary | ICD-10-CM

## 2022-06-09 DIAGNOSIS — Z86718 Personal history of other venous thrombosis and embolism: Secondary | ICD-10-CM

## 2022-06-09 DIAGNOSIS — M7989 Other specified soft tissue disorders: Secondary | ICD-10-CM

## 2022-06-09 DIAGNOSIS — I82502 Chronic embolism and thrombosis of unspecified deep veins of left lower extremity: Secondary | ICD-10-CM

## 2022-06-09 NOTE — Telephone Encounter (Signed)
Patient called stating he is trying to reach [facility] regarding their upcoming appointment on 08/02. He states the issues that was going to be discussed has been resolved and no longer needs the appointment.

## 2022-06-12 ENCOUNTER — Ambulatory Visit: Payer: Medicare Other | Attending: Family Medicine | Admitting: Physical Therapy

## 2022-06-12 ENCOUNTER — Encounter: Payer: Self-pay | Admitting: Physical Therapy

## 2022-06-12 DIAGNOSIS — M25652 Stiffness of left hip, not elsewhere classified: Secondary | ICD-10-CM | POA: Diagnosis not present

## 2022-06-12 DIAGNOSIS — M545 Low back pain, unspecified: Secondary | ICD-10-CM | POA: Diagnosis not present

## 2022-06-12 DIAGNOSIS — R293 Abnormal posture: Secondary | ICD-10-CM | POA: Insufficient documentation

## 2022-06-12 DIAGNOSIS — M6281 Muscle weakness (generalized): Secondary | ICD-10-CM | POA: Insufficient documentation

## 2022-06-12 DIAGNOSIS — G8929 Other chronic pain: Secondary | ICD-10-CM | POA: Diagnosis not present

## 2022-06-12 DIAGNOSIS — M542 Cervicalgia: Secondary | ICD-10-CM | POA: Insufficient documentation

## 2022-06-12 NOTE — Therapy (Signed)
OUTPATIENT PHYSICAL THERAPY THORACOLUMBAR EVALUATION   Patient Name: Raymond Neis Sr. MRN: [id number] DOB:March 02, 1951, 71 y.o., male Today's Date: 06/12/2022   PT End of Session - 06/12/22 0846     Visit Number 1    Date for PT Re-Evaluation 09/12/22    Authorization Type Medicare    PT Start Time 0842    PT Stop Time 0926    PT Time Calculation (min) 44 min    Activity Tolerance Patient tolerated treatment well    Behavior During Therapy [facility] for tasks assessed/performed             Past Medical History:  Diagnosis Date   DVT (deep venous thrombosis) (HCC)    GERD (gastroesophageal reflux disease)    Hyperlipidemia    Hypertension    Leg DVT (deep venous thromboembolism), chronic, left ([place]) 12/12/2021   Past Surgical History:  Procedure Laterality Date   EYE SURGERY     Patient Active Problem List   Diagnosis Date Noted   Pain of right lower extremity due to ischemia 05/01/2022   Leg DVT (deep venous thromboembolism), chronic, left ([place]) 12/12/2021   Mixed hyperlipidemia 11/21/2021   Low libido 11/21/2021   Low back pain 11/21/2021   Loss of appetite 11/21/2021   Hypertensive retinopathy 11/21/2021   Essential hypertension 11/21/2021   Chronic pain 11/21/2021   Enlarged prostate 06/20/2021   Degenerative disc disease, cervical 12/03/2020   Degenerative lumbar disc 09/27/2020   Degenerative arthritis of left knee 09/27/2020    PCP: Valere Dross, FNP  REFERRING PROVIDER: Creig Hines  REFERRING DIAG: Back pain and neck pain  Rationale for Evaluation and Treatment Rehabilitation  THERAPY DIAG:  Chronic bilateral low back pain without sciatica  Cervicalgia  Muscle weakness (generalized)  Stiffness of left hip, not elsewhere classified  Abnormal posture  ONSET DATE: 02/12/22  SUBJECTIVE:                                                                                                                                                                                            SUBJECTIVE STATEMENT: Patient has had neck and back pain for a number of months, he is very active and a tennis player, he started PT here in April we were doing stretching and strengthening and he was doing well, he was diagnosed with a DVT in early June, this got very bad where he had purple toes and almost lost a few toes, we stopped PT and he started doing oxygen chambers PERTINENT HISTORY:  Multiple DVT, GERD  PAIN:  Are you having pain? Yes: NPRS scale: 2/10 Pain location: back and neck Pain description:  ache, sore Aggravating factors: walking up stairs, tennis, sitting pain up to 7/10 with stooping and yardwork Relieving factors: Naprosyn pain can be 0/10 but I am very stiff   PRECAUTIONS: None  WEIGHT BEARING RESTRICTIONS No  FALLS:  Has patient fallen in last 6 months? Yes. Number of falls 1  LIVING ENVIRONMENT: Lives with: lives with their family and lives alone Lives in: House/apartment Stairs: Yes: Internal: 12 steps; can reach both Has following equipment at home: None  OCCUPATION: retired  PLOF: Independent and plays tennis 2-3x/week, has not played recently due to the DVT  PATIENT GOALS have less pain, be stronger, continue tennis, be active, more flexible   OBJECTIVE:   DIAGNOSTIC FINDINGS:  HNP in the lumbar area  COGNITION:  Overall cognitive status: Within functional limits for tasks assessed     SENSATION: WFL  MUSCLE LENGTH: Hamstrings: Right 50 deg; Left 50 deg Very tight piriformis on the    POSTURE: rounded shoulders and forward head  PALPATION: Tight in the cervical area, the upper traps and the lumbar area  LUMBAR ROM:   all feel tight per patient,   Active  A/PROM  eval  Flexion Decreased 50%  Extension 50%  Right lateral flexion 50%  Left lateral flexion 50%  Right rotation   Left rotation    (Blank rows = not tested) Cervical ROM;  flexion WNL's, all other motions decreased 50% with c/o tightness  LOWER  EXTREMITY ROM:   WFL's, shoulder WFL's  LOWER EXTREMITY MMT:  shoulders left is 4-/5 due to a RC injury in the left shoulder, LE's 4+/5  FUNCTIONAL TESTS:  Timed up and go (TUG): 15  GAIT: Reports fatigue with walking  miles fatigue  TODAY'S TREATMENT  NuStep Level 5 x 6 minutes Calf stretch   PATIENT EDUCATION:  Education details: HS and piriformis stretch Person educated: Patient Education method: Consulting civil engineer, Demonstration, and Verbal cues Education comprehension: verbalized understanding   HOME EXERCISE PROGRAM: As above  ASSESSMENT:  CLINICAL IMPRESSION: Patient is a 71 y.o. male who was seen today for physical therapy evaluation and treatment for neck and back pain, has had complications with DVT in December and in June.  He is an avid Firefighter, left handed, reports stiffness, pain and difficulty with fatigue.  Very tight HS and pirifmormis, he has not played tennis since June, is looking to try to start again.    OBJECTIVE IMPAIRMENTS Abnormal gait, cardiopulmonary status limiting activity, decreased activity tolerance, decreased balance, decreased endurance, decreased mobility, difficulty walking, decreased ROM, decreased strength, impaired perceived functional ability, increased muscle spasms, impaired flexibility, postural dysfunction, and pain.   REHAB POTENTIAL: Good  CLINICAL DECISION MAKING: Stable/uncomplicated  EVALUATION COMPLEXITY: Low   GOALS: Goals reviewed with patient? Yes  SHORT TERM GOALS: Target date: 06/27/22  Independent with initial HEP Goal status: INITIAL LONG TERM GOALS: Target date: 09/04/22  Indepednetn with advanced HEP Goal status: INITIAL  2.  Understand posture and body mechanics   Goal status: INITIAL  3.  Decreae pain overall 50% Goal status: INITIAL  4.  Return to playing tennis without issue Goal status: INITIAL    PLAN: PT FREQUENCY: 1-2x/week  PT DURATION: 12 weeks  PLANNED INTERVENTIONS: Therapeutic  exercises, Therapeutic activity, Neuromuscular re-education, Balance training, Gait training, Patient/Family education, Self Care, Joint mobilization, Dry Needling, Electrical stimulation, Spinal mobilization, Cryotherapy, Moist heat, Taping, Traction, and Manual therapy.  PLAN FOR NEXT SESSION: start gym activities   Sumner Boast, PT 06/12/2022, 10:23 AM

## 2022-06-13 ENCOUNTER — Telehealth: Payer: Self-pay

## 2022-06-13 ENCOUNTER — Inpatient Hospital Stay: Payer: Medicare Other | Attending: Family

## 2022-06-13 DIAGNOSIS — I82502 Chronic embolism and thrombosis of unspecified deep veins of left lower extremity: Secondary | ICD-10-CM

## 2022-06-13 DIAGNOSIS — Z86718 Personal history of other venous thrombosis and embolism: Secondary | ICD-10-CM | POA: Insufficient documentation

## 2022-06-13 DIAGNOSIS — Z7901 Long term (current) use of anticoagulants: Secondary | ICD-10-CM | POA: Diagnosis not present

## 2022-06-13 DIAGNOSIS — I82402 Acute embolism and thrombosis of unspecified deep veins of left lower extremity: Secondary | ICD-10-CM

## 2022-06-13 DIAGNOSIS — M7989 Other specified soft tissue disorders: Secondary | ICD-10-CM

## 2022-06-13 LAB — PROTIME-INR
INR: 2.4 — ABNORMAL HIGH (ref 0.8–1.2)
Prothrombin Time: 25.6 seconds — ABNORMAL HIGH (ref 11.4–15.2)

## 2022-06-13 NOTE — Telephone Encounter (Signed)
-----   Message from [name], MD sent at 06/13/2022  1:09 PM EDT ----- Call let him know that the INR is 2.4.  This is quite good.  No change in his dose.  We need to repeat the INR on Friday.  Thanks.  Laurey Arrow

## 2022-06-13 NOTE — Telephone Encounter (Signed)
Mychart message sent.

## 2022-06-14 ENCOUNTER — Encounter: Payer: Self-pay | Admitting: Family

## 2022-06-15 ENCOUNTER — Telehealth: Payer: Self-pay | Admitting: *Deleted

## 2022-06-15 ENCOUNTER — Other Ambulatory Visit: Payer: Medicare Other

## 2022-06-15 NOTE — Telephone Encounter (Signed)
Per Pt. Advice message Raymond [name] - lvm of upcoming lab only apt. Requested callback to confirm

## 2022-06-16 ENCOUNTER — Telehealth: Payer: Self-pay

## 2022-06-16 ENCOUNTER — Inpatient Hospital Stay: Payer: Medicare Other

## 2022-06-16 ENCOUNTER — Telehealth: Payer: Self-pay | Admitting: *Deleted

## 2022-06-16 ENCOUNTER — Encounter: Payer: Self-pay | Admitting: Physical Therapy

## 2022-06-16 ENCOUNTER — Ambulatory Visit: Payer: Medicare Other | Attending: Family Medicine | Admitting: Physical Therapy

## 2022-06-16 DIAGNOSIS — M545 Low back pain, unspecified: Secondary | ICD-10-CM | POA: Diagnosis not present

## 2022-06-16 DIAGNOSIS — M25652 Stiffness of left hip, not elsewhere classified: Secondary | ICD-10-CM | POA: Insufficient documentation

## 2022-06-16 DIAGNOSIS — Z7901 Long term (current) use of anticoagulants: Secondary | ICD-10-CM | POA: Diagnosis not present

## 2022-06-16 DIAGNOSIS — Z86718 Personal history of other venous thrombosis and embolism: Secondary | ICD-10-CM | POA: Diagnosis not present

## 2022-06-16 DIAGNOSIS — G8929 Other chronic pain: Secondary | ICD-10-CM | POA: Diagnosis not present

## 2022-06-16 DIAGNOSIS — M542 Cervicalgia: Secondary | ICD-10-CM | POA: Insufficient documentation

## 2022-06-16 DIAGNOSIS — I82502 Chronic embolism and thrombosis of unspecified deep veins of left lower extremity: Secondary | ICD-10-CM

## 2022-06-16 DIAGNOSIS — R293 Abnormal posture: Secondary | ICD-10-CM | POA: Diagnosis not present

## 2022-06-16 DIAGNOSIS — M6281 Muscle weakness (generalized): Secondary | ICD-10-CM | POA: Diagnosis not present

## 2022-06-16 DIAGNOSIS — R29898 Other symptoms and signs involving the musculoskeletal system: Secondary | ICD-10-CM | POA: Insufficient documentation

## 2022-06-16 LAB — PROTIME-INR
INR: 2.7 — ABNORMAL HIGH (ref 0.8–1.2)
Prothrombin Time: 28.5 seconds — ABNORMAL HIGH (ref 11.4–15.2)

## 2022-06-16 NOTE — Telephone Encounter (Signed)
Advised via MyChart.

## 2022-06-16 NOTE — Telephone Encounter (Signed)
-----   Message from [name], MD sent at 06/16/2022  2:15 PM EDT ----- Call and let him know that the INR is perfect at 2.7.  Do not change the Coumadin dose.  We need to recheck in 5 days.  Please set this up.

## 2022-06-16 NOTE — Therapy (Signed)
OUTPATIENT PHYSICAL THERAPY THORACOLUMBAR EVALUATION   Patient Name: Raymond [name]. MRN: [id number] DOB:[birth year], 71 y.o., male Today's Date: 06/16/2022   PT End of Session - 06/16/22 1016     Visit Number 2    Date for PT Re-Evaluation 09/12/22    Authorization Type Medicare    PT Start Time 1015    PT Stop Time 1100    PT Time Calculation (min) 45 min    Activity Tolerance Patient tolerated treatment well    Behavior During Therapy WFL for tasks assessed/performed             Past Medical History:  Diagnosis Date   DVT (deep venous thrombosis) ([place])    GERD (gastroesophageal reflux disease)    Hyperlipidemia    Hypertension    Leg DVT (deep venous thromboembolism), chronic, left ([place]) 12/12/2021   Past Surgical History:  Procedure Laterality Date   EYE SURGERY     Patient Active Problem List   Diagnosis Date Noted   Pain of right lower extremity due to ischemia 05/01/2022   Leg DVT (deep venous thromboembolism), chronic, left ([place]) 12/12/2021   Mixed hyperlipidemia 11/21/2021   Low libido 11/21/2021   Low back pain 11/21/2021   Loss of appetite 11/21/2021   Hypertensive retinopathy 11/21/2021   Essential hypertension 11/21/2021   Chronic pain 11/21/2021   Enlarged prostate 06/20/2021   Degenerative disc disease, cervical 12/03/2020   Degenerative lumbar disc 09/27/2020   Degenerative arthritis of left knee 09/27/2020    PCP: Valere Dross, FNP  REFERRING PROVIDER: Creig Hines  REFERRING DIAG: Back pain and neck pain  Rationale for Evaluation and Treatment Rehabilitation  THERAPY DIAG:  Chronic bilateral low back pain without sciatica  Cervicalgia  Muscle weakness (generalized)  ONSET DATE: 02/12/22  SUBJECTIVE:                                                                                                                                                                                           SUBJECTIVE STATEMENT: Back doesn't hurt just  stiff  PERTINENT HISTORY:  Multiple DVT, GERD  PAIN:  Are you having pain? Yes: NPRS scale: 0/10 Pain location: back and neck Pain description: ache, sore Aggravating factors: walking up stairs, tennis, sitting pain up to 7/10 with stooping and yardwork Relieving factors: Naprosyn pain can be 0/10 but I am very stiff   PRECAUTIONS: None  WEIGHT BEARING RESTRICTIONS No  FALLS:  Has patient fallen in last 6 months? Yes. Number of falls 1  LIVING ENVIRONMENT: Lives with: lives with their family and lives alone Lives in: House/apartment Stairs: Yes: Internal: 12 steps; can reach  both Has following equipment at home: None  OCCUPATION: retired  PLOF: Independent and plays tennis 2-3x/week, has not played recently due to the DVT  PATIENT GOALS have less pain, be stronger, continue tennis, be active, more flexible   OBJECTIVE:   DIAGNOSTIC FINDINGS:  HNP in the lumbar area  MUSCLE LENGTH: Hamstrings: Right 50 deg; Left 50 deg Very tight piriformis on the    POSTURE: rounded shoulders and forward head  PALPATION: Tight in the cervical area, the upper traps and the lumbar area  LUMBAR ROM:   all feel tight per patient,   Active  A/PROM  eval  Flexion Decreased 50%  Extension 50%  Right lateral flexion 50%  Left lateral flexion 50%  Right rotation   Left rotation    (Blank rows = not tested) Cervical ROM;  flexion WNL's, all other motions decreased 50% with c/o tightness  LOWER EXTREMITY ROM:   WFL's, shoulder WFL's  LOWER EXTREMITY MMT:  shoulders left is 4-/5 due to a RC injury in the left shoulder, LE's 4+/5  FUNCTIONAL TESTS:  Timed up and go (TUG): 15  GAIT: Reports fatigue with walking  miles fatigue  TODAY'S TREATMENT  06/16/22 NuStep L5 x 6 min S2S OHP yellow ball 2x10  Shoulder Ext 10lb 2x10 HS curls 25lb 2x10 Leg Ext 10lb 2x10, RLE 5lb 2x10 6in step ups x10 eah Heel raises black bar 2x10 Rows & Lats 25lb 2x10 Bridges x10 LE on Pball  bridges, K2c, Oblq x10   NuStep Level 5 x 6 minutes Calf stretch   PATIENT EDUCATION:  Education details: HS and piriformis stretch Person educated: Patient Education method: Consulting civil engineer, Demonstration, and Verbal cues Education comprehension: verbalized understanding   HOME EXERCISE PROGRAM: As above  ASSESSMENT:  CLINICAL IMPRESSION: Patient tolerated an initial progression of TE well evident by no subjective reports if increase pain. Session consisted of postural and core strength. Increase fatigue noted with sit to stands. Tactile cue to prevent trunk flexion with shoulder extensions. Pt tended to use LLE moe than then R with bilateral Ext so RLE was isolated. Good effort throughout session.   OBJECTIVE IMPAIRMENTS Abnormal gait, cardiopulmonary status limiting activity, decreased activity tolerance, decreased balance, decreased endurance, decreased mobility, difficulty walking, decreased ROM, decreased strength, impaired perceived functional ability, increased muscle spasms, impaired flexibility, postural dysfunction, and pain.   REHAB POTENTIAL: Good  CLINICAL DECISION MAKING: Stable/uncomplicated  EVALUATION COMPLEXITY: Low   GOALS: Goals reviewed with patient? Yes  SHORT TERM GOALS: Target date: 06/27/22  Independent with initial HEP Goal status: partly met LONG TERM GOALS: Target date: 09/04/22  Indepednetn with advanced HEP Goal status: INITIAL  2.  Understand posture and body mechanics   Goal status: INITIAL  3.  Decreae pain overall 50% Goal status: INITIAL  4.  Return to playing tennis without issue Goal status: INITIAL    PLAN: PT FREQUENCY: 1-2x/week  PT DURATION: 12 weeks  PLANNED INTERVENTIONS: Therapeutic exercises, Therapeutic activity, Neuromuscular re-education, Balance training, Gait training, Patient/Family education, Self Care, Joint mobilization, Dry Needling, Electrical stimulation, Spinal mobilization, Cryotherapy, Moist heat,  Taping, Traction, and Manual therapy.  PLAN FOR NEXT SESSION: start gym activities   Scot Jun, PTA 06/16/2022, 10:16 AM

## 2022-06-16 NOTE — Telephone Encounter (Signed)
Per scheduling message Raymond [name] - called patient and lvm for a callback to schedule lab only appointment.

## 2022-06-18 ENCOUNTER — Encounter: Payer: Self-pay | Admitting: Family Medicine

## 2022-06-19 ENCOUNTER — Encounter: Payer: Self-pay | Admitting: Physical Therapy

## 2022-06-19 ENCOUNTER — Ambulatory Visit: Payer: Medicare Other | Admitting: Physical Therapy

## 2022-06-19 DIAGNOSIS — M542 Cervicalgia: Secondary | ICD-10-CM

## 2022-06-19 DIAGNOSIS — M6281 Muscle weakness (generalized): Secondary | ICD-10-CM

## 2022-06-19 DIAGNOSIS — M25652 Stiffness of left hip, not elsewhere classified: Secondary | ICD-10-CM | POA: Diagnosis not present

## 2022-06-19 DIAGNOSIS — G8929 Other chronic pain: Secondary | ICD-10-CM | POA: Diagnosis not present

## 2022-06-19 DIAGNOSIS — M545 Low back pain, unspecified: Secondary | ICD-10-CM | POA: Diagnosis not present

## 2022-06-19 DIAGNOSIS — R293 Abnormal posture: Secondary | ICD-10-CM | POA: Diagnosis not present

## 2022-06-19 NOTE — Telephone Encounter (Signed)
Patient called and left a message for you to call him regarding the message below.

## 2022-06-19 NOTE — Therapy (Signed)
OUTPATIENT PHYSICAL THERAPY THORACOLUMBAR EVALUATION   Patient Name: Raymond Haigler Sr. MRN: [id number] DOB:03-19-1951, 71 y.o., male Today's Date: 06/19/2022   PT End of Session - 06/19/22 1148     Visit Number 3    Date for PT Re-Evaluation 09/12/22    Authorization Type Medicare    PT Start Time 1145    PT Stop Time 1230    PT Time Calculation (min) 45 min    Activity Tolerance Patient tolerated treatment well    Behavior During Therapy WFL for tasks assessed/performed             Past Medical History:  Diagnosis Date   DVT (deep venous thrombosis) ([place])    GERD (gastroesophageal reflux disease)    Hyperlipidemia    Hypertension    Leg DVT (deep venous thromboembolism), chronic, left ([place]) 12/12/2021   Past Surgical History:  Procedure Laterality Date   EYE SURGERY     Patient Active Problem List   Diagnosis Date Noted   Pain of right lower extremity due to ischemia 05/01/2022   Leg DVT (deep venous thromboembolism), chronic, left ([place]) 12/12/2021   Mixed hyperlipidemia 11/21/2021   Low libido 11/21/2021   Low back pain 11/21/2021   Loss of appetite 11/21/2021   Hypertensive retinopathy 11/21/2021   Essential hypertension 11/21/2021   Chronic pain 11/21/2021   Enlarged prostate 06/20/2021   Degenerative disc disease, cervical 12/03/2020   Degenerative lumbar disc 09/27/2020   Degenerative arthritis of left knee 09/27/2020    PCP: Valere Dross, FNP  REFERRING PROVIDER: Creig Hines  REFERRING DIAG: Back pain and neck pain  Rationale for Evaluation and Treatment Rehabilitation  THERAPY DIAG:  Chronic bilateral low back pain without sciatica  Cervicalgia  Muscle weakness (generalized)  ONSET DATE: 02/12/22  SUBJECTIVE:                                                                                                                                                                                           SUBJECTIVE STATEMENT: Felt good after last session.  No pain just stiff  PERTINENT HISTORY:  Multiple DVT, GERD  PAIN:  Are you having pain? Yes: NPRS scale: 0/10 Pain location: back and neck Pain description: ache, sore Aggravating factors: walking up stairs, tennis, sitting pain up to 7/10 with stooping and yardwork Relieving factors: Naprosyn pain can be 0/10 but I am very stiff   PRECAUTIONS: None  WEIGHT BEARING RESTRICTIONS No  FALLS:  Has patient fallen in last 6 months? Yes. Number of falls 1  LIVING ENVIRONMENT: Lives with: lives with their family and lives alone Lives in: House/apartment Stairs: Yes: Internal:  12 steps; can reach both Has following equipment at home: None  OCCUPATION: retired  PLOF: Independent and plays tennis 2-3x/week, has not played recently due to the DVT  PATIENT GOALS have less pain, be stronger, continue tennis, be active, more flexible   OBJECTIVE:   DIAGNOSTIC FINDINGS:  HNP in the lumbar area  MUSCLE LENGTH: Hamstrings: Right 50 deg; Left 50 deg Very tight piriformis on the    POSTURE: rounded shoulders and forward head  PALPATION: Tight in the cervical area, the upper traps and the lumbar area  LUMBAR ROM:   all feel tight per patient,   Active  A/PROM  eval  Flexion Decreased 50%  Extension 50%  Right lateral flexion 50%  Left lateral flexion 50%  Right rotation   Left rotation    (Blank rows = not tested) Cervical ROM;  flexion WNL's, all other motions decreased 50% with c/o tightness  LOWER EXTREMITY ROM:   WFL's, shoulder WFL's  LOWER EXTREMITY MMT:  shoulders left is 4-/5 due to a RC injury in the left shoulder, LE's 4+/5  FUNCTIONAL TESTS:  Timed up and go (TUG): 15  GAIT: Reports fatigue with walking  miles fatigue  TODAY'S TREATMENT  06/19/22 Rows & Lats 35lb 2x10 Recumbent Bike L3 x6 min AR press 20lb 2x10 Leg press 50lb 3x10  Shoulder Ext 10lb 2x10 HS curls 25lb 2x10 Leg Ext 10lb 2x10, RLE 5lb 2x10   LE on Pball bridges, K2C, Oblq  Hamstring  stretch x3 10''  06/16/22 NuStep L5 x 6 min S2S OHP yellow ball 2x10  Shoulder Ext 10lb 2x10 HS curls 35lb 2x10 Leg Ext 10lb 2x10, RLE 5lb 2x10 6in step ups x10 eah Heel raises black bar 2x10 Rows & Lats 25lb 2x10 Bridges x10 LE on Pball bridges, K2c, Oblq x10   NuStep Level 5 x 6 minutes Calf stretch   PATIENT EDUCATION:  Education details: HS and piriformis stretch Person educated: Patient Education method: Consulting civil engineer, Demonstration, and Verbal cues Education comprehension: verbalized understanding   HOME EXERCISE PROGRAM: As above  ASSESSMENT:  CLINICAL IMPRESSION: Patient enters doing well with no pain only stiffness Session consisted of postural and core strength.  Tactile cue to prevent trunk flexion with shoulder extensions. Increase resistance tolerated with rows and lats. Again pt tended to use LLE moe than then R with bilateral Ext so RLE was isolated. Bilateral HS tightness noted. Good effort throughout session.    OBJECTIVE IMPAIRMENTS Abnormal gait, cardiopulmonary status limiting activity, decreased activity tolerance, decreased balance, decreased endurance, decreased mobility, difficulty walking, decreased ROM, decreased strength, impaired perceived functional ability, increased muscle spasms, impaired flexibility, postural dysfunction, and pain.   REHAB POTENTIAL: Good  CLINICAL DECISION MAKING: Stable/uncomplicated  EVALUATION COMPLEXITY: Low   GOALS: Goals reviewed with patient? Yes  SHORT TERM GOALS: Target date: 06/27/22  Independent with initial HEP Goal status: partly met LONG TERM GOALS: Target date: 09/04/22  Indepednetn with advanced HEP Goal status: INITIAL  2.  Understand posture and body mechanics   Goal status: INITIAL  3.  Decreae pain overall 50% Goal status: INITIAL  4.  Return to playing tennis without issue Goal status: INITIAL    PLAN: PT FREQUENCY: 1-2x/week  PT DURATION: 12 weeks  PLANNED INTERVENTIONS:  Therapeutic exercises, Therapeutic activity, Neuromuscular re-education, Balance training, Gait training, Patient/Family education, Self Care, Joint mobilization, Dry Needling, Electrical stimulation, Spinal mobilization, Cryotherapy, Moist heat, Taping, Traction, and Manual therapy.  PLAN FOR NEXT SESSION: start gym activities   Scot Jun, PTA  06/19/2022, 11:49 AM

## 2022-06-20 ENCOUNTER — Telehealth: Payer: Self-pay

## 2022-06-20 NOTE — Telephone Encounter (Signed)
Pt called stating that he wanted a recommendation on a dietician. He had not received that at his last visit.  Reviewed pt's chart, got recommendation from Dr Stanford Breed, returned pt's call, no answer, lf vm recommending [facility]. Gave address and phone number.

## 2022-06-20 NOTE — Addendum Note (Signed)
Addended by: Sumner Boast on: 06/20/2022 08:55 AM   Modules accepted: Orders

## 2022-06-21 ENCOUNTER — Telehealth: Payer: Self-pay

## 2022-06-21 ENCOUNTER — Inpatient Hospital Stay: Payer: Medicare Other

## 2022-06-21 ENCOUNTER — Ambulatory Visit: Payer: Medicare Other | Admitting: Physical Therapy

## 2022-06-21 ENCOUNTER — Encounter: Payer: Self-pay | Admitting: *Deleted

## 2022-06-21 DIAGNOSIS — I82502 Chronic embolism and thrombosis of unspecified deep veins of left lower extremity: Secondary | ICD-10-CM

## 2022-06-21 DIAGNOSIS — Z7901 Long term (current) use of anticoagulants: Secondary | ICD-10-CM | POA: Diagnosis not present

## 2022-06-21 DIAGNOSIS — Z86718 Personal history of other venous thrombosis and embolism: Secondary | ICD-10-CM | POA: Diagnosis not present

## 2022-06-21 LAB — PROTIME-INR
INR: 5.8 (ref 0.8–1.2)
Prothrombin Time: 51.6 s — ABNORMAL HIGH (ref 11.4–15.2)

## 2022-06-21 NOTE — Progress Notes (Unsigned)
CRITICAL INR RESULT OF 5.8 CALLED TO, READ BACK BY AND VERIFIED WITH DONNICA HARRIS RN @ [address] ON 06/21/2022.

## 2022-06-21 NOTE — Telephone Encounter (Signed)
Attempted to contact pt re: INR/Coumadin dose change. Voicemail was male voice stating "you have reached the offices of [facility]." Unsure if this is personal VM, therefore secure pt message will be sent instead. dph

## 2022-06-22 ENCOUNTER — Ambulatory Visit: Payer: Medicare Other | Admitting: Physical Medicine & Rehabilitation

## 2022-06-23 ENCOUNTER — Encounter: Payer: Medicare Other | Admitting: Physical Medicine & Rehabilitation

## 2022-06-26 ENCOUNTER — Encounter: Payer: Self-pay | Admitting: Physical Therapy

## 2022-06-26 ENCOUNTER — Ambulatory Visit: Payer: Medicare Other | Admitting: Physical Therapy

## 2022-06-26 DIAGNOSIS — G8929 Other chronic pain: Secondary | ICD-10-CM | POA: Diagnosis not present

## 2022-06-26 DIAGNOSIS — R293 Abnormal posture: Secondary | ICD-10-CM | POA: Diagnosis not present

## 2022-06-26 DIAGNOSIS — M542 Cervicalgia: Secondary | ICD-10-CM

## 2022-06-26 DIAGNOSIS — M25652 Stiffness of left hip, not elsewhere classified: Secondary | ICD-10-CM

## 2022-06-26 DIAGNOSIS — M6281 Muscle weakness (generalized): Secondary | ICD-10-CM

## 2022-06-26 DIAGNOSIS — M545 Low back pain, unspecified: Secondary | ICD-10-CM | POA: Diagnosis not present

## 2022-06-26 NOTE — Therapy (Signed)
OUTPATIENT PHYSICAL THERAPY THORACOLUMBAR EVALUATION   Patient Name: Raymond Mcbee Sr. MRN: [id number] DOB:03/19/1951, 71 y.o., male Today's Date: 06/26/2022   PT End of Session - 06/26/22 1404     Visit Number 4    Date for PT Re-Evaluation 09/12/22    Authorization Type Medicare    PT Start Time 1355    PT Stop Time 1446    PT Time Calculation (min) 51 min    Activity Tolerance Patient tolerated treatment well    Behavior During Therapy WFL for tasks assessed/performed             Past Medical History:  Diagnosis Date   DVT (deep venous thrombosis) ([place])    GERD (gastroesophageal reflux disease)    Hyperlipidemia    Hypertension    Leg DVT (deep venous thromboembolism), chronic, left ([place]) 12/12/2021   Past Surgical History:  Procedure Laterality Date   EYE SURGERY     Patient Active Problem List   Diagnosis Date Noted   Pain of right lower extremity due to ischemia 05/01/2022   Leg DVT (deep venous thromboembolism), chronic, left ([place]) 12/12/2021   Mixed hyperlipidemia 11/21/2021   Low libido 11/21/2021   Low back pain 11/21/2021   Loss of appetite 11/21/2021   Hypertensive retinopathy 11/21/2021   Essential hypertension 11/21/2021   Chronic pain 11/21/2021   Enlarged prostate 06/20/2021   Degenerative disc disease, cervical 12/03/2020   Degenerative lumbar disc 09/27/2020   Degenerative arthritis of left knee 09/27/2020    PCP: Valere Dross, FNP  REFERRING PROVIDER: Creig Hines  REFERRING DIAG: Back pain and neck pain  Rationale for Evaluation and Treatment Rehabilitation  THERAPY DIAG:  Chronic bilateral low back pain without sciatica  Cervicalgia  Muscle weakness (generalized)  Stiffness of left hip, not elsewhere classified  Abnormal posture  ONSET DATE: 02/12/22  SUBJECTIVE:                                                                                                                                                                                            SUBJECTIVE STATEMENT: Patient reports that he has been dealing with some new health issues, reports lost 6 #, he reports Coumadin issues and he is working on this he does report concern about a tick bite and some health issues he is having the past 10 days, reports increased neck pain today with some less activity and sleeping on the couch  PERTINENT HISTORY:  Multiple DVT, GERD  PAIN:  Are you having pain? Yes: NPRS scale: 4/10 Pain location: back and neck Pain description: ache, sore Aggravating factors: walking up stairs, tennis, sitting pain  up to 7/10 with stooping and yardwork Relieving factors: Naprosyn pain can be 0/10 but I am very stiff   PRECAUTIONS: None  WEIGHT BEARING RESTRICTIONS No  FALLS:  Has patient fallen in last 6 months? Yes. Number of falls 1  LIVING ENVIRONMENT: Lives with: lives with their family and lives alone Lives in: House/apartment Stairs: Yes: Internal: 12 steps; can reach both Has following equipment at home: None  OCCUPATION: retired  PLOF: Independent and plays tennis 2-3x/week, has not played recently due to the DVT  PATIENT GOALS have less pain, be stronger, continue tennis, be active, more flexible   OBJECTIVE:   DIAGNOSTIC FINDINGS:  HNP in the lumbar area  MUSCLE LENGTH: Hamstrings: Right 50 deg; Left 50 deg Very tight piriformis on the    POSTURE: rounded shoulders and forward head  PALPATION: Tight in the cervical area, the upper traps and the lumbar area  LUMBAR ROM:   all feel tight per patient,   Active  A/PROM  eval  Flexion Decreased 50%  Extension 50%  Right lateral flexion 50%  Left lateral flexion 50%  Right rotation   Left rotation    (Blank rows = not tested) Cervical ROM;  flexion WNL's, all other motions decreased 50% with c/o tightness  LOWER EXTREMITY ROM:   WFL's, shoulder WFL's  LOWER EXTREMITY MMT:  shoulders left is 4-/5 due to a RC injury in the left shoulder, LE's  4+/5  FUNCTIONAL TESTS:  Timed up and go (TUG): 15  GAIT: Reports fatigue with walking  miles fatigue  TODAY'S TREATMENT  06/26/22 Nustep level 5 x 6 minutes Rows and lats 25# 2 x10 each Leg curls 25# 2x10 Leg extension 5# 2x10 Leg press 30# 2x10 Passive stretch of HS and piriformis MHP/IFC to the CT area in supine   06/19/22 Rows & Lats 35lb 2x10 Recumbent Bike L3 x6 min AR press 20lb 2x10 Leg press 50lb 3x10  Shoulder Ext 10lb 2x10 HS curls 25lb 2x10 Leg Ext 10lb 2x10, RLE 5lb 2x10   LE on Pball bridges, K2C, Oblq  Hamstring stretch x3 10''  06/16/22 NuStep L5 x 6 min S2S OHP yellow ball 2x10  Shoulder Ext 10lb 2x10 HS curls 35lb 2x10 Leg Ext 10lb 2x10, RLE 5lb 2x10 6in step ups x10 eah Heel raises black bar 2x10 Rows & Lats 25lb 2x10 Bridges x10 LE on Pball bridges, K2c, Oblq x10   NuStep Level 5 x 6 minutes Calf stretch   PATIENT EDUCATION:  Education details: HS and piriformis stretch Person educated: Patient Education method: Consulting civil engineer, Demonstration, and Verbal cues Education comprehension: verbalized understanding   HOME EXERCISE PROGRAM: As above  ASSESSMENT:  CLINICAL IMPRESSION: Patient has had some issues with fatigue and weight loss and his coumadin levels, he is working on the coumadin levels but does report a tick bite about 2 weeks ago, he has not seen the MD yet and wants to back down on exercise some as he reports sitting more due to fatigue has caused neck pain, I did less weight and finished with stretches and IF to the neck area  OBJECTIVE IMPAIRMENTS Abnormal gait, cardiopulmonary status limiting activity, decreased activity tolerance, decreased balance, decreased endurance, decreased mobility, difficulty walking, decreased ROM, decreased strength, impaired perceived functional ability, increased muscle spasms, impaired flexibility, postural dysfunction, and pain.   REHAB POTENTIAL: Good  CLINICAL DECISION MAKING:  Stable/uncomplicated  EVALUATION COMPLEXITY: Low   GOALS: Goals reviewed with patient? Yes  SHORT TERM GOALS: Target date: 06/27/22  Independent with  initial HEP Goal status: met LONG TERM GOALS: Target date: 09/04/22  Indepednetn with advanced HEP Goal status: INITIAL  2.  Understand posture and body mechanics   Goal status: INITIAL  3.  Decreae pain overall 50% Goal status: INITIAL  4.  Return to playing tennis without issue Goal status: on going    PLAN: PT FREQUENCY: 1-2x/week  PT DURATION: 12 weeks  PLANNED INTERVENTIONS: Therapeutic exercises, Therapeutic activity, Neuromuscular re-education, Balance training, Gait training, Patient/Family education, Self Care, Joint mobilization, Dry Needling, Electrical stimulation, Spinal mobilization, Cryotherapy, Moist heat, Taping, Traction, and Manual therapy.  PLAN FOR NEXT SESSION: see how is health is and advance as tolerated with his health issues   Sumner Boast, PT 06/26/2022, 2:05 PM

## 2022-06-28 ENCOUNTER — Ambulatory Visit: Payer: Medicare Other | Admitting: Physical Therapy

## 2022-06-28 ENCOUNTER — Telehealth: Payer: Self-pay

## 2022-06-28 ENCOUNTER — Encounter: Payer: Self-pay | Admitting: Physical Therapy

## 2022-06-28 ENCOUNTER — Encounter: Payer: Self-pay | Admitting: Licensed Clinical Social Worker

## 2022-06-28 ENCOUNTER — Inpatient Hospital Stay: Payer: Medicare Other

## 2022-06-28 DIAGNOSIS — M542 Cervicalgia: Secondary | ICD-10-CM | POA: Diagnosis not present

## 2022-06-28 DIAGNOSIS — M6281 Muscle weakness (generalized): Secondary | ICD-10-CM | POA: Diagnosis not present

## 2022-06-28 DIAGNOSIS — Z86718 Personal history of other venous thrombosis and embolism: Secondary | ICD-10-CM | POA: Diagnosis not present

## 2022-06-28 DIAGNOSIS — G8929 Other chronic pain: Secondary | ICD-10-CM | POA: Diagnosis not present

## 2022-06-28 DIAGNOSIS — R293 Abnormal posture: Secondary | ICD-10-CM

## 2022-06-28 DIAGNOSIS — M25652 Stiffness of left hip, not elsewhere classified: Secondary | ICD-10-CM

## 2022-06-28 DIAGNOSIS — R29898 Other symptoms and signs involving the musculoskeletal system: Secondary | ICD-10-CM

## 2022-06-28 DIAGNOSIS — I82502 Chronic embolism and thrombosis of unspecified deep veins of left lower extremity: Secondary | ICD-10-CM

## 2022-06-28 DIAGNOSIS — M545 Low back pain, unspecified: Secondary | ICD-10-CM | POA: Diagnosis not present

## 2022-06-28 DIAGNOSIS — Z7901 Long term (current) use of anticoagulants: Secondary | ICD-10-CM | POA: Diagnosis not present

## 2022-06-28 LAB — PROTIME-INR
INR: 4.9 (ref 0.8–1.2)
Prothrombin Time: 45.5 seconds — ABNORMAL HIGH (ref 11.4–15.2)

## 2022-06-28 NOTE — Therapy (Signed)
OUTPATIENT PHYSICAL THERAPY THORACOLUMBAR TREATMENT   Patient Name: Raymond [name]. MRN: [id number] DOB:07-15-1951, 71 y.o., male Today's Date: 06/28/2022   PT End of Session - 06/28/22 1659     Visit Number 5    Date for PT Re-Evaluation 09/12/22    Authorization Type Medicare    PT Start Time 1657    PT Stop Time 1537    PT Time Calculation (min) 56 min    Activity Tolerance Patient tolerated treatment well    Behavior During Therapy WFL for tasks assessed/performed             Past Medical History:  Diagnosis Date   DVT (deep venous thrombosis) ([place])    GERD (gastroesophageal reflux disease)    Hyperlipidemia    Hypertension    Leg DVT (deep venous thromboembolism), chronic, left ([place]) 12/12/2021   Past Surgical History:  Procedure Laterality Date   EYE SURGERY     Patient Active Problem List   Diagnosis Date Noted   Pain of right lower extremity due to ischemia 05/01/2022   Leg DVT (deep venous thromboembolism), chronic, left ([place]) 12/12/2021   Mixed hyperlipidemia 11/21/2021   Low libido 11/21/2021   Low back pain 11/21/2021   Loss of appetite 11/21/2021   Hypertensive retinopathy 11/21/2021   Essential hypertension 11/21/2021   Chronic pain 11/21/2021   Enlarged prostate 06/20/2021   Degenerative disc disease, cervical 12/03/2020   Degenerative lumbar disc 09/27/2020   Degenerative arthritis of left knee 09/27/2020    PCP: Valere Dross, FNP  REFERRING PROVIDER: Creig Hines  REFERRING DIAG: Back pain and neck pain  Rationale for Evaluation and Treatment Rehabilitation  THERAPY DIAG:  Chronic bilateral low back pain without sciatica  Cervicalgia  Muscle weakness (generalized)  Stiffness of left hip, not elsewhere classified  Abnormal posture  Other symptoms and signs involving the musculoskeletal system  ONSET DATE: 02/12/22  SUBJECTIVE:                                                                                                                                                                                            SUBJECTIVE STATEMENT: Patient reports that the last treatment really helped, thinks he is getting better, still very sore and had to do some cleaning today  PERTINENT HISTORY:  Multiple DVT, GERD  PAIN:  Are you having pain? Yes: NPRS scale: 3/10 Pain location: back and neck Pain description: ache, sore Aggravating factors: walking up stairs, tennis, sitting pain up to 7/10 with stooping and yardwork Relieving factors: Naprosyn pain can be 0/10 but I am very stiff   PRECAUTIONS: None  WEIGHT BEARING RESTRICTIONS  No  FALLS:  Has patient fallen in last 6 months? Yes. Number of falls 1  LIVING ENVIRONMENT: Lives with: lives with their family and lives alone Lives in: House/apartment Stairs: Yes: Internal: 12 steps; can reach both Has following equipment at home: None  OCCUPATION: retired  PLOF: Independent and plays tennis 2-3x/week, has not played recently due to the DVT  PATIENT GOALS have less pain, be stronger, continue tennis, be active, more flexible   OBJECTIVE:   DIAGNOSTIC FINDINGS:  HNP in the lumbar area  MUSCLE LENGTH: Hamstrings: Right 50 deg; Left 50 deg Very tight piriformis on the    POSTURE: rounded shoulders and forward head  PALPATION: Tight in the cervical area, the upper traps and the lumbar area  LUMBAR ROM:   all feel tight per patient,   Active  A/PROM  eval  Flexion Decreased 50%  Extension 50%  Right lateral flexion 50%  Left lateral flexion 50%  Right rotation   Left rotation    (Blank rows = not tested) Cervical ROM;  flexion WNL's, all other motions decreased 50% with c/o tightness  LOWER EXTREMITY ROM:   WFL's, shoulder WFL's  LOWER EXTREMITY MMT:  shoulders left is 4-/5 due to a RC injury in the left shoulder, LE's 4+/5  FUNCTIONAL TESTS:  Timed up and go (TUG): 15  GAIT: Reports fatigue with walking  miles fatigue  TODAY'S TREATMENT   06/28/22 NuStep Level 5 x 6 minutes Rows and Lats 25# 2x10 Leg extension 5# 2x10 Leg curls 25# 2x10 Leg PRess 20# 2x10 Feet on ball K2C, trunk rotation, small bridges, isometric abs Passive HS and piriformis stretches MHP/IFC to C/T area  06/26/22 Nustep level 5 x 6 minutes Rows and lats 25# 2 x10 each Leg curls 25# 2x10 Leg extension 5# 2x10 Leg press 30# 2x10 Passive stretch of HS and piriformis MHP/IFC to the CT area in supine   06/19/22 Rows & Lats 35lb 2x10 Recumbent Bike L3 x6 min AR press 20lb 2x10 Leg press 50lb 3x10  Shoulder Ext 10lb 2x10 HS curls 25lb 2x10 Leg Ext 10lb 2x10, RLE 5lb 2x10   LE on Pball bridges, K2C, Oblq  Hamstring stretch x3 10''  PATIENT EDUCATION:  Education details: HS and piriformis stretch Person educated: Patient Education method: Explanation, Demonstration, and Verbal cues Education comprehension: verbalized understanding   HOME EXERCISE PROGRAM: As above  ASSESSMENT:  CLINICAL IMPRESSION: Patient felt like the last treatment really helped him feel better overall, I replicated that and added a few more stability exercises, he tolerated without issue and without increase of pain, still very tight and guarded.  OBJECTIVE IMPAIRMENTS Abnormal gait, cardiopulmonary status limiting activity, decreased activity tolerance, decreased balance, decreased endurance, decreased mobility, difficulty walking, decreased ROM, decreased strength, impaired perceived functional ability, increased muscle spasms, impaired flexibility, postural dysfunction, and pain.   REHAB POTENTIAL: Good  CLINICAL DECISION MAKING: Stable/uncomplicated  EVALUATION COMPLEXITY: Low   GOALS: Goals reviewed with patient? Yes  SHORT TERM GOALS: Target date: 06/27/22  Independent with initial HEP Goal status: met LONG TERM GOALS: Target date: 09/04/22  Indepednetn with advanced HEP Goal status: ongoing  2.  Understand posture and body mechanics   Goal status:  INITIAL  3.  Decreae pain overall 50% Goal status: INITIAL  4.  Return to playing tennis without issue Goal status: on going    PLAN: PT FREQUENCY: 1-2x/week  PT DURATION: 12 weeks  PLANNED INTERVENTIONS: Therapeutic exercises, Therapeutic activity, Neuromuscular re-education, Balance training, Gait training, Patient/Family education,   Self Care, Joint mobilization, Dry Needling, Electrical stimulation, Spinal mobilization, Cryotherapy, Moist heat, Taping, Traction, and Manual therapy.  PLAN FOR NEXT SESSION: see how is health is and advance as tolerated with his health issues   Sumner Boast, PT 06/28/2022, 5:00 PM

## 2022-06-28 NOTE — Progress Notes (Unsigned)
PT = 45.5 INR = 4.9.  Critical Result: Notified to [facility] at 1609 on 16Aug23 by [facility].

## 2022-06-28 NOTE — Telephone Encounter (Signed)
Critical pt/inr received from lab. MD reviewed and orders received. Sent patient mychart message with orders

## 2022-06-29 ENCOUNTER — Ambulatory Visit: Payer: Medicare Other | Admitting: Podiatry

## 2022-06-29 ENCOUNTER — Encounter: Payer: Self-pay | Admitting: Family

## 2022-06-29 ENCOUNTER — Ambulatory Visit (INDEPENDENT_AMBULATORY_CARE_PROVIDER_SITE_OTHER): Payer: Medicare Other | Admitting: Family

## 2022-06-29 ENCOUNTER — Ambulatory Visit: Payer: Medicare Other | Admitting: Family Medicine

## 2022-06-29 VITALS — BP 118/68 | HR 70 | Temp 98.0°F | Ht 71.0 in | Wt 172.6 lb

## 2022-06-29 DIAGNOSIS — R197 Diarrhea, unspecified: Secondary | ICD-10-CM | POA: Diagnosis not present

## 2022-06-29 LAB — AMYLASE: Amylase: 43 U/L (ref 27–131)

## 2022-06-29 LAB — CBC WITH DIFFERENTIAL/PLATELET
Basophils Absolute: 0.1 10*3/uL (ref 0.0–0.1)
Basophils Relative: 0.7 % (ref 0.0–3.0)
Eosinophils Absolute: 0.2 10*3/uL (ref 0.0–0.7)
Eosinophils Relative: 2 % (ref 0.0–5.0)
HCT: 42.5 % (ref 39.0–52.0)
Hemoglobin: 13.9 g/dL (ref 13.0–17.0)
Lymphocytes Relative: 30.8 % (ref 12.0–46.0)
Lymphs Abs: 2.4 10*3/uL (ref 0.7–4.0)
MCHC: 32.7 g/dL (ref 30.0–36.0)
MCV: 96.2 fl (ref 78.0–100.0)
Monocytes Absolute: 0.7 10*3/uL (ref 0.1–1.0)
Monocytes Relative: 8.4 % (ref 3.0–12.0)
Neutro Abs: 4.6 10*3/uL (ref 1.4–7.7)
Neutrophils Relative %: 58.1 % (ref 43.0–77.0)
Platelets: 301 10*3/uL (ref 150.0–400.0)
RBC: 4.42 Mil/uL (ref 4.22–5.81)
RDW: 13.8 % (ref 11.5–15.5)
WBC: 7.9 10*3/uL (ref 4.0–10.5)

## 2022-06-29 LAB — COMPREHENSIVE METABOLIC PANEL
ALT: 19 U/L (ref 0–53)
AST: 22 U/L (ref 0–37)
Albumin: 3.9 g/dL (ref 3.5–5.2)
Alkaline Phosphatase: 88 U/L (ref 39–117)
BUN: 13 mg/dL (ref 6–23)
CO2: 30 mEq/L (ref 19–32)
Calcium: 8.7 mg/dL (ref 8.4–10.5)
Chloride: 104 mEq/L (ref 96–112)
Creatinine, Ser: 0.87 mg/dL (ref 0.40–1.50)
GFR: 85.74 mL/min (ref >60.00)
Glucose, Bld: 89 mg/dL (ref 70–99)
Potassium: 4.3 mEq/L (ref 3.5–5.1)
Sodium: 141 mEq/L (ref 135–145)
Total Bilirubin: 0.7 mg/dL (ref 0.2–1.2)
Total Protein: 6.8 g/dL (ref 6.0–8.3)

## 2022-06-29 LAB — LIPASE: Lipase: 43 U/L (ref 11.0–59.0)

## 2022-06-29 NOTE — Progress Notes (Signed)
Raymond Busch Sr. is a 71 y.o. male with the following history as recorded in Epic[employer]:  Patient Active Problem List   Diagnosis Date Noted   Pain of right lower extremity due to ischemia 05/01/2022   Leg DVT (deep venous thromboembolism), chronic, left ([facility]) 12/12/2021   Mixed hyperlipidemia 11/21/2021   Low libido 11/21/2021   Low back pain 11/21/2021   Loss of appetite 11/21/2021   Hypertensive retinopathy 11/21/2021   Essential hypertension 11/21/2021   Chronic pain 11/21/2021   Enlarged prostate 06/20/2021   Degenerative disc disease, cervical 12/03/2020   Degenerative lumbar disc 09/27/2020   Degenerative arthritis of left knee 09/27/2020    Current Outpatient Medications  Medication Sig Dispense Refill   atorvastatin (LIPITOR) 10 MG tablet Take 1 tablet (10 mg total) by mouth daily. 90 tablet 3   cholecalciferol (VITAMIN D3) 25 MCG (1000 UNIT) tablet Take 1,000 Units by mouth daily.     fluoruracil (FLUOROPLEX) 1 % cream 1 application to affected area     gabapentin (NEURONTIN) 100 MG capsule Take 200 mg by mouth daily.     hydrochlorothiazide (MICROZIDE) 12.5 MG capsule TAKE ONE CAPSULE BY MOUTH ONE TIME DAILY 90 capsule 1   hydrocortisone cream 1 % Apply 1 application topically 2 (two) times daily.     losartan (COZAAR) 50 MG tablet TAKE ONE TABLET BY MOUTH ONE TIME DAILY 90 tablet 1   naproxen sodium (ALEVE) 220 MG tablet Take 220 mg by mouth as needed. Takes before playing tennis.     nitroGLYCERIN (NITROGLYN) 2 % ointment Apply 0.5 inches topically 4 (four) times daily. 30 g 0   warfarin (COUMADIN) 5 MG tablet Take 5 mg by mouth daily. 06/21/2022 - pt to begin alternating 38m with 578mevery other day.     No current facility-administered medications for this visit.    Allergies: Penicillins and Penicillin v potassium  Past Medical History:  Diagnosis Date   DVT (deep venous thrombosis) ([facility])    GERD (gastroesophageal reflux disease)    Hyperlipidemia     Hypertension    Leg DVT (deep venous thromboembolism), chronic, left ([facility]aston01/30/2023    Past Surgical History:  Procedure Laterality Date   EYE SURGERY      Family History  Problem Relation Age of Onset   Alcohol abuse Mother    Arthritis Mother    Diabetes Mother    Mental retardation Mother    Alcohol abuse Father    Hypertension Father    Cancer Sister    Depression Sister    Cancer Sister    Arthritis Maternal Grandmother    Parkinson's disease Maternal Grandfather     Social History   Tobacco Use   Smoking status: Never   Smokeless tobacco: Not on file  Substance Use Topics   Alcohol use: Not on file    Subjective:  Diarrhea x 2 weeks; has been using Imodium intermittently; last took Imodium on Monday; has not had any diarrhea since Monday; did see blood in stool but thinks related to hemorrhoids; did take antibiotics approximately 1 month ago for foot infection;      Objective:  Vitals:   06/29/22 1345  BP: 118/68  Pulse: 70  Temp: 98 F (36.7 C)  TempSrc: Oral  SpO2: 95%  Weight: 172 lb 9.6 oz (78.3 kg)  Height: '5\' 11"'  (1.803 m)    General: Well developed, well nourished, in no acute distress  Skin : Warm and dry.  Head: Normocephalic and atraumatic  Lungs: Respirations unlabored;  Abdomen: Soft; nontender; nondistended; normoactive bowel sounds; no masses or hepatosplenomegaly  Neurologic: Alert and oriented; speech intact; face symmetrical; moves all extremities well; CNII-XII intact without focal deficit   Assessment:  1. Diarrhea, unspecified type   2. Bloody diarrhea     Plan:  Concern for C. Diff due to recent use of antibiotics; will update labs and stool culture; follow up to be determined.  BRAT diet, need for hydration discussed- continue Gatorade;   No follow-ups on file.  Orders Placed This Encounter  Procedures   Amylase   Lipase   CBC with Differential/Platelet   Comp Met (CMET)   GI Profile, Stool, PCR    Standing Status:    Future    Standing Expiration Date:   06/30/2023    Requested Prescriptions    No prescriptions requested or ordered in this encounter

## 2022-07-03 ENCOUNTER — Other Ambulatory Visit: Payer: Medicare Other

## 2022-07-03 ENCOUNTER — Other Ambulatory Visit: Payer: Self-pay | Admitting: *Deleted

## 2022-07-03 ENCOUNTER — Inpatient Hospital Stay: Payer: Medicare Other

## 2022-07-03 ENCOUNTER — Encounter: Payer: Self-pay | Admitting: *Deleted

## 2022-07-03 DIAGNOSIS — I82502 Chronic embolism and thrombosis of unspecified deep veins of left lower extremity: Secondary | ICD-10-CM

## 2022-07-03 DIAGNOSIS — Z7901 Long term (current) use of anticoagulants: Secondary | ICD-10-CM | POA: Diagnosis not present

## 2022-07-03 DIAGNOSIS — R197 Diarrhea, unspecified: Secondary | ICD-10-CM

## 2022-07-03 DIAGNOSIS — Z86718 Personal history of other venous thrombosis and embolism: Secondary | ICD-10-CM | POA: Diagnosis not present

## 2022-07-03 DIAGNOSIS — K921 Melena: Secondary | ICD-10-CM | POA: Diagnosis not present

## 2022-07-03 LAB — PROTIME-INR
INR: 1.7 — ABNORMAL HIGH (ref 0.8–1.2)
Prothrombin Time: 20.1 seconds — ABNORMAL HIGH (ref 11.4–15.2)

## 2022-07-03 MED ORDER — WARFARIN SODIUM 5 MG PO TABS: ORAL_TABLET | ORAL | 2 refills | Status: DC

## 2022-07-04 ENCOUNTER — Other Ambulatory Visit: Payer: Self-pay | Admitting: Family

## 2022-07-04 ENCOUNTER — Encounter: Payer: Self-pay | Admitting: Physical Therapy

## 2022-07-04 ENCOUNTER — Ambulatory Visit: Payer: Medicare Other | Admitting: Physical Therapy

## 2022-07-04 DIAGNOSIS — M25652 Stiffness of left hip, not elsewhere classified: Secondary | ICD-10-CM | POA: Diagnosis not present

## 2022-07-04 DIAGNOSIS — M6281 Muscle weakness (generalized): Secondary | ICD-10-CM

## 2022-07-04 DIAGNOSIS — R293 Abnormal posture: Secondary | ICD-10-CM | POA: Diagnosis not present

## 2022-07-04 DIAGNOSIS — G8929 Other chronic pain: Secondary | ICD-10-CM

## 2022-07-04 DIAGNOSIS — M542 Cervicalgia: Secondary | ICD-10-CM

## 2022-07-04 DIAGNOSIS — M545 Low back pain, unspecified: Secondary | ICD-10-CM | POA: Diagnosis not present

## 2022-07-04 LAB — GI PROFILE, STOOL, PCR

## 2022-07-04 MED ORDER — VANCOMYCIN HCL 125 MG PO CAPS
125.0000 mg | ORAL_CAPSULE | Freq: Four times a day (QID) | ORAL | 0 refills | Status: AC
Start: 2022-07-04 — End: 2022-07-14

## 2022-07-04 NOTE — Therapy (Signed)
OUTPATIENT PHYSICAL THERAPY THORACOLUMBAR TREATMENT   Patient Name: Raymond Eastridge Sr. MRN: [id number] DOB:Sep 03, 1951, 71 y.o., male Today's Date: 07/04/2022   PT End of Session - 07/04/22 1101     Visit Number 6    Date for PT Re-Evaluation 09/12/22    Authorization Type Medicare    PT Start Time 1100    PT Stop Time 1155    PT Time Calculation (min) 55 min    Activity Tolerance Patient tolerated treatment well    Behavior During Therapy WFL for tasks assessed/performed             Past Medical History:  Diagnosis Date   DVT (deep venous thrombosis) ([place])    GERD (gastroesophageal reflux disease)    Hyperlipidemia    Hypertension    Leg DVT (deep venous thromboembolism), chronic, left ([place]) 12/12/2021   Past Surgical History:  Procedure Laterality Date   EYE SURGERY     Patient Active Problem List   Diagnosis Date Noted   Pain of right lower extremity due to ischemia 05/01/2022   Leg DVT (deep venous thromboembolism), chronic, left ([place]) 12/12/2021   Mixed hyperlipidemia 11/21/2021   Low libido 11/21/2021   Low back pain 11/21/2021   Loss of appetite 11/21/2021   Hypertensive retinopathy 11/21/2021   Essential hypertension 11/21/2021   Chronic pain 11/21/2021   Enlarged prostate 06/20/2021   Degenerative disc disease, cervical 12/03/2020   Degenerative lumbar disc 09/27/2020   Degenerative arthritis of left knee 09/27/2020    PCP: Valere Dross, FNP  REFERRING PROVIDER: Creig Hines  REFERRING DIAG: Back pain and neck pain  Rationale for Evaluation and Treatment Rehabilitation  THERAPY DIAG:  Chronic bilateral low back pain without sciatica  Cervicalgia  Muscle weakness (generalized)  Stiffness of left hip, not elsewhere classified  Abnormal posture  ONSET DATE: 02/12/22  SUBJECTIVE:                                                                                                                                                                                            SUBJECTIVE STATEMENT: I am feeling better, less pain, will play tennis again tomorrow, has not played in about a month  PERTINENT HISTORY:  Multiple DVT, GERD  PAIN:  Are you having pain? Yes: NPRS scale: 3/10 Pain location: back and neck Pain description: ache, sore Aggravating factors: walking up stairs, tennis, sitting pain up to 7/10 with stooping and yardwork Relieving factors: Naprosyn pain can be 0/10 but I am very stiff   PRECAUTIONS: None  WEIGHT BEARING RESTRICTIONS No  FALLS:  Has patient fallen in last 6 months? Yes. Number of  falls 1  LIVING ENVIRONMENT: Lives with: lives with their family and lives alone Lives in: House/apartment Stairs: Yes: Internal: 12 steps; can reach both Has following equipment at home: None  OCCUPATION: retired  PLOF: Independent and plays tennis 2-3x/week, has not played recently due to the DVT  PATIENT GOALS have less pain, be stronger, continue tennis, be active, more flexible   OBJECTIVE:   DIAGNOSTIC FINDINGS:  HNP in the lumbar area  MUSCLE LENGTH: Hamstrings: Right 50 deg; Left 50 deg Very tight piriformis on the    POSTURE: rounded shoulders and forward head  PALPATION: Tight in the cervical area, the upper traps and the lumbar area  LUMBAR ROM:   all feel tight per patient,   Active  A/PROM  eval  Flexion Decreased 50%  Extension 50%  Right lateral flexion 50%  Left lateral flexion 50%  Right rotation   Left rotation    (Blank rows = not tested) Cervical ROM;  flexion WNL's, all other motions decreased 50% with c/o tightness  LOWER EXTREMITY ROM:   WFL's, shoulder WFL's  LOWER EXTREMITY MMT:  shoulders left is 4-/5 due to a RC injury in the left shoulder, LE's 4+/5  FUNCTIONAL TESTS:  Timed up and go (TUG): 15  GAIT: Reports fatigue with walking  miles fatigue  TODAY'S TREATMENT  07/04/22 Nustep level 5 x 6 minutes Rows and Lats 25# 2x10 10# leg extension 2x10 Leg curls 25#  3x10 Leg press 40# 3x10 Volleyball working on balance Passive stretch to bilateral LE's  06/28/22 NuStep Level 5 x 6 minutes Rows and Lats 25# 2x10 Leg extension 5# 2x10 Leg curls 25# 2x10 Leg PRess 20# 2x10 Feet on ball K2C, trunk rotation, small bridges, isometric abs Passive HS and piriformis stretches MHP/IFC to C/T area  06/26/22 Nustep level 5 x 6 minutes Rows and lats 25# 2 x10 each Leg curls 25# 2x10 Leg extension 5# 2x10 Leg press 30# 2x10 Passive stretch of HS and piriformis MHP/IFC to the CT area in supine   06/19/22 Rows & Lats 35lb 2x10 Recumbent Bike L3 x6 min AR press 20lb 2x10 Leg press 50lb 3x10  Shoulder Ext 10lb 2x10 HS curls 25lb 2x10 Leg Ext 10lb 2x10, RLE 5lb 2x10   LE on Pball bridges, K2C, Oblq  Hamstring stretch x3 10''  PATIENT EDUCATION:  Education details: HS and piriformis stretch Person educated: Patient Education method: Consulting civil engineer, Demonstration, and Verbal cues Education comprehension: verbalized understanding   HOME EXERCISE PROGRAM: As above  ASSESSMENT:  CLINICAL IMPRESSION: Patient reports that he is feeling better, reports that he is going to try to play tennis tomorrow for the first time in a month, He is still tight in the LE's but much better than when we first saw him, I gradually added back some exercises and strength and reps after last time him needing to back down some  OBJECTIVE IMPAIRMENTS Abnormal gait, cardiopulmonary status limiting activity, decreased activity tolerance, decreased balance, decreased endurance, decreased mobility, difficulty walking, decreased ROM, decreased strength, impaired perceived functional ability, increased muscle spasms, impaired flexibility, postural dysfunction, and pain.   REHAB POTENTIAL: Good  CLINICAL DECISION MAKING: Stable/uncomplicated  EVALUATION COMPLEXITY: Low   GOALS: Goals reviewed with patient? Yes  SHORT TERM GOALS: Target date: 06/27/22  Independent with initial  HEP Goal status: met LONG TERM GOALS: Target date: 09/04/22  Indepednetn with advanced HEP Goal status: ongoing  2.  Understand posture and body mechanics   Goal status: ongoing  3.  Decreae pain overall  50% Goal status: partially met  4.  Return to playing tennis without issue Goal status: on going    PLAN: PT FREQUENCY: 1-2x/week  PT DURATION: 12 weeks  PLANNED INTERVENTIONS: Therapeutic exercises, Therapeutic activity, Neuromuscular re-education, Balance training, Gait training, Patient/Family education, Self Care, Joint mobilization, Dry Needling, Electrical stimulation, Spinal mobilization, Cryotherapy, Moist heat, Taping, Traction, and Manual therapy.  PLAN FOR NEXT SESSION: see how is health is and advance as tolerated with his health issues   Sumner Boast, PT 07/04/2022, 11:01 AM

## 2022-07-05 ENCOUNTER — Telehealth: Payer: Self-pay

## 2022-07-05 ENCOUNTER — Ambulatory Visit: Payer: Self-pay | Admitting: Licensed Clinical Social Worker

## 2022-07-05 NOTE — Telephone Encounter (Signed)
Caller Name [facility] Phone Number [phone] Call Type Message Only Information Provided Reason for Call Returning a Call from the Office Initial Raymond [name] states he is returning a missed call from the office. Additional Comment Office hours provided. Disp. Time Disposition Final User 07/04/2022 7:05:00 PM General Information Provided Yes Raymond [name] Call Closed By: Raymond [name] Transaction Date/Time: 07/04/2022 7:02:21 PM (ET)

## 2022-07-05 NOTE — Patient Outreach (Signed)
  Care Coordination  Follow Up Visit Note   07/05/2022 Name: Raymond Wymore Sr. MRN: [id number] DOB: 1951/08/17  Raymond Like Sr. is a 71 y.o. year old male who sees Valere Dross, Marvis Repress, FNP for primary care. I spoke with  Raymond Like Sr. by phone today  What matters to the patients health and wellness today?  Dealing with acute medical condition Patient is making progress with connecting for grief support .   Recommendation: Patient may benefit from, and is in agreement to ; Continue to follow up with grief support group and individual therapy .  Discuss medication options with F NP to assist with managing symptoms of depression     Goals Addressed             This Visit's Progress    COMPLETED: Coping skills for stress & Grief       Care Coordination Interventions: Solution-Focused Strategies employed:  Active listening / Reflection utilized  Behavioral Activation reviewed Problem [place] strategies reviewed Provided psychoeducation for mental health needs for counseling        SDOH assessments and interventions completed:  Yes  SDOH Interventions Today    Flowsheet Row Most Recent Value  SDOH Interventions   SDOH Interventions for the Following Domains Depression, Stress  Stress Interventions Provide Counseling  Depression Interventions/Treatment  Counseling       Care Coordination Interventions Activated:  Yes  Care Coordination Interventions:  Yes, provided   Follow up plan:  No further intervention required.  Will call if needed  Encounter Outcome:  Pt. Visit Completed   Casimer Lanius, [place] [phone]

## 2022-07-05 NOTE — Telephone Encounter (Signed)
Pt called back regarding results- informed of stools results and recommendations. Pt verbalized understanding.

## 2022-07-05 NOTE — Patient Instructions (Signed)
Visit Information  Thank you for taking time to visit with me today. Please don't hesitate to contact me if I can be of assistance to you.   Following are the goals we discussed today:  Goals Addressed             This Visit's Progress    COMPLETED: Coping skills for stress & Grief       Care Coordination Interventions: Solution-Focused Strategies employed:  Active listening / Reflection utilized  Behavioral Activation reviewed Problem [place] strategies reviewed Provided psychoeducation for mental health needs for counseling        Please call the care guide team at [phone] if you need to schedule a phone appointment with me.   If you are experiencing a Mental Health or [place] or need someone to talk to, please call the Suicide and Crisis Lifeline: 988 call the [place] National Suicide Prevention Lifeline: (717)509-9873 or TTY: (339) 564-9143 TTY (509) 806-4628) to talk to a trained counselor call 1-800-273-TALK (toll free, 24 hour hotline) go to [facility] [address], [place] [phone])   Patient verbalizes understanding of instructions and care plan provided today and agrees to view in [place]. Active MyChart status and patient understanding of how to access instructions and care plan via MyChart confirmed with patient.     No further follow up required: by Care Coordination at this time. Per our conversation you will call me if needed.  Casimer Lanius, [place] [phone]

## 2022-07-06 NOTE — Telephone Encounter (Signed)
Spoke w/ Pt regarding results yesterday. See telephone note.

## 2022-07-06 NOTE — Telephone Encounter (Signed)
I have attempted to called the pt to relay the message below about his stool culture. No answer so I left a message to call back.  Message from provider:  He did have C. Diff- this was the infection that we discussed that is most likely related to antibiotics for his foot; will send in medicaiton called Vancomycin and hopefully he starts to feel better soon.   Medication has been sent to the pharmacy on 07/04/22.

## 2022-07-06 NOTE — Telephone Encounter (Signed)
Noted  

## 2022-07-10 ENCOUNTER — Ambulatory Visit: Payer: Medicare Other | Admitting: Physical Therapy

## 2022-07-10 ENCOUNTER — Encounter: Payer: Self-pay | Admitting: Physical Therapy

## 2022-07-10 DIAGNOSIS — M25652 Stiffness of left hip, not elsewhere classified: Secondary | ICD-10-CM

## 2022-07-10 DIAGNOSIS — M542 Cervicalgia: Secondary | ICD-10-CM

## 2022-07-10 DIAGNOSIS — R293 Abnormal posture: Secondary | ICD-10-CM

## 2022-07-10 DIAGNOSIS — G8929 Other chronic pain: Secondary | ICD-10-CM | POA: Diagnosis not present

## 2022-07-10 DIAGNOSIS — M6281 Muscle weakness (generalized): Secondary | ICD-10-CM | POA: Diagnosis not present

## 2022-07-10 DIAGNOSIS — M545 Low back pain, unspecified: Secondary | ICD-10-CM | POA: Diagnosis not present

## 2022-07-10 NOTE — Therapy (Signed)
OUTPATIENT PHYSICAL THERAPY THORACOLUMBAR TREATMENT   Patient Name: Raymond Cimo Sr. MRN: [id number] DOB:09/03/1951, 71 y.o., male Today's Date: 07/10/2022   PT End of Session - 07/10/22 1356     Visit Number 7    Date for PT Re-Evaluation 09/12/22    Authorization Type Medicare    PT Start Time 1355    PT Stop Time 8185    PT Time Calculation (min) 44 min    Activity Tolerance Patient tolerated treatment well    Behavior During Therapy WFL for tasks assessed/performed             Past Medical History:  Diagnosis Date   DVT (deep venous thrombosis) ([place])    GERD (gastroesophageal reflux disease)    Hyperlipidemia    Hypertension    Leg DVT (deep venous thromboembolism), chronic, left ([place]) 12/12/2021   Past Surgical History:  Procedure Laterality Date   EYE SURGERY     Patient Active Problem List   Diagnosis Date Noted   Pain of right lower extremity due to ischemia 05/01/2022   Leg DVT (deep venous thromboembolism), chronic, left ([place]) 12/12/2021   Mixed hyperlipidemia 11/21/2021   Low libido 11/21/2021   Low back pain 11/21/2021   Loss of appetite 11/21/2021   Hypertensive retinopathy 11/21/2021   Essential hypertension 11/21/2021   Chronic pain 11/21/2021   Enlarged prostate 06/20/2021   Degenerative disc disease, cervical 12/03/2020   Degenerative lumbar disc 09/27/2020   Degenerative arthritis of left knee 09/27/2020    PCP: Valere Dross, FNP  REFERRING PROVIDER: Creig Hines  REFERRING DIAG: Back pain and neck pain  Rationale for Evaluation and Treatment Rehabilitation  THERAPY DIAG:  Chronic bilateral low back pain without sciatica  Cervicalgia  Muscle weakness (generalized)  Stiffness of left hip, not elsewhere classified  Abnormal posture  ONSET DATE: 02/12/22  SUBJECTIVE:                                                                                                                                                                                            SUBJECTIVE STATEMENT: Doing pretty good, mild pain, but just feel stiff  PERTINENT HISTORY:  Multiple DVT, GERD  PAIN:  Are you having pain? Yes: NPRS scale: 1/10 Pain location: back and neck Pain description: ache, sore Aggravating factors: walking up stairs, tennis, sitting pain up to 7/10 with stooping and yardwork Relieving factors: Naprosyn pain can be 0/10 but I am very stiff   PRECAUTIONS: None  WEIGHT BEARING RESTRICTIONS No  FALLS:  Has patient fallen in last 6 months? Yes. Number of falls 1  LIVING ENVIRONMENT: Lives with: lives with  their family and lives alone Lives in: House/apartment Stairs: Yes: Internal: 12 steps; can reach both Has following equipment at home: None  OCCUPATION: retired  PLOF: Independent and plays tennis 2-3x/week, has not played recently due to the DVT  PATIENT GOALS have less pain, be stronger, continue tennis, be active, more flexible   OBJECTIVE:   DIAGNOSTIC FINDINGS:  HNP in the lumbar area  MUSCLE LENGTH: Hamstrings: Right 50 deg; Left 50 deg Very tight piriformis on the    POSTURE: rounded shoulders and forward head  PALPATION: Tight in the cervical area, the upper traps and the lumbar area  LUMBAR ROM:   all feel tight per patient,   Active  A/PROM  eval  Flexion Decreased 50%  Extension 50%  Right lateral flexion 50%  Left lateral flexion 50%  Right rotation   Left rotation    (Blank rows = not tested) Cervical ROM;  flexion WNL's, all other motions decreased 50% with c/o tightness  LOWER EXTREMITY ROM:   WFL's, shoulder WFL's  LOWER EXTREMITY MMT:  shoulders left is 4-/5 due to a RC injury in the left shoulder, LE's 4+/5  FUNCTIONAL TESTS:  Timed up and go (TUG): 15  GAIT: Reports fatigue with walking  miles fatigue  TODAY'S TREATMENT  07/10/22 Nustep level 5 x 6 minutes Leg extension 10# 3x10 Leg curls 25# 3x10 Triceps 25# 2x10 cues for core 10# biceps 2x10 5# hip extension  and abduction Seated rows 25# 2x10 Lats 25# 2x10 Feet on ball K2C, trunk rotation, small bridges and isometric abs Passive LE stretches   07/04/22 Nustep level 5 x 6 minutes Rows and Lats 25# 2x10 10# leg extension 2x10 Leg curls 25# 3x10 Leg press 40# 3x10 Volleyball working on balance Passive stretch to bilateral LE's  06/28/22 NuStep Level 5 x 6 minutes Rows and Lats 25# 2x10 Leg extension 5# 2x10 Leg curls 25# 2x10 Leg PRess 20# 2x10 Feet on ball K2C, trunk rotation, small bridges, isometric abs Passive HS and piriformis stretches MHP/IFC to C/T area  06/26/22 Nustep level 5 x 6 minutes Rows and lats 25# 2 x10 each Leg curls 25# 2x10 Leg extension 5# 2x10 Leg press 30# 2x10 Passive stretch of HS and piriformis MHP/IFC to the CT area in supine   06/19/22 Rows & Lats 35lb 2x10 Recumbent Bike L3 x6 min AR press 20lb 2x10 Leg press 50lb 3x10  Shoulder Ext 10lb 2x10 HS curls 25lb 2x10 Leg Ext 10lb 2x10, RLE 5lb 2x10   LE on Pball bridges, K2C, Oblq  Hamstring stretch x3 10''  PATIENT EDUCATION:  Education details: HS and piriformis stretch Person educated: Patient Education method: Consulting civil engineer, Demonstration, and Verbal cues Education comprehension: verbalized understanding   HOME EXERCISE PROGRAM: As above  ASSESSMENT:  CLINICAL IMPRESSION: I added some arms with cues for core activation, added hip exercises which was tough on him, will go back to some balance next visit see how he is doing  OBJECTIVE IMPAIRMENTS Abnormal gait, cardiopulmonary status limiting activity, decreased activity tolerance, decreased balance, decreased endurance, decreased mobility, difficulty walking, decreased ROM, decreased strength, impaired perceived functional ability, increased muscle spasms, impaired flexibility, postural dysfunction, and pain.   REHAB POTENTIAL: Good  CLINICAL DECISION MAKING: Stable/uncomplicated  EVALUATION COMPLEXITY: Low   GOALS: Goals reviewed  with patient? Yes  SHORT TERM GOALS: Target date: 06/27/22  Independent with initial HEP Goal status: met LONG TERM GOALS: Target date: 09/04/22  Indepednetn with advanced HEP Goal status: ongoing  2.  Understand posture and  body mechanics   Goal status: ongoing  3.  Decreae pain overall 50% Goal status: partially met  4.  Return to playing tennis without issue Goal status: on going    PLAN: PT FREQUENCY: 1-2x/week  PT DURATION: 12 weeks  PLANNED INTERVENTIONS: Therapeutic exercises, Therapeutic activity, Neuromuscular re-education, Balance training, Gait training, Patient/Family education, Self Care, Joint mobilization, Dry Needling, Electrical stimulation, Spinal mobilization, Cryotherapy, Moist heat, Taping, Traction, and Manual therapy.  PLAN FOR NEXT SESSION: see how is health is and advance as tolerated with his health issues   Sumner Boast, PT 07/10/2022, 1:58 PM

## 2022-07-11 ENCOUNTER — Inpatient Hospital Stay: Payer: Medicare Other

## 2022-07-11 DIAGNOSIS — I82502 Chronic embolism and thrombosis of unspecified deep veins of left lower extremity: Secondary | ICD-10-CM

## 2022-07-11 DIAGNOSIS — Z86718 Personal history of other venous thrombosis and embolism: Secondary | ICD-10-CM | POA: Diagnosis not present

## 2022-07-11 DIAGNOSIS — Z7901 Long term (current) use of anticoagulants: Secondary | ICD-10-CM | POA: Diagnosis not present

## 2022-07-11 LAB — CBC WITH DIFFERENTIAL (CANCER CENTER ONLY)
Abs Immature Granulocytes: 0.02 10*3/uL (ref 0.00–0.07)
Basophils Absolute: 0.1 10*3/uL (ref 0.0–0.1)
Basophils Relative: 1 %
Eosinophils Absolute: 0.2 10*3/uL (ref 0.0–0.5)
Eosinophils Relative: 2 %
HCT: 44.3 % (ref 39.0–52.0)
Hemoglobin: 14.2 g/dL (ref 13.0–17.0)
Immature Granulocytes: 0 %
Lymphocytes Relative: 29 %
Lymphs Abs: 2.1 10*3/uL (ref 0.7–4.0)
MCH: 31.4 pg (ref 26.0–34.0)
MCHC: 32.1 g/dL (ref 30.0–36.0)
MCV: 98 fL (ref 80.0–100.0)
Monocytes Absolute: 0.7 10*3/uL (ref 0.1–1.0)
Monocytes Relative: 9 %
Neutro Abs: 4.2 10*3/uL (ref 1.7–7.7)
Neutrophils Relative %: 59 %
Platelet Count: 273 10*3/uL (ref 150–400)
RBC: 4.52 MIL/uL (ref 4.22–5.81)
RDW: 14.2 % (ref 11.5–15.5)
WBC Count: 7.2 10*3/uL (ref 4.0–10.5)
nRBC: 0 % (ref 0.0–0.2)

## 2022-07-11 LAB — D-DIMER, QUANTITATIVE: D-Dimer, Quant: 0.47 ug/mL-FEU (ref 0.00–0.50)

## 2022-07-11 LAB — CMP (CANCER CENTER ONLY)
ALT: 21 U/L (ref 0–44)
AST: 25 U/L (ref 15–41)
Albumin: 4.1 g/dL (ref 3.5–5.0)
Alkaline Phosphatase: 88 U/L (ref 38–126)
Anion gap: 8 (ref 5–15)
BUN: 19 mg/dL (ref 8–23)
CO2: 28 mmol/L (ref 22–32)
Calcium: 9.2 mg/dL (ref 8.9–10.3)
Chloride: 103 mmol/L (ref 98–111)
Creatinine: 0.93 mg/dL (ref 0.61–1.24)
GFR, Estimated: >60 mL/min (ref >60)
Glucose, Bld: 90 mg/dL (ref 70–99)
Potassium: 3.9 mmol/L (ref 3.5–5.1)
Sodium: 139 mmol/L (ref 135–145)
Total Bilirubin: 0.9 mg/dL (ref 0.3–1.2)
Total Protein: 7.2 g/dL (ref 6.5–8.1)

## 2022-07-12 NOTE — Progress Notes (Signed)
[name] [place] [address] [place] Phone: [phone] Subjective:   IVilma [name], am serving as a scribe for Dr. Hulan Saas.  I'm seeing this patient by the request  of:  Marrian Salvage, FNP  CC: back pain follow up   VVZ:[id number]  05/30/2022 Seems to be improving at this time.  We will continue to monitor.  Still Tylenol would be better anti-inflammatories with patient being on the anticoagulation.  Continues to see oncology, is going to be following up with vascular in the near future as well.  Patient is concerned for the potential cost of the anticoagulant medication and due to having 2 DVTs relatively quickly would patient also be a possible candidate for a filter.  Patient does have degenerative disc disease lumbar spine.  We discussed the possibility of having elevated injections that patient wanted.  Unfortunately secondary to him being on a blood thinner and this could be more complicated at the moment.  I would like him to restart with formal physical therapy.  He can continue to get continue.  Discussed with patient that I do feel that further laboratory work-up is necessary potentially for the vascular pathology.  Patient is scheduled to see the vascular surgeon as well as see his primary care physician in the near future.  I do think an autoimmune work-up, B12, PSA and ESR are also necessary at this time to further evaluate for anything that would be contributing.  Follow-up with me otherwise in 6 to 8 weeks to see how patient is doing with the conservative therapy for his back.  Patient is scheduled for nerve conduction study which will tell us to see if there is more of a peripheral neuropathy versus the possibility of lumbar radiculopathy that could be contributing to some of his leg pain in addition to the vascular compromise.  Updated 07/19/2022 Raymond Like Sr. is a 71 y.o. male coming in with complaint of back  pain. Pain is getting better. More on the milder side. Has changed his diet a bit and is playing tennis again. Wants to talk about the nerve conduction study and if he needs to get it because hes getting better.       Past Medical History:  Diagnosis Date   DVT (deep venous thrombosis) ([facility])    GERD (gastroesophageal reflux disease)    Hyperlipidemia    Hypertension    Leg DVT (deep venous thromboembolism), chronic, left ([place]) 12/12/2021   Past Surgical History:  Procedure Laterality Date   EYE SURGERY     Social History   Socioeconomic History   Marital status: Widowed    Spouse name: Not on file   Number of children: Not on file   Years of education: Not on file   Highest education level: Not on file  Occupational History   Not on file  Tobacco Use   Smoking status: Never   Smokeless tobacco: Not on file  Substance and Sexual Activity   Alcohol use: Not on file   Drug use: Not on file   Sexual activity: Not on file  Other Topics Concern   Not on file  Social History Narrative   Not on file   Social Determinants of Health   Financial Resource Strain: Medium Risk (06/01/2022)   Overall Financial Resource Strain (CARDIA)    Difficulty of Paying Living Expenses: Somewhat hard  Food Insecurity: No Food Insecurity (06/01/2022)   Hunger Vital Sign  Worried About Charity fundraiser in the Last Year: Never true    [place] in the Last Year: Never true  Transportation Needs: No Transportation Needs (06/01/2022)   PRAPARE - Hydrologist (Medical): No    Lack of Transportation (Non-Medical): No  Physical Activity: Sufficiently Active (06/01/2022)   Exercise Vital Sign    Days of Exercise per Week: 7 days    Minutes of Exercise per Session: 60 min  Stress: Stress Concern Present (06/01/2022)   [place]    Feeling of Stress : Rather much  Social Connections: Moderately  Isolated (06/01/2022)   Social Connection and Isolation Panel [NHANES]    Frequency of Communication with Friends and Family: More than three times a week    Frequency of Social Gatherings with Friends and Family: More than three times a week    Attends Religious Services: Never    Marine scientist or Organizations: Yes    Attends Music therapist: More than 4 times per year    Marital Status: Widowed   Allergies  Allergen Reactions   Penicillins Hives and Rash    Severe Rash    Penicillin V Potassium Other (See Comments)   Family History  Problem Relation Age of Onset   Alcohol abuse Mother    Arthritis Mother    Diabetes Mother    Mental retardation Mother    Alcohol abuse Father    Hypertension Father    Cancer Sister    Depression Sister    Cancer Sister    Arthritis Maternal Grandmother    Parkinson's disease Maternal Grandfather      Current Outpatient Medications (Cardiovascular):    atorvastatin (LIPITOR) 10 MG tablet, Take 1 tablet (10 mg total) by mouth daily.   hydrochlorothiazide (MICROZIDE) 12.5 MG capsule, TAKE ONE CAPSULE BY MOUTH ONE TIME DAILY   losartan (COZAAR) 50 MG tablet, TAKE ONE TABLET BY MOUTH ONE TIME DAILY   nitroGLYCERIN (NITROGLYN) 2 % ointment, Apply 0.5 inches topically 4 (four) times daily.   Current Outpatient Medications (Analgesics):    naproxen sodium (ALEVE) 220 MG tablet, Take 220 mg by mouth as needed. Takes before playing tennis.  Current Outpatient Medications (Hematological):    warfarin (COUMADIN) 5 MG tablet, 07/03/22: Please take Coumadin 26m by mouth for two days in a row, on the 3rd day take 559m Repeat this schedule until next lab check.  Current Outpatient Medications (Other):    cholecalciferol (VITAMIN D3) 25 MCG (1000 UNIT) tablet, Take 1,000 Units by mouth daily.   fluoruracil (FLUOROPLEX) 1 % cream, 1 application to affected area   gabapentin (NEURONTIN) 100 MG capsule, Take 200 mg by mouth  daily.   hydrocortisone cream 1 %, Apply 1 application topically 2 (two) times daily.   Review of Systems:  No headache, visual changes, nausea, vomiting, diarrhea, constipation, dizziness, abdominal pain, skin rash, fevers, chills, night sweats, weight loss, swollen lymph nodes,  joint swelling, chest pain, shortness of breath, mood changes. POSITIVE muscle aches, body aches   Objective  Blood pressure 138/86, pulse 75, height '5\' 11"'  (1.803 m), weight 172 lb (78 kg), SpO2 96 %.   General: No apparent distress alert and oriented x3 mood and affect normal, dressed appropriately.  HEENT: Pupils equal, extraocular movements intact  Respiratory: Patient's speak in full sentences and does not appear short of breath  Cardiovascular: No lower extremity edema, non tender, no  erythema  Sitting comfortably.      Impression and Recommendations:     The above documentation has been reviewed and is accurate and complete Lyndal Pulley, DO

## 2022-07-18 ENCOUNTER — Encounter: Payer: Self-pay | Admitting: Physical Therapy

## 2022-07-18 ENCOUNTER — Encounter: Payer: Self-pay | Admitting: Podiatry

## 2022-07-18 ENCOUNTER — Ambulatory Visit: Payer: Medicare Other | Attending: Family Medicine | Admitting: Physical Therapy

## 2022-07-18 DIAGNOSIS — G8929 Other chronic pain: Secondary | ICD-10-CM | POA: Insufficient documentation

## 2022-07-18 DIAGNOSIS — M25652 Stiffness of left hip, not elsewhere classified: Secondary | ICD-10-CM | POA: Diagnosis not present

## 2022-07-18 DIAGNOSIS — R29898 Other symptoms and signs involving the musculoskeletal system: Secondary | ICD-10-CM | POA: Insufficient documentation

## 2022-07-18 DIAGNOSIS — R293 Abnormal posture: Secondary | ICD-10-CM | POA: Diagnosis not present

## 2022-07-18 DIAGNOSIS — M545 Low back pain, unspecified: Secondary | ICD-10-CM | POA: Diagnosis not present

## 2022-07-18 DIAGNOSIS — M542 Cervicalgia: Secondary | ICD-10-CM | POA: Insufficient documentation

## 2022-07-18 DIAGNOSIS — M6281 Muscle weakness (generalized): Secondary | ICD-10-CM | POA: Insufficient documentation

## 2022-07-18 NOTE — Therapy (Signed)
OUTPATIENT PHYSICAL THERAPY THORACOLUMBAR TREATMENT   Patient Name: Raymond Mamula Sr. MRN: [id number] DOB:1951-07-02, 71 y.o., male Today's Date: 07/18/2022   PT End of Session - 07/18/22 1529     Visit Number 8    Date for PT Re-Evaluation 09/12/22    Authorization Type Medicare    PT Start Time 1526    PT Stop Time 8938    PT Time Calculation (min) 47 min    Activity Tolerance Patient tolerated treatment well    Behavior During Therapy WFL for tasks assessed/performed             Past Medical History:  Diagnosis Date   DVT (deep venous thrombosis) ([place])    GERD (gastroesophageal reflux disease)    Hyperlipidemia    Hypertension    Leg DVT (deep venous thromboembolism), chronic, left ([place]) 12/12/2021   Past Surgical History:  Procedure Laterality Date   EYE SURGERY     Patient Active Problem List   Diagnosis Date Noted   Pain of right lower extremity due to ischemia 05/01/2022   Leg DVT (deep venous thromboembolism), chronic, left ([place]) 12/12/2021   Mixed hyperlipidemia 11/21/2021   Low libido 11/21/2021   Low back pain 11/21/2021   Loss of appetite 11/21/2021   Hypertensive retinopathy 11/21/2021   Essential hypertension 11/21/2021   Chronic pain 11/21/2021   Enlarged prostate 06/20/2021   Degenerative disc disease, cervical 12/03/2020   Degenerative lumbar disc 09/27/2020   Degenerative arthritis of left knee 09/27/2020    PCP: Valere Dross, FNP  REFERRING PROVIDER: Creig Hines  REFERRING DIAG: Back pain and neck pain  Rationale for Evaluation and Treatment Rehabilitation  THERAPY DIAG:  Chronic bilateral low back pain without sciatica  Cervicalgia  Muscle weakness (generalized)  Stiffness of left hip, not elsewhere classified  Abnormal posture  Other symptoms and signs involving the musculoskeletal system  ONSET DATE: 02/12/22  SUBJECTIVE:                                                                                                                                                                                            SUBJECTIVE STATEMENT: Able to play tennis, mild back tightness, some left shoulder pain  PERTINENT HISTORY:  Multiple DVT, GERD  PAIN:  Are you having pain? Yes: NPRS scale: 1/10 Pain location: back and neck Pain description: ache, sore Aggravating factors: walking up stairs, tennis, sitting pain up to 7/10 with stooping and yardwork Relieving factors: Naprosyn pain can be 0/10 but I am very stiff   PRECAUTIONS: None  WEIGHT BEARING RESTRICTIONS No  FALLS:  Has patient fallen in last 6 months? Yes.  Number of falls 1  LIVING ENVIRONMENT: Lives with: lives with their family and lives alone Lives in: House/apartment Stairs: Yes: Internal: 12 steps; can reach both Has following equipment at home: None  OCCUPATION: retired  PLOF: Independent and plays tennis 2-3x/week, has not played recently due to the DVT  PATIENT GOALS have less pain, be stronger, continue tennis, be active, more flexible   OBJECTIVE:   DIAGNOSTIC FINDINGS:  HNP in the lumbar area  MUSCLE LENGTH: Hamstrings: Right 50 deg; Left 50 deg Very tight piriformis on the    POSTURE: rounded shoulders and forward head  PALPATION: Tight in the cervical area, the upper traps and the lumbar area  LUMBAR ROM:   all feel tight per patient,   Active  A/PROM  eval  Flexion Decreased 50%  Extension 50%  Right lateral flexion 50%  Left lateral flexion 50%  Right rotation   Left rotation    (Blank rows = not tested) Cervical ROM;  flexion WNL's, all other motions decreased 50% with c/o tightness  LOWER EXTREMITY ROM:   WFL's, shoulder WFL's  LOWER EXTREMITY MMT:  shoulders left is 4-/5 due to a RC injury in the left shoulder, LE's 4+/5  FUNCTIONAL TESTS:  Timed up and go (TUG): 15  GAIT: Reports fatigue with walking  miles fatigue  TODAY'S TREATMENT  07/18/22 Nustep level 6 x 3 minutes , level 5 x 3 minutes Straight  arm pulls 10# cues for core and posture 5# hip extension and abduction 2 x10 Leg extension 10# Leg curls 25# Seated Row 25# Lats 25# Black tband back extension Leg press 40# 3x10 Passive LE stretches  8/288/23 Nustep level 5 x 6 minutes Leg extension 10# 3x10 Leg curls 25# 3x10 Triceps 25# 2x10 cues for core 10# biceps 2x10 5# hip extension and abduction Seated rows 25# 2x10 Lats 25# 2x10 Feet on ball K2C, trunk rotation, small bridges and isometric abs Passive LE stretches   07/04/22 Nustep level 5 x 6 minutes Rows and Lats 25# 2x10 10# leg extension 2x10 Leg curls 25# 3x10 Leg press 40# 3x10 Volleyball working on balance Passive stretch to bilateral LE's  06/28/22 NuStep Level 5 x 6 minutes Rows and Lats 25# 2x10 Leg extension 5# 2x10 Leg curls 25# 2x10 Leg PRess 20# 2x10 Feet on ball K2C, trunk rotation, small bridges, isometric abs Passive HS and piriformis stretches MHP/IFC to C/T area  PATIENT EDUCATION:  Education details: HS and piriformis stretch Person educated: Patient Education method: Consulting civil engineer, Demonstration, and Verbal cues Education comprehension: verbalized understanding   HOME EXERCISE PROGRAM: As above  ASSESSMENT:  CLINICAL IMPRESSION: We continue to add exercises for strength and stability, he has been able to play tennis and reports minimal to no increase of back or neck pain.  I have added ITB and hip flexor, quad stretches   OBJECTIVE IMPAIRMENTS Abnormal gait, cardiopulmonary status limiting activity, decreased activity tolerance, decreased balance, decreased endurance, decreased mobility, difficulty walking, decreased ROM, decreased strength, impaired perceived functional ability, increased muscle spasms, impaired flexibility, postural dysfunction, and pain.   REHAB POTENTIAL: Good  CLINICAL DECISION MAKING: Stable/uncomplicated  EVALUATION COMPLEXITY: Low   GOALS: Goals reviewed with patient? Yes  SHORT TERM GOALS:  Target date: 06/27/22  Independent with initial HEP Goal status: met LONG TERM GOALS: Target date: 09/04/22  Indepednetn with advanced HEP Goal status: ongoing  2.  Understand posture and body mechanics   Goal status: ongoing  3.  Decreae pain overall 50% Goal status: partially met  4.  Return to playing tennis without issue Goal status: on going    PLAN: PT FREQUENCY: 1-2x/week  PT DURATION: 12 weeks  PLANNED INTERVENTIONS: Therapeutic exercises, Therapeutic activity, Neuromuscular re-education, Balance training, Gait training, Patient/Family education, Self Care, Joint mobilization, Dry Needling, Electrical stimulation, Spinal mobilization, Cryotherapy, Moist heat, Taping, Traction, and Manual therapy.  PLAN FOR NEXT SESSION: see how is health is and advance as tolerated with his health issues   Sumner Boast, PT 07/18/2022, 3:30 PM

## 2022-07-19 ENCOUNTER — Ambulatory Visit (INDEPENDENT_AMBULATORY_CARE_PROVIDER_SITE_OTHER): Payer: Medicare Other | Admitting: Family Medicine

## 2022-07-19 DIAGNOSIS — M5136 Other intervertebral disc degeneration, lumbar region: Secondary | ICD-10-CM

## 2022-07-19 DIAGNOSIS — I743 Embolism and thrombosis of arteries of the lower extremities: Secondary | ICD-10-CM

## 2022-07-19 NOTE — Patient Instructions (Addendum)
Good to see you Glad you are better Ice shoulder after activity for 20 min See me in 3 months if not perfect

## 2022-07-19 NOTE — Assessment & Plan Note (Signed)
Discussed with patient again for good amount of time.  Discussed the possibility of gabapentin.  We also discussed the nerve conduction study.  Patient wants to hold on the test.  Patient does not want to do the nerve conduction test.  We are trying to decide how much of this would be neurologic compared to the vascular patient is having trouble.  He is making improvement at this point.  Follow-up with me again in 3 months otherwise.  Worsening pain will get the nerve conduction study.

## 2022-07-21 ENCOUNTER — Ambulatory Visit: Payer: Medicare Other | Admitting: Podiatry

## 2022-07-25 ENCOUNTER — Encounter: Payer: Self-pay | Admitting: Physical Therapy

## 2022-07-25 ENCOUNTER — Ambulatory Visit: Payer: Medicare Other | Admitting: Physical Therapy

## 2022-07-25 DIAGNOSIS — M545 Low back pain, unspecified: Secondary | ICD-10-CM

## 2022-07-25 DIAGNOSIS — M6281 Muscle weakness (generalized): Secondary | ICD-10-CM

## 2022-07-25 DIAGNOSIS — R293 Abnormal posture: Secondary | ICD-10-CM

## 2022-07-25 DIAGNOSIS — M542 Cervicalgia: Secondary | ICD-10-CM

## 2022-07-25 DIAGNOSIS — G8929 Other chronic pain: Secondary | ICD-10-CM | POA: Diagnosis not present

## 2022-07-25 DIAGNOSIS — M25652 Stiffness of left hip, not elsewhere classified: Secondary | ICD-10-CM

## 2022-07-25 NOTE — Therapy (Signed)
OUTPATIENT PHYSICAL THERAPY THORACOLUMBAR TREATMENT   Patient Name: Raymond Ernest Sr. MRN: [id number] DOB:Mar 08, 1951, 71 y.o., male Today's Date: 07/25/2022   PT End of Session - 07/25/22 1017     Visit Number 9    Date for PT Re-Evaluation 09/12/22    Authorization Type Medicare    PT Start Time 1014    PT Stop Time 1100    PT Time Calculation (min) 46 min    Activity Tolerance Patient tolerated treatment well    Behavior During Therapy WFL for tasks assessed/performed             Past Medical History:  Diagnosis Date   DVT (deep venous thrombosis) ([place])    GERD (gastroesophageal reflux disease)    Hyperlipidemia    Hypertension    Leg DVT (deep venous thromboembolism), chronic, left ([place]) 12/12/2021   Past Surgical History:  Procedure Laterality Date   EYE SURGERY     Patient Active Problem List   Diagnosis Date Noted   Pain of right lower extremity due to ischemia 05/01/2022   Leg DVT (deep venous thromboembolism), chronic, left ([place]) 12/12/2021   Mixed hyperlipidemia 11/21/2021   Low libido 11/21/2021   Low back pain 11/21/2021   Loss of appetite 11/21/2021   Hypertensive retinopathy 11/21/2021   Essential hypertension 11/21/2021   Chronic pain 11/21/2021   Enlarged prostate 06/20/2021   Degenerative disc disease, cervical 12/03/2020   Degenerative lumbar disc 09/27/2020   Degenerative arthritis of left knee 09/27/2020    PCP: Valere Dross, FNP  REFERRING PROVIDER: Creig Hines  REFERRING DIAG: Back pain and neck pain  Rationale for Evaluation and Treatment Rehabilitation  THERAPY DIAG:  Chronic bilateral low back pain without sciatica  Cervicalgia  Muscle weakness (generalized)  Stiffness of left hip, not elsewhere classified  Abnormal posture  ONSET DATE: 02/12/22  SUBJECTIVE:                                                                                                                                                                                            SUBJECTIVE STATEMENT: Stiff and sore but overall feeling a little better  PERTINENT HISTORY:  Multiple DVT, GERD  PAIN:  Are you having pain? Yes: NPRS scale: 1/10 Pain location: back and neck Pain description: ache, sore Aggravating factors: walking up stairs, tennis, sitting pain up to 7/10 with stooping and yardwork Relieving factors: Naprosyn pain can be 0/10 but I am very stiff   PRECAUTIONS: None  WEIGHT BEARING RESTRICTIONS No  FALLS:  Has patient fallen in last 6 months? Yes. Number of falls 1  LIVING ENVIRONMENT: Lives with: lives with  their family and lives alone Lives in: House/apartment Stairs: Yes: Internal: 12 steps; can reach both Has following equipment at home: None  OCCUPATION: retired  PLOF: Independent and plays tennis 2-3x/week, has not played recently due to the DVT  PATIENT GOALS have less pain, be stronger, continue tennis, be active, more flexible   OBJECTIVE:   DIAGNOSTIC FINDINGS:  HNP in the lumbar area  MUSCLE LENGTH: Hamstrings: Right 50 deg; Left 50 deg Very tight piriformis on the    POSTURE: rounded shoulders and forward head  PALPATION: Tight in the cervical area, the upper traps and the lumbar area  LUMBAR ROM:   all feel tight per patient,   Active  A/PROM  eval  Flexion Decreased 50%  Extension 50%  Right lateral flexion 50%  Left lateral flexion 50%  Right rotation   Left rotation    (Blank rows = not tested) Cervical ROM;  flexion WNL's, all other motions decreased 50% with c/o tightness  LOWER EXTREMITY ROM:   WFL's, shoulder WFL's  LOWER EXTREMITY MMT:  shoulders left is 4-/5 due to a RC injury in the left shoulder, LE's 4+/5  FUNCTIONAL TESTS:  Timed up and go (TUG): 15  GAIT: Reports fatigue with walking  miles fatigue  TODAY'S TREATMENT  07/25/22 Bike Level 4 x 6 minutes Nustep level 6 x 6 minutes LEg press 40# 3x10 Leg curls 25# 3x10, then right only 15# Resisted gait 40# all  directions 10# straight arm pulls 20# lats 2x10 Passive ROM LE stretches   07/18/22 Nustep level 6 x 3 minutes , level 5 x 3 minutes Straight arm pulls 10# cues for core and posture 5# hip extension and abduction 2 x10 Leg extension 10# Leg curls 25# Seated Row 25# Lats 25# Black tband back extension Leg press 40# 3x10 Passive LE stretches  8/288/23 Nustep level 5 x 6 minutes Leg extension 10# 3x10 Leg curls 25# 3x10 Triceps 25# 2x10 cues for core 10# biceps 2x10 5# hip extension and abduction Seated rows 25# 2x10 Lats 25# 2x10 Feet on ball K2C, trunk rotation, small bridges and isometric abs Passive LE stretches   07/04/22 Nustep level 5 x 6 minutes Rows and Lats 25# 2x10 10# leg extension 2x10 Leg curls 25# 3x10 Leg press 40# 3x10 Volleyball working on balance Passive stretch to bilateral LE's  06/28/22 NuStep Level 5 x 6 minutes Rows and Lats 25# 2x10 Leg extension 5# 2x10 Leg curls 25# 2x10 Leg PRess 20# 2x10 Feet on ball K2C, trunk rotation, small bridges, isometric abs Passive HS and piriformis stretches MHP/IFC to C/T area  PATIENT EDUCATION:  Education details: HS and piriformis stretch Person educated: Patient Education method: Consulting civil engineer, Demonstration, and Verbal cues Education comprehension: verbalized understanding   HOME EXERCISE PROGRAM: As above  ASSESSMENT:  CLINICAL IMPRESSION: Patietn doing well, still playing tennis and has not had much problems, has a little soreness and pain in the back  after tennis.  He is still very tight in the LE especially the left.  OBJECTIVE IMPAIRMENTS Abnormal gait, cardiopulmonary status limiting activity, decreased activity tolerance, decreased balance, decreased endurance, decreased mobility, difficulty walking, decreased ROM, decreased strength, impaired perceived functional ability, increased muscle spasms, impaired flexibility, postural dysfunction, and pain.   REHAB POTENTIAL: Good  CLINICAL  DECISION MAKING: Stable/uncomplicated  EVALUATION COMPLEXITY: Low   GOALS: Goals reviewed with patient? Yes  SHORT TERM GOALS: Target date: 06/27/22  Independent with initial HEP Goal status: met LONG TERM GOALS: Target date: 09/04/22  Indepednetn with  advanced HEP Goal status: ongoing  2.  Understand posture and body mechanics   Goal status: ongoing  3.  Decreae pain overall 50% Goal status: partially met  4.  Return to playing tennis without issue Goal status: on going    PLAN: PT FREQUENCY: 1-2x/week  PT DURATION: 12 weeks  PLANNED INTERVENTIONS: Therapeutic exercises, Therapeutic activity, Neuromuscular re-education, Balance training, Gait training, Patient/Family education, Self Care, Joint mobilization, Dry Needling, Electrical stimulation, Spinal mobilization, Cryotherapy, Moist heat, Taping, Traction, and Manual therapy.  PLAN FOR NEXT SESSION: see how is health is and advance as tolerated with his health issues   Sumner Boast, PT 07/25/2022, 10:18 AM

## 2022-07-31 ENCOUNTER — Encounter: Payer: Self-pay | Admitting: Physical Therapy

## 2022-07-31 ENCOUNTER — Ambulatory Visit: Payer: Medicare Other | Admitting: Physical Therapy

## 2022-07-31 DIAGNOSIS — M6281 Muscle weakness (generalized): Secondary | ICD-10-CM

## 2022-07-31 DIAGNOSIS — G8929 Other chronic pain: Secondary | ICD-10-CM

## 2022-07-31 DIAGNOSIS — M25652 Stiffness of left hip, not elsewhere classified: Secondary | ICD-10-CM

## 2022-07-31 DIAGNOSIS — R29898 Other symptoms and signs involving the musculoskeletal system: Secondary | ICD-10-CM

## 2022-07-31 DIAGNOSIS — R293 Abnormal posture: Secondary | ICD-10-CM

## 2022-07-31 DIAGNOSIS — M545 Low back pain, unspecified: Secondary | ICD-10-CM | POA: Diagnosis not present

## 2022-07-31 DIAGNOSIS — M542 Cervicalgia: Secondary | ICD-10-CM

## 2022-07-31 NOTE — Therapy (Signed)
OUTPATIENT PHYSICAL THERAPY THORACOLUMBAR TREATMENT   Patient Name: Raymond Gildersleeve Sr. MRN: [id number] DOB:1951-03-20, 71 y.o., male Today's Date: 07/31/2022  Progress Note  Reporting Period 06/12/22 to 07/31/22  See note below for Objective Data and Assessment of Progress/Goals.       PT End of Session - 07/31/22 0958     Visit Number 10    Date for PT Re-Evaluation 09/12/22    Authorization Type Medicare    Progress Note Due on Visit 9    PT Start Time 0933    PT Stop Time 1011    PT Time Calculation (min) 38 min    Activity Tolerance Patient tolerated treatment well    Behavior During Therapy WFL for tasks assessed/performed              Past Medical History:  Diagnosis Date   DVT (deep venous thrombosis) ([facility])    GERD (gastroesophageal reflux disease)    Hyperlipidemia    Hypertension    Leg DVT (deep venous thromboembolism), chronic, left ([place]) 12/12/2021   Past Surgical History:  Procedure Laterality Date   EYE SURGERY     Patient Active Problem List   Diagnosis Date Noted   Pain of right lower extremity due to ischemia 05/01/2022   Leg DVT (deep venous thromboembolism), chronic, left ([place]) 12/12/2021   Mixed hyperlipidemia 11/21/2021   Low libido 11/21/2021   Low back pain 11/21/2021   Loss of appetite 11/21/2021   Hypertensive retinopathy 11/21/2021   Essential hypertension 11/21/2021   Chronic pain 11/21/2021   Enlarged prostate 06/20/2021   Degenerative disc disease, cervical 12/03/2020   Degenerative lumbar disc 09/27/2020   Degenerative arthritis of left knee 09/27/2020    PCP: Valere Dross, FNP  REFERRING PROVIDER: Creig Hines  REFERRING DIAG: Back pain and neck pain  Rationale for Evaluation and Treatment Rehabilitation  THERAPY DIAG:  Chronic bilateral low back pain without sciatica  Cervicalgia  Muscle weakness (generalized)  Stiffness of left hip, not elsewhere classified  Abnormal posture  Other symptoms and signs  involving the musculoskeletal system  ONSET DATE: 02/12/22  SUBJECTIVE:                                                                                                                                                                                           SUBJECTIVE STATEMENT:  Things are feeling good I've been coming for about 5-6 weeks, I'm feeling much better, acute health issues are taken care of for now. Stooping is hard for me, I'm OK when I'm upright but stooping gets back pain during and after. I have 2 bulging discs in the low  back area it gives me good and bad days with tennis. With ADLs I'm doing well. I think I will have to have back surgery at some point but we are putting that off. I feel like I still need to work on strength and flexibility.   PERTINENT HISTORY:  Multiple DVT, GERD  PAIN:  Are you having pain? Yes: NPRS scale: 2-3/10 Pain location: back and neck Pain description: stiffness  Aggravating factors: stooping lifting bags, etc Relieving factors: medicines, walking, exercise    PRECAUTIONS: None  WEIGHT BEARING RESTRICTIONS No  FALLS:  Has patient fallen in last 6 months? Yes. Number of falls 1  LIVING ENVIRONMENT: Lives with: lives with their family and lives alone Lives in: House/apartment Stairs: Yes: Internal: 12 steps; can reach both Has following equipment at home: None  OCCUPATION: retired  PLOF: Independent and plays tennis 2-3x/week, has not played recently due to the DVT  PATIENT GOALS have less pain, be stronger, continue tennis, be active, more flexible   OBJECTIVE:   DIAGNOSTIC FINDINGS:  HNP in the lumbar area  MUSCLE LENGTH: Hamstrings: Right 50 deg; Left 50 deg Very tight piriformis on the   9/18- moderate limitation in b hip flexors and quads, piriformis mild limitation, HS severe limitation B   POSTURE: rounded shoulders and forward head  PALPATION: Tight in the cervical area, the upper traps and the lumbar  area  LUMBAR ROM:   all feel tight per patient,   Active  A/PROM  eval 9/18  Flexion Decreased 50% 30% limited   Extension 50% 50% limited   Right lateral flexion 50% 30% limited   Left lateral flexion 50% 30% limited   Right rotation    Left rotation     (Blank rows = not tested) Cervical ROM;  flexion WNL's, all other motions decreased 50% with c/o tightness; 9/18 still 50% limited in lateral flexion, L 25% limited/R 50% limited in cervical rotation, flexion/extension WNL   LOWER EXTREMITY ROM:   WFL's, shoulder WFL's  LOWER EXTREMITY MMT:  shoulders left is 4-/5 due to a RC injury in the left shoulder, LE's 4+/5  9/18 Shoulder flexion: 4+/5 B, shoulder ABD 4+/5 B, ER R 5/5 L 4/5, IR R 5/5 L 4-/5, shoulder extension 4+/5  Ankle DF: 5/5 B, quads 5/5 B, hams 5/5 B, hip flexors 4+/5 B, hip ABD 5/5 B, hip extension 5/5 B   FUNCTIONAL TESTS:  Timed up and go (TUG): 15  GAIT: Reports fatigue with walking  miles fatigue  TODAY'S TREATMENT   9/18  Nustep L5x6 minutes BLEs only  Reassessment/goal review and education as appropriate  SKTC 5x5 seconds  B  Lumbar rotation stretch 5x5 seconds B    07/25/22 Bike Level 4 x 6 minutes Nustep level 6 x 6 minutes LEg press 40# 3x10 Leg curls 25# 3x10, then right only 15# Resisted gait 40# all directions 10# straight arm pulls 20# lats 2x10 Passive ROM LE stretches   07/18/22 Nustep level 6 x 3 minutes , level 5 x 3 minutes Straight arm pulls 10# cues for core and posture 5# hip extension and abduction 2 x10 Leg extension 10# Leg curls 25# Seated Row 25# Lats 25# Black tband back extension Leg press 40# 3x10 Passive LE stretches  8/288/23 Nustep level 5 x 6 minutes Leg extension 10# 3x10 Leg curls 25# 3x10 Triceps 25# 2x10 cues for core 10# biceps 2x10 5# hip extension and abduction Seated rows 25# 2x10 Lats 25# 2x10 Feet on ball K2C,  trunk rotation, small bridges and isometric abs Passive LE  stretches   07/04/22 Nustep level 5 x 6 minutes Rows and Lats 25# 2x10 10# leg extension 2x10 Leg curls 25# 3x10 Leg press 40# 3x10 Volleyball working on balance Passive stretch to bilateral LE's  06/28/22 NuStep Level 5 x 6 minutes Rows and Lats 25# 2x10 Leg extension 5# 2x10 Leg curls 25# 2x10 Leg PRess 20# 2x10 Feet on ball K2C, trunk rotation, small bridges, isometric abs Passive HS and piriformis stretches MHP/IFC to C/T area  PATIENT EDUCATION:  Education details: HS and piriformis stretch Person educated: Patient Education method: Consulting civil engineer, Demonstration, and Verbal cues Education comprehension: verbalized understanding   HOME EXERCISE PROGRAM: As above  ASSESSMENT:  CLINICAL IMPRESSION:  Zarion arrives today doing OK, we took objective measures and updated goals as necessary for progress note this morning. Showing some improvement over all, seems to be very slow and steady. Will benefit from continuation of PT up to the end of original certification, will plan for DC to advanced HEP and personal trainer at that time.   OBJECTIVE IMPAIRMENTS Abnormal gait, cardiopulmonary status limiting activity, decreased activity tolerance, decreased balance, decreased endurance, decreased mobility, difficulty walking, decreased ROM, decreased strength, impaired perceived functional ability, increased muscle spasms, impaired flexibility, postural dysfunction, and pain.   REHAB POTENTIAL: Good  CLINICAL DECISION MAKING: Stable/uncomplicated  EVALUATION COMPLEXITY: Low   GOALS: Goals reviewed with patient? Yes  SHORT TERM GOALS: Target date: 06/27/22  Independent with initial HEP Goal status: met  LONG TERM GOALS: Target date: 09/04/22  Indepednetn with advanced HEP Goal status: ongoing  2.  Understand posture and body mechanics   Goal status: 9/18- ONGOING, poor biomechanics   3.  Decreae pain overall 50% Goal status:9/18- going well, MET   4.  Return to  playing tennis without issue Goal status: 9/18- for issues he came to PT for- very hard to answer multiple health issues in the picture DEFERRED GOAL    PLAN: PT FREQUENCY: 1-2x/week  PT DURATION: 12 weeks  PLANNED INTERVENTIONS: Therapeutic exercises, Therapeutic activity, Neuromuscular re-education, Balance training, Gait training, Patient/Family education, Self Care, Joint mobilization, Dry Needling, Electrical stimulation, Spinal mobilization, Cryotherapy, Moist heat, Taping, Traction, and Manual therapy.  PLAN FOR NEXT SESSION:  postural and biomechanics training, keep working on mobility and flexibility strength is better    Cheryel Kyte U PT DPT PN2  07/31/2022, 10:14 AM

## 2022-08-02 ENCOUNTER — Ambulatory Visit: Payer: Medicare Other | Admitting: Physical Therapy

## 2022-08-09 ENCOUNTER — Encounter: Payer: Self-pay | Admitting: Physical Therapy

## 2022-08-09 ENCOUNTER — Ambulatory Visit: Payer: Medicare Other | Admitting: Physical Therapy

## 2022-08-09 DIAGNOSIS — G8929 Other chronic pain: Secondary | ICD-10-CM | POA: Diagnosis not present

## 2022-08-09 DIAGNOSIS — M25652 Stiffness of left hip, not elsewhere classified: Secondary | ICD-10-CM

## 2022-08-09 DIAGNOSIS — R29898 Other symptoms and signs involving the musculoskeletal system: Secondary | ICD-10-CM

## 2022-08-09 DIAGNOSIS — R293 Abnormal posture: Secondary | ICD-10-CM

## 2022-08-09 DIAGNOSIS — M6281 Muscle weakness (generalized): Secondary | ICD-10-CM | POA: Diagnosis not present

## 2022-08-09 DIAGNOSIS — M542 Cervicalgia: Secondary | ICD-10-CM

## 2022-08-09 DIAGNOSIS — M545 Low back pain, unspecified: Secondary | ICD-10-CM | POA: Diagnosis not present

## 2022-08-09 NOTE — Therapy (Signed)
OUTPATIENT PHYSICAL THERAPY THORACOLUMBAR TREATMENT   Patient Name: Raymond [name]. MRN: [id number] DOB:07-22-1951, 71 y.o., male Today's Date: 08/09/2022  Progress Note  Reporting Period 06/12/22 to 07/31/22  See note below for Objective Data and Assessment of Progress/Goals.       PT End of Session - 08/09/22 0849     Visit Number 11    Date for PT Re-Evaluation 09/12/22    Authorization Type Medicare    PT Start Time 0845    PT Stop Time 0931    PT Time Calculation (min) 46 min    Activity Tolerance Patient tolerated treatment well    Behavior During Therapy [facility] for tasks assessed/performed              Past Medical History:  Diagnosis Date   DVT (deep venous thrombosis) ([place])    GERD (gastroesophageal reflux disease)    Hyperlipidemia    Hypertension    Leg DVT (deep venous thromboembolism), chronic, left ([place]) 12/12/2021   Past Surgical History:  Procedure Laterality Date   EYE SURGERY     Patient Active Problem List   Diagnosis Date Noted   Pain of right lower extremity due to ischemia 05/01/2022   Leg DVT (deep venous thromboembolism), chronic, left ([place]) 12/12/2021   Mixed hyperlipidemia 11/21/2021   Low libido 11/21/2021   Low back pain 11/21/2021   Loss of appetite 11/21/2021   Hypertensive retinopathy 11/21/2021   Essential hypertension 11/21/2021   Chronic pain 11/21/2021   Enlarged prostate 06/20/2021   Degenerative disc disease, cervical 12/03/2020   Degenerative lumbar disc 09/27/2020   Degenerative arthritis of left knee 09/27/2020    PCP: Valere Dross, FNP  REFERRING PROVIDER: Creig Hines  REFERRING DIAG: Back pain and neck pain  Rationale for Evaluation and Treatment Rehabilitation  THERAPY DIAG:  Chronic bilateral low back pain without sciatica  Cervicalgia  Muscle weakness (generalized)  Stiffness of left hip, not elsewhere classified  Abnormal posture  Other symptoms and signs involving the musculoskeletal  system  ONSET DATE: 02/12/22  SUBJECTIVE:                                                                                                                                                                                           SUBJECTIVE STATEMENT:  Doing well, I have only played tennis about 3 times, I am going to try to add some more, the stretching does great  PERTINENT HISTORY:  Multiple DVT, GERD  PAIN:  Are you having pain? Yes: NPRS scale: 2/10 Pain location: back and neck Pain description: stiffness  Aggravating factors: stooping lifting bags, etc Relieving factors: medicines,  walking, exercise    PRECAUTIONS: None  WEIGHT BEARING RESTRICTIONS No  FALLS:  Has patient fallen in last 6 months? Yes. Number of falls 1  LIVING ENVIRONMENT: Lives with: lives with their family and lives alone Lives in: House/apartment Stairs: Yes: Internal: 12 steps; can reach both Has following equipment at home: None  OCCUPATION: retired  PLOF: Independent and plays tennis 2-3x/week, has not played recently due to the DVT  PATIENT GOALS have less pain, be stronger, continue tennis, be active, more flexible   OBJECTIVE:   DIAGNOSTIC FINDINGS:  HNP in the lumbar area  MUSCLE LENGTH: Hamstrings: Right 50 deg; Left 50 deg Very tight piriformis on the   9/18- moderate limitation in b hip flexors and quads, piriformis mild limitation, HS severe limitation B   POSTURE: rounded shoulders and forward head  PALPATION: Tight in the cervical area, the upper traps and the lumbar area  LUMBAR ROM:   all feel tight per patient,   Active  A/PROM  eval 9/18  Flexion Decreased 50% 30% limited   Extension 50% 50% limited   Right lateral flexion 50% 30% limited   Left lateral flexion 50% 30% limited   Right rotation    Left rotation     (Blank rows = not tested) Cervical ROM;  flexion WNL's, all other motions decreased 50% with c/o tightness; 9/18 still 50% limited in lateral  flexion, L 25% limited/R 50% limited in cervical rotation, flexion/extension WNL   LOWER EXTREMITY ROM:   WFL's, shoulder WFL's  LOWER EXTREMITY MMT:  shoulders left is 4-/5 due to a RC injury in the left shoulder, LE's 4+/5  9/18 Shoulder flexion: 4+/5 B, shoulder ABD 4+/5 B, ER R 5/5 L 4/5, IR R 5/5 L 4-/5, shoulder extension 4+/5  Ankle DF: 5/5 B, quads 5/5 B, hams 5/5 B, hip flexors 4+/5 B, hip ABD 5/5 B, hip extension 5/5 B   FUNCTIONAL TESTS:  Timed up and go (TUG): 15  GAIT: Reports fatigue with walking  miles fatigue  TODAY'S TREATMENT  08/09/22 NuStep Level 6 x 6 minutes Bike Level 4 x 6 minutes Rows 20# 2x15 Lats 20# 2x15 Back to wall red tband ER Back to wall weighted ball overhead lifts W backs 40# leg press 2x15 PROM of the LE's   9/18  Nustep L5x6 minutes BLEs only  Reassessment/goal review and education as appropriate  SKTC 5x5 seconds  B  Lumbar rotation stretch 5x5 seconds B    07/25/22 Bike Level 4 x 6 minutes Nustep level 6 x 6 minutes LEg press 40# 3x10 Leg curls 25# 3x10, then right only 15# Resisted gait 40# all directions 10# straight arm pulls 20# lats 2x10 Passive ROM LE stretches   07/18/22 Nustep level 6 x 3 minutes , level 5 x 3 minutes Straight arm pulls 10# cues for core and posture 5# hip extension and abduction 2 x10 Leg extension 10# Leg curls 25# Seated Row 25# Lats 25# Black tband back extension Leg press 40# 3x10 Passive LE stretches  8/288/23 Nustep level 5 x 6 minutes Leg extension 10# 3x10 Leg curls 25# 3x10 Triceps 25# 2x10 cues for core 10# biceps 2x10 5# hip extension and abduction Seated rows 25# 2x10 Lats 25# 2x10 Feet on ball K2C, trunk rotation, small bridges and isometric abs Passive LE stretches   07/04/22 Nustep level 5 x 6 minutes Rows and Lats 25# 2x10 10# leg extension 2x10 Leg curls 25# 3x10 Leg press 40# 3x10 Volleyball working on balance  Passive stretch to bilateral  LE's  06/28/22 NuStep Level 5 x 6 minutes Rows and Lats 25# 2x10 Leg extension 5# 2x10 Leg curls 25# 2x10 Leg PRess 20# 2x10 Feet on ball K2C, trunk rotation, small bridges, isometric abs Passive HS and piriformis stretches MHP/IFC to C/T area  PATIENT EDUCATION:  Education details: HS and piriformis stretch Person educated: Patient Education method: Consulting civil engineer, Demonstration, and Verbal cues Education comprehension: verbalized understanding   HOME EXERCISE PROGRAM: As above  ASSESSMENT:  CLINICAL IMPRESSION:  Working with patient to advance him to the personal trainer level, he is going to play tennis this afternoon and see how he does, he does have some issues with the LE's the shoulders and the hips, he has improved overall to less pain and less difficulty with ADL's, still fatigues with yardwork and tennis, has some swelling of the right calf area, non tender.  OBJECTIVE IMPAIRMENTS Abnormal gait, cardiopulmonary status limiting activity, decreased activity tolerance, decreased balance, decreased endurance, decreased mobility, difficulty walking, decreased ROM, decreased strength, impaired perceived functional ability, increased muscle spasms, impaired flexibility, postural dysfunction, and pain.   REHAB POTENTIAL: Good  CLINICAL DECISION MAKING: Stable/uncomplicated  EVALUATION COMPLEXITY: Low   GOALS: Goals reviewed with patient? Yes  SHORT TERM GOALS: Target date: 06/27/22  Independent with initial HEP Goal status: met  LONG TERM GOALS: Target date: 09/04/22  Indepednetn with advanced HEP Goal status: ongoing  2.  Understand posture and body mechanics   Goal status: 9/18- ONGOING, poor biomechanics   3.  Decreae pain overall 50% Goal status:9/18- going well, MET   4.  Return to playing tennis without issue Goal status: 9/18- for issues he came to PT for- very hard to answer multiple health issues in the picture DEFERRED GOAL    PLAN: PT FREQUENCY:  1-2x/week  PT DURATION: 12 weeks  PLANNED INTERVENTIONS: Therapeutic exercises, Therapeutic activity, Neuromuscular re-education, Balance training, Gait training, Patient/Family education, Self Care, Joint mobilization, Dry Needling, Electrical stimulation, Spinal mobilization, Cryotherapy, Moist heat, Taping, Traction, and Manual therapy.  PLAN FOR NEXT SESSION:  postural and biomechanics training, keep working on mobility and flexibility strength is better    Lum Babe, PT 08/09/2022, 8:50 AM

## 2022-08-10 ENCOUNTER — Encounter: Payer: Self-pay | Admitting: Physical Therapy

## 2022-08-10 ENCOUNTER — Ambulatory Visit: Payer: Medicare Other | Admitting: Physical Therapy

## 2022-08-10 DIAGNOSIS — M6281 Muscle weakness (generalized): Secondary | ICD-10-CM | POA: Diagnosis not present

## 2022-08-10 DIAGNOSIS — R293 Abnormal posture: Secondary | ICD-10-CM

## 2022-08-10 DIAGNOSIS — M542 Cervicalgia: Secondary | ICD-10-CM

## 2022-08-10 DIAGNOSIS — M25652 Stiffness of left hip, not elsewhere classified: Secondary | ICD-10-CM

## 2022-08-10 DIAGNOSIS — G8929 Other chronic pain: Secondary | ICD-10-CM | POA: Diagnosis not present

## 2022-08-10 DIAGNOSIS — R29898 Other symptoms and signs involving the musculoskeletal system: Secondary | ICD-10-CM

## 2022-08-10 DIAGNOSIS — M545 Low back pain, unspecified: Secondary | ICD-10-CM

## 2022-08-10 NOTE — Therapy (Signed)
OUTPATIENT PHYSICAL THERAPY THORACOLUMBAR TREATMENT   Patient Name: Raymond [name]. MRN: [id number] DOB:September 26, 1951, 71 y.o., male Today's Date: 08/10/2022  .       PT End of Session - 08/10/22 0809     Visit Number 12    Date for PT Re-Evaluation 09/12/22    Authorization Type Medicare    Progress Note Due on Visit 87    PT Start Time 0802    PT Stop Time 9381    PT Time Calculation (min) 41 min    Activity Tolerance Patient tolerated treatment well    Behavior During Therapy [facility] for tasks assessed/performed               Past Medical History:  Diagnosis Date   DVT (deep venous thrombosis) (HCC)    GERD (gastroesophageal reflux disease)    Hyperlipidemia    Hypertension    Leg DVT (deep venous thromboembolism), chronic, left ([place]) 12/12/2021   Past Surgical History:  Procedure Laterality Date   EYE SURGERY     Patient Active Problem List   Diagnosis Date Noted   Pain of right lower extremity due to ischemia 05/01/2022   Leg DVT (deep venous thromboembolism), chronic, left ([place]) 12/12/2021   Mixed hyperlipidemia 11/21/2021   Low libido 11/21/2021   Low back pain 11/21/2021   Loss of appetite 11/21/2021   Hypertensive retinopathy 11/21/2021   Essential hypertension 11/21/2021   Chronic pain 11/21/2021   Enlarged prostate 06/20/2021   Degenerative disc disease, cervical 12/03/2020   Degenerative lumbar disc 09/27/2020   Degenerative arthritis of left knee 09/27/2020    PCP: Valere Dross, FNP  REFERRING PROVIDER: Creig Hines  REFERRING DIAG: Back pain and neck pain  Rationale for Evaluation and Treatment Rehabilitation  THERAPY DIAG:  Chronic bilateral low back pain without sciatica  Cervicalgia  Muscle weakness (generalized)  Stiffness of left hip, not elsewhere classified  Abnormal posture  Other symptoms and signs involving the musculoskeletal system  ONSET DATE: 02/12/22  SUBJECTIVE:                                                                                                                                                                                            SUBJECTIVE STATEMENT:  I played tennis last night messed up my shoulder playing in cold weather I was serving, it hasn't been diagnosed but someone told me I have a rotator cuff issue. Nothing else new going on. Feeling stiff especially in the mornings.   PERTINENT HISTORY:  Multiple DVT, GERD  PAIN:  Are you having pain? Yes: NPRS scale: 6/10 Pain location: L shoulder Pain description: sharp  Aggravating factors: lifting arm Relieving factors: not sure as it just started    PRECAUTIONS: None  WEIGHT BEARING RESTRICTIONS No  FALLS:  Has patient fallen in last 6 months? Yes. Number of falls 1  LIVING ENVIRONMENT: Lives with: lives with their family and lives alone Lives in: House/apartment Stairs: Yes: Internal: 12 steps; can reach both Has following equipment at home: None  OCCUPATION: retired  PLOF: Independent and plays tennis 2-3x/week, has not played recently due to the DVT  PATIENT GOALS have less pain, be stronger, continue tennis, be active, more flexible   OBJECTIVE:   DIAGNOSTIC FINDINGS:  HNP in the lumbar area  MUSCLE LENGTH: Hamstrings: Right 50 deg; Left 50 deg Very tight piriformis on the   9/18- moderate limitation in b hip flexors and quads, piriformis mild limitation, HS severe limitation B   POSTURE: rounded shoulders and forward head  PALPATION: Tight in the cervical area, the upper traps and the lumbar area  LUMBAR ROM:   all feel tight per patient,   Active  A/PROM  eval 9/18  Flexion Decreased 50% 30% limited   Extension 50% 50% limited   Right lateral flexion 50% 30% limited   Left lateral flexion 50% 30% limited   Right rotation    Left rotation     (Blank rows = not tested) Cervical ROM;  flexion WNL's, all other motions decreased 50% with c/o tightness; 9/18 still 50% limited in lateral flexion, L  25% limited/R 50% limited in cervical rotation, flexion/extension WNL   LOWER EXTREMITY ROM:   WFL's, shoulder WFL's  LOWER EXTREMITY MMT:  shoulders left is 4-/5 due to a RC injury in the left shoulder, LE's 4+/5  9/18 Shoulder flexion: 4+/5 B, shoulder ABD 4+/5 B, ER R 5/5 L 4/5, IR R 5/5 L 4-/5, shoulder extension 4+/5  Ankle DF: 5/5 B, quads 5/5 B, hams 5/5 B, hip flexors 4+/5 B, hip ABD 5/5 B, hip extension 5/5 B   FUNCTIONAL TESTS:  Timed up and go (TUG): 15  GAIT: Reports fatigue with walking  miles fatigue  TODAY'S TREATMENT   9/28  TherEx:  Scifit bike L5 x6 minutes SKTC stretch 5x10 second holds B Lumbar rotation stretch 5x10 seconds B HS stretches 3x30 seconds B  Piriformis stretches 3x30 seconds B  3D hip excursions x20 each way   Hip flexor stretches 2x30 seconds B   Education on shoulder management/use of OTC pain control as cleared by PCP, needs to get shoulder formally evaluated by ortho doc if not better in 5 days- would not delay care. Encouraged starting independent strengthening program on his own. Education on importance of balanced mobility and strength work- improving overall mobility will reduce injury risk especially if done before and after tennis.              08/09/22 NuStep Level 6 x 6 minutes Bike Level 4 x 6 minutes Rows 20# 2x15 Lats 20# 2x15 Back to wall red tband ER Back to wall weighted ball overhead lifts W backs 40# leg press 2x15 PROM of the LE's   9/18  Nustep L5x6 minutes BLEs only  Reassessment/goal review and education as appropriate  SKTC 5x5 seconds  B  Lumbar rotation stretch 5x5 seconds B    07/25/22 Bike Level 4 x 6 minutes Nustep level 6 x 6 minutes LEg press 40# 3x10 Leg curls 25# 3x10, then right only 15# Resisted gait 40# all directions 10# straight arm pulls 20# lats 2x10 Passive ROM LE  stretches   07/18/22 Nustep level 6 x 3 minutes , level 5 x 3 minutes Straight arm pulls 10# cues for core and  posture 5# hip extension and abduction 2 x10 Leg extension 10# Leg curls 25# Seated Row 25# Lats 25# Black tband back extension Leg press 40# 3x10 Passive LE stretches  8/288/23 Nustep level 5 x 6 minutes Leg extension 10# 3x10 Leg curls 25# 3x10 Triceps 25# 2x10 cues for core 10# biceps 2x10 5# hip extension and abduction Seated rows 25# 2x10 Lats 25# 2x10 Feet on ball K2C, trunk rotation, small bridges and isometric abs Passive LE stretches   07/04/22 Nustep level 5 x 6 minutes Rows and Lats 25# 2x10 10# leg extension 2x10 Leg curls 25# 3x10 Leg press 40# 3x10 Volleyball working on balance Passive stretch to bilateral LE's  06/28/22 NuStep Level 5 x 6 minutes Rows and Lats 25# 2x10 Leg extension 5# 2x10 Leg curls 25# 2x10 Leg PRess 20# 2x10 Feet on ball K2C, trunk rotation, small bridges, isometric abs Passive HS and piriformis stretches MHP/IFC to C/T area  PATIENT EDUCATION:  Education details: HS and piriformis stretch Person educated: Patient Education method: Consulting civil engineer, Demonstration, and Verbal cues Education comprehension: verbalized understanding   HOME EXERCISE PROGRAM: As above  ASSESSMENT:  CLINICAL IMPRESSION:  Kasey arrives today doing OK, hurt his shoulder serving in tennis yesterday I advised him to get this formally evaluated if it has not improved within the next 5 days. We focused on lower body mobility and flexibility today, his main complaints are stiffness and impaired flexibility especially in his back I think his strength is coming along really well. Will continue to progress as able and tolerated, I think he will be ready for DC soon.   OBJECTIVE IMPAIRMENTS Abnormal gait, cardiopulmonary status limiting activity, decreased activity tolerance, decreased balance, decreased endurance, decreased mobility, difficulty walking, decreased ROM, decreased strength, impaired perceived functional ability, increased muscle spasms, impaired  flexibility, postural dysfunction, and pain.   REHAB POTENTIAL: Good  CLINICAL DECISION MAKING: Stable/uncomplicated  EVALUATION COMPLEXITY: Low   GOALS: Goals reviewed with patient? Yes  SHORT TERM GOALS: Target date: 06/27/22  Independent with initial HEP Goal status: met  LONG TERM GOALS: Target date: 09/04/22  Indepednetn with advanced HEP Goal status: ongoing  2.  Understand posture and body mechanics   Goal status: 9/18- ONGOING, poor biomechanics   3.  Decreae pain overall 50% Goal status:9/18- going well, MET   4.  Return to playing tennis without issue Goal status: 9/18- for issues he came to PT for- very hard to answer multiple health issues in the picture DEFERRED GOAL    PLAN: PT FREQUENCY: 1-2x/week  PT DURATION: 12 weeks  PLANNED INTERVENTIONS: Therapeutic exercises, Therapeutic activity, Neuromuscular re-education, Balance training, Gait training, Patient/Family education, Self Care, Joint mobilization, Dry Needling, Electrical stimulation, Spinal mobilization, Cryotherapy, Moist heat, Taping, Traction, and Manual therapy.  PLAN FOR NEXT SESSION:  postural and biomechanics training, keep working on mobility and flexibility strength is better. Monitor shoulder pain   Manie Bealer U PT DPT PN2  08/10/2022, 8:43 AM

## 2022-08-14 ENCOUNTER — Ambulatory Visit: Payer: Medicare Other | Attending: Family Medicine | Admitting: Physical Therapy

## 2022-08-14 DIAGNOSIS — M542 Cervicalgia: Secondary | ICD-10-CM | POA: Insufficient documentation

## 2022-08-14 DIAGNOSIS — M6281 Muscle weakness (generalized): Secondary | ICD-10-CM | POA: Diagnosis not present

## 2022-08-14 DIAGNOSIS — M545 Low back pain, unspecified: Secondary | ICD-10-CM | POA: Insufficient documentation

## 2022-08-14 DIAGNOSIS — M25652 Stiffness of left hip, not elsewhere classified: Secondary | ICD-10-CM | POA: Insufficient documentation

## 2022-08-14 DIAGNOSIS — G8929 Other chronic pain: Secondary | ICD-10-CM | POA: Insufficient documentation

## 2022-08-14 DIAGNOSIS — R293 Abnormal posture: Secondary | ICD-10-CM | POA: Insufficient documentation

## 2022-08-14 NOTE — Therapy (Signed)
OUTPATIENT PHYSICAL THERAPY THORACOLUMBAR TREATMENT   Patient Name: Raymond [name]. MRN: [id number] DOB:05-22-1951, 71 y.o., male Today's Date: 08/14/2022  .       PT End of Session - 08/14/22 1346     Visit Number 13    Date for PT Re-Evaluation 09/12/22    Authorization Type Medicare    PT Start Time 1345    PT Stop Time 1430    PT Time Calculation (min) 45 min    Activity Tolerance Patient tolerated treatment well    Behavior During Therapy WFL for tasks assessed/performed               Past Medical History:  Diagnosis Date   DVT (deep venous thrombosis) ([place])    GERD (gastroesophageal reflux disease)    Hyperlipidemia    Hypertension    Leg DVT (deep venous thromboembolism), chronic, left ([place]) 12/12/2021   Past Surgical History:  Procedure Laterality Date   EYE SURGERY     Patient Active Problem List   Diagnosis Date Noted   Pain of right lower extremity due to ischemia 05/01/2022   Leg DVT (deep venous thromboembolism), chronic, left ([place]) 12/12/2021   Mixed hyperlipidemia 11/21/2021   Low libido 11/21/2021   Low back pain 11/21/2021   Loss of appetite 11/21/2021   Hypertensive retinopathy 11/21/2021   Essential hypertension 11/21/2021   Chronic pain 11/21/2021   Enlarged prostate 06/20/2021   Degenerative disc disease, cervical 12/03/2020   Degenerative lumbar disc 09/27/2020   Degenerative arthritis of left knee 09/27/2020    PCP: Valere Dross, FNP  REFERRING PROVIDER: Creig Hines  REFERRING DIAG: Back pain and neck pain  Rationale for Evaluation and Treatment Rehabilitation  THERAPY DIAG:  Chronic bilateral low back pain without sciatica  Cervicalgia  Muscle weakness (generalized)  ONSET DATE: 02/12/22  SUBJECTIVE:                                                                                                                                                                                           SUBJECTIVE STATEMENT:  L shoulder  is back to normal. Had a tennis workout this morning.  PERTINENT HISTORY:  Multiple DVT, GERD  PAIN:  Are you having pain? Yes: NPRS scale: 3/10 Pain location: L shoulder Pain description: sharp  Aggravating factors: lifting arm Relieving factors: not sure as it just started    PRECAUTIONS: None  WEIGHT BEARING RESTRICTIONS No  FALLS:  Has patient fallen in last 6 months? Yes. Number of falls 1  LIVING ENVIRONMENT: Lives with: lives with their family and lives alone Lives in: House/apartment Stairs: Yes: Internal: 12 steps; can  reach both Has following equipment at home: None  OCCUPATION: retired  PLOF: Independent and plays tennis 2-3x/week, has not played recently due to the DVT  PATIENT GOALS have less pain, be stronger, continue tennis, be active, more flexible   OBJECTIVE:   DIAGNOSTIC FINDINGS:  HNP in the lumbar area  MUSCLE LENGTH: Hamstrings: Right 50 deg; Left 50 deg Very tight piriformis on the   9/18- moderate limitation in b hip flexors and quads, piriformis mild limitation, HS severe limitation B   POSTURE: rounded shoulders and forward head  PALPATION: Tight in the cervical area, the upper traps and the lumbar area  LUMBAR ROM:   all feel tight per patient,   Active  A/PROM  eval 9/18  Flexion Decreased 50% 30% limited   Extension 50% 50% limited   Right lateral flexion 50% 30% limited   Left lateral flexion 50% 30% limited   Right rotation    Left rotation     (Blank rows = not tested) Cervical ROM;  flexion WNL's, all other motions decreased 50% with c/o tightness; 9/18 still 50% limited in lateral flexion, L 25% limited/R 50% limited in cervical rotation, flexion/extension WNL   LOWER EXTREMITY ROM:   WFL's, shoulder WFL's  LOWER EXTREMITY MMT:  shoulders left is 4-/5 due to a RC injury in the left shoulder, LE's 4+/5  9/18 Shoulder flexion: 4+/5 B, shoulder ABD 4+/5 B, ER R 5/5 L 4/5, IR R 5/5 L 4-/5, shoulder extension  4+/5  Ankle DF: 5/5 B, quads 5/5 B, hams 5/5 B, hip flexors 4+/5 B, hip ABD 5/5 B, hip extension 5/5 B   FUNCTIONAL TESTS:  Timed up and go (TUG): 15  GAIT: Reports fatigue with walking  miles fatigue  TODAY'S TREATMENT   08/14/22 NuStep L5 x 6 min  UBE L2 x 2 min each  Leg press 40lb 2x15 Rows 20# 2x15 Lats 20# 2x15 Back to wall red tband ER W backs  Shoulder flex 2lb 2x10 Shoulder abd 2lb 2x10 Stretching Hamstring, Single K2C, Piriformis   9/28  TherEx:  Scifit bike L5 x6 minutes SKTC stretch 5x10 second holds B Lumbar rotation stretch 5x10 seconds B HS stretches 3x30 seconds B  Piriformis stretches 3x30 seconds B  3D hip excursions x20 each way   Hip flexor stretches 2x30 seconds B   Education on shoulder management/use of OTC pain control as cleared by PCP, needs to get shoulder formally evaluated by ortho doc if not better in 5 days- would not delay care. Encouraged starting independent strengthening program on his own. Education on importance of balanced mobility and strength work- improving overall mobility will reduce injury risk especially if done before and after tennis.              08/09/22 NuStep Level 6 x 6 minutes Bike Level 4 x 6 minutes Rows 20# 2x15 Lats 20# 2x15 Back to wall red tband ER Back to wall weighted ball overhead lifts W backs 40# leg press 2x15 PROM of the LE's   9/18  Nustep L5x6 minutes BLEs only  Reassessment/goal review and education as appropriate  SKTC 5x5 seconds  B  Lumbar rotation stretch 5x5 seconds B    07/25/22 Bike Level 4 x 6 minutes Nustep level 6 x 6 minutes LEg press 40# 3x10 Leg curls 25# 3x10, then right only 15# Resisted gait 40# all directions 10# straight arm pulls 20# lats 2x10 Passive ROM LE stretches   07/18/22 Nustep level 6 x 3 minutes ,  level 5 x 3 minutes Straight arm pulls 10# cues for core and posture 5# hip extension and abduction 2 x10 Leg extension 10# Leg curls 25# Seated Row  25# Lats 25# Black tband back extension Leg press 40# 3x10 Passive LE stretches  8/288/23 Nustep level 5 x 6 minutes Leg extension 10# 3x10 Leg curls 25# 3x10 Triceps 25# 2x10 cues for core 10# biceps 2x10 5# hip extension and abduction Seated rows 25# 2x10 Lats 25# 2x10 Feet on ball K2C, trunk rotation, small bridges and isometric abs Passive LE stretches   07/04/22 Nustep level 5 x 6 minutes Rows and Lats 25# 2x10 10# leg extension 2x10 Leg curls 25# 3x10 Leg press 40# 3x10 Volleyball working on balance Passive stretch to bilateral LE's  06/28/22 NuStep Level 5 x 6 minutes Rows and Lats 25# 2x10 Leg extension 5# 2x10 Leg curls 25# 2x10 Leg PRess 20# 2x10 Feet on ball K2C, trunk rotation, small bridges, isometric abs Passive HS and piriformis stretches MHP/IFC to C/T area  PATIENT EDUCATION:  Education details: HS and piriformis stretch Person educated: Patient Education method: Consulting civil engineer, Demonstration, and Verbal cues Education comprehension: verbalized understanding   HOME EXERCISE PROGRAM: As above  ASSESSMENT:  CLINICAL IMPRESSION:  Jovante arrives today doing OK, He reports L shoulder improvement overall .We focused total body strengthening and low body flexibility today. Some postural limitation with W backs. Will continue to progress as able and tolerated.   OBJECTIVE IMPAIRMENTS Abnormal gait, cardiopulmonary status limiting activity, decreased activity tolerance, decreased balance, decreased endurance, decreased mobility, difficulty walking, decreased ROM, decreased strength, impaired perceived functional ability, increased muscle spasms, impaired flexibility, postural dysfunction, and pain.   REHAB POTENTIAL: Good  CLINICAL DECISION MAKING: Stable/uncomplicated  EVALUATION COMPLEXITY: Low   GOALS: Goals reviewed with patient? Yes  SHORT TERM GOALS: Target date: 06/27/22  Independent with initial HEP Goal status: met  LONG TERM GOALS:  Target date: 09/04/22  Indepednetn with advanced HEP Goal status: ongoing  2.  Understand posture and body mechanics   Goal status: 9/18- ONGOING, poor biomechanics   3.  Decreae pain overall 50% Goal status:9/18- going well, MET   4.  Return to playing tennis without issue Goal status: 9/18- for issues he came to PT for- very hard to answer multiple health issues in the picture DEFERRED GOAL    PLAN: PT FREQUENCY: 1-2x/week  PT DURATION: 12 weeks  PLANNED INTERVENTIONS: Therapeutic exercises, Therapeutic activity, Neuromuscular re-education, Balance training, Gait training, Patient/Family education, Self Care, Joint mobilization, Dry Needling, Electrical stimulation, Spinal mobilization, Cryotherapy, Moist heat, Taping, Traction, and Manual therapy.  PLAN FOR NEXT SESSION:  postural and biomechanics training, keep working on mobility and flexibility strength is better. Monitor shoulder pain   Kristen U PT DPT PN2  08/14/2022, 1:47 PM

## 2022-08-15 ENCOUNTER — Other Ambulatory Visit: Payer: Self-pay | Admitting: Family

## 2022-08-15 DIAGNOSIS — Z86718 Personal history of other venous thrombosis and embolism: Secondary | ICD-10-CM

## 2022-08-16 ENCOUNTER — Inpatient Hospital Stay: Payer: Medicare Other | Attending: Family

## 2022-08-16 ENCOUNTER — Telehealth: Payer: Self-pay

## 2022-08-16 ENCOUNTER — Encounter: Payer: Self-pay | Admitting: Family

## 2022-08-16 ENCOUNTER — Inpatient Hospital Stay (HOSPITAL_BASED_OUTPATIENT_CLINIC_OR_DEPARTMENT_OTHER): Payer: Medicare Other | Admitting: Family

## 2022-08-16 ENCOUNTER — Other Ambulatory Visit: Payer: Self-pay

## 2022-08-16 ENCOUNTER — Ambulatory Visit (HOSPITAL_BASED_OUTPATIENT_CLINIC_OR_DEPARTMENT_OTHER)
Admission: RE | Admit: 2022-08-16 | Discharge: 2022-08-16 | Disposition: A | Discharge status: Home / Self Care | Payer: Medicare Other | Source: Ambulatory Visit | Attending: Family | Admitting: Family

## 2022-08-16 VITALS — BP 124/74 | HR 75 | Temp 97.8°F | Resp 19 | Ht 71.0 in | Wt 174.0 lb

## 2022-08-16 DIAGNOSIS — I82402 Acute embolism and thrombosis of unspecified deep veins of left lower extremity: Secondary | ICD-10-CM | POA: Diagnosis not present

## 2022-08-16 DIAGNOSIS — Z86718 Personal history of other venous thrombosis and embolism: Secondary | ICD-10-CM

## 2022-08-16 DIAGNOSIS — I82502 Chronic embolism and thrombosis of unspecified deep veins of left lower extremity: Secondary | ICD-10-CM | POA: Diagnosis not present

## 2022-08-16 DIAGNOSIS — I82412 Acute embolism and thrombosis of left femoral vein: Secondary | ICD-10-CM | POA: Insufficient documentation

## 2022-08-16 DIAGNOSIS — I82512 Chronic embolism and thrombosis of left femoral vein: Secondary | ICD-10-CM | POA: Diagnosis not present

## 2022-08-16 DIAGNOSIS — Z7901 Long term (current) use of anticoagulants: Secondary | ICD-10-CM | POA: Insufficient documentation

## 2022-08-16 LAB — CMP (CANCER CENTER ONLY)
ALT: 15 U/L (ref 0–44)
AST: 22 U/L (ref 15–41)
Albumin: 4.1 g/dL (ref 3.5–5.0)
Alkaline Phosphatase: 107 U/L (ref 38–126)
Anion gap: 6 (ref 5–15)
BUN: 16 mg/dL (ref 8–23)
CO2: 30 mmol/L (ref 22–32)
Calcium: 9.1 mg/dL (ref 8.9–10.3)
Chloride: 105 mmol/L (ref 98–111)
Creatinine: 1.04 mg/dL (ref 0.61–1.24)
GFR, Estimated: >60 mL/min (ref >60)
Glucose, Bld: 92 mg/dL (ref 70–99)
Potassium: 4.3 mmol/L (ref 3.5–5.1)
Sodium: 141 mmol/L (ref 135–145)
Total Bilirubin: 1.1 mg/dL (ref 0.3–1.2)
Total Protein: 7.1 g/dL (ref 6.5–8.1)

## 2022-08-16 LAB — CBC WITH DIFFERENTIAL (CANCER CENTER ONLY)
Abs Immature Granulocytes: 0.02 10*3/uL (ref 0.00–0.07)
Basophils Absolute: 0 10*3/uL (ref 0.0–0.1)
Basophils Relative: 1 %
Eosinophils Absolute: 0.2 10*3/uL (ref 0.0–0.5)
Eosinophils Relative: 3 %
HCT: 44 % (ref 39.0–52.0)
Hemoglobin: 14.5 g/dL (ref 13.0–17.0)
Immature Granulocytes: 0 %
Lymphocytes Relative: 29 %
Lymphs Abs: 2.1 10*3/uL (ref 0.7–4.0)
MCH: 31.7 pg (ref 26.0–34.0)
MCHC: 33 g/dL (ref 30.0–36.0)
MCV: 96.3 fL (ref 80.0–100.0)
Monocytes Absolute: 0.5 10*3/uL (ref 0.1–1.0)
Monocytes Relative: 7 %
Neutro Abs: 4.4 10*3/uL (ref 1.7–7.7)
Neutrophils Relative %: 60 %
Platelet Count: 223 10*3/uL (ref 150–400)
RBC: 4.57 MIL/uL (ref 4.22–5.81)
RDW: 14.2 % (ref 11.5–15.5)
WBC Count: 7.3 10*3/uL (ref 4.0–10.5)
nRBC: 0 % (ref 0.0–0.2)

## 2022-08-16 LAB — PROTIME-INR
INR: 5.3 (ref 0.8–1.2)
Prothrombin Time: 48.5 seconds — ABNORMAL HIGH (ref 11.4–15.2)

## 2022-08-16 LAB — LACTATE DEHYDROGENASE: LDH: 229 U/L — ABNORMAL HIGH (ref 98–192)

## 2022-08-16 NOTE — Progress Notes (Unsigned)
CRITICAL PT/INR RESULTS GIVEN TO Sparks RN AT [phone]

## 2022-08-16 NOTE — Progress Notes (Signed)
Hematology and Oncology Follow Up Visit  Makih Stefanko Sr. [id number] May 09, 1951 71 y.o. 08/16/2022   Principle Diagnosis:  Thrombus of left femoral vein down to left popliteal vein --idiopathic   Current Therapy:        Pradaxa 150 mg po BID -- started on 05/16/2022 d/c'd Coumadin adjusted to maintain INR of 2.5-3.5   Interim History:  Mr. Albarran is here today for follow-up. His repeat US of the left lower extremity showed improvement. He had no acute DVT noted with partially occlusive chronic thrombus of the distal femoral and gastrocnemius veins.  His coumadin instructions most recently were to take 10 mg PO daily and then 5 mg only every 3 daily. He states that he may have taken too much or not enough some day. INR today is 5.3. No obvious blood loss noted. No abnormal bruising, no petechiae.  No fever, chills, n/v, cough, rash, dizziness, SOB, chest pain, palpitations, abdominal pain or changes in bowel or bladder habits.  He has mild swelling in the left leg and plans to restart wearing his compression stocking daily to help reduce fluid retention.  No falls or syncope reported.  Appetite and hydration are good. Weight is stable at 174 lbs.   ECOG Performance Status: 1 - Symptomatic but completely ambulatory  Medications:  Allergies as of 08/16/2022       Reactions   Penicillins Hives, Rash   Severe Rash   Penicillin V Potassium Other (See Comments)        Medication List        Accurate as of August 16, 2022 11:17 AM. If you have any questions, ask your nurse or doctor.          atorvastatin 10 MG tablet Commonly known as: LIPITOR Take 1 tablet (10 mg total) by mouth daily.   cholecalciferol 25 MCG (1000 UNIT) tablet Commonly known as: VITAMIN D3 Take 1,000 Units by mouth daily.   fluoruracil 1 % cream Commonly known as: FLUOROPLEX 1 application to affected area   gabapentin 100 MG capsule Commonly known as: NEURONTIN Take 200 mg by mouth  daily.   hydrochlorothiazide 12.5 MG capsule Commonly known as: MICROZIDE TAKE ONE CAPSULE BY MOUTH ONE TIME DAILY   hydrocortisone cream 1 % Apply 1 application topically 2 (two) times daily.   losartan 50 MG tablet Commonly known as: COZAAR TAKE ONE TABLET BY MOUTH ONE TIME DAILY   naproxen sodium 220 MG tablet Commonly known as: ALEVE Take 220 mg by mouth as needed. Takes before playing tennis.   nitroGLYCERIN 2 % ointment Commonly known as: NITROGLYN Apply 0.5 inches topically 4 (four) times daily.   warfarin 5 MG tablet Commonly known as: COUMADIN 07/03/22: Please take Coumadin '10mg'$  by mouth for two days in a row, on the 3rd day take '5mg'$ . Repeat this schedule until next lab check.        Allergies:  Allergies  Allergen Reactions   Penicillins Hives and Rash    Severe Rash    Penicillin V Potassium Other (See Comments)    Past Medical History, Surgical history, Social history, and Family History were reviewed and updated.  Review of Systems: All other 10 point review of systems is negative.   Physical Exam:  vitals were not taken for this visit.   Wt Readings from Last 3 Encounters:  07/19/22 172 lb (78 kg)  06/29/22 172 lb 9.6 oz (78.3 kg)  06/06/22 172 lb (78 kg)    Ocular: Sclerae unicteric, pupils  equal, round and reactive to light Ear-nose-throat: Oropharynx clear, dentition fair Lymphatic: No cervical or supraclavicular adenopathy Lungs no rales or rhonchi, good excursion bilaterally Heart regular rate and rhythm, no murmur appreciated Abd soft, nontender, positive bowel sounds MSK no focal spinal tenderness, no joint edema Neuro: non-focal, well-oriented, appropriate affect Breasts: Deferred   Lab Results  Component Value Date   WBC 7.3 08/16/2022   HGB 14.5 08/16/2022   HCT 44.0 08/16/2022   MCV 96.3 08/16/2022   PLT 223 08/16/2022   No results found for: "FERRITIN", "IRON", "TIBC", "UIBC", "IRONPCTSAT" Lab Results  Component Value  Date   RBC 4.57 08/16/2022   No results found for: "KPAFRELGTCHN", "LAMBDASER", "KAPLAMBRATIO" No results found for: "IGGSERUM", "IGA", "IGMSERUM" No results found for: "TOTALPROTELP", "ALBUMINELP", "A1GS", "A2GS", "BETS", "BETA2SER", "GAMS", "MSPIKE", "SPEI"   Chemistry      Component Value Date/Time   NA 139 07/11/2022 1131   K 3.9 07/11/2022 1131   CL 103 07/11/2022 1131   CO2 28 07/11/2022 1131   BUN 19 07/11/2022 1131   CREATININE 0.93 07/11/2022 1131      Component Value Date/Time   CALCIUM 9.2 07/11/2022 1131   ALKPHOS 88 07/11/2022 1131   AST 25 07/11/2022 1131   ALT 21 07/11/2022 1131   BILITOT 0.9 07/11/2022 1131       Impression and Plan: [name] is a very pleasant 71 yo gentleman with left lower extremity DVT felt to be idiopathic.  [place] today showed a nice response to Coumadin.  We discussed his dosing in detail. He will hold his Coumadin for next 3 days starting tomorrow (Thurs-Sat) as he has already taken today's dose this morning. He will restart on Sunday with 5 mg and alternate 5 mg PO with 10 mg PO daily. Her verbalized understanding and agreement with the plan.  We will recheck his INR in one week.  Follow-up in 6 weeks.   Lottie Dawson, NP 10/4/202311:17 AM

## 2022-08-16 NOTE — Telephone Encounter (Signed)
Judson Roch, NP aware of critical high INR and will address during today's office visit. dph

## 2022-08-17 ENCOUNTER — Encounter: Payer: Self-pay | Admitting: Physical Therapy

## 2022-08-17 ENCOUNTER — Ambulatory Visit: Payer: Medicare Other | Admitting: Physical Therapy

## 2022-08-17 DIAGNOSIS — M542 Cervicalgia: Secondary | ICD-10-CM | POA: Diagnosis not present

## 2022-08-17 DIAGNOSIS — M25652 Stiffness of left hip, not elsewhere classified: Secondary | ICD-10-CM

## 2022-08-17 DIAGNOSIS — M6281 Muscle weakness (generalized): Secondary | ICD-10-CM | POA: Diagnosis not present

## 2022-08-17 DIAGNOSIS — R293 Abnormal posture: Secondary | ICD-10-CM | POA: Diagnosis not present

## 2022-08-17 DIAGNOSIS — G8929 Other chronic pain: Secondary | ICD-10-CM | POA: Diagnosis not present

## 2022-08-17 DIAGNOSIS — M545 Low back pain, unspecified: Secondary | ICD-10-CM | POA: Diagnosis not present

## 2022-08-17 NOTE — Therapy (Signed)
OUTPATIENT PHYSICAL THERAPY THORACOLUMBAR TREATMENT   Patient Name: Raymond Bevens Sr. MRN: [id number] DOB:[birth year], 71 y.o., male Today's Date: 08/17/2022  .       PT End of Session - 08/17/22 1258     Visit Number 14    Date for PT Re-Evaluation 09/12/22    PT Start Time 1300    PT Stop Time 1829    PT Time Calculation (min) 45 min    Activity Tolerance Patient tolerated treatment well    Behavior During Therapy WFL for tasks assessed/performed               Past Medical History:  Diagnosis Date   DVT (deep venous thrombosis) ([place])    GERD (gastroesophageal reflux disease)    Hyperlipidemia    Hypertension    Leg DVT (deep venous thromboembolism), chronic, left ([place]) 12/12/2021   Past Surgical History:  Procedure Laterality Date   EYE SURGERY     Patient Active Problem List   Diagnosis Date Noted   Pain of right lower extremity due to ischemia 05/01/2022   Leg DVT (deep venous thromboembolism), chronic, left ([place]) 12/12/2021   Mixed hyperlipidemia 11/21/2021   Low libido 11/21/2021   Low back pain 11/21/2021   Loss of appetite 11/21/2021   Hypertensive retinopathy 11/21/2021   Essential hypertension 11/21/2021   Chronic pain 11/21/2021   Enlarged prostate 06/20/2021   Degenerative disc disease, cervical 12/03/2020   Degenerative lumbar disc 09/27/2020   Degenerative arthritis of left knee 09/27/2020    PCP: Valere Dross, FNP  REFERRING PROVIDER: Creig Hines  REFERRING DIAG: Back pain and neck pain  Rationale for Evaluation and Treatment Rehabilitation  THERAPY DIAG:  Chronic bilateral low back pain without sciatica  Cervicalgia  Muscle weakness (generalized)  Stiffness of left hip, not elsewhere classified  Abnormal posture  ONSET DATE: 02/12/22  SUBJECTIVE:                                                                                                                                                                                            SUBJECTIVE STATEMENT:  A little stiff today from this morning tennis workout  PERTINENT HISTORY:  Multiple DVT, GERD  PAIN:  Are you having pain? Yes: NPRS scale: 3/10 Pain location: L shoulder Pain description: sharp  Aggravating factors: lifting arm Relieving factors: not sure as it just started    PRECAUTIONS: None  WEIGHT BEARING RESTRICTIONS No  FALLS:  Has patient fallen in last 6 months? Yes. Number of falls 1  LIVING ENVIRONMENT: Lives with: lives with their family and lives alone Lives in: House/apartment Stairs: Yes: Internal: 12  steps; can reach both Has following equipment at home: None  OCCUPATION: retired  PLOF: Independent and plays tennis 2-3x/week, has not played recently due to the DVT  PATIENT GOALS have less pain, be stronger, continue tennis, be active, more flexible   OBJECTIVE:   DIAGNOSTIC FINDINGS:  HNP in the lumbar area  MUSCLE LENGTH: Hamstrings: Right 50 deg; Left 50 deg Very tight piriformis on the   9/18- moderate limitation in b hip flexors and quads, piriformis mild limitation, HS severe limitation B   POSTURE: rounded shoulders and forward head  PALPATION: Tight in the cervical area, the upper traps and the lumbar area  LUMBAR ROM:   all feel tight per patient,   Active  A/PROM  eval 9/18 08/17/22  Flexion Decreased 50% 30% limited  WFL  Extension 50% 50% limited  25% limited  Right lateral flexion 50% 30% limited  25% limited  Left lateral flexion 50% 30% limited  limited   30% limited  Right rotation     Left rotation      (Blank rows = not tested) Cervical ROM;  flexion WNL's, all other motions decreased 50% with c/o tightness; 9/18 still 50% limited in lateral flexion, L 25% limited/R 50% limited in cervical rotation, flexion/extension WNL   LOWER EXTREMITY ROM:   WFL's, shoulder WFL's  LOWER EXTREMITY MMT:  shoulders left is 4-/5 due to a RC injury in the left shoulder, LE's 4+/5  9/18 Shoulder flexion:  4+/5 B, shoulder ABD 4+/5 B, ER R 5/5 L 4/5, IR R 5/5 L 4-/5, shoulder extension 4+/5  Ankle DF: 5/5 B, quads 5/5 B, hams 5/5 B, hip flexors 4+/5 B, hip ABD 5/5 B, hip extension 5/5 B   FUNCTIONAL TESTS:  Timed up and go (TUG): 15  GAIT: Reports fatigue with walking  miles fatigue  TODAY'S TREATMENT  08/17/22 NuStep L5 x 6 min  Hamstring curls 25lb 2x15  Leg Ext 10lb 2x15  Leg press 50lb 2x15 AR press 10lb x10 each  Rows 25# x10 x15 Lats 25lb 2x15 Shoulder flex 3lb x10 Shoulder abd 2lb x10 Shoulder Er yellow 2x10, limited ROM LUE  LE on Pball small bridges, oblq, K2C  Stretching Hamstring, Single K2C, Piriformis    08/14/22 NuStep L5 x 6 min  UBE L2 x 2 min each  Leg press 40lb 2x15 Rows 20# 2x15 Lats 20# 2x15 Back to wall red tband ER W backs  Shoulder flex 2lb 2x10 Shoulder abd 2lb 2x10 Stretching Hamstring, Single K2C, Piriformis   9/28  TherEx:  Scifit bike L5 x6 minutes SKTC stretch 5x10 second holds B Lumbar rotation stretch 5x10 seconds B HS stretches 3x30 seconds B  Piriformis stretches 3x30 seconds B  3D hip excursions x20 each way   Hip flexor stretches 2x30 seconds B   Education on shoulder management/use of OTC pain control as cleared by PCP, needs to get shoulder formally evaluated by ortho doc if not better in 5 days- would not delay care. Encouraged starting independent strengthening program on his own. Education on importance of balanced mobility and strength work- improving overall mobility will reduce injury risk especially if done before and after tennis.              08/09/22 NuStep Level 6 x 6 minutes Bike Level 4 x 6 minutes Rows 20# 2x15 Lats 20# 2x15 Back to wall red tband ER Back to wall weighted ball overhead lifts W backs 40# leg press 2x15 PROM of the LE's  PATIENT EDUCATION:  Education details: HS and piriformis stretch Person educated: Patient Education method: Explanation, Demonstration, and Verbal cues Education  comprehension: verbalized understanding   HOME EXERCISE PROGRAM: As above  ASSESSMENT:  CLINICAL IMPRESSION:  Channing arrives today doing OK, He has progressed increasing his lumbar AROM ins most directions..We focused total body strengthening and low body flexibility today. L piriformins is really tight compared to R. Postural cues required with seated rows. Cues for cor engagement needed with bridges.Will continue to progress as able and tolerated.   OBJECTIVE IMPAIRMENTS Abnormal gait, cardiopulmonary status limiting activity, decreased activity tolerance, decreased balance, decreased endurance, decreased mobility, difficulty walking, decreased ROM, decreased strength, impaired perceived functional ability, increased muscle spasms, impaired flexibility, postural dysfunction, and pain.   REHAB POTENTIAL: Good  CLINICAL DECISION MAKING: Stable/uncomplicated  EVALUATION COMPLEXITY: Low   GOALS: Goals reviewed with patient? Yes  SHORT TERM GOALS: Target date: 06/27/22  Independent with initial HEP Goal status: met  LONG TERM GOALS: Target date: 09/04/22  Indepednetn with advanced HEP Goal status: ongoing  2.  Understand posture and body mechanics   Goal status: 10/5-  progressing poor biomechanics   3.  Decreae pain overall 50% Goal status:9/18- going well, MET   4.  Return to playing tennis without issue Goal status: 9/18- for issues he came to PT for- very hard to answer multiple health issues in the picture DEFERRED GOAL    PLAN: PT FREQUENCY: 1-2x/week  PT DURATION: 12 weeks  PLANNED INTERVENTIONS: Therapeutic exercises, Therapeutic activity, Neuromuscular re-education, Balance training, Gait training, Patient/Family education, Self Care, Joint mobilization, Dry Needling, Electrical stimulation, Spinal mobilization, Cryotherapy, Moist heat, Taping, Traction, and Manual therapy.  PLAN FOR NEXT SESSION:  postural and biomechanics training, keep working on mobility  and flexibility strength is better. Monitor shoulder pain   Cheri Fowler, PTA 08/17/2022, 12:59 PM

## 2022-08-21 ENCOUNTER — Ambulatory Visit: Payer: Medicare Other | Admitting: Physical Therapy

## 2022-08-21 ENCOUNTER — Encounter: Payer: Self-pay | Admitting: Physical Therapy

## 2022-08-21 DIAGNOSIS — M545 Low back pain, unspecified: Secondary | ICD-10-CM | POA: Diagnosis not present

## 2022-08-21 DIAGNOSIS — R293 Abnormal posture: Secondary | ICD-10-CM

## 2022-08-21 DIAGNOSIS — M6281 Muscle weakness (generalized): Secondary | ICD-10-CM

## 2022-08-21 DIAGNOSIS — M542 Cervicalgia: Secondary | ICD-10-CM

## 2022-08-21 DIAGNOSIS — G8929 Other chronic pain: Secondary | ICD-10-CM

## 2022-08-21 DIAGNOSIS — M25652 Stiffness of left hip, not elsewhere classified: Secondary | ICD-10-CM

## 2022-08-21 NOTE — Therapy (Signed)
OUTPATIENT PHYSICAL THERAPY THORACOLUMBAR TREATMENT   Patient Name: Raymond Wilner Sr. MRN: [id number] DOB:Jul 16, 1951, 71 y.o., male Today's Date: 08/21/2022  .       PT End of Session - 08/21/22 1400     Visit Number 15    Date for PT Re-Evaluation 09/12/22    Authorization Type Medicare    PT Start Time 1355    PT Stop Time 1440    PT Time Calculation (min) 45 min    Activity Tolerance Patient tolerated treatment well    Behavior During Therapy WFL for tasks assessed/performed               Past Medical History:  Diagnosis Date   DVT (deep venous thrombosis) ([place])    GERD (gastroesophageal reflux disease)    Hyperlipidemia    Hypertension    Leg DVT (deep venous thromboembolism), chronic, left ([place]) 12/12/2021   Past Surgical History:  Procedure Laterality Date   EYE SURGERY     Patient Active Problem List   Diagnosis Date Noted   Pain of right lower extremity due to ischemia 05/01/2022   Leg DVT (deep venous thromboembolism), chronic, left ([place]) 12/12/2021   Mixed hyperlipidemia 11/21/2021   Low libido 11/21/2021   Low back pain 11/21/2021   Loss of appetite 11/21/2021   Hypertensive retinopathy 11/21/2021   Essential hypertension 11/21/2021   Chronic pain 11/21/2021   Enlarged prostate 06/20/2021   Degenerative disc disease, cervical 12/03/2020   Degenerative lumbar disc 09/27/2020   Degenerative arthritis of left knee 09/27/2020    PCP: Valere Dross, FNP  REFERRING PROVIDER: Creig Hines  REFERRING DIAG: Back pain and neck pain  Rationale for Evaluation and Treatment Rehabilitation  THERAPY DIAG:  Chronic bilateral low back pain without sciatica  Cervicalgia  Muscle weakness (generalized)  Stiffness of left hip, not elsewhere classified  Abnormal posture  ONSET DATE: 02/12/22  SUBJECTIVE:                                                                                                                                                                                            SUBJECTIVE STATEMENT:  I am feeling better overall, I think this is really helping all around  PERTINENT HISTORY:  Multiple DVT, GERD  PAIN:  Are you having pain? Yes: NPRS scale: 3/10 Pain location: L shoulder Pain description: sharp  Aggravating factors: lifting arm Relieving factors: not sure as it just started    PRECAUTIONS: None  WEIGHT BEARING RESTRICTIONS No  FALLS:  Has patient fallen in last 6 months? Yes. Number of falls 1  LIVING ENVIRONMENT: Lives with: lives with their family  and lives alone Lives in: House/apartment Stairs: Yes: Internal: 12 steps; can reach both Has following equipment at home: None  OCCUPATION: retired  PLOF: Independent and plays tennis 2-3x/week, has not played recently due to the DVT  PATIENT GOALS have less pain, be stronger, continue tennis, be active, more flexible   OBJECTIVE:   DIAGNOSTIC FINDINGS:  HNP in the lumbar area  MUSCLE LENGTH: Hamstrings: Right 50 deg; Left 50 deg Very tight piriformis on the   9/18- moderate limitation in b hip flexors and quads, piriformis mild limitation, HS severe limitation B   POSTURE: rounded shoulders and forward head  PALPATION: Tight in the cervical area, the upper traps and the lumbar area  LUMBAR ROM:   all feel tight per patient,   Active  A/PROM  eval 9/18 08/17/22  Flexion Decreased 50% 30% limited  WFL  Extension 50% 50% limited  25% limited  Right lateral flexion 50% 30% limited  25% limited  Left lateral flexion 50% 30% limited  limited   30% limited  Right rotation     Left rotation      (Blank rows = not tested) Cervical ROM;  flexion WNL's, all other motions decreased 50% with c/o tightness; 9/18 still 50% limited in lateral flexion, L 25% limited/R 50% limited in cervical rotation, flexion/extension WNL   LOWER EXTREMITY ROM:   WFL's, shoulder WFL's  LOWER EXTREMITY MMT:  shoulders left is 4-/5 due to a RC injury in the left  shoulder, LE's 4+/5  9/18 Shoulder flexion: 4+/5 B, shoulder ABD 4+/5 B, ER R 5/5 L 4/5, IR R 5/5 L 4-/5, shoulder extension 4+/5  Ankle DF: 5/5 B, quads 5/5 B, hams 5/5 B, hip flexors 4+/5 B, hip ABD 5/5 B, hip extension 5/5 B   FUNCTIONAL TESTS:  Timed up and go (TUG): 15  GAIT: Reports fatigue with walking  miles fatigue  TODAY'S TREATMENT  08/21/22 Nustep level 5 x 6 minutes Bike Level 4 x 6 minutes UBE level 3 x 4 minutes Calf stretches W backs Seated rows 25# 2x15 Lats 25# 2x15 Resisted gait all directions 40# Weighted ball overhead lift 10# straight arm pulls Leg press 50#  Passive LE stretches   08/17/22 NuStep L5 x 6 min  Hamstring curls 25lb 2x15  Leg Ext 10lb 2x15  Leg press 50lb 2x15 AR press 10lb x10 each  Rows 25# x10 x15 Lats 25lb 2x15 Shoulder flex 3lb x10 Shoulder abd 2lb x10 Shoulder Er yellow 2x10, limited ROM LUE  LE on Pball small bridges, oblq, K2C  Stretching Hamstring, Single K2C, Piriformis    08/14/22 NuStep L5 x 6 min  UBE L2 x 2 min each  Leg press 40lb 2x15 Rows 20# 2x15 Lats 20# 2x15 Back to wall red tband ER W backs  Shoulder flex 2lb 2x10 Shoulder abd 2lb 2x10 Stretching Hamstring, Single K2C, Piriformis   9/28  TherEx:  Scifit bike L5 x6 minutes SKTC stretch 5x10 second holds B Lumbar rotation stretch 5x10 seconds B HS stretches 3x30 seconds B  Piriformis stretches 3x30 seconds B  3D hip excursions x20 each way   Hip flexor stretches 2x30 seconds B   Education on shoulder management/use of OTC pain control as cleared by PCP, needs to get shoulder formally evaluated by ortho doc if not better in 5 days- would not delay care. Encouraged starting independent strengthening program on his own. Education on importance of balanced mobility and strength work- improving overall mobility will reduce injury risk especially if  done before and after tennis.              08/09/22 NuStep Level 6 x 6 minutes Bike Level 4 x 6  minutes Rows 20# 2x15 Lats 20# 2x15 Back to wall red tband ER Back to wall weighted ball overhead lifts W backs 40# leg press 2x15 PROM of the LE's    PATIENT EDUCATION:  Education details: HS and piriformis stretch Person educated: Patient Education method: Consulting civil engineer, Demonstration, and Verbal cues Education comprehension: verbalized understanding   HOME EXERCISE PROGRAM: As above  ASSESSMENT:  CLINICAL IMPRESSION:  Feels this has really helped him, he and I talked about the end of PT and what his plans are, he is checking on gyms and trainers   OBJECTIVE IMPAIRMENTS Abnormal gait, cardiopulmonary status limiting activity, decreased activity tolerance, decreased balance, decreased endurance, decreased mobility, difficulty walking, decreased ROM, decreased strength, impaired perceived functional ability, increased muscle spasms, impaired flexibility, postural dysfunction, and pain.   REHAB POTENTIAL: Good  CLINICAL DECISION MAKING: Stable/uncomplicated  EVALUATION COMPLEXITY: Low   GOALS: Goals reviewed with patient? Yes  SHORT TERM GOALS: Target date: 06/27/22  Independent with initial HEP Goal status: met  LONG TERM GOALS: Target date: 09/04/22  Indepednetn with advanced HEP Goal status: ongoing  2.  Understand posture and body mechanics   Goal status: 10/5-  progressing poor biomechanics   3.  Decreae pain overall 50% Goal status:9/18- going well, MET   4.  Return to playing tennis without issue Goal status: 9/18- for issues he came to PT for- very hard to answer multiple health issues in the picture DEFERRED GOAL    PLAN: PT FREQUENCY: 1-2x/week  PT DURATION: 12 weeks  PLANNED INTERVENTIONS: Therapeutic exercises, Therapeutic activity, Neuromuscular re-education, Balance training, Gait training, Patient/Family education, Self Care, Joint mobilization, Dry Needling, Electrical stimulation, Spinal mobilization, Cryotherapy, Moist heat, Taping,  Traction, and Manual therapy.  PLAN FOR NEXT SESSION:  postural and biomechanics training, keep working on mobility and flexibility strength is better. Monitor shoulder pain   Lum Babe, PT 08/21/2022, 2:01 PM

## 2022-08-22 ENCOUNTER — Other Ambulatory Visit: Payer: Self-pay

## 2022-08-22 DIAGNOSIS — I82502 Chronic embolism and thrombosis of unspecified deep veins of left lower extremity: Secondary | ICD-10-CM

## 2022-08-22 MED ORDER — WARFARIN SODIUM 5 MG PO TABS: ORAL_TABLET | ORAL | 2 refills | Status: DC

## 2022-08-23 ENCOUNTER — Inpatient Hospital Stay: Payer: Medicare Other

## 2022-08-23 DIAGNOSIS — I82502 Chronic embolism and thrombosis of unspecified deep veins of left lower extremity: Secondary | ICD-10-CM

## 2022-08-23 DIAGNOSIS — Z7901 Long term (current) use of anticoagulants: Secondary | ICD-10-CM | POA: Diagnosis not present

## 2022-08-23 DIAGNOSIS — I82412 Acute embolism and thrombosis of left femoral vein: Secondary | ICD-10-CM | POA: Diagnosis not present

## 2022-08-23 DIAGNOSIS — Z86718 Personal history of other venous thrombosis and embolism: Secondary | ICD-10-CM

## 2022-08-23 LAB — PROTIME-INR
INR: 1.7 — ABNORMAL HIGH (ref 0.8–1.2)
Prothrombin Time: 20.1 seconds — ABNORMAL HIGH (ref 11.4–15.2)

## 2022-08-23 LAB — CBC WITH DIFFERENTIAL (CANCER CENTER ONLY)
Abs Immature Granulocytes: 0.02 10*3/uL (ref 0.00–0.07)
Basophils Absolute: 0.1 10*3/uL (ref 0.0–0.1)
Basophils Relative: 1 %
Eosinophils Absolute: 0.2 10*3/uL (ref 0.0–0.5)
Eosinophils Relative: 2 %
HCT: 43.1 % (ref 39.0–52.0)
Hemoglobin: 14.3 g/dL (ref 13.0–17.0)
Immature Granulocytes: 0 %
Lymphocytes Relative: 33 %
Lymphs Abs: 2.8 10*3/uL (ref 0.7–4.0)
MCH: 32 pg (ref 26.0–34.0)
MCHC: 33.2 g/dL (ref 30.0–36.0)
MCV: 96.4 fL (ref 80.0–100.0)
Monocytes Absolute: 0.7 10*3/uL (ref 0.1–1.0)
Monocytes Relative: 8 %
Neutro Abs: 4.8 10*3/uL (ref 1.7–7.7)
Neutrophils Relative %: 56 %
Platelet Count: 204 10*3/uL (ref 150–400)
RBC: 4.47 MIL/uL (ref 4.22–5.81)
RDW: 13.9 % (ref 11.5–15.5)
WBC Count: 8.4 10*3/uL (ref 4.0–10.5)
nRBC: 0 % (ref 0.0–0.2)

## 2022-08-28 ENCOUNTER — Telehealth: Payer: Self-pay | Admitting: *Deleted

## 2022-08-28 NOTE — Telephone Encounter (Signed)
-----   Message from Celso Amy, NP sent at 08/28/2022  3:17 PM EDT ----- Did anyone call him last week about his INR. How much Coumadin is he taking? He needs another check this week.  Sarah  ----- Message ----- From: Buel Ream, [facility] [place] Sent: 08/23/2022   1:26 PM EDT To: Celso Amy, NP

## 2022-08-28 NOTE — Telephone Encounter (Signed)
This nurse called patient and left a voice mail message asking him how much Coumadin he is taking? We need to recheck your labs this week. Please call the office back.

## 2022-08-29 ENCOUNTER — Ambulatory Visit: Payer: Medicare Other | Admitting: Physical Therapy

## 2022-08-30 ENCOUNTER — Ambulatory Visit: Payer: Medicare Other | Admitting: Physical Therapy

## 2022-08-30 ENCOUNTER — Telehealth: Payer: Self-pay

## 2022-08-30 ENCOUNTER — Encounter: Payer: Self-pay | Admitting: Physical Therapy

## 2022-08-30 DIAGNOSIS — M545 Low back pain, unspecified: Secondary | ICD-10-CM | POA: Diagnosis not present

## 2022-08-30 DIAGNOSIS — G8929 Other chronic pain: Secondary | ICD-10-CM

## 2022-08-30 DIAGNOSIS — R293 Abnormal posture: Secondary | ICD-10-CM | POA: Diagnosis not present

## 2022-08-30 DIAGNOSIS — M25652 Stiffness of left hip, not elsewhere classified: Secondary | ICD-10-CM | POA: Diagnosis not present

## 2022-08-30 DIAGNOSIS — M6281 Muscle weakness (generalized): Secondary | ICD-10-CM | POA: Diagnosis not present

## 2022-08-30 DIAGNOSIS — M542 Cervicalgia: Secondary | ICD-10-CM

## 2022-08-30 NOTE — Therapy (Signed)
OUTPATIENT PHYSICAL THERAPY THORACOLUMBAR TREATMENT   Patient Name: Raymond Gassett Sr. MRN: [id number] DOB:1951-04-23, 71 y.o., male Today's Date: 08/30/2022  .       PT End of Session - 08/30/22 1433     Date for PT Re-Evaluation 09/12/22    PT Start Time 1430    PT Stop Time 1515    PT Time Calculation (min) 45 min    Activity Tolerance Patient tolerated treatment well    Behavior During Therapy WFL for tasks assessed/performed               Past Medical History:  Diagnosis Date   DVT (deep venous thrombosis) ([place])    GERD (gastroesophageal reflux disease)    Hyperlipidemia    Hypertension    Leg DVT (deep venous thromboembolism), chronic, left ([place]) 12/12/2021   Past Surgical History:  Procedure Laterality Date   EYE SURGERY     Patient Active Problem List   Diagnosis Date Noted   Pain of right lower extremity due to ischemia 05/01/2022   Leg DVT (deep venous thromboembolism), chronic, left ([place]) 12/12/2021   Mixed hyperlipidemia 11/21/2021   Low libido 11/21/2021   Low back pain 11/21/2021   Loss of appetite 11/21/2021   Hypertensive retinopathy 11/21/2021   Essential hypertension 11/21/2021   Chronic pain 11/21/2021   Enlarged prostate 06/20/2021   Degenerative disc disease, cervical 12/03/2020   Degenerative lumbar disc 09/27/2020   Degenerative arthritis of left knee 09/27/2020    PCP: Valere Dross, FNP  REFERRING PROVIDER: Creig Hines  REFERRING DIAG: Back pain and neck pain  Rationale for Evaluation and Treatment Rehabilitation  THERAPY DIAG:  Chronic bilateral low back pain without sciatica  Cervicalgia  Muscle weakness (generalized)  ONSET DATE: 02/12/22  SUBJECTIVE:                                                                                                                                                                                           SUBJECTIVE STATEMENT:  I am feeling better overall, I think this is really helping  all around  PERTINENT HISTORY:  Multiple DVT, GERD  PAIN:  Are you having pain? Yes: NPRS scale: 3/10 Pain location: L shoulder Pain description: sharp  Aggravating factors: lifting arm Relieving factors: not sure as it just started    PRECAUTIONS: None  WEIGHT BEARING RESTRICTIONS No  FALLS:  Has patient fallen in last 6 months? Yes. Number of falls 1  LIVING ENVIRONMENT: Lives with: lives with their family and lives alone Lives in: House/apartment Stairs: Yes: Internal: 12 steps; can reach both Has following equipment at home: None  OCCUPATION: retired  PLOF: Independent and plays tennis 2-3x/week, has not played recently due to the DVT  PATIENT GOALS have less pain, be stronger, continue tennis, be active, more flexible   OBJECTIVE:   DIAGNOSTIC FINDINGS:  HNP in the lumbar area  MUSCLE LENGTH: Hamstrings: Right 50 deg; Left 50 deg Very tight piriformis on the   9/18- moderate limitation in b hip flexors and quads, piriformis mild limitation, HS severe limitation B   POSTURE: rounded shoulders and forward head  PALPATION: Tight in the cervical area, the upper traps and the lumbar area  LUMBAR ROM:   all feel tight per patient,   Active  A/PROM  eval 9/18 08/17/22  Flexion Decreased 50% 30% limited  WFL  Extension 50% 50% limited  25% limited  Right lateral flexion 50% 30% limited  25% limited  Left lateral flexion 50% 30% limited  limited   30% limited  Right rotation     Left rotation      (Blank rows = not tested) Cervical ROM;  flexion WNL's, all other motions decreased 50% with c/o tightness; 9/18 still 50% limited in lateral flexion, L 25% limited/R 50% limited in cervical rotation, flexion/extension WNL   LOWER EXTREMITY ROM:   WFL's, shoulder WFL's  LOWER EXTREMITY MMT:  shoulders left is 4-/5 due to a RC injury in the left shoulder, LE's 4+/5  9/18 Shoulder flexion: 4+/5 B, shoulder ABD 4+/5 B, ER R 5/5 L 4/5, IR R 5/5 L 4-/5, shoulder  extension 4+/5  Ankle DF: 5/5 B, quads 5/5 B, hams 5/5 B, hip flexors 4+/5 B, hip ABD 5/5 B, hip extension 5/5 B   FUNCTIONAL TESTS:  Timed up and go (TUG): 15  GAIT: Reports fatigue with walking  miles fatigue  TODAY'S TREATMENT  08/30/22 NuStep L5 x 6 min UBE level 3 x 4 minutes Resisted gait all directions 50# Calf stretches Seated rows 25# 2x15 Shoulder Ext 10lb 2x10 S2S OHP yellow ball 2x10 Passive LE stretches  08/21/22 Nustep level 5 x 6 minutes Bike Level 4 x 6 minutes UBE level 3 x 4 minutes Calf stretches W backs Seated rows 25# 2x15 Lats 25# 2x15 Resisted gait all directions 40# Weighted ball overhead lift 10# straight arm pulls Leg press 50#  Passive LE stretches   08/17/22 NuStep L5 x 6 min  Hamstring curls 25lb 2x15  Leg Ext 10lb 2x15  Leg press 50lb 2x15 AR press 10lb x10 each  Rows 25# x10 x15 Lats 25lb 2x15 Shoulder flex 3lb x10 Shoulder abd 2lb x10 Shoulder Er yellow 2x10, limited ROM LUE  LE on Pball small bridges, oblq, K2C  Stretching Hamstring, Single K2C, Piriformis    08/14/22 NuStep L5 x 6 min  UBE L2 x 2 min each  Leg press 40lb 2x15 Rows 20# 2x15 Lats 20# 2x15 Back to wall red tband ER W backs  Shoulder flex 2lb 2x10 Shoulder abd 2lb 2x10 Stretching Hamstring, Single K2C, Piriformis     PATIENT EDUCATION:  Education details: HS and piriformis stretch Person educated: Patient Education method: Explanation, Demonstration, and Verbal cues Education comprehension: verbalized understanding   HOME EXERCISE PROGRAM: As above  ASSESSMENT:  CLINICAL IMPRESSION:  Pt continues to report improvement from therapy. He reports that he has found a trainer and ending therapy soon. No issue during session, slight HS tightness noted   OBJECTIVE IMPAIRMENTS Abnormal gait, cardiopulmonary status limiting activity, decreased activity tolerance, decreased balance, decreased endurance, decreased mobility, difficulty walking, decreased  ROM, decreased strength, impaired perceived functional  ability, increased muscle spasms, impaired flexibility, postural dysfunction, and pain.   REHAB POTENTIAL: Good  CLINICAL DECISION MAKING: Stable/uncomplicated  EVALUATION COMPLEXITY: Low   GOALS: Goals reviewed with patient? Yes  SHORT TERM GOALS: Target date: 06/27/22  Independent with initial HEP Goal status: met  LONG TERM GOALS: Target date: 09/04/22  Indepednetn with advanced HEP Goal status: ongoing  2.  Understand posture and body mechanics   Goal status: 10/5-  progressing poor biomechanics   3.  Decreae pain overall 50% Goal status:9/18- going well, MET   4.  Return to playing tennis without issue Goal status: 9/18- for issues he came to PT for- very hard to answer multiple health issues in the picture DEFERRED GOAL    PLAN: PT FREQUENCY: 1-2x/week  PT DURATION: 12 weeks  PLANNED INTERVENTIONS: Therapeutic exercises, Therapeutic activity, Neuromuscular re-education, Balance training, Gait training, Patient/Family education, Self Care, Joint mobilization, Dry Needling, Electrical stimulation, Spinal mobilization, Cryotherapy, Moist heat, Taping, Traction, and Manual therapy.  PLAN FOR NEXT SESSION:  postural and biomechanics training, keep working on mobility and flexibility strength is better. Monitor shoulder pain   Lum Babe, PT 08/30/2022, 2:35 PM

## 2022-08-30 NOTE — Telephone Encounter (Signed)
Received VM from pt confirming he is currently alternating Coumadin '5mg'$  with '10mg'$  every other day. Message sent to scheduling for lab appt this week. dph

## 2022-08-31 ENCOUNTER — Inpatient Hospital Stay: Payer: Medicare Other

## 2022-08-31 DIAGNOSIS — Z7901 Long term (current) use of anticoagulants: Secondary | ICD-10-CM | POA: Diagnosis not present

## 2022-08-31 DIAGNOSIS — I82502 Chronic embolism and thrombosis of unspecified deep veins of left lower extremity: Secondary | ICD-10-CM

## 2022-08-31 DIAGNOSIS — I82412 Acute embolism and thrombosis of left femoral vein: Secondary | ICD-10-CM | POA: Diagnosis not present

## 2022-08-31 DIAGNOSIS — Z86718 Personal history of other venous thrombosis and embolism: Secondary | ICD-10-CM

## 2022-08-31 LAB — CBC WITH DIFFERENTIAL (CANCER CENTER ONLY)
Abs Immature Granulocytes: 0.02 10*3/uL (ref 0.00–0.07)
Basophils Absolute: 0.1 10*3/uL (ref 0.0–0.1)
Basophils Relative: 1 %
Eosinophils Absolute: 0.2 10*3/uL (ref 0.0–0.5)
Eosinophils Relative: 2 %
HCT: 43.8 % (ref 39.0–52.0)
Hemoglobin: 14.3 g/dL (ref 13.0–17.0)
Immature Granulocytes: 0 %
Lymphocytes Relative: 28 %
Lymphs Abs: 2.3 10*3/uL (ref 0.7–4.0)
MCH: 31.9 pg (ref 26.0–34.0)
MCHC: 32.6 g/dL (ref 30.0–36.0)
MCV: 97.8 fL (ref 80.0–100.0)
Monocytes Absolute: 0.7 10*3/uL (ref 0.1–1.0)
Monocytes Relative: 8 %
Neutro Abs: 4.9 10*3/uL (ref 1.7–7.7)
Neutrophils Relative %: 61 %
Platelet Count: 196 10*3/uL (ref 150–400)
RBC: 4.48 MIL/uL (ref 4.22–5.81)
RDW: 14.3 % (ref 11.5–15.5)
WBC Count: 8.1 10*3/uL (ref 4.0–10.5)
nRBC: 0 % (ref 0.0–0.2)

## 2022-08-31 LAB — PROTIME-INR
INR: 3.2 — ABNORMAL HIGH (ref 0.8–1.2)
Prothrombin Time: 32.8 seconds — ABNORMAL HIGH (ref 11.4–15.2)

## 2022-09-05 ENCOUNTER — Ambulatory Visit: Payer: Medicare Other | Admitting: Physical Therapy

## 2022-09-05 ENCOUNTER — Encounter: Payer: Self-pay | Admitting: Physical Therapy

## 2022-09-05 DIAGNOSIS — R293 Abnormal posture: Secondary | ICD-10-CM | POA: Diagnosis not present

## 2022-09-05 DIAGNOSIS — M6281 Muscle weakness (generalized): Secondary | ICD-10-CM | POA: Diagnosis not present

## 2022-09-05 DIAGNOSIS — M25652 Stiffness of left hip, not elsewhere classified: Secondary | ICD-10-CM | POA: Diagnosis not present

## 2022-09-05 DIAGNOSIS — M545 Low back pain, unspecified: Secondary | ICD-10-CM | POA: Diagnosis not present

## 2022-09-05 DIAGNOSIS — M542 Cervicalgia: Secondary | ICD-10-CM

## 2022-09-05 DIAGNOSIS — G8929 Other chronic pain: Secondary | ICD-10-CM | POA: Diagnosis not present

## 2022-09-05 NOTE — Therapy (Signed)
OUTPATIENT PHYSICAL THERAPY THORACOLUMBAR TREATMENT   Patient Name: Raymond Fahr Sr. MRN: [id number] DOB:08-12-1951, 71 y.o., male Today's Date: 09/05/2022  .       PT End of Session - 09/05/22 0852     Visit Number 16    Date for PT Re-Evaluation 09/12/22    Authorization Type Medicare    PT Start Time 0842    PT Stop Time 0928    PT Time Calculation (min) 46 min    Activity Tolerance Patient tolerated treatment well    Behavior During Therapy [facility] for tasks assessed/performed               Past Medical History:  Diagnosis Date   DVT (deep venous thrombosis) ([place])    GERD (gastroesophageal reflux disease)    Hyperlipidemia    Hypertension    Leg DVT (deep venous thromboembolism), chronic, left ([place]) 12/12/2021   Past Surgical History:  Procedure Laterality Date   EYE SURGERY     Patient Active Problem List   Diagnosis Date Noted   Pain of right lower extremity due to ischemia 05/01/2022   Leg DVT (deep venous thromboembolism), chronic, left ([place]) 12/12/2021   Mixed hyperlipidemia 11/21/2021   Low libido 11/21/2021   Low back pain 11/21/2021   Loss of appetite 11/21/2021   Hypertensive retinopathy 11/21/2021   Essential hypertension 11/21/2021   Chronic pain 11/21/2021   Enlarged prostate 06/20/2021   Degenerative disc disease, cervical 12/03/2020   Degenerative lumbar disc 09/27/2020   Degenerative arthritis of left knee 09/27/2020    PCP: Valere Dross, FNP  REFERRING PROVIDER: Creig Hines  REFERRING DIAG: Back pain and neck pain  Rationale for Evaluation and Treatment Rehabilitation  THERAPY DIAG:  Chronic bilateral low back pain without sciatica  Cervicalgia  Muscle weakness (generalized)  Stiffness of left hip, not elsewhere classified  Abnormal posture  ONSET DATE: 02/12/22  SUBJECTIVE:                                                                                                                                                                                            SUBJECTIVE STATEMENT:  I continue to do well, tennis is not as painful as it was and I am doing okay at it  PERTINENT HISTORY:  Multiple DVT, GERD  PAIN:  Are you having pain? Yes: NPRS scale: 2/10 Pain location: L shoulder Pain description: sharp  Aggravating factors: lifting arm Relieving factors: not sure as it just started    PRECAUTIONS: None  WEIGHT BEARING RESTRICTIONS No  FALLS:  Has patient fallen in last 6 months? Yes. Number of falls 1  LIVING  ENVIRONMENT: Lives with: lives with their family and lives alone Lives in: House/apartment Stairs: Yes: Internal: 12 steps; can reach both Has following equipment at home: None  OCCUPATION: retired  PLOF: Independent and plays tennis 2-3x/week, has not played recently due to the DVT  PATIENT GOALS have less pain, be stronger, continue tennis, be active, more flexible   OBJECTIVE:   DIAGNOSTIC FINDINGS:  HNP in the lumbar area  MUSCLE LENGTH: Hamstrings: Right 50 deg; Left 50 deg Very tight piriformis on the   9/18- moderate limitation in b hip flexors and quads, piriformis mild limitation, HS severe limitation B   POSTURE: rounded shoulders and forward head  PALPATION: Tight in the cervical area, the upper traps and the lumbar area  LUMBAR ROM:   all feel tight per patient,   Active  A/PROM  eval 9/18 08/17/22  Flexion Decreased 50% 30% limited  WFL  Extension 50% 50% limited  25% limited  Right lateral flexion 50% 30% limited  25% limited  Left lateral flexion 50% 30% limited  limited   30% limited  Right rotation     Left rotation      (Blank rows = not tested) Cervical ROM;  flexion WNL's, all other motions decreased 50% with c/o tightness; 9/18 still 50% limited in lateral flexion, L 25% limited/R 50% limited in cervical rotation, flexion/extension WNL   LOWER EXTREMITY ROM:   WFL's, shoulder WFL's  LOWER EXTREMITY MMT:  shoulders left is 4-/5 due to a RC injury  in the left shoulder, LE's 4+/5  9/18 Shoulder flexion: 4+/5 B, shoulder ABD 4+/5 B, ER R 5/5 L 4/5, IR R 5/5 L 4-/5, shoulder extension 4+/5  Ankle DF: 5/5 B, quads 5/5 B, hams 5/5 B, hip flexors 4+/5 B, hip ABD 5/5 B, hip extension 5/5 B   FUNCTIONAL TESTS:  Timed up and go (TUG): 15  GAIT: Reports fatigue with walking  miles fatigue  TODAY'S TREATMENT  09/05/22 Bike Level 5 x 6 minutes UBE level 4 x 6 minutes 40# resisted gait all directions 10# straight arm pulls Calf stretches Rows 25# 2x15 Lats 25# 2x15 Passive stretch of the LE's  08/30/22 NuStep L5 x 6 min UBE level 3 x 4 minutes Resisted gait all directions 50# Calf stretches Seated rows 25# 2x15 Shoulder Ext 10lb 2x10 S2S OHP yellow ball 2x10 Passive LE stretches  08/21/22 Nustep level 5 x 6 minutes Bike Level 4 x 6 minutes UBE level 3 x 4 minutes Calf stretches W backs Seated rows 25# 2x15 Lats 25# 2x15 Resisted gait all directions 40# Weighted ball overhead lift 10# straight arm pulls Leg press 50#  Passive LE stretches   08/17/22 NuStep L5 x 6 min  Hamstring curls 25lb 2x15  Leg Ext 10lb 2x15  Leg press 50lb 2x15 AR press 10lb x10 each  Rows 25# x10 x15 Lats 25lb 2x15 Shoulder flex 3lb x10 Shoulder abd 2lb x10 Shoulder Er yellow 2x10, limited ROM LUE  LE on Pball small bridges, oblq, K2C  Stretching Hamstring, Single K2C, Piriformis    08/14/22 NuStep L5 x 6 min  UBE L2 x 2 min each  Leg press 40lb 2x15 Rows 20# 2x15 Lats 20# 2x15 Back to wall red tband ER W backs  Shoulder flex 2lb 2x10 Shoulder abd 2lb 2x10 Stretching Hamstring, Single K2C, Piriformis     PATIENT EDUCATION:  Education details: HS and piriformis stretch Person educated: Patient Education method: Explanation, Demonstration, and Verbal cues Education comprehension: verbalized understanding  HOME EXERCISE PROGRAM: As above  ASSESSMENT:  CLINICAL IMPRESSION:  Pt continues to report improvement from  therapy. He has not been do gym or trainer yet, he does have a lot of questions about safety  OBJECTIVE IMPAIRMENTS Abnormal gait, cardiopulmonary status limiting activity, decreased activity tolerance, decreased balance, decreased endurance, decreased mobility, difficulty walking, decreased ROM, decreased strength, impaired perceived functional ability, increased muscle spasms, impaired flexibility, postural dysfunction, and pain.   REHAB POTENTIAL: Good  CLINICAL DECISION MAKING: Stable/uncomplicated  EVALUATION COMPLEXITY: Low   GOALS: Goals reviewed with patient? Yes  SHORT TERM GOALS: Target date: 06/27/22  Independent with initial HEP Goal status: met  LONG TERM GOALS: Target date: 09/04/22  Indepednetn with advanced HEP Goal status: ongoing  2.  Understand posture and body mechanics   Goal status: 10/5-  progressing poor biomechanics   3.  Decreae pain overall 50% Goal status:9/18- going well, MET   4.  Return to playing tennis without issue Goal status: 9/18- for issues he came to PT for- very hard to answer multiple health issues in the picture DEFERRED GOAL    PLAN: PT FREQUENCY: 1-2x/week  PT DURATION: 12 weeks  PLANNED INTERVENTIONS: Therapeutic exercises, Therapeutic activity, Neuromuscular re-education, Balance training, Gait training, Patient/Family education, Self Care, Joint mobilization, Dry Needling, Electrical stimulation, Spinal mobilization, Cryotherapy, Moist heat, Taping, Traction, and Manual therapy.  PLAN FOR NEXT SESSION:  D/C next visit after going over the machines and his questions  Lum Babe, PT 09/05/2022, 8:53 AM

## 2022-09-07 ENCOUNTER — Inpatient Hospital Stay: Payer: Medicare Other

## 2022-09-11 ENCOUNTER — Inpatient Hospital Stay: Payer: Medicare Other

## 2022-09-11 ENCOUNTER — Encounter: Payer: Self-pay | Admitting: *Deleted

## 2022-09-11 DIAGNOSIS — C44619 Basal cell carcinoma of skin of left upper limb, including shoulder: Secondary | ICD-10-CM | POA: Diagnosis not present

## 2022-09-11 DIAGNOSIS — I82502 Chronic embolism and thrombosis of unspecified deep veins of left lower extremity: Secondary | ICD-10-CM

## 2022-09-11 DIAGNOSIS — L570 Actinic keratosis: Secondary | ICD-10-CM | POA: Diagnosis not present

## 2022-09-11 DIAGNOSIS — D692 Other nonthrombocytopenic purpura: Secondary | ICD-10-CM | POA: Diagnosis not present

## 2022-09-11 DIAGNOSIS — Z85828 Personal history of other malignant neoplasm of skin: Secondary | ICD-10-CM | POA: Diagnosis not present

## 2022-09-11 DIAGNOSIS — D1801 Hemangioma of skin and subcutaneous tissue: Secondary | ICD-10-CM | POA: Diagnosis not present

## 2022-09-11 DIAGNOSIS — D225 Melanocytic nevi of trunk: Secondary | ICD-10-CM | POA: Diagnosis not present

## 2022-09-11 DIAGNOSIS — Z86718 Personal history of other venous thrombosis and embolism: Secondary | ICD-10-CM

## 2022-09-11 DIAGNOSIS — Z7901 Long term (current) use of anticoagulants: Secondary | ICD-10-CM | POA: Diagnosis not present

## 2022-09-11 DIAGNOSIS — L814 Other melanin hyperpigmentation: Secondary | ICD-10-CM | POA: Diagnosis not present

## 2022-09-11 DIAGNOSIS — L821 Other seborrheic keratosis: Secondary | ICD-10-CM | POA: Diagnosis not present

## 2022-09-11 DIAGNOSIS — I82412 Acute embolism and thrombosis of left femoral vein: Secondary | ICD-10-CM | POA: Diagnosis not present

## 2022-09-11 LAB — CBC WITH DIFFERENTIAL (CANCER CENTER ONLY)
Abs Immature Granulocytes: 0.01 10*3/uL (ref 0.00–0.07)
Basophils Absolute: 0 10*3/uL (ref 0.0–0.1)
Basophils Relative: 1 %
Eosinophils Absolute: 0.1 10*3/uL (ref 0.0–0.5)
Eosinophils Relative: 2 %
HCT: 45.1 % (ref 39.0–52.0)
Hemoglobin: 14.7 g/dL (ref 13.0–17.0)
Immature Granulocytes: 0 %
Lymphocytes Relative: 35 %
Lymphs Abs: 2.4 10*3/uL (ref 0.7–4.0)
MCH: 32 pg (ref 26.0–34.0)
MCHC: 32.6 g/dL (ref 30.0–36.0)
MCV: 98 fL (ref 80.0–100.0)
Monocytes Absolute: 0.7 10*3/uL (ref 0.1–1.0)
Monocytes Relative: 10 %
Neutro Abs: 3.6 10*3/uL (ref 1.7–7.7)
Neutrophils Relative %: 52 %
Platelet Count: 199 10*3/uL (ref 150–400)
RBC: 4.6 MIL/uL (ref 4.22–5.81)
RDW: 14 % (ref 11.5–15.5)
WBC Count: 6.8 10*3/uL (ref 4.0–10.5)
nRBC: 0 % (ref 0.0–0.2)

## 2022-09-11 LAB — PROTIME-INR
INR: 4.3 (ref 0.8–1.2)
Prothrombin Time: 40.7 seconds — ABNORMAL HIGH (ref 11.4–15.2)

## 2022-09-11 NOTE — Progress Notes (Unsigned)
Critical INR 4.3 given to Carola Rhine, NP at 1218 pm by Dorian Furnace (MT) 09/11/2022

## 2022-09-12 ENCOUNTER — Encounter: Payer: Self-pay | Admitting: Physical Therapy

## 2022-09-12 ENCOUNTER — Encounter: Payer: Self-pay | Admitting: Family

## 2022-09-12 ENCOUNTER — Ambulatory Visit: Payer: Medicare Other | Admitting: Physical Therapy

## 2022-09-12 ENCOUNTER — Telehealth: Payer: Self-pay | Admitting: *Deleted

## 2022-09-12 DIAGNOSIS — G8929 Other chronic pain: Secondary | ICD-10-CM | POA: Diagnosis not present

## 2022-09-12 DIAGNOSIS — M545 Low back pain, unspecified: Secondary | ICD-10-CM | POA: Diagnosis not present

## 2022-09-12 DIAGNOSIS — M25652 Stiffness of left hip, not elsewhere classified: Secondary | ICD-10-CM

## 2022-09-12 DIAGNOSIS — M542 Cervicalgia: Secondary | ICD-10-CM

## 2022-09-12 DIAGNOSIS — M6281 Muscle weakness (generalized): Secondary | ICD-10-CM | POA: Diagnosis not present

## 2022-09-12 DIAGNOSIS — R293 Abnormal posture: Secondary | ICD-10-CM | POA: Diagnosis not present

## 2022-09-12 NOTE — Therapy (Signed)
OUTPATIENT PHYSICAL THERAPY THORACOLUMBAR TREATMENT   Patient Name: Raymond Baldinger Sr. MRN: [id number] DOB:1951-11-07, 71 y.o., male Today's Date: 09/12/2022  .       PT End of Session - 09/12/22 0849     Visit Number 17 (P)     Date for PT Re-Evaluation 09/12/22 (P)     Authorization Type Medicare (P)     PT Start Time 0843 (P)     PT Stop Time 0927 (P)     PT Time Calculation (min) 44 min (P)     Activity Tolerance Patient tolerated treatment well (P)     Behavior During Therapy [facility] for tasks assessed/performed (P)                Past Medical History:  Diagnosis Date   DVT (deep venous thrombosis) ([facility])    GERD (gastroesophageal reflux disease)    Hyperlipidemia    Hypertension    Leg DVT (deep venous thromboembolism), chronic, left ([place]) 12/12/2021   Past Surgical History:  Procedure Laterality Date   EYE SURGERY     Patient Active Problem List   Diagnosis Date Noted   Pain of right lower extremity due to ischemia 05/01/2022   Leg DVT (deep venous thromboembolism), chronic, left ([place]) 12/12/2021   Mixed hyperlipidemia 11/21/2021   Low libido 11/21/2021   Low back pain 11/21/2021   Loss of appetite 11/21/2021   Hypertensive retinopathy 11/21/2021   Essential hypertension 11/21/2021   Chronic pain 11/21/2021   Enlarged prostate 06/20/2021   Degenerative disc disease, cervical 12/03/2020   Degenerative lumbar disc 09/27/2020   Degenerative arthritis of left knee 09/27/2020    PCP: Valere Dross, FNP  REFERRING PROVIDER: Creig Hines  REFERRING DIAG: Back pain and neck pain  Rationale for Evaluation and Treatment Rehabilitation  THERAPY DIAG:  Chronic bilateral low back pain without sciatica  Cervicalgia  Muscle weakness (generalized)  Stiffness of left hip, not elsewhere classified  ONSET DATE: 02/12/22  SUBJECTIVE:                                                                                                                                                                                            SUBJECTIVE STATEMENT: I am overall doing better, less pain, feel stronger  PERTINENT HISTORY:  Multiple DVT, GERD  PAIN:  Are you having pain? Yes: NPRS scale: 2/10 Pain location: L shoulder Pain description: sharp  Aggravating factors: lifting arm Relieving factors: not sure as it just started    PRECAUTIONS: None  WEIGHT BEARING RESTRICTIONS No  FALLS:  Has patient fallen in last 6 months? Yes. Number of falls 1  LIVING ENVIRONMENT: Lives with: lives with their family and lives alone Lives in: House/apartment Stairs: Yes: Internal: 12 steps; can reach both Has following equipment at home: None  OCCUPATION: retired  PLOF: Independent and plays tennis 2-3x/week, has not played recently due to the DVT  PATIENT GOALS have less pain, be stronger, continue tennis, be active, more flexible   OBJECTIVE:   DIAGNOSTIC FINDINGS:  HNP in the lumbar area  MUSCLE LENGTH: Hamstrings: Right 50 deg; Left 50 deg Very tight piriformis on the   9/18- moderate limitation in b hip flexors and quads, piriformis mild limitation, HS severe limitation B   POSTURE: rounded shoulders and forward head  PALPATION: Tight in the cervical area, the upper traps and the lumbar area  LUMBAR ROM:   all feel tight per patient,   Active  A/PROM  eval 9/18 08/17/22  Flexion Decreased 50% 30% limited  WFL  Extension 50% 50% limited  25% limited  Right lateral flexion 50% 30% limited  25% limited  Left lateral flexion 50% 30% limited  limited   30% limited  Right rotation     Left rotation      (Blank rows = not tested) Cervical ROM;  flexion WNL's, all other motions decreased 50% with c/o tightness; 9/18 still 50% limited in lateral flexion, L 25% limited/R 50% limited in cervical rotation, flexion/extension WNL   LOWER EXTREMITY ROM:   WFL's, shoulder WFL's  LOWER EXTREMITY MMT:  shoulders left is 4-/5 due to a RC injury in the left  shoulder, LE's 4+/5  9/18 Shoulder flexion: 4+/5 B, shoulder ABD 4+/5 B, ER R 5/5 L 4/5, IR R 5/5 L 4-/5, shoulder extension 4+/5  Ankle DF: 5/5 B, quads 5/5 B, hams 5/5 B, hip flexors 4+/5 B, hip ABD 5/5 B, hip extension 5/5 B   FUNCTIONAL TESTS:  Timed up and go (TUG): 15  GAIT: Reports fatigue with walking  miles fatigue  TODAY'S TREATMENT  09/12/22 Bike Level 4 x 6 minutes Nustep level 5 x 6 minutes Leg curls 25# 2x10 10# leg press 2x10 Seated row 25# 2x10 Lats 25# 2x10 Leg press 40# 2x10 Feet on ball exercises Red tband ER  09/05/22 Bike Level 5 x 6 minutes UBE level 4 x 6 minutes 40# resisted gait all directions 10# straight arm pulls Calf stretches Rows 25# 2x15 Lats 25# 2x15 Passive stretch of the LE's  08/30/22 NuStep L5 x 6 min UBE level 3 x 4 minutes Resisted gait all directions 50# Calf stretches Seated rows 25# 2x15 Shoulder Ext 10lb 2x10 S2S OHP yellow ball 2x10 Passive LE stretches  08/21/22 Nustep level 5 x 6 minutes Bike Level 4 x 6 minutes UBE level 3 x 4 minutes Calf stretches W backs Seated rows 25# 2x15 Lats 25# 2x15 Resisted gait all directions 40# Weighted ball overhead lift 10# straight arm pulls Leg press 50#  Passive LE stretches   08/17/22 NuStep L5 x 6 min  Hamstring curls 25lb 2x15  Leg Ext 10lb 2x15  Leg press 50lb 2x15 AR press 10lb x10 each  Rows 25# x10 x15 Lats 25lb 2x15 Shoulder flex 3lb x10 Shoulder abd 2lb x10 Shoulder Er yellow 2x10, limited ROM LUE  LE on Pball small bridges, oblq, K2C  Stretching Hamstring, Single K2C, Piriformis    08/14/22 NuStep L5 x 6 min  UBE L2 x 2 min each  Leg press 40lb 2x15 Rows 20# 2x15 Lats 20# 2x15 Back to wall red tband ER W backs  Shoulder  flex 2lb 2x10 Shoulder abd 2lb 2x10 Stretching Hamstring, Single K2C, Piriformis     PATIENT EDUCATION:  Education details: HS and piriformis stretch Person educated: Patient Education method: Consulting civil engineer, Demonstration,  and Verbal cues Education comprehension: verbalized understanding   HOME EXERCISE PROGRAM: As above  ASSESSMENT:  CLINICAL IMPRESSION:  All of todays exercises were with education on the machines the weights and form for his D/C to independent gym program, needed cues and some assist but overall he feels he can figure this out  OBJECTIVE IMPAIRMENTS Abnormal gait, cardiopulmonary status limiting activity, decreased activity tolerance, decreased balance, decreased endurance, decreased mobility, difficulty walking, decreased ROM, decreased strength, impaired perceived functional ability, increased muscle spasms, impaired flexibility, postural dysfunction, and pain.   REHAB POTENTIAL: Good  CLINICAL DECISION MAKING: Stable/uncomplicated  EVALUATION COMPLEXITY: Low   GOALS: Goals reviewed with patient? Yes  SHORT TERM GOALS: Target date: 06/27/22  Independent with initial HEP Goal status: met  LONG TERM GOALS: Target date: 09/04/22  Indepednetn with advanced HEP Goal status:met  2.  Understand posture and body mechanics   Goal status: met  3.  Decreae pain overall 50% Goal status:9/18- going well, MET   4.  Return to playing tennis without issue Goal status: met    PLAN: PT FREQUENCY: 1-2x/week  PT DURATION: 12 weeks  PLANNED INTERVENTIONS: Therapeutic exercises, Therapeutic activity, Neuromuscular re-education, Balance training, Gait training, Patient/Family education, Self Care, Joint mobilization, Dry Needling, Electrical stimulation, Spinal mobilization, Cryotherapy, Moist heat, Taping, Traction, and Manual therapy.  PLAN FOR NEXT SESSION:  D/C goals met  Lum Babe, PT 09/12/2022, 8:49 AM

## 2022-09-12 NOTE — Telephone Encounter (Signed)
Per scheduling message Judson Roch - Called and lvm of upcoming lab appointment, requested callback to confirm.

## 2022-09-18 ENCOUNTER — Telehealth: Payer: Self-pay | Admitting: *Deleted

## 2022-09-18 ENCOUNTER — Inpatient Hospital Stay: Payer: Medicare Other | Attending: Family

## 2022-09-18 DIAGNOSIS — I82502 Chronic embolism and thrombosis of unspecified deep veins of left lower extremity: Secondary | ICD-10-CM

## 2022-09-18 DIAGNOSIS — Z86718 Personal history of other venous thrombosis and embolism: Secondary | ICD-10-CM

## 2022-09-18 DIAGNOSIS — Z7901 Long term (current) use of anticoagulants: Secondary | ICD-10-CM | POA: Diagnosis not present

## 2022-09-18 DIAGNOSIS — I82412 Acute embolism and thrombosis of left femoral vein: Secondary | ICD-10-CM | POA: Insufficient documentation

## 2022-09-18 LAB — CBC WITH DIFFERENTIAL (CANCER CENTER ONLY)
Abs Immature Granulocytes: 0.02 10*3/uL (ref 0.00–0.07)
Basophils Absolute: 0 10*3/uL (ref 0.0–0.1)
Basophils Relative: 1 %
Eosinophils Absolute: 0.1 10*3/uL (ref 0.0–0.5)
Eosinophils Relative: 2 %
HCT: 45.1 % (ref 39.0–52.0)
Hemoglobin: 14.7 g/dL (ref 13.0–17.0)
Immature Granulocytes: 0 %
Lymphocytes Relative: 32 %
Lymphs Abs: 2.4 10*3/uL (ref 0.7–4.0)
MCH: 31.7 pg (ref 26.0–34.0)
MCHC: 32.6 g/dL (ref 30.0–36.0)
MCV: 97.2 fL (ref 80.0–100.0)
Monocytes Absolute: 0.6 10*3/uL (ref 0.1–1.0)
Monocytes Relative: 8 %
Neutro Abs: 4.2 10*3/uL (ref 1.7–7.7)
Neutrophils Relative %: 57 %
Platelet Count: 216 10*3/uL (ref 150–400)
RBC: 4.64 MIL/uL (ref 4.22–5.81)
RDW: 13.9 % (ref 11.5–15.5)
WBC Count: 7.4 10*3/uL (ref 4.0–10.5)
nRBC: 0 % (ref 0.0–0.2)

## 2022-09-18 LAB — PROTIME-INR
INR: 4.2 (ref 0.8–1.2)
Prothrombin Time: 40 seconds — ABNORMAL HIGH (ref 11.4–15.2)

## 2022-09-18 NOTE — Telephone Encounter (Signed)
Maggie from ED lab called with Critical PT/INR of 40.0 and 4.2.  Dr Marin Olp notified.  Continue with Coumadin dosing of '5mg'$ , 5 mg and 10 mg and recheck in 10 days.  Attempted to call patient.  LMAM to call back.

## 2022-09-18 NOTE — Telephone Encounter (Signed)
Reached patient and reviewed PT.INR results.  Dr Marin Olp wants patient to stay on 5 mg, 5 mg, 10 mg for 10 days.  Appts already in place for lab/Sarah.  Patient understands instructions

## 2022-09-26 ENCOUNTER — Other Ambulatory Visit: Payer: Self-pay | Admitting: Family

## 2022-09-26 DIAGNOSIS — Z86718 Personal history of other venous thrombosis and embolism: Secondary | ICD-10-CM

## 2022-09-26 DIAGNOSIS — I82502 Chronic embolism and thrombosis of unspecified deep veins of left lower extremity: Secondary | ICD-10-CM

## 2022-09-27 ENCOUNTER — Encounter: Payer: Self-pay | Admitting: Family

## 2022-09-27 ENCOUNTER — Inpatient Hospital Stay (HOSPITAL_BASED_OUTPATIENT_CLINIC_OR_DEPARTMENT_OTHER): Payer: Medicare Other | Admitting: Family

## 2022-09-27 ENCOUNTER — Inpatient Hospital Stay: Payer: Medicare Other

## 2022-09-27 VITALS — BP 126/74 | HR 76 | Resp 18

## 2022-09-27 DIAGNOSIS — I82502 Chronic embolism and thrombosis of unspecified deep veins of left lower extremity: Secondary | ICD-10-CM

## 2022-09-27 DIAGNOSIS — I82412 Acute embolism and thrombosis of left femoral vein: Secondary | ICD-10-CM | POA: Diagnosis not present

## 2022-09-27 DIAGNOSIS — I82402 Acute embolism and thrombosis of unspecified deep veins of left lower extremity: Secondary | ICD-10-CM | POA: Diagnosis not present

## 2022-09-27 DIAGNOSIS — Z86718 Personal history of other venous thrombosis and embolism: Secondary | ICD-10-CM

## 2022-09-27 DIAGNOSIS — Z7901 Long term (current) use of anticoagulants: Secondary | ICD-10-CM | POA: Diagnosis not present

## 2022-09-27 LAB — LACTATE DEHYDROGENASE: LDH: 198 U/L — ABNORMAL HIGH (ref 98–192)

## 2022-09-27 LAB — CBC WITH DIFFERENTIAL (CANCER CENTER ONLY)
Abs Immature Granulocytes: 0.02 10*3/uL (ref 0.00–0.07)
Basophils Absolute: 0.1 10*3/uL (ref 0.0–0.1)
Basophils Relative: 1 %
Eosinophils Absolute: 0.1 10*3/uL (ref 0.0–0.5)
Eosinophils Relative: 1 %
HCT: 46.1 % (ref 39.0–52.0)
Hemoglobin: 14.9 g/dL (ref 13.0–17.0)
Immature Granulocytes: 0 %
Lymphocytes Relative: 26 %
Lymphs Abs: 2.2 10*3/uL (ref 0.7–4.0)
MCH: 31.4 pg (ref 26.0–34.0)
MCHC: 32.3 g/dL (ref 30.0–36.0)
MCV: 97.3 fL (ref 80.0–100.0)
Monocytes Absolute: 0.6 10*3/uL (ref 0.1–1.0)
Monocytes Relative: 7 %
Neutro Abs: 5.6 10*3/uL (ref 1.7–7.7)
Neutrophils Relative %: 65 %
Platelet Count: 216 10*3/uL (ref 150–400)
RBC: 4.74 MIL/uL (ref 4.22–5.81)
RDW: 13.8 % (ref 11.5–15.5)
WBC Count: 8.6 10*3/uL (ref 4.0–10.5)
nRBC: 0 % (ref 0.0–0.2)

## 2022-09-27 LAB — CMP (CANCER CENTER ONLY)
ALT: 17 U/L (ref 0–44)
AST: 22 U/L (ref 15–41)
Albumin: 4.2 g/dL (ref 3.5–5.0)
Alkaline Phosphatase: 106 U/L (ref 38–126)
Anion gap: 7 (ref 5–15)
BUN: 18 mg/dL (ref 8–23)
CO2: 29 mmol/L (ref 22–32)
Calcium: 9.3 mg/dL (ref 8.9–10.3)
Chloride: 103 mmol/L (ref 98–111)
Creatinine: 1 mg/dL (ref 0.61–1.24)
GFR, Estimated: >60 mL/min (ref >60)
Glucose, Bld: 87 mg/dL (ref 70–99)
Potassium: 4.3 mmol/L (ref 3.5–5.1)
Sodium: 139 mmol/L (ref 135–145)
Total Bilirubin: 1.1 mg/dL (ref 0.3–1.2)
Total Protein: 7.3 g/dL (ref 6.5–8.1)

## 2022-09-27 LAB — PROTIME-INR
INR: 3.6 — ABNORMAL HIGH (ref 0.8–1.2)
Prothrombin Time: 35.9 seconds — ABNORMAL HIGH (ref 11.4–15.2)

## 2022-09-27 NOTE — Progress Notes (Signed)
Hematology and Oncology Follow Up Visit  Raymond Vroom Sr. [id number] Aug 16, 1951 71 y.o. 09/27/2022   Principle Diagnosis:  Thrombus of left femoral vein down to left popliteal vein --idiopathic   Current Therapy:        Pradaxa 150 mg po BID -- started on 05/16/2022 d/c'd Coumadin adjusted to maintain INR of 2.5-3.5   Interim History:  Mr. Raymond [name] is here today for follow-up. INR is stable at 3.6.  He is currently alternating 5 mg, 5 mg and 10 mg. He will continue his same regimen.  No blood loss noted. He has thin skin and does bruise easily on his arms and has to be careful to avoid skin tears. No petechiae.  No fever, chills, n/v, cough, rash, dizziness, SOB, chest pain, palpitations, abdominal pain or changes in bowel or bladder habits.  No tenderness, numbness or tingling in his extremities at this time.  Swelling in left calf improved. Pedal pulses are 1+.  No falls or syncope reported.  He has been playing tennis staying active.  Appetite and hydration are good. Weight is 180 lbs.   ECOG Performance Status: 1 - Symptomatic but completely ambulatory  Medications:  Allergies as of 09/27/2022       Reactions   Penicillins Hives, Rash   Severe Rash   Penicillin V Potassium Other (See Comments)        Medication List        Accurate as of September 27, 2022  1:23 PM. If you have any questions, ask your nurse or doctor.          atorvastatin 10 MG tablet Commonly known as: LIPITOR Take 1 tablet (10 mg total) by mouth daily.   cholecalciferol 25 MCG (1000 UNIT) tablet Commonly known as: VITAMIN D3 Take 1,000 Units by mouth daily.   fluoruracil 1 % cream Commonly known as: FLUOROPLEX 1 application to affected area   gabapentin 100 MG capsule Commonly known as: NEURONTIN Take 200 mg by mouth daily.   hydrochlorothiazide 12.5 MG capsule Commonly known as: MICROZIDE TAKE ONE CAPSULE BY MOUTH ONE TIME DAILY   losartan 50 MG tablet Commonly known  as: COZAAR TAKE ONE TABLET BY MOUTH ONE TIME DAILY   naproxen sodium 220 MG tablet Commonly known as: ALEVE Take 220 mg by mouth as needed. Takes before playing tennis.   warfarin 5 MG tablet Commonly known as: COUMADIN 07/03/22: Please take Coumadin '10mg'$  by mouth for two days in a row, on the 3rd day take '5mg'$ . Repeat this schedule until next lab check.        Allergies:  Allergies  Allergen Reactions   Penicillins Hives and Rash    Severe Rash    Penicillin V Potassium Other (See Comments)    Past Medical History, Surgical history, Social history, and Family History were reviewed and updated.  Review of Systems: All other 10 point review of systems is negative.   Physical Exam:  vitals were not taken for this visit.   Wt Readings from Last 3 Encounters:  08/16/22 174 lb (78.9 kg)  07/19/22 172 lb (78 kg)  06/29/22 172 lb 9.6 oz (78.3 kg)    Ocular: Sclerae unicteric, pupils equal, round and reactive to light Ear-nose-throat: Oropharynx clear, dentition fair Lymphatic: No cervical or supraclavicular adenopathy Lungs no rales or rhonchi, good excursion bilaterally Heart regular rate and rhythm, no murmur appreciated Abd soft, nontender, positive bowel sounds MSK no focal spinal tenderness, no joint edema Neuro: non-focal, well-oriented, appropriate affect Breasts:  Deferred   Lab Results  Component Value Date   WBC 7.4 09/18/2022   HGB 14.7 09/18/2022   HCT 45.1 09/18/2022   MCV 97.2 09/18/2022   PLT 216 09/18/2022   No results found for: "FERRITIN", "IRON", "TIBC", "UIBC", "IRONPCTSAT" Lab Results  Component Value Date   RBC 4.64 09/18/2022   No results found for: "KPAFRELGTCHN", "LAMBDASER", "KAPLAMBRATIO" No results found for: "IGGSERUM", "IGA", "IGMSERUM" No results found for: "TOTALPROTELP", "ALBUMINELP", "A1GS", "A2GS", "BETS", "BETA2SER", "GAMS", "MSPIKE", "SPEI"   Chemistry      Component Value Date/Time   NA 141 08/16/2022 1033   K 4.3  08/16/2022 1033   CL 105 08/16/2022 1033   CO2 30 08/16/2022 1033   BUN 16 08/16/2022 1033   CREATININE 1.04 08/16/2022 1033      Component Value Date/Time   CALCIUM 9.1 08/16/2022 1033   ALKPHOS 107 08/16/2022 1033   AST 22 08/16/2022 1033   ALT 15 08/16/2022 1033   BILITOT 1.1 08/16/2022 1033       Impression and Plan: Raymond [name] is a very pleasant 71 yo gentleman with left lower extremity DVT felt to be idiopathic.  He will continue his same regimen with Coumadin. INR 3.6.  We will recheck his INR every 3 weeks per MD.  Follow-up in 6 weeks.   Lottie Dawson, NP 11/15/20231:23 PM

## 2022-10-18 ENCOUNTER — Telehealth: Payer: Self-pay | Admitting: *Deleted

## 2022-10-18 ENCOUNTER — Inpatient Hospital Stay: Payer: Medicare Other | Attending: Family

## 2022-10-18 DIAGNOSIS — I82402 Acute embolism and thrombosis of unspecified deep veins of left lower extremity: Secondary | ICD-10-CM

## 2022-10-18 DIAGNOSIS — I82502 Chronic embolism and thrombosis of unspecified deep veins of left lower extremity: Secondary | ICD-10-CM

## 2022-10-18 DIAGNOSIS — I82412 Acute embolism and thrombosis of left femoral vein: Secondary | ICD-10-CM | POA: Diagnosis not present

## 2022-10-18 DIAGNOSIS — Z7901 Long term (current) use of anticoagulants: Secondary | ICD-10-CM | POA: Insufficient documentation

## 2022-10-18 DIAGNOSIS — Z86718 Personal history of other venous thrombosis and embolism: Secondary | ICD-10-CM

## 2022-10-18 LAB — CBC WITH DIFFERENTIAL (CANCER CENTER ONLY)
Abs Immature Granulocytes: 0.02 10*3/uL (ref 0.00–0.07)
Basophils Absolute: 0 10*3/uL (ref 0.0–0.1)
Basophils Relative: 0 %
Eosinophils Absolute: 0.1 10*3/uL (ref 0.0–0.5)
Eosinophils Relative: 1 %
HCT: 48.2 % (ref 39.0–52.0)
Hemoglobin: 15.7 g/dL (ref 13.0–17.0)
Immature Granulocytes: 0 %
Lymphocytes Relative: 30 %
Lymphs Abs: 2.4 10*3/uL (ref 0.7–4.0)
MCH: 32 pg (ref 26.0–34.0)
MCHC: 32.6 g/dL (ref 30.0–36.0)
MCV: 98.2 fL (ref 80.0–100.0)
Monocytes Absolute: 0.6 10*3/uL (ref 0.1–1.0)
Monocytes Relative: 8 %
Neutro Abs: 4.8 10*3/uL (ref 1.7–7.7)
Neutrophils Relative %: 61 %
Platelet Count: 204 10*3/uL (ref 150–400)
RBC: 4.91 MIL/uL (ref 4.22–5.81)
RDW: 13.5 % (ref 11.5–15.5)
WBC Count: 8 10*3/uL (ref 4.0–10.5)
nRBC: 0 % (ref 0.0–0.2)

## 2022-10-18 LAB — PROTIME-INR
INR: 3 — ABNORMAL HIGH (ref 0.8–1.2)
Prothrombin Time: 30.9 seconds — ABNORMAL HIGH (ref 11.4–15.2)

## 2022-10-18 NOTE — Telephone Encounter (Signed)
Called and lmovm for pt with NP instructions.Request for pt to call office to confirm message has been received.

## 2022-10-18 NOTE — Telephone Encounter (Signed)
-----   Message from Celso Amy, NP sent at 10/18/2022  2:56 PM EST ----- Continue same regimen with Coumadin.    ----- Message ----- From: Interface, Lab In [place] Sent: 10/18/2022  11:18 AM EST To: Celso Amy, NP

## 2022-11-03 DIAGNOSIS — Z23 Encounter for immunization: Secondary | ICD-10-CM | POA: Diagnosis not present

## 2022-11-08 ENCOUNTER — Inpatient Hospital Stay (HOSPITAL_BASED_OUTPATIENT_CLINIC_OR_DEPARTMENT_OTHER): Payer: Medicare Other | Admitting: Family

## 2022-11-08 ENCOUNTER — Inpatient Hospital Stay: Payer: Medicare Other

## 2022-11-08 ENCOUNTER — Other Ambulatory Visit: Payer: Self-pay

## 2022-11-08 ENCOUNTER — Encounter: Payer: Self-pay | Admitting: Family

## 2022-11-08 VITALS — BP 122/89 | HR 60 | Temp 98.1°F | Resp 19 | Ht 71.0 in | Wt 180.1 lb

## 2022-11-08 DIAGNOSIS — Z7901 Long term (current) use of anticoagulants: Secondary | ICD-10-CM | POA: Diagnosis not present

## 2022-11-08 DIAGNOSIS — Z86718 Personal history of other venous thrombosis and embolism: Secondary | ICD-10-CM | POA: Diagnosis not present

## 2022-11-08 DIAGNOSIS — I82502 Chronic embolism and thrombosis of unspecified deep veins of left lower extremity: Secondary | ICD-10-CM

## 2022-11-08 DIAGNOSIS — I82402 Acute embolism and thrombosis of unspecified deep veins of left lower extremity: Secondary | ICD-10-CM

## 2022-11-08 DIAGNOSIS — I82412 Acute embolism and thrombosis of left femoral vein: Secondary | ICD-10-CM | POA: Diagnosis not present

## 2022-11-08 LAB — CBC WITH DIFFERENTIAL (CANCER CENTER ONLY)
Abs Immature Granulocytes: 0.01 10*3/uL (ref 0.00–0.07)
Basophils Absolute: 0 10*3/uL (ref 0.0–0.1)
Basophils Relative: 1 %
Eosinophils Absolute: 0.2 10*3/uL (ref 0.0–0.5)
Eosinophils Relative: 2 %
HCT: 46.1 % (ref 39.0–52.0)
Hemoglobin: 15.1 g/dL (ref 13.0–17.0)
Immature Granulocytes: 0 %
Lymphocytes Relative: 38 %
Lymphs Abs: 2.9 10*3/uL (ref 0.7–4.0)
MCH: 32.1 pg (ref 26.0–34.0)
MCHC: 32.8 g/dL (ref 30.0–36.0)
MCV: 97.9 fL (ref 80.0–100.0)
Monocytes Absolute: 0.6 10*3/uL (ref 0.1–1.0)
Monocytes Relative: 8 %
Neutro Abs: 4 10*3/uL (ref 1.7–7.7)
Neutrophils Relative %: 51 %
Platelet Count: 191 10*3/uL (ref 150–400)
RBC: 4.71 MIL/uL (ref 4.22–5.81)
RDW: 13.4 % (ref 11.5–15.5)
WBC Count: 7.6 10*3/uL (ref 4.0–10.5)
nRBC: 0 % (ref 0.0–0.2)

## 2022-11-08 LAB — CMP (CANCER CENTER ONLY)
ALT: 19 U/L (ref 0–44)
AST: 23 U/L (ref 15–41)
Albumin: 4.3 g/dL (ref 3.5–5.0)
Alkaline Phosphatase: 91 U/L (ref 38–126)
Anion gap: 7 (ref 5–15)
BUN: 16 mg/dL (ref 8–23)
CO2: 28 mmol/L (ref 22–32)
Calcium: 9 mg/dL (ref 8.9–10.3)
Chloride: 104 mmol/L (ref 98–111)
Creatinine: 0.88 mg/dL (ref 0.61–1.24)
GFR, Estimated: >60 mL/min (ref >60)
Glucose, Bld: 91 mg/dL (ref 70–99)
Potassium: 4.6 mmol/L (ref 3.5–5.1)
Sodium: 139 mmol/L (ref 135–145)
Total Bilirubin: 0.8 mg/dL (ref 0.3–1.2)
Total Protein: 7.1 g/dL (ref 6.5–8.1)

## 2022-11-08 LAB — PROTIME-INR
INR: 3.1 — ABNORMAL HIGH (ref 0.8–1.2)
Prothrombin Time: 31.4 seconds — ABNORMAL HIGH (ref 11.4–15.2)

## 2022-11-08 LAB — LACTATE DEHYDROGENASE: LDH: 182 U/L (ref 98–192)

## 2022-11-08 NOTE — Progress Notes (Signed)
Hematology and Oncology Follow Up Visit  Raymond Vandyken Sr. [id number] November 03, 1951 71 y.o. 11/08/2022   Principle Diagnosis:  Thrombus of left femoral vein down to left popliteal vein --idiopathic   Current Therapy:        Pradaxa 150 mg po BID -- started on 05/16/2022 d/c'd Coumadin adjusted to maintain INR of 2.5-3.5   Interim History:  Raymond [name] is here today for follow-up. He is doing fairly well. The holidays are hard after losing his precious wife.  He has not noted any blood loss. No abnormal bruising or petechiae.  No fever, chills, n/v, cough, rash, dizziness, SOB, chest pain, palpitations, abdominal pain or changes in bowel or bladder habits.  No swelling in his extremities.  He has chronic lower back and joint pain in the left knee.  No falls or syncope reported.  Appetite and hydration are good. He has a friend across the street that is teaching him how to cook. His weight is stable at 180 lbs.   ECOG Performance Status: 1 - Symptomatic but completely ambulatory  Medications:  Allergies as of 11/08/2022       Reactions   Penicillins Hives, Rash   Severe Rash   Penicillin V Potassium Other (See Comments)        Medication List        Accurate as of November 08, 2022 12:58 PM. If you have any questions, ask your nurse or doctor.          atorvastatin 10 MG tablet Commonly known as: LIPITOR Take 1 tablet (10 mg total) by mouth daily.   cholecalciferol 25 MCG (1000 UNIT) tablet Commonly known as: VITAMIN D3 Take 1,000 Units by mouth daily.   fluoruracil 1 % cream Commonly known as: FLUOROPLEX 1 application to affected area   gabapentin 100 MG capsule Commonly known as: NEURONTIN Take 200 mg by mouth daily.   hydrochlorothiazide 12.5 MG capsule Commonly known as: MICROZIDE TAKE ONE CAPSULE BY MOUTH ONE TIME DAILY   losartan 50 MG tablet Commonly known as: COZAAR TAKE ONE TABLET BY MOUTH ONE TIME DAILY   naproxen sodium 220 MG  tablet Commonly known as: ALEVE Take 220 mg by mouth as needed. Takes before playing tennis.   warfarin 5 MG tablet Commonly known as: COUMADIN 07/03/22: Please take Coumadin '10mg'$  by mouth for two days in a row, on the 3rd day take '5mg'$ . Repeat this schedule until next lab check.        Allergies:  Allergies  Allergen Reactions   Penicillins Hives and Rash    Severe Rash    Penicillin V Potassium Other (See Comments)    Past Medical History, Surgical history, Social history, and Family History were reviewed and updated.  Review of Systems: All other 10 point review of systems is negative.   Physical Exam:  vitals were not taken for this visit.   Wt Readings from Last 3 Encounters:  08/16/22 174 lb (78.9 kg)  07/19/22 172 lb (78 kg)  06/29/22 172 lb 9.6 oz (78.3 kg)    Ocular: Sclerae unicteric, pupils equal, round and reactive to light Ear-nose-throat: Oropharynx clear, dentition fair Lymphatic: No cervical or supraclavicular adenopathy Lungs no rales or rhonchi, good excursion bilaterally Heart regular rate and rhythm, no murmur appreciated Abd soft, nontender, positive bowel sounds MSK no focal spinal tenderness, no joint edema Neuro: non-focal, well-oriented, appropriate affect Breasts: Deferred   Lab Results  Component Value Date   WBC 8.0 10/18/2022   HGB 15.7  10/18/2022   HCT 48.2 10/18/2022   MCV 98.2 10/18/2022   PLT 204 10/18/2022   No results found for: "FERRITIN", "IRON", "TIBC", "UIBC", "IRONPCTSAT" Lab Results  Component Value Date   RBC 4.91 10/18/2022   No results found for: "KPAFRELGTCHN", "LAMBDASER", "KAPLAMBRATIO" No results found for: "IGGSERUM", "IGA", "IGMSERUM" No results found for: "TOTALPROTELP", "ALBUMINELP", "A1GS", "A2GS", "BETS", "BETA2SER", "GAMS", "MSPIKE", "SPEI"   Chemistry      Component Value Date/Time   NA 139 09/27/2022 1314   K 4.3 09/27/2022 1314   CL 103 09/27/2022 1314   CO2 29 09/27/2022 1314   BUN 18  09/27/2022 1314   CREATININE 1.00 09/27/2022 1314      Component Value Date/Time   CALCIUM 9.3 09/27/2022 1314   ALKPHOS 106 09/27/2022 1314   AST 22 09/27/2022 1314   ALT 17 09/27/2022 1314   BILITOT 1.1 09/27/2022 1314       Impression and Plan: Raymond [name] is a very pleasant 71 yo gentleman with left lower extremity DVT felt to be idiopathic.  He will continue his same regimen with Coumadin alternating 5, 5 and 10 mg. INR 3.1.  We will recheck his INR every 3 weeks per MD.  Follow-up in 3 weeks.   Lottie Dawson, NP 12/27/202312:58 PM

## 2022-11-16 NOTE — Progress Notes (Signed)
[place] Phone: [phone] Subjective:   [name], am serving as a scribe for Dr. Hulan Saas. I'm seeing this patient by the request  of:  Marrian Salvage, FNP  CC: Back pain, knee pain  [id number]  07/19/2022 Discussed with patient again for good amount of time.  Discussed the possibility of gabapentin.  We also discussed the nerve conduction study.  Patient wants to hold on the test.  Patient does not want to do the nerve conduction test.  We are trying to decide how much of this would be neurologic compared to the vascular patient is having trouble.  He is making improvement at this point.  Follow-up with me again in 3 months otherwise.  Worsening pain will get the nerve conduction study.     Update 11/22/2022 [name] is a 72 y.o. male coming in with complaint of lumbar spine pain. Patient states that he is playing less tennis due to blood clot. Pain is manageable since he is not playing.   L knee pain and stiffness after sitting of lying for prolonged periods. Pain in anterior aspect. Uses Aleve prn when he is playing. Does not want to do anything with the knee at this time.   Was doing PT but Medicare felt that patient met maximum benefit. Patient feels that in the near future he will let us know about doing more PT.   Would like a refill on gabapentin. Had not filled it for 1 year as he is not taking it daily.      Past Medical History:  Diagnosis Date   DVT (deep venous thrombosis) (HCC)    GERD (gastroesophageal reflux disease)    Hyperlipidemia    Hypertension    Leg DVT (deep venous thromboembolism), chronic, left ([place]) 12/12/2021   Past Surgical History:  Procedure Laterality Date   EYE SURGERY     Social History   Socioeconomic History   Marital status: Widowed    Spouse name: Not on file   Number of children: Not on file   Years of education: Not on  file   Highest education level: Not on file  Occupational History   Not on file  Tobacco Use   Smoking status: Never   Smokeless tobacco: Not on file  Vaping Use   Vaping Use: Never used  Substance and Sexual Activity   Alcohol use: Not Currently   Drug use: Not Currently   Sexual activity: Not Currently  Other Topics Concern   Not on file  Social History Narrative   Not on file   Social Determinants of Health   Financial Resource Strain: Medium Risk (06/01/2022)   Overall Financial Resource Strain (CARDIA)    Difficulty of Paying Living Expenses: Somewhat hard  Food Insecurity: No Food Insecurity (06/01/2022)   Hunger Vital Sign    Worried About Running Out of Food in the Last Year: Never true    Ran Out of Food in the Last Year: Never true  Transportation Needs: No Transportation Needs (06/01/2022)   PRAPARE - Hydrologist (Medical): No    Lack of Transportation (Non-Medical): No  Physical Activity: Sufficiently Active (06/01/2022)   Exercise Vital Sign    Days of Exercise per Week: 7 days    Minutes of Exercise per Session: 60 min  Stress: Stress Concern Present (06/01/2022)   [place]  Feeling of Stress : Rather much  Social Connections: Moderately Isolated (06/01/2022)   Social Connection and Isolation Panel [NHANES]    Frequency of Communication with Friends and Family: More than three times a week    Frequency of Social Gatherings with Friends and Family: More than three times a week    Attends Religious Services: Never    Marine scientist or Organizations: Yes    Attends Music therapist: More than 4 times per year    Marital Status: Widowed   Allergies  Allergen Reactions   Penicillins Hives and Rash        Penicillin V Potassium Other (See Comments)   Family History  Problem Relation Age of Onset   Alcohol abuse Mother    Arthritis Mother     Diabetes Mother    Mental retardation Mother    Alcohol abuse Father    Hypertension Father    Cancer Sister    Depression Sister    Cancer Sister    Arthritis Maternal Grandmother    Parkinson's disease Maternal Grandfather      Current Outpatient Medications (Cardiovascular):    atorvastatin (LIPITOR) 10 MG tablet, Take 1 tablet (10 mg total) by mouth daily.   hydrochlorothiazide (MICROZIDE) 12.5 MG capsule, TAKE ONE CAPSULE BY MOUTH ONE TIME DAILY   losartan (COZAAR) 50 MG tablet, TAKE ONE TABLET BY MOUTH ONE TIME DAILY   Current Outpatient Medications (Analgesics):    naproxen sodium (ALEVE) 220 MG tablet, Take 220 mg by mouth as needed. Takes before playing tennis.  Current Outpatient Medications (Hematological):    warfarin (COUMADIN) 5 MG tablet, 07/03/22: Please take Coumadin '10mg'$  by mouth for two days in a row, on the 3rd day take '5mg'$ . Repeat this schedule until next lab check.  Current Outpatient Medications (Other):    cholecalciferol (VITAMIN D3) 25 MCG (1000 UNIT) tablet, Take 1,000 Units by mouth daily.   fluoruracil (FLUOROPLEX) 1 % cream, 1 application to affected area   gabapentin (NEURONTIN) 100 MG capsule, Take 2 capsules (200 mg total) by mouth at bedtime.   Reviewed prior external information including notes and imaging from  primary care provider As well as notes that were available from care everywhere and other healthcare systems.  Past medical history, social, surgical and family history all reviewed in electronic medical record.  No pertanent information unless stated regarding to the chief complaint.   Review of Systems:  No headache, visual changes, nausea, vomiting, diarrhea, constipation, dizziness, abdominal pain, skin rash, fevers, chills, night sweats, weight loss, swollen lymph nodes, body aches, joint swelling, chest pain, shortness of breath, mood changes. POSITIVE muscle aches  Objective  Blood pressure 122/72, pulse 74, height '5\' 11"'$   (1.803 m), weight 179 lb (81.2 kg), SpO2 92 %.   General: No apparent distress alert and oriented x3 mood and affect normal, dressed appropriately.  HEENT: Pupils equal, extraocular movements intact  Respiratory: Patient's speak in full sentences and does not appear short of breath  Cardiovascular: No lower extremity edema, non tender, no erythema  Patient's knee exam does have some crepitus noted.  Some tenderness noticed over the medial compartment.  Crepitus noted.  Instability with valgus and varus force.  Full flexion and extension are noted. Low back exam does have some loss of lordosis.  Patient does have some difficulty going from a seated to standing position.  Some mild atrophy of the gluteal musculature bilaterally.   Impression and Recommendations:    The above documentation  has been reviewed and is accurate and complete Lyndal Pulley, DO

## 2022-11-22 ENCOUNTER — Ambulatory Visit (INDEPENDENT_AMBULATORY_CARE_PROVIDER_SITE_OTHER): Payer: Medicare Other | Admitting: Family Medicine

## 2022-11-22 VITALS — BP 122/72 | HR 74 | Ht 71.0 in | Wt 179.0 lb

## 2022-11-22 DIAGNOSIS — M1712 Unilateral primary osteoarthritis, left knee: Secondary | ICD-10-CM

## 2022-11-22 DIAGNOSIS — M5136 Other intervertebral disc degeneration, lumbar region: Secondary | ICD-10-CM

## 2022-11-22 DIAGNOSIS — I82502 Chronic embolism and thrombosis of unspecified deep veins of left lower extremity: Secondary | ICD-10-CM | POA: Diagnosis not present

## 2022-11-22 MED ORDER — GABAPENTIN 100 MG PO CAPS: 200.0000 mg | ORAL_CAPSULE | Freq: Every day | ORAL | 0 refills | Status: DC

## 2022-11-22 NOTE — Assessment & Plan Note (Signed)
Could be causing some of the leg pain.  Has been followed by vascular as well as oncology.  Is on Coumadin at this time.

## 2022-11-22 NOTE — Assessment & Plan Note (Signed)
Has responded to.  Therapy previously, having more radicular symptoms.  Will have patient try gabapentin again which did make some improvement previously.  Patient knows of side effects.  Will try 200 mg at night.  Continue the home exercises.  Follow-up with me again in 2 to 3 months.  Total time with patient 31 minutes

## 2022-11-22 NOTE — Patient Instructions (Signed)
Good to see you doing better Gabapentin '200mg'$  at night See me mid-March

## 2022-11-22 NOTE — Assessment & Plan Note (Signed)
Degenerative arthritis of the knee noted.  Discussed icing regimen and home exercises, discussed which activities to do and which ones to avoid.  Increase activity slowly.  Increase activity slowly.  Discussed if any more instability will need to consider bracing.  Hold on any injection at the moment.  Follow-up again in 2 to 3 months

## 2022-11-27 ENCOUNTER — Encounter: Payer: Self-pay | Admitting: Family

## 2022-11-27 ENCOUNTER — Other Ambulatory Visit: Payer: Self-pay | Admitting: *Deleted

## 2022-11-27 DIAGNOSIS — I82502 Chronic embolism and thrombosis of unspecified deep veins of left lower extremity: Secondary | ICD-10-CM

## 2022-11-27 MED ORDER — WARFARIN SODIUM 5 MG PO TABS: ORAL_TABLET | ORAL | 2 refills | Status: DC

## 2022-11-27 NOTE — Telephone Encounter (Signed)
Refill on rx for Coumadin.  VO per Lottie Dawson, NP Pt to continue same current dose. Coumadin '10mg'$  by mouth for two days in a row, on the 3rd day take '5mg'$ . Repeat this schedule until next lab check., .New Rx sent to pt pharmacy.

## 2022-11-28 ENCOUNTER — Other Ambulatory Visit: Payer: Self-pay | Admitting: Family

## 2022-11-28 DIAGNOSIS — H02055 Trichiasis without entropian left lower eyelid: Secondary | ICD-10-CM | POA: Diagnosis not present

## 2022-11-28 DIAGNOSIS — D3132 Benign neoplasm of left choroid: Secondary | ICD-10-CM | POA: Diagnosis not present

## 2022-11-28 DIAGNOSIS — H04123 Dry eye syndrome of bilateral lacrimal glands: Secondary | ICD-10-CM | POA: Diagnosis not present

## 2022-11-28 DIAGNOSIS — H539 Unspecified visual disturbance: Secondary | ICD-10-CM | POA: Diagnosis not present

## 2022-11-30 ENCOUNTER — Inpatient Hospital Stay: Payer: Medicare Other | Attending: Family

## 2022-11-30 ENCOUNTER — Other Ambulatory Visit: Payer: Self-pay | Admitting: Family

## 2022-11-30 DIAGNOSIS — I82412 Acute embolism and thrombosis of left femoral vein: Secondary | ICD-10-CM | POA: Diagnosis not present

## 2022-11-30 DIAGNOSIS — I82402 Acute embolism and thrombosis of unspecified deep veins of left lower extremity: Secondary | ICD-10-CM

## 2022-11-30 DIAGNOSIS — Z7901 Long term (current) use of anticoagulants: Secondary | ICD-10-CM | POA: Insufficient documentation

## 2022-11-30 DIAGNOSIS — I82502 Chronic embolism and thrombosis of unspecified deep veins of left lower extremity: Secondary | ICD-10-CM

## 2022-11-30 DIAGNOSIS — Z86718 Personal history of other venous thrombosis and embolism: Secondary | ICD-10-CM

## 2022-11-30 LAB — PROTIME-INR
INR: 2.5 — ABNORMAL HIGH (ref 0.8–1.2)
Prothrombin Time: 26.9 seconds — ABNORMAL HIGH (ref 11.4–15.2)

## 2022-12-01 ENCOUNTER — Encounter: Payer: Self-pay | Admitting: *Deleted

## 2022-12-21 ENCOUNTER — Inpatient Hospital Stay: Payer: Medicare Other | Attending: Family

## 2022-12-21 DIAGNOSIS — I82412 Acute embolism and thrombosis of left femoral vein: Secondary | ICD-10-CM | POA: Insufficient documentation

## 2022-12-21 DIAGNOSIS — I82502 Chronic embolism and thrombosis of unspecified deep veins of left lower extremity: Secondary | ICD-10-CM

## 2022-12-21 DIAGNOSIS — Z86718 Personal history of other venous thrombosis and embolism: Secondary | ICD-10-CM

## 2022-12-21 DIAGNOSIS — I82402 Acute embolism and thrombosis of unspecified deep veins of left lower extremity: Secondary | ICD-10-CM

## 2022-12-21 LAB — PROTIME-INR
INR: 3.1 — ABNORMAL HIGH (ref 0.8–1.2)
Prothrombin Time: 31.3 seconds — ABNORMAL HIGH (ref 11.4–15.2)

## 2023-01-07 ENCOUNTER — Other Ambulatory Visit: Payer: Self-pay | Admitting: Family

## 2023-01-11 ENCOUNTER — Telehealth: Payer: Self-pay | Admitting: *Deleted

## 2023-01-11 ENCOUNTER — Inpatient Hospital Stay: Payer: Medicare Other

## 2023-01-11 DIAGNOSIS — Z86718 Personal history of other venous thrombosis and embolism: Secondary | ICD-10-CM

## 2023-01-11 DIAGNOSIS — I82402 Acute embolism and thrombosis of unspecified deep veins of left lower extremity: Secondary | ICD-10-CM

## 2023-01-11 DIAGNOSIS — I82412 Acute embolism and thrombosis of left femoral vein: Secondary | ICD-10-CM | POA: Diagnosis not present

## 2023-01-11 DIAGNOSIS — I82502 Chronic embolism and thrombosis of unspecified deep veins of left lower extremity: Secondary | ICD-10-CM

## 2023-01-11 LAB — PROTIME-INR
INR: 2.2 — ABNORMAL HIGH (ref 0.8–1.2)
Prothrombin Time: 23.8 seconds — ABNORMAL HIGH (ref 11.4–15.2)

## 2023-01-11 NOTE — Telephone Encounter (Signed)
Heather from lab brought to my attention a corrected PT/INR. The corrected value is 23.8/2.2. Results given to MD.

## 2023-01-22 ENCOUNTER — Encounter: Payer: Self-pay | Admitting: Family

## 2023-01-24 ENCOUNTER — Encounter: Payer: Self-pay | Admitting: Family

## 2023-01-24 ENCOUNTER — Ambulatory Visit: Payer: Medicare Other | Admitting: Family Medicine

## 2023-01-29 ENCOUNTER — Ambulatory Visit
Admission: EM | Admit: 2023-01-29 | Discharge: 2023-01-29 | Disposition: A | Discharge status: Home / Self Care | Payer: Medicare Other | Attending: Family | Admitting: Family

## 2023-01-29 ENCOUNTER — Emergency Department (HOSPITAL_COMMUNITY): Payer: Medicare Other

## 2023-01-29 ENCOUNTER — Encounter (HOSPITAL_COMMUNITY): Payer: Self-pay

## 2023-01-29 ENCOUNTER — Other Ambulatory Visit: Payer: Self-pay

## 2023-01-29 ENCOUNTER — Inpatient Hospital Stay (HOSPITAL_COMMUNITY): Payer: Medicare Other

## 2023-01-29 ENCOUNTER — Inpatient Hospital Stay (HOSPITAL_COMMUNITY)
Admission: EM | Admit: 2023-01-29 | Discharge: 2023-02-01 | DRG: 199 | Disposition: A | Discharge status: Home / Self Care | Payer: Medicare Other | Source: Ambulatory Visit | Attending: General Surgery | Admitting: General Surgery

## 2023-01-29 VITALS — BP 121/80 | HR 95 | Temp 98.2°F | Resp 18

## 2023-01-29 DIAGNOSIS — Z8249 Family history of ischemic heart disease and other diseases of the circulatory system: Secondary | ICD-10-CM | POA: Diagnosis not present

## 2023-01-29 DIAGNOSIS — Z7901 Long term (current) use of anticoagulants: Secondary | ICD-10-CM

## 2023-01-29 DIAGNOSIS — R0902 Hypoxemia: Secondary | ICD-10-CM | POA: Diagnosis not present

## 2023-01-29 DIAGNOSIS — S2231XA Fracture of one rib, right side, initial encounter for closed fracture: Secondary | ICD-10-CM | POA: Diagnosis not present

## 2023-01-29 DIAGNOSIS — S270XXA Traumatic pneumothorax, initial encounter: Principal | ICD-10-CM | POA: Diagnosis present

## 2023-01-29 DIAGNOSIS — Z66 Do not resuscitate: Secondary | ICD-10-CM | POA: Diagnosis present

## 2023-01-29 DIAGNOSIS — Z9229 Personal history of other drug therapy: Secondary | ICD-10-CM | POA: Diagnosis not present

## 2023-01-29 DIAGNOSIS — Z4682 Encounter for fitting and adjustment of non-vascular catheter: Secondary | ICD-10-CM | POA: Diagnosis not present

## 2023-01-29 DIAGNOSIS — I10 Essential (primary) hypertension: Secondary | ICD-10-CM | POA: Diagnosis not present

## 2023-01-29 DIAGNOSIS — Z79899 Other long term (current) drug therapy: Secondary | ICD-10-CM | POA: Diagnosis not present

## 2023-01-29 DIAGNOSIS — R0602 Shortness of breath: Secondary | ICD-10-CM

## 2023-01-29 DIAGNOSIS — W109XXA Fall (on) (from) unspecified stairs and steps, initial encounter: Secondary | ICD-10-CM | POA: Diagnosis present

## 2023-01-29 DIAGNOSIS — Z23 Encounter for immunization: Secondary | ICD-10-CM

## 2023-01-29 DIAGNOSIS — I82512 Chronic embolism and thrombosis of left femoral vein: Secondary | ICD-10-CM | POA: Diagnosis present

## 2023-01-29 DIAGNOSIS — F1090 Alcohol use, unspecified, uncomplicated: Secondary | ICD-10-CM | POA: Diagnosis present

## 2023-01-29 DIAGNOSIS — I7300 Raynaud's syndrome without gangrene: Secondary | ICD-10-CM | POA: Diagnosis present

## 2023-01-29 DIAGNOSIS — E785 Hyperlipidemia, unspecified: Secondary | ICD-10-CM | POA: Diagnosis present

## 2023-01-29 DIAGNOSIS — J9811 Atelectasis: Secondary | ICD-10-CM | POA: Diagnosis not present

## 2023-01-29 DIAGNOSIS — K219 Gastro-esophageal reflux disease without esophagitis: Secondary | ICD-10-CM | POA: Diagnosis present

## 2023-01-29 DIAGNOSIS — M5136 Other intervertebral disc degeneration, lumbar region: Secondary | ICD-10-CM | POA: Diagnosis present

## 2023-01-29 DIAGNOSIS — S2241XA Multiple fractures of ribs, right side, initial encounter for closed fracture: Secondary | ICD-10-CM | POA: Diagnosis present

## 2023-01-29 DIAGNOSIS — Z833 Family history of diabetes mellitus: Secondary | ICD-10-CM | POA: Diagnosis not present

## 2023-01-29 DIAGNOSIS — Z820 Family history of epilepsy and other diseases of the nervous system: Secondary | ICD-10-CM

## 2023-01-29 DIAGNOSIS — Z8261 Family history of arthritis: Secondary | ICD-10-CM | POA: Diagnosis not present

## 2023-01-29 DIAGNOSIS — Q283 Other malformations of cerebral vessels: Secondary | ICD-10-CM | POA: Diagnosis not present

## 2023-01-29 DIAGNOSIS — S60221A Contusion of right hand, initial encounter: Secondary | ICD-10-CM | POA: Diagnosis present

## 2023-01-29 DIAGNOSIS — Z888 Allergy status to other drugs, medicaments and biological substances status: Secondary | ICD-10-CM | POA: Diagnosis not present

## 2023-01-29 DIAGNOSIS — Z880 Allergy status to penicillin: Secondary | ICD-10-CM

## 2023-01-29 DIAGNOSIS — G9389 Other specified disorders of brain: Secondary | ICD-10-CM | POA: Diagnosis not present

## 2023-01-29 DIAGNOSIS — W19XXXA Unspecified fall, initial encounter: Principal | ICD-10-CM

## 2023-01-29 DIAGNOSIS — K5730 Diverticulosis of large intestine without perforation or abscess without bleeding: Secondary | ICD-10-CM | POA: Diagnosis not present

## 2023-01-29 DIAGNOSIS — J90 Pleural effusion, not elsewhere classified: Secondary | ICD-10-CM | POA: Diagnosis not present

## 2023-01-29 DIAGNOSIS — R069 Unspecified abnormalities of breathing: Secondary | ICD-10-CM | POA: Diagnosis not present

## 2023-01-29 DIAGNOSIS — D689 Coagulation defect, unspecified: Secondary | ICD-10-CM

## 2023-01-29 DIAGNOSIS — S0990XA Unspecified injury of head, initial encounter: Secondary | ICD-10-CM | POA: Diagnosis not present

## 2023-01-29 DIAGNOSIS — S60511A Abrasion of right hand, initial encounter: Secondary | ICD-10-CM | POA: Diagnosis present

## 2023-01-29 DIAGNOSIS — Z818 Family history of other mental and behavioral disorders: Secondary | ICD-10-CM

## 2023-01-29 DIAGNOSIS — I82402 Acute embolism and thrombosis of unspecified deep veins of left lower extremity: Secondary | ICD-10-CM | POA: Diagnosis not present

## 2023-01-29 DIAGNOSIS — Z810 Family history of intellectual disabilities: Secondary | ICD-10-CM | POA: Diagnosis not present

## 2023-01-29 DIAGNOSIS — K7689 Other specified diseases of liver: Secondary | ICD-10-CM | POA: Diagnosis not present

## 2023-01-29 DIAGNOSIS — T07XXXA Unspecified multiple injuries, initial encounter: Secondary | ICD-10-CM

## 2023-01-29 DIAGNOSIS — J939 Pneumothorax, unspecified: Secondary | ICD-10-CM | POA: Diagnosis not present

## 2023-01-29 HISTORY — DX: Other intervertebral disc degeneration, lumbar region without mention of lumbar back pain or lower extremity pain: M51.369

## 2023-01-29 HISTORY — DX: Raynaud's syndrome without gangrene: I73.00

## 2023-01-29 HISTORY — DX: Other intervertebral disc degeneration, lumbar region: M51.36

## 2023-01-29 LAB — CBC WITH DIFFERENTIAL/PLATELET
Abs Immature Granulocytes: 0.03 10*3/uL (ref 0.00–0.07)
Basophils Absolute: 0 10*3/uL (ref 0.0–0.1)
Basophils Relative: 1 %
Eosinophils Absolute: 0.5 10*3/uL (ref 0.0–0.5)
Eosinophils Relative: 5 %
HCT: 46.2 % (ref 39.0–52.0)
Hemoglobin: 15.6 g/dL (ref 13.0–17.0)
Immature Granulocytes: 0 %
Lymphocytes Relative: 27 %
Lymphs Abs: 2.3 10*3/uL (ref 0.7–4.0)
MCH: 33.1 pg (ref 26.0–34.0)
MCHC: 33.8 g/dL (ref 30.0–36.0)
MCV: 98.1 fL (ref 80.0–100.0)
Monocytes Absolute: 1 10*3/uL (ref 0.1–1.0)
Monocytes Relative: 11 %
Neutro Abs: 4.8 10*3/uL (ref 1.7–7.7)
Neutrophils Relative %: 56 %
Platelets: 285 10*3/uL (ref 150–400)
RBC: 4.71 MIL/uL (ref 4.22–5.81)
RDW: 12.9 % (ref 11.5–15.5)
WBC: 8.6 10*3/uL (ref 4.0–10.5)
nRBC: 0 % (ref 0.0–0.2)

## 2023-01-29 LAB — COMPREHENSIVE METABOLIC PANEL
ALT: 18 U/L (ref 0–44)
AST: 34 U/L (ref 15–41)
Albumin: 3.8 g/dL (ref 3.5–5.0)
Alkaline Phosphatase: 97 U/L (ref 38–126)
Anion gap: 10 (ref 5–15)
BUN: 17 mg/dL (ref 8–23)
CO2: 25 mmol/L (ref 22–32)
Calcium: 8.7 mg/dL — ABNORMAL LOW (ref 8.9–10.3)
Chloride: 100 mmol/L (ref 98–111)
Creatinine, Ser: 0.86 mg/dL (ref 0.61–1.24)
GFR, Estimated: >60 mL/min (ref >60)
Glucose, Bld: 92 mg/dL (ref 70–99)
Potassium: 4.5 mmol/L (ref 3.5–5.1)
Sodium: 135 mmol/L (ref 135–145)
Total Bilirubin: 1.9 mg/dL — ABNORMAL HIGH (ref 0.3–1.2)
Total Protein: 7.1 g/dL (ref 6.5–8.1)

## 2023-01-29 LAB — I-STAT CHEM 8, ED
BUN: 16 mg/dL (ref 8–23)
Calcium, Ion: 1.06 mmol/L — ABNORMAL LOW (ref 1.15–1.40)
Chloride: 102 mmol/L (ref 98–111)
Creatinine, Ser: 0.7 mg/dL (ref 0.61–1.24)
Glucose, Bld: 90 mg/dL (ref 70–99)
HCT: 43 % (ref 39.0–52.0)
Hemoglobin: 14.6 g/dL (ref 13.0–17.0)
Potassium: 3.5 mmol/L (ref 3.5–5.1)
Sodium: 138 mmol/L (ref 135–145)
TCO2: 24 mmol/L (ref 22–32)

## 2023-01-29 LAB — PROTIME-INR
INR: 3.8 — ABNORMAL HIGH (ref 0.8–1.2)
Prothrombin Time: 37.5 seconds — ABNORMAL HIGH (ref 11.4–15.2)

## 2023-01-29 MED ORDER — TETANUS-DIPHTH-ACELL PERTUSSIS 5-2.5-18.5 LF-MCG/0.5 IM SUSY
0.5000 mL | PREFILLED_SYRINGE | Freq: Once | INTRAMUSCULAR | Status: AC
  Administered 2023-01-29: 0.5 mL via INTRAMUSCULAR
  Filled 2023-01-29: qty 0.5

## 2023-01-29 MED ORDER — LIDOCAINE HCL (PF) 1 % IJ SOLN
INTRAMUSCULAR | Status: AC
  Filled 2023-01-29: qty 30

## 2023-01-29 MED ORDER — DOCUSATE SODIUM 100 MG PO CAPS
100.0000 mg | ORAL_CAPSULE | Freq: Two times a day (BID) | ORAL | Status: DC
  Administered 2023-01-29 – 2023-02-01 (×6): 100 mg via ORAL
  Filled 2023-01-29 (×6): qty 1

## 2023-01-29 MED ORDER — MORPHINE SULFATE (PF) 2 MG/ML IV SOLN: 1.0000 mg | INTRAVENOUS | Status: DC | PRN

## 2023-01-29 MED ORDER — ACETAMINOPHEN 500 MG PO TABS
1000.0000 mg | ORAL_TABLET | Freq: Four times a day (QID) | ORAL | Status: DC
  Administered 2023-01-29 – 2023-02-01 (×13): 1000 mg via ORAL
  Filled 2023-01-29 (×13): qty 2

## 2023-01-29 MED ORDER — ONDANSETRON HCL 4 MG/2ML IJ SOLN: 4.0000 mg | Freq: Four times a day (QID) | INTRAMUSCULAR | Status: DC | PRN

## 2023-01-29 MED ORDER — METHOCARBAMOL 500 MG PO TABS
1000.0000 mg | ORAL_TABLET | Freq: Three times a day (TID) | ORAL | Status: DC
  Administered 2023-01-29 – 2023-02-01 (×9): 1000 mg via ORAL
  Filled 2023-01-29 (×9): qty 2

## 2023-01-29 MED ORDER — ENOXAPARIN SODIUM 30 MG/0.3ML IJ SOSY: 30.0000 mg | PREFILLED_SYRINGE | Freq: Two times a day (BID) | INTRAMUSCULAR | Status: DC

## 2023-01-29 MED ORDER — MORPHINE SULFATE (PF) 2 MG/ML IV SOLN
INTRAVENOUS | Status: AC
  Filled 2023-01-29: qty 1

## 2023-01-29 MED ORDER — IOHEXOL 350 MG/ML SOLN
75.0000 mL | Freq: Once | INTRAVENOUS | Status: AC | PRN
  Administered 2023-01-29: 75 mL via INTRAVENOUS

## 2023-01-29 MED ORDER — MORPHINE SULFATE (PF) 2 MG/ML IV SOLN
2.0000 mg | Freq: Once | INTRAVENOUS | Status: AC
  Administered 2023-01-29: 2 mg via INTRAVENOUS

## 2023-01-29 MED ORDER — ONDANSETRON 4 MG PO TBDP: 4.0000 mg | ORAL_TABLET | Freq: Four times a day (QID) | ORAL | Status: DC | PRN

## 2023-01-29 MED ORDER — OXYCODONE HCL 5 MG PO TABS
2.5000 mg | ORAL_TABLET | ORAL | Status: DC | PRN
  Administered 2023-01-29 – 2023-01-31 (×3): 5 mg via ORAL
  Filled 2023-01-29 (×3): qty 1

## 2023-01-29 NOTE — ED Provider Notes (Addendum)
[facility] Provider Note   CSN: [id number] Arrival date & time: 01/29/23  1626     History  Chief Complaint  Patient presents with   Shortness of [place] is a 72 y.o. male.  Patient status post fall 4 days ago.  Patient was stepping outside either lost his balance or missed the bottom step and fell on his right side with abrasions to his right hand right elbow area.  Patient is on Coumadin for history of deep vein thrombosis been on that now for 2 years.  No history of pulmonary embolism.  Patient reports shortness of breath following the fall none before.  Patient landed on his right side denies hitting his head or loss of consciousness but patient does state that he did face plant.  Patient went to urgent care and they noticed that his room air oxygen sats were 88%.  EMS called to transport him here.  On 4 L of oxygen is 100%.  Working on dropping down to 2 L.  Temp 97.8 heart rate 81 respirations 17 blood pressure 155/109.  Past medical history significant for gastroesophageal reflux disease hypertension hyperlipidemia left leg deep vein thrombosis.  Raynaud's disease.  Patient is never used tobacco products.  Patient is not sure if his tetanus is up-to-date.  In addition patient denies any lower extremity complaints denies any neck pain denies any upper or lower back pain.  Patient does not use oxygen at home.       Home Medications Prior to Admission medications   Medication Sig Start Date End Date Taking? Authorizing Provider  atorvastatin (LIPITOR) 10 MG tablet Take 1 tablet (10 mg total) by mouth daily. 01/08/23   Marrian Salvage, FNP  cholecalciferol (VITAMIN D3) 25 MCG (1000 UNIT) tablet Take 1,000 Units by mouth daily.    [provider]  fluoruracil (FLUOROPLEX) 1 % cream 1 application to affected area    [provider]  gabapentin (NEURONTIN) 100 MG capsule Take 2 capsules  (200 mg total) by mouth at bedtime. 11/22/22   Lyndal Pulley, DO  hydrochlorothiazide (MICROZIDE) 12.5 MG capsule TAKE ONE CAPSULE BY MOUTH ONE TIME DAILY 11/28/22   Marrian Salvage, FNP  losartan (COZAAR) 50 MG tablet TAKE ONE TABLET BY MOUTH ONE TIME DAILY 11/28/22   Marrian Salvage, FNP  naproxen sodium (ALEVE) 220 MG tablet Take 220 mg by mouth as needed. Takes before playing tennis.    [provider]  warfarin (COUMADIN) 5 MG tablet 11/27/2022 Please take Coumadin 10mg  by mouth for two days in a row, on the 3rd day take 5mg . Repeat this schedule until next lab check. 11/27/22   Celso Amy, NP      Allergies    Penicillins and Penicillin v potassium    Review of Systems   Review of Systems  Constitutional:  Negative for chills and fever.  HENT:  Negative for ear pain and sore throat.   Eyes:  Negative for pain and visual disturbance.  Respiratory:  Positive for shortness of breath. Negative for cough.   Cardiovascular:  Negative for chest pain and palpitations.  Gastrointestinal:  Negative for abdominal pain and vomiting.  Genitourinary:  Negative for dysuria and hematuria.  Musculoskeletal:  Negative for arthralgias and back pain.  Skin:  Positive for wound. Negative for color change and rash.  Neurological:  Negative for seizures and syncope.  All other systems reviewed and are  negative.   Physical Exam Updated Vital Signs BP (!) 155/109   Pulse 81   Temp 97.8 F (36.6 C) (Temporal)   Resp 17   Ht 1.803 m (5\' 11" )   Wt 79.8 kg   SpO2 100%   BMI 24.55 kg/m  Physical Exam Vitals and nursing note reviewed.  Constitutional:      General: He is not in acute distress.    Appearance: Normal appearance. He is well-developed.  HENT:     Head: Normocephalic and atraumatic.     Mouth/Throat:     Mouth: Mucous membranes are moist.  Eyes:     Extraocular Movements: Extraocular movements intact.     Conjunctiva/sclera: Conjunctivae normal.      Pupils: Pupils are equal, round, and reactive to light.  Cardiovascular:     Rate and Rhythm: Normal rate and regular rhythm.     Heart sounds: No murmur heard. Pulmonary:     Effort: Pulmonary effort is normal. No respiratory distress.     Comments: Decreased breath sounds on the right. Chest:     Chest wall: No tenderness.  Abdominal:     Palpations: Abdomen is soft.     Tenderness: There is no abdominal tenderness.  Musculoskeletal:        General: Signs of injury present. No swelling.     Cervical back: Normal range of motion and neck supple. No rigidity or tenderness.     Comments: Abrasions that are scabbed to the posterior part of the hand and lateral aspect of the elbow on the right side.  Neurovascular intact distally.  Skin:    General: Skin is warm and dry.     Capillary Refill: Capillary refill takes less than 2 seconds.  Neurological:     General: No focal deficit present.     Mental Status: He is alert and oriented to person, place, and time.     Cranial Nerves: No cranial nerve deficit.  Psychiatric:        Mood and Affect: Mood normal.     ED Results / Procedures / Treatments   Labs (all labs ordered are listed, but only abnormal results are displayed) Labs Reviewed  CBC WITH DIFFERENTIAL/PLATELET  COMPREHENSIVE METABOLIC PANEL  PROTIME-INR    EKG EKG Interpretation  Date/Time:  Monday January 29 2023 16:46:07 EDT Ventricular Rate:  75 PR Interval:  136 QRS Duration: 105 QT Interval:  400 QTC Calculation: 447 R Axis:   89 Text Interpretation: Sinus rhythm Borderline right axis deviation Minimal ST depression, inferior leads Confirmed by Fredia Sorrow [phone]) on 01/29/2023 5:03:49 PM  Radiology DG Chest Port 1 View  Result Date: 01/29/2023 CLINICAL DATA:  Fall 4 days ago with hypoxia EXAM: PORTABLE CHEST 1 VIEW COMPARISON:  Thoracic spine radiograph dated 12/03/2020 FINDINGS: Normal lung volumes. Near-complete atelectasis of the right lung. No focal  left lung consolidation. Large right pneumothorax. The heart size and mediastinal contours are within normal limits. Minimally displaced right anterolateral fourth and fifth ribs. IMPRESSION: 1. Large right pneumothorax with near-complete atelectasis of the right lung. 2. Minimally displaced right anterolateral fourth and fifth ribs. Critical Value/emergent results were called by telephone at the time of interpretation on 01/29/2023 at 5:01 pm to provider Fredia Sorrow , who verbally acknowledged these results. Electronically Signed   By: Darrin Nipper M.D.   On: 01/29/2023 17:04    Procedures Procedures    Medications Ordered in ED Medications  Tdap (BOOSTRIX) injection 0.5 mL (has no administration in time  range)    ED Course/ Medical Decision Making/ A&P                             Medical Decision Making Amount and/or Complexity of Data Reviewed Labs: ordered. Radiology: ordered.  Risk Prescription drug management. Decision regarding hospitalization.   CRITICAL CARE Performed by: Fredia Sorrow Total critical care time: 45 minutes Critical care time was exclusive of separately billable procedures and treating other patients. Critical care was necessary to treat or prevent imminent or life-threatening deterioration. Critical care was time spent personally by me on the following activities: development of treatment plan with patient and/or surrogate as well as nursing, discussions with consultants, evaluation of patient's response to treatment, examination of patient, obtaining history from patient or surrogate, ordering and performing treatments and interventions, ordering and review of laboratory studies, ordering and review of radiographic studies, pulse oximetry and re-evaluation of patient's condition.   Patient satting 100% on 4 L of oxygen will try him on 2 L of oxygen.  Patient needs his tetanus updated.  Will CT his head from since he is on Coumadin.  Will get INR.  Will get  labs.  Will get chest x-ray.  No chest wall tenderness on the right side.  Patient's abrasions to the back of the right hand and right elbow area are healing fine.  Chest x-ray shows evidence of right fourth and fifth rib fractures with 100% pneumothorax.  Patient tolerating this fairly well no evidence of any tension pneumothorax.  Will contact trauma surgery about this but have ordered CT head.  Not sure if they can want CT chest under the circumstances.  Labs are still pending.  Based on this history this occurred 4 days ago.  Discussed with on-call general surgery they are recommending also CT abdomen and pelvis with contrast.  We are getting that done.  General surgery will come in to see the patient and place chest tube.  Patient's INR is elevated.  I-STAT without any significant abnormalities hemoglobin is 14.6.  Renal functions normal.  CBC no leukocytosis hemoglobin 15.6.  Metabolic panel total bili elevated some at 1.9 otherwise LFTs are normal.  GFR is greater than 60.  And INR is 3.8.  CT head without any acute traumatic abnormalities.  CT chest abdomen pelvis pending.  Final Clinical Impression(s) / ED Diagnoses Final diagnoses:  Fall, initial encounter  Multiple abrasions  Shortness of breath  Hypoxia  Traumatic pneumothorax, initial encounter  Closed fracture of multiple ribs of right side, initial encounter    Rx / DC Orders ED Discharge Orders     None         Fredia Sorrow, MD 01/29/23 1647    Fredia Sorrow, MD 01/29/23 1708    Fredia Sorrow, MD 01/29/23 1826

## 2023-01-29 NOTE — Discharge Instructions (Signed)
Patient taken via EMS to the emergency room for hypoxia

## 2023-01-29 NOTE — ED Notes (Signed)
Patient is being discharged from the Urgent Care and sent to the Emergency Department via EMS . Per Rosario Adie NP, patient is in need of higher level of care due to hypoxia. Patient is aware and verbalizes understanding of plan of care.  Vitals:   01/29/23 1509  BP: 121/80  Pulse: 95  Resp: 18  Temp: 98.2 F (36.8 C)  SpO2: (!) 89%

## 2023-01-29 NOTE — ED Notes (Signed)
EMS called

## 2023-01-29 NOTE — ED Notes (Signed)
Trauma Event Note  Helped Dr. Bobbye Morton place R pigtail chest tube.  Placed to -20 H2O suction.  Secured and sutured.  CXR pending.    2mg  morphine given for procedure.  Lidocaine given at site.   Last imported Vital Signs BP (!) 151/84   Pulse 78   Temp 97.8 F (36.6 C) (Temporal)   Resp (!) 22   Ht 5\' 11"  (1.803 m)   Wt 79.8 kg   SpO2 100%   BMI 24.55 kg/m   Trending CBC Recent Labs    01/29/23 1646 01/29/23 1804  WBC 8.6  --   HGB 15.6 14.6  HCT 46.2 43.0  PLT 285  --     Trending Coag's Recent Labs    01/29/23 1646  INR 3.8*    Trending BMET Recent Labs    01/29/23 1646 01/29/23 1804  NA 135 138  K 4.5 3.5  CL 100 102  CO2 25  --   BUN 17 16  CREATININE 0.86 0.70  GLUCOSE 92 90    Raymond [name] W  Trauma Response RN  Please call TRN at [phone] for further assistance.

## 2023-01-29 NOTE — ED Provider Notes (Signed)
[facility]    CSN: [id number] Arrival date & time: 01/29/23  1442      History   Chief Complaint Chief Complaint  Patient presents with   Shortness of Breath   Abdominal Pain   Fall    HPI Raymond Strodtman Sr. is a 72 y.o. male presents for evaluation of shortness of breath after fall.  Patient is accompanied by his neighbor.  Patient reports he fell in his backyard 4 days ago falling face first.  He denies hitting his head or losing consciousness.  He did sustain multiple scrapes and abrasions to his right hand.  He states after that he developed shortness of breath with a cough but denies hemoptysis.  Denies abdominal pain, neck pain, back pain.  States he has been unable to lay flat at night to breathe until yesterday.  He states he had a broken rib in the past and this feels similar but he denies any bruising or chest wall pain.  He does have some mild pain when he takes a deep breath but otherwise no chest wall or rib pain.  He is on Coumadin for history of DVT.  He has been taking over-the-counter Tylenol for his symptoms.  Denies history of asthma or COPD.  No other concerns at this time.   Shortness of Breath Associated symptoms: cough   Abdominal Pain Associated symptoms: cough and shortness of breath   Fall Associated symptoms include shortness of breath.    Past Medical History:  Diagnosis Date   DVT (deep venous thrombosis) (HCC)    GERD (gastroesophageal reflux disease)    Hyperlipidemia    Hypertension    Leg DVT (deep venous thromboembolism), chronic, left ([place]) 12/12/2021    Patient Active Problem List   Diagnosis Date Noted   Pain of right lower extremity due to ischemia 05/01/2022   Leg DVT (deep venous thromboembolism), chronic, left ([place]) 12/12/2021   Mixed hyperlipidemia 11/21/2021   Low libido 11/21/2021   Low back pain 11/21/2021   Loss of appetite 11/21/2021   Hypertensive retinopathy 11/21/2021   Essential hypertension 11/21/2021    Chronic pain 11/21/2021   Enlarged prostate 06/20/2021   Degenerative disc disease, cervical 12/03/2020   Degenerative lumbar disc 09/27/2020   Degenerative arthritis of left knee 09/27/2020    Past Surgical History:  Procedure Laterality Date   EYE SURGERY         Home Medications    Prior to Admission medications   Medication Sig Start Date End Date Taking? Authorizing Provider  atorvastatin (LIPITOR) 10 MG tablet Take 1 tablet (10 mg total) by mouth daily. 01/08/23   Marrian Salvage, FNP  cholecalciferol (VITAMIN D3) 25 MCG (1000 UNIT) tablet Take 1,000 Units by mouth daily.    [provider]  fluoruracil (FLUOROPLEX) 1 % cream 1 application to affected area    [provider]  gabapentin (NEURONTIN) 100 MG capsule Take 2 capsules (200 mg total) by mouth at bedtime. 11/22/22   Lyndal Pulley, DO  hydrochlorothiazide (MICROZIDE) 12.5 MG capsule TAKE ONE CAPSULE BY MOUTH ONE TIME DAILY 11/28/22   Marrian Salvage, FNP  losartan (COZAAR) 50 MG tablet TAKE ONE TABLET BY MOUTH ONE TIME DAILY 11/28/22   Marrian Salvage, FNP  naproxen sodium (ALEVE) 220 MG tablet Take 220 mg by mouth as needed. Takes before playing tennis.    [provider]  warfarin (COUMADIN) 5 MG tablet 11/27/2022 Please take Coumadin 10mg  by mouth for two days  in a row, on the 3rd day take 5mg . Repeat this schedule until next lab check. 11/27/22   Celso Amy, NP    Family History Family History  Problem Relation Age of Onset   Alcohol abuse Mother    Arthritis Mother    Diabetes Mother    Mental retardation Mother    Alcohol abuse Father    Hypertension Father    Cancer Sister    Depression Sister    Cancer Sister    Arthritis Maternal Grandmother    Parkinson's disease Maternal Grandfather     Social History Social History   Tobacco Use   Smoking status: Never  Vaping Use   Vaping Use: Never used  Substance Use Topics   Alcohol use: Not  Currently   Drug use: Not Currently     Allergies   Penicillins and Penicillin v potassium   Review of Systems Review of Systems  Respiratory:  Positive for cough and shortness of breath.      Physical Exam Triage Vital Signs ED Triage Vitals  Enc Vitals Group     BP 01/29/23 1509 121/80     Pulse Rate 01/29/23 1509 95     Resp 01/29/23 1509 18     Temp 01/29/23 1509 98.2 F (36.8 C)     Temp Source 01/29/23 1509 Oral     SpO2 01/29/23 1509 (!) 89 %     Weight --      Height --      Head Circumference --      Peak Flow --      Pain Score 01/29/23 1507 8     Pain Loc --      Pain Edu? --      Excl. in [place]? --    No data found.  Updated Vital Signs BP 121/80 (BP Location: Left Arm)   Pulse 95   Temp 98.2 F (36.8 C) (Oral)   Resp 18   SpO2 (!) 89%   Visual Acuity Right Eye Distance:   Left Eye Distance:   Bilateral Distance:    Right Eye Near:   Left Eye Near:    Bilateral Near:     Physical Exam Vitals reviewed.  Constitutional:      General: He is not in acute distress.    Appearance: He is not ill-appearing.  HENT:     Head: Normocephalic and atraumatic.  Eyes:     Pupils: Pupils are equal, round, and reactive to light.  Cardiovascular:     Rate and Rhythm: Normal rate.  Pulmonary:     Effort: Pulmonary effort is normal. No respiratory distress.     Breath sounds: Normal breath sounds. No stridor. No wheezing or rhonchi.     Comments: Equal chest expansion bilaterally.  No tenderness to palpation to right ribs Chest:     Chest wall: No tenderness.  Abdominal:     General: Bowel sounds are normal.     Palpations: Abdomen is soft.     Tenderness: There is no abdominal tenderness.  Skin:    General: Skin is warm and dry.     Comments: Multiple scabbed skin abrasions to right hand  Neurological:     General: No focal deficit present.     Mental Status: He is alert and oriented to person, place, and time.  Psychiatric:        Mood and  Affect: Mood normal.        Behavior: Behavior normal.  UC Treatments / Results  Labs (all labs ordered are listed, but only abnormal results are displayed) Labs Reviewed - No data to display  EKG   Radiology No results found.  Procedures Procedures (including critical care time)  Medications Ordered in UC Medications - No data to display  Initial Impression / Assessment and Plan / UC Course  I have reviewed the triage vital signs and the nursing notes.  Pertinent labs & imaging results that were available during my care of the patient were reviewed by me and considered in my medical decision making (see chart for details).     Reviewed exam and symptoms with patient in length.  On intake patient oxygen was 88 to 89% on room air.  This did come up to 90 to 91% on 3 L nasal cannula.  Discussed with patient limitations and abilities of urgent care.  Given his hypoxia, and known fall, new shortness of breath, and that he is on blood thinning medication I advised he go to the ER via EMS.  Patient is in agreement with plan and was taken via EMS to the emergency room. Final Clinical Impressions(s) / UC Diagnoses   Final diagnoses:  Hypoxia  Fall, initial encounter  Hx of long term use of blood thinners     Discharge Instructions      Patient taken via EMS to the emergency room for hypoxia     ED Prescriptions   None    PDMP not reviewed this encounter.   Melynda Ripple, NP 01/29/23 [phone]

## 2023-01-29 NOTE — TOC CAGE-AID Note (Signed)
Transition of Care [facility]) - CAGE-AID Screening   Patient Details  Name: Raymond [name]. MRN: [id number] Date of Birth: [birth year]  [facility]) CM/SW Contact:    Army Melia, RN Phone [phone] 01/29/2023, 7:39 PM   CAGE-AID Screening:    Have You Ever Felt You Ought to Cut Down on Your Drinking or Drug Use?: No Have People Annoyed You By Critizing Your Drinking Or Drug Use?: No Have You Felt Bad Or Guilty About Your Drinking Or Drug Use?: No Have You Ever Had a Drink or Used Drugs First Thing In The Morning to Steady Your Nerves or to Get Rid of a Hangover?: No CAGE-AID Score: 0  Substance Abuse Education Offered: No (no hx of drug/alcohol use, no resources indicated)

## 2023-01-29 NOTE — ED Triage Notes (Signed)
Pt presents with c/o SOB, labored breathing and states walking more than 20 yards he feels winded.   States after a fall he had he has had a non productive cough and states he feels he has a busied or cracked rib.

## 2023-01-29 NOTE — ED Notes (Signed)
Taken to CT by this RN.  

## 2023-01-29 NOTE — ED Triage Notes (Signed)
Fell 4 days ago face first. On Coumadin for left leg DVT. Alert and oriented x 4. Reports shortness of breath started after the fall. Landed to his right side. Denies hitting his head or LOC. Pt has some abrasions to right hand. Went to UC today and was 88% on RA. Sent here for further eval.

## 2023-01-29 NOTE — ED Notes (Addendum)
ED TO INPATIENT HANDOFF REPORT  ED Nurse Name and Phone #: [phone] [facility]  S Name/Age/Gender Raymond Like Sr. 72 y.o. male Room/Bed: TRACC/TRACC  Code Status   Code Status: Full Code  Home/SNF/Other Home Patient oriented to: self, place, time, and situation Is this baseline? Yes   Triage Complete: Triage complete  Chief Complaint Traumatic pneumothorax, initial encounter [S27.0XXA]  Triage Note [place] 4 days ago face first. On Coumadin for left leg DVT. Alert and oriented x 4. Reports shortness of breath started after the fall. Landed to his right side. Denies hitting his head or LOC. Pt has some abrasions to right hand. Went to UC today and was 88% on RA. Sent here for further eval.    Allergies Allergies  Allergen Reactions   Penicillins Hives and Rash        Penicillin V Potassium Other (See Comments)    Level of Care/Admitting Diagnosis ED Disposition     ED Disposition  Admit   Condition  --   Comment  Hospital Area: [place] [[id number]]  Level of Care: Med-Surg [16]  May admit patient to [facility] or [facility] if equivalent level of care is available:: No  Covid Evaluation: Asymptomatic - no recent exposure (last 10 days) testing not required  Diagnosis: Traumatic pneumothorax, initial encounter [id number]  Admitting Physician: TRAUMA MD [id number]  Attending Physician: TRAUMA MD [id number]  Certification:: I certify this patient will need inpatient services for at least 2 midnights  Estimated Length of Stay: 9          B Medical/Surgery History Past Medical History:  Diagnosis Date   DDD (degenerative disc disease), lumbar    DVT (deep venous thrombosis) (HCC)    GERD (gastroesophageal reflux disease)    Hyperlipidemia    Hypertension    Leg DVT (deep venous thromboembolism), chronic, left ([place]) 12/12/2021   Raynaud disease    Past Surgical History:  Procedure Laterality Date   EYE SURGERY       A IV  Location/Drains/Wounds Patient Lines/Drains/Airways Status     Active Line/Drains/Airways     Name Placement date Placement time Site Days   Peripheral IV 01/29/23 20 G Right Antecubital 01/29/23  1745  Antecubital  less than 1   Chest Tube 1 Lateral;Right Pleural 01/29/23  1905  Pleural  less than 1            Intake/Output Last 24 hours No intake or output data in the 24 hours ending 01/29/23 1912  Labs/Imaging Results for orders placed or performed during the hospital encounter of 01/29/23 (from the past 48 hour(s))  CBC with Differential/Platelet     Status: None   Collection Time: 01/29/23  4:46 PM  Result Value Ref Range   WBC 8.6 4.0 - 10.5 K/uL   RBC 4.71 4.22 - 5.81 MIL/uL   Hemoglobin 15.6 13.0 - 17.0 g/dL   HCT 46.2 39.0 - 52.0 %   MCV 98.1 80.0 - 100.0 fL   MCH 33.1 26.0 - 34.0 pg   MCHC 33.8 30.0 - 36.0 g/dL   RDW 12.9 11.5 - 15.5 %   Platelets 285 150 - 400 K/uL   nRBC 0.0 0.0 - 0.2 %   Neutrophils Relative % 56 %   Neutro Abs 4.8 1.7 - 7.7 K/uL   Lymphocytes Relative 27 %   Lymphs Abs 2.3 0.7 - 4.0 K/uL   Monocytes Relative 11 %   Monocytes Absolute 1.0 0.1 - 1.0 K/uL  Eosinophils Relative 5 %   Eosinophils Absolute 0.5 0.0 - 0.5 K/uL   Basophils Relative 1 %   Basophils Absolute 0.0 0.0 - 0.1 K/uL   Immature Granulocytes 0 %   Abs Immature Granulocytes 0.03 0.00 - 0.07 K/uL    Comment: Performed at [facility], [place] [address], [place], [place] [address]  Comprehensive metabolic panel     Status: Abnormal   Collection Time: 01/29/23  4:46 PM  Result Value Ref Range   Sodium 135 135 - 145 mmol/L   Potassium 4.5 3.5 - 5.1 mmol/L   Chloride 100 98 - 111 mmol/L   CO2 25 22 - 32 mmol/L   Glucose, Bld 92 70 - 99 mg/dL    Comment: Glucose reference range applies only to samples taken after fasting for at least 8 hours.   BUN 17 8 - 23 mg/dL   Creatinine, Ser 0.86 0.61 - 1.24 mg/dL   Calcium 8.7 (L) 8.9 - 10.3 mg/dL   Total Protein 7.1 6.5 -  8.1 g/dL   Albumin 3.8 3.5 - 5.0 g/dL   AST 34 15 - 41 U/L   ALT 18 0 - 44 U/L   Alkaline Phosphatase 97 38 - 126 U/L   Total Bilirubin 1.9 (H) 0.3 - 1.2 mg/dL   GFR, Estimated >60 >60 mL/min    Comment: (NOTE) Calculated using the CKD-EPI Creatinine Equation (2021)    Anion gap 10 5 - 15    Comment: Performed at [facility] [address], [place], [place] [address]  Protime-INR     Status: Abnormal   Collection Time: 01/29/23  4:46 PM  Result Value Ref Range   Prothrombin Time 37.5 (H) 11.4 - 15.2 seconds   INR 3.8 (H) 0.8 - 1.2    Comment: (NOTE) INR goal varies based on device and disease states. Performed at [facility], [place] [address], [place], [place] [address]   I-stat chem 8, ED (not at [facility], [facility] or [facility])     Status: Abnormal   Collection Time: 01/29/23  6:04 PM  Result Value Ref Range   Sodium 138 135 - 145 mmol/L   Potassium 3.5 3.5 - 5.1 mmol/L   Chloride 102 98 - 111 mmol/L   BUN 16 8 - 23 mg/dL   Creatinine, Ser 0.70 0.61 - 1.24 mg/dL   Glucose, Bld 90 70 - 99 mg/dL    Comment: Glucose reference range applies only to samples taken after fasting for at least 8 hours.   Calcium, Ion 1.06 (L) 1.15 - 1.40 mmol/L   TCO2 24 22 - 32 mmol/L   Hemoglobin 14.6 13.0 - 17.0 g/dL   HCT 43.0 39.0 - 52.0 %   CT Head Wo Contrast  Result Date: 01/29/2023 CLINICAL DATA:  Head trauma EXAM: CT HEAD WITHOUT CONTRAST TECHNIQUE: Contiguous axial images were obtained from the base of the skull through the vertex without intravenous contrast. RADIATION DOSE REDUCTION: This exam was performed according to the departmental dose-optimization program which includes automated exposure control, adjustment of the mA and/or kV according to patient size and/or use of iterative reconstruction technique. COMPARISON:  05/06/2022 FINDINGS: Brain: Hyperdense focus in the posterior left frontal lobe, adjacent to the left lateral ventricle (series 5, image 38 and series 3, image 22), with  mild surrounding hypodensity, which was likely present on the prior exam but not as prominent. There is an adjacent blood vessel, which may represent a small cavernous malformation with associated  developmental venous anomaly. No evidence of acute infarction, additional hemorrhage, mass, mass effect, or midline shift. No hydrocephalus or extra-axial fluid collection. Vascular: No hyperdense vessel. Skull: Negative for fracture or focal lesion. Sinuses/Orbits: No acute finding. Status post bilateral lens replacements. Other: The mastoid air cells are well aerated. IMPRESSION: Hyperdense focus in the posterior left frontal lobe, adjacent to the left lateral ventricle, with mild surrounding hypodensity, possibly edema. 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 MRI with and without contrast is recommended for further evaluation. These results were called by telephone at the time of interpretation on 01/29/2023 at 5:50 pm to provider Fredia Sorrow , who verbally acknowledged these results. Electronically Signed   By: Merilyn Baba M.D.   On: 01/29/2023 17:53   DG Chest Port 1 View  Result Date: 01/29/2023 CLINICAL DATA:  Fall 4 days ago with hypoxia EXAM: PORTABLE CHEST 1 VIEW COMPARISON:  Thoracic spine radiograph dated 12/03/2020 FINDINGS: Normal lung volumes. Near-complete atelectasis of the right lung. No focal left lung consolidation. Large right pneumothorax. The heart size and mediastinal contours are within normal limits. Minimally displaced right anterolateral fourth and fifth ribs. IMPRESSION: 1. Large right pneumothorax with near-complete atelectasis of the right lung. 2. Minimally displaced right anterolateral fourth and fifth ribs. Critical Value/emergent results were called by telephone at the time of interpretation on 01/29/2023 at 5:01 pm to provider Fredia Sorrow , who verbally acknowledged these results.  Electronically Signed   By: Darrin Nipper M.D.   On: 01/29/2023 17:04    Pending Labs Unresulted Labs (From admission, onward)    None       Vitals/Pain Today's Vitals   01/29/23 1845 01/29/23 1850 01/29/23 1855 01/29/23 1900  BP: (!) 160/88 (!) 139/111 (!) 157/82 (!) 151/84  Pulse: 82 74 75 78  Resp: 19 (!) 22 (!) 26 (!) 22  Temp:      TempSrc:      SpO2: 98% 99% 100% 100%  Weight:      Height:      PainSc:        Isolation Precautions No active isolations  Medications Medications  acetaminophen (TYLENOL) tablet 1,000 mg (has no administration in time range)  oxyCODONE (Oxy IR/ROXICODONE) immediate release tablet 2.5-5 mg (has no administration in time range)  morphine (PF) 2 MG/ML injection 1-2 mg (has no administration in time range)  docusate sodium (COLACE) capsule 100 mg (has no administration in time range)  ondansetron (ZOFRAN-ODT) disintegrating tablet 4 mg (has no administration in time range)    Or  ondansetron (ZOFRAN) injection 4 mg (has no administration in time range)  enoxaparin (LOVENOX) injection 30 mg (has no administration in time range)  methocarbamol (ROBAXIN) tablet 1,000 mg (has no administration in time range)  morphine (PF) 2 MG/ML injection (0 mg  Hold 01/29/23 1855)  Tdap (BOOSTRIX) injection 0.5 mL (0.5 mLs Intramuscular Given 01/29/23 1745)  lidocaine (PF) (XYLOCAINE) 1 % injection (  Given 01/29/23 1856)  iohexol (OMNIPAQUE) 350 MG/ML injection 75 mL (75 mLs Intravenous Contrast Given 01/29/23 1849)  morphine (PF) 2 MG/ML injection 2 mg (2 mg Intravenous Given 01/29/23 1855)    Mobility walks with device     R Recommendations: See Admitting Provider Note  Report given to:   Additional Notes: Provider placing a chest tube to right later chest wall. Pt a fall risk. A&O x3 at baseline

## 2023-01-29 NOTE — Procedures (Signed)
   Procedure Note  Date: 01/29/2023  Procedure: tube thoracostomy--right    Pre-op diagnosis: right pneumothorax  Post-op diagnosis: same  Surgeon: Jesusita Oka, MD  Anesthesia: local   EBL: <5cc procedural; 0cc     evacuated Drains/Implants: 38F chest tube Specimen: none  Description of procedure: Time-out was performed verifying correct patient, procedure, site, laterality, and signature of informed consent. Thirsty cc's of local anesthetic was infiltrated into the tissues just over the fourth intercostal space.  A small skin nick was made at the fourth intercostal space. An introducer needle was inserted and a guidewire inserted through the needle. The needle was removed and the tract dilated. The chest tube was inserted over the guidewire and the guidewire removed.   The tube was secured at the skin with suture and connected to an atrium at -20cm water wall suction. Immediate output from the chest tube was 0cc. The site was dressed with xeroform, gauze, and tape. The patient tolerated the procedure well. There were no complications. Follow up chest x-ray was ordered to confirm tube positioning, complete evacuation, and complete lung re-expansion.    Jesusita Oka, MD General and [facility] [facility]

## 2023-01-29 NOTE — H&P (Addendum)
Reason for Consult/Chief Complaint: PTX Consultant: Rogene Houston, MD  Raymond Like Sr. is an 72 y.o. male.   HPI: 34M s/p fall down 3-4 stairs on 01/25/23 landing on brick pavement. Reports landing "face first", but denies hitting his head and denies LOC. Denies dizziness or lightheadedness before falling, but also does not remember tripping on anything. Has been short of breath, went to UC and was sent to [facility]. Sees Dr. Marin Olp for DVT in LLE 10/2021, for which he is on Coumadin . Single episode of DVT.  Past Medical History:  Diagnosis Date   DDD (degenerative disc disease), lumbar    DVT (deep venous thrombosis) (HCC)    GERD (gastroesophageal reflux disease)    Hyperlipidemia    Hypertension    Leg DVT (deep venous thromboembolism), chronic, left ([place]) 12/12/2021   Raynaud disease     Past Surgical History:  Procedure Laterality Date   EYE SURGERY      Family History  Problem Relation Age of Onset   Alcohol abuse Mother    Arthritis Mother    Diabetes Mother    Mental retardation Mother    Alcohol abuse Father    Hypertension Father    Cancer Sister    Depression Sister    Cancer Sister    Arthritis Maternal Grandmother    Parkinson's disease Maternal Grandfather     Social History:  reports that he has never smoked. He does not have any smokeless tobacco history on file. He reports that he does not currently use alcohol. He reports that he does not currently use drugs.  Allergies:  Allergies  Allergen Reactions   Penicillins Hives and Rash        Penicillin V Potassium Other (See Comments)    Medications: I have reviewed the patient's current medications.  Results for orders placed or performed during the hospital encounter of 01/29/23 (from the past 48 hour(s))  CBC with Differential/Platelet     Status: None   Collection Time: 01/29/23  4:46 PM  Result Value Ref Range   WBC 8.6 4.0 - 10.5 K/uL   RBC 4.71 4.22 - 5.81 MIL/uL   Hemoglobin 15.6  13.0 - 17.0 g/dL   HCT 46.2 39.0 - 52.0 %   MCV 98.1 80.0 - 100.0 fL   MCH 33.1 26.0 - 34.0 pg   MCHC 33.8 30.0 - 36.0 g/dL   RDW 12.9 11.5 - 15.5 %   Platelets 285 150 - 400 K/uL   nRBC 0.0 0.0 - 0.2 %   Neutrophils Relative % 56 %   Neutro Abs 4.8 1.7 - 7.7 K/uL   Lymphocytes Relative 27 %   Lymphs Abs 2.3 0.7 - 4.0 K/uL   Monocytes Relative 11 %   Monocytes Absolute 1.0 0.1 - 1.0 K/uL   Eosinophils Relative 5 %   Eosinophils Absolute 0.5 0.0 - 0.5 K/uL   Basophils Relative 1 %   Basophils Absolute 0.0 0.0 - 0.1 K/uL   Immature Granulocytes 0 %   Abs Immature Granulocytes 0.03 0.00 - 0.07 K/uL    Comment: Performed at [facility], [address] [place], [place] [address]  Comprehensive metabolic panel     Status: Abnormal   Collection Time: 01/29/23  4:46 PM  Result Value Ref Range   Sodium 135 135 - 145 mmol/L   Potassium 4.5 3.5 - 5.1 mmol/L   Chloride 100 98 - 111 mmol/L   CO2 25 22 - 32 mmol/L  Glucose, Bld 92 70 - 99 mg/dL    Comment: Glucose reference range applies only to samples taken after fasting for at least 8 hours.   BUN 17 8 - 23 mg/dL   Creatinine, Ser 0.86 0.61 - 1.24 mg/dL   Calcium 8.7 (L) 8.9 - 10.3 mg/dL   Total Protein 7.1 6.5 - 8.1 g/dL   Albumin 3.8 3.5 - 5.0 g/dL   AST 34 15 - 41 U/L   ALT 18 0 - 44 U/L   Alkaline Phosphatase 97 38 - 126 U/L   Total Bilirubin 1.9 (H) 0.3 - 1.2 mg/dL   GFR, Estimated >60 >60 mL/min    Comment: (NOTE) Calculated using the CKD-EPI Creatinine Equation (2021)    Anion gap 10 5 - 15    Comment: Performed at [facility] [address] [place], [place] [address]  Protime-INR     Status: Abnormal   Collection Time: 01/29/23  4:46 PM  Result Value Ref Range   Prothrombin Time 37.5 (H) 11.4 - 15.2 seconds   INR 3.8 (H) 0.8 - 1.2    Comment: (NOTE) INR goal varies based on device and disease states. Performed at [facility], [place] [address], [place], [place] [address]   I-stat chem 8, ED  (not at [facility], [facility] or [facility])     Status: Abnormal   Collection Time: 01/29/23  6:04 PM  Result Value Ref Range   Sodium 138 135 - 145 mmol/L   Potassium 3.5 3.5 - 5.1 mmol/L   Chloride 102 98 - 111 mmol/L   BUN 16 8 - 23 mg/dL   Creatinine, Ser 0.70 0.61 - 1.24 mg/dL   Glucose, Bld 90 70 - 99 mg/dL    Comment: Glucose reference range applies only to samples taken after fasting for at least 8 hours.   Calcium, Ion 1.06 (L) 1.15 - 1.40 mmol/L   TCO2 24 22 - 32 mmol/L   Hemoglobin 14.6 13.0 - 17.0 g/dL   HCT 43.0 39.0 - 52.0 %    CT Head Wo Contrast  Result Date: 01/29/2023 CLINICAL DATA:  Head trauma EXAM: CT HEAD WITHOUT CONTRAST TECHNIQUE: Contiguous axial images were obtained from the base of the skull through the vertex without intravenous contrast. RADIATION DOSE REDUCTION: This exam was performed according to the departmental dose-optimization program which includes automated exposure control, adjustment of the mA and/or kV according to patient size and/or use of iterative reconstruction technique. COMPARISON:  05/06/2022 FINDINGS: Brain: Hyperdense focus in the posterior left frontal lobe, adjacent to the left lateral ventricle (series 5, image 38 and series 3, image 22), with mild surrounding hypodensity, which was likely present on the prior exam but not as prominent. 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 No evidence of acute infarction, additional hemorrhage, mass, mass effect, or midline shift. No hydrocephalus or extra-axial fluid collection. Vascular: No hyperdense vessel. Skull: Negative for fracture or focal lesion. Sinuses/Orbits: No acute finding. Status post bilateral lens replacements. Other: The mastoid air cells are well aerated. IMPRESSION: Hyperdense focus in the posterior left frontal lobe, adjacent to the left lateral ventricle, with mild surrounding hypodensity, possibly edema. 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 MRI with and without contrast is recommended for further evaluation. These results were called by telephone at the time of interpretation on 01/29/2023 at 5:50 pm to provider [facility] ,  who verbally acknowledged these results. Electronically Signed   By: Merilyn Baba M.D.   On: 01/29/2023 17:53   DG Chest Port 1 View  Result Date: 01/29/2023 CLINICAL DATA:  Fall 4 days ago with hypoxia EXAM: PORTABLE CHEST 1 VIEW COMPARISON:  Thoracic spine radiograph dated 12/03/2020 FINDINGS: Normal lung volumes. Near-complete atelectasis of the right lung. No focal left lung consolidation. Large right pneumothorax. The heart size and mediastinal contours are within normal limits. Minimally displaced right anterolateral fourth and fifth ribs. IMPRESSION: 1. Large right pneumothorax with near-complete atelectasis of the right lung. 2. Minimally displaced right anterolateral fourth and fifth ribs. Critical Value/emergent results were called by telephone at the time of interpretation on 01/29/2023 at 5:01 pm to provider Fredia Sorrow , who verbally acknowledged these results. Electronically Signed   By: Darrin Nipper M.D.   On: 01/29/2023 17:04    ROS 10 point review of systems is negative except as listed above in HPI.   Physical Exam Blood pressure (!) 132/93, pulse 68, temperature 97.8 F (36.6 C), temperature source Temporal, resp. rate (!) 22, height 5\' 11"  (1.803 m), weight 79.8 kg, SpO2 99 %. Constitutional: well-developed, well-nourished HEENT: pupils equal, round, reactive to light, 84mm b/l, moist conjunctiva, external inspection of ears and nose normal, hearing intact Oropharynx: normal oropharyngeal mucosa, normal dentition Neck: no thyromegaly, trachea midline, no midline cervical tenderness to palpation Chest: breath sounds equal bilaterally, normal respiratory effort, no midline or  lateral chest wall tenderness to palpation/deformity Abdomen: soft, NT, no bruising, no hepatosplenomegaly GU: no blood at urethral meatus of penis, no scrotal masses or abnormality  Back: no wounds, no thoracic/lumbar spine tenderness to palpation, no thoracic/lumbar spine stepoffs Rectal: deferred Extremities: 2+ radial and pedal pulses bilaterally, intact motor and sensation bilateral UE and LE, no peripheral edema, abrasions R hand MSK: unable to assess gait/station, no clubbing/cyanosis of fingers/toes, normal ROM of all four extremities Skin: warm, dry, no rashes Psych: normal memory, normal mood/affect     Assessment/Plan: 57M s/p GLF   R PTX - CT placed in ED. To -20 sxn for at least 24h. IS, pulm toilet.  R rib fx 4-5 - pain control, IS/pulm toilet Abrasions R hand - local wound care H/o DVT - on coumadin, hold, repeat LLE [place] Medical decision-maker, code status - patient elects for his daughter, Pamalee Leyden, to be his medical decision-maker, phone number confirmed in the chart. Discussed code status options of intubation, compressions, vasopressors/anti-arrhythmics, and debrifillation. Patient declines all interventions. Significant other present for this discussion. Code status electronically changed to DNR. FEN - regular diet DVT - SCDs, repeat LLE u/s, LMWH, hold coumadin and allow INR to drift Dispo - admit to inpatient, med-surg    Jesusita Oka, MD General and [facility]

## 2023-01-29 NOTE — ED Notes (Signed)
Pt concerned of collapsed lung

## 2023-01-30 ENCOUNTER — Inpatient Hospital Stay (HOSPITAL_COMMUNITY): Payer: Medicare Other

## 2023-01-30 ENCOUNTER — Other Ambulatory Visit: Payer: Self-pay

## 2023-01-30 ENCOUNTER — Encounter: Payer: Self-pay | Admitting: Family

## 2023-01-30 DIAGNOSIS — I82402 Acute embolism and thrombosis of unspecified deep veins of left lower extremity: Secondary | ICD-10-CM

## 2023-01-30 LAB — CBC
HCT: 47.2 % (ref 39.0–52.0)
Hemoglobin: 15.4 g/dL (ref 13.0–17.0)
MCH: 31.8 pg (ref 26.0–34.0)
MCHC: 32.6 g/dL (ref 30.0–36.0)
MCV: 97.5 fL (ref 80.0–100.0)
Platelets: 242 10*3/uL (ref 150–400)
RBC: 4.84 MIL/uL (ref 4.22–5.81)
RDW: 12.8 % (ref 11.5–15.5)
WBC: 8.4 10*3/uL (ref 4.0–10.5)
nRBC: 0 % (ref 0.0–0.2)

## 2023-01-30 LAB — BASIC METABOLIC PANEL
Anion gap: 10 (ref 5–15)
BUN: 22 mg/dL (ref 8–23)
CO2: 26 mmol/L (ref 22–32)
Calcium: 8.7 mg/dL — ABNORMAL LOW (ref 8.9–10.3)
Chloride: 102 mmol/L (ref 98–111)
Creatinine, Ser: 0.9 mg/dL (ref 0.61–1.24)
GFR, Estimated: >60 mL/min (ref >60)
Glucose, Bld: 93 mg/dL (ref 70–99)
Potassium: 4.2 mmol/L (ref 3.5–5.1)
Sodium: 138 mmol/L (ref 135–145)

## 2023-01-30 LAB — PROTIME-INR
INR: 4.4 (ref 0.8–1.2)
Prothrombin Time: 41.9 seconds — ABNORMAL HIGH (ref 11.4–15.2)

## 2023-01-30 MED ORDER — LOSARTAN POTASSIUM 50 MG PO TABS
50.0000 mg | ORAL_TABLET | Freq: Every day | ORAL | Status: DC
  Administered 2023-01-30 – 2023-02-01 (×3): 50 mg via ORAL
  Filled 2023-01-30 (×3): qty 1

## 2023-01-30 MED ORDER — FOLIC ACID 1 MG PO TABS
1.0000 mg | ORAL_TABLET | Freq: Every day | ORAL | Status: DC
  Administered 2023-01-30 – 2023-02-01 (×3): 1 mg via ORAL
  Filled 2023-01-30 (×3): qty 1

## 2023-01-30 MED ORDER — ADULT MULTIVITAMIN W/MINERALS CH
1.0000 | ORAL_TABLET | Freq: Every day | ORAL | Status: DC
  Administered 2023-01-30 – 2023-02-01 (×3): 1 via ORAL
  Filled 2023-01-30 (×3): qty 1

## 2023-01-30 MED ORDER — LORAZEPAM 2 MG/ML IJ SOLN: 1.0000 mg | INTRAMUSCULAR | Status: DC | PRN

## 2023-01-30 MED ORDER — THIAMINE MONONITRATE 100 MG PO TABS
100.0000 mg | ORAL_TABLET | Freq: Every day | ORAL | Status: DC
  Administered 2023-01-30 – 2023-02-01 (×3): 100 mg via ORAL
  Filled 2023-01-30 (×3): qty 1

## 2023-01-30 MED ORDER — LORAZEPAM 1 MG PO TABS: 1.0000 mg | ORAL_TABLET | ORAL | Status: DC | PRN

## 2023-01-30 MED ORDER — THIAMINE HCL 100 MG/ML IJ SOLN
100.0000 mg | Freq: Every day | INTRAMUSCULAR | Status: DC
  Filled 2023-01-30 (×2): qty 2

## 2023-01-30 NOTE — Evaluation (Signed)
Occupational Therapy Evaluation Patient Details Name: Raymond Stjuste Sr. MRN: [id number] DOB: 1951-01-18 Today's Date: 01/30/2023   History of Present Illness 72 y/o male s/p fall down 3-4 stairs on 01/25/23 landing on brick pavement. Fractured right ribs 4-5. Chest tube placed 01/29/23.   Clinical Impression   Patient evaluated by Occupational Therapy with no further acute OT needs identified. All education has been completed and the patient has no further questions. Prior to admit, pt was independent with all ADL tasks, functional mobility and driving. Pt is very active at baseline and enjoys playing tennis. Pt is very close to his baseline and is limited at this time due to chest tube management, soreness/pain, and fatigue. I do not see an immediate need for follow up OT services at this time. Recommended that pt continue to get up and use the bathroom and walk with nursing staff as much as he can tolerate to continue to build up his endurance. Pt has a strong support system from his Daughter and Significant Other, Peggy who are able to provide assist as needed once discharged.  OT is signing off. Thank you for this referral.       Recommendations for follow up therapy are one component of a multi-disciplinary discharge planning process, led by the attending physician.  Recommendations may be updated based on patient status, additional functional criteria and insurance authorization.   Follow Up Recommendations  No OT follow up     Assistance Recommended at Discharge PRN  Patient can return home with the following Assistance with cooking/housework    Functional Status Assessment  Patient has had a recent decline in their functional status and demonstrates the ability to make significant improvements in function in a reasonable and predictable amount of time.  Equipment Recommendations  None recommended by OT       Precautions / Restrictions Precautions Precautions:  None Precaution Comments: right rib fractures 4-5, right side chest tube Restrictions Weight Bearing Restrictions: No      Mobility Bed Mobility Overal bed mobility:  (Seated on EOB upon arrival.)     Patient Response: Cooperative  Transfers Overall transfer level: Modified independent Equipment used: None   General transfer comment: Slow and cautious movements due to lines/tubes and pain.      Balance Overall balance assessment: No apparent balance deficits (not formally assessed)     ADL either performed or assessed with clinical judgement   ADL Overall ADL's : Modified independent    General ADL Comments: Slow moving due to soreness and feeling stiff during functional mobility. Completed LB dressing seated EOB. Reports baseline difficulty of crossing LLE over RLE and unable to don sock to left foot. Normally sits on a bench and brings LE up to him although unable to demonstrate this date due to pain. Significant other, Raymond [name] is able to provide assist with LB dressing when needed.     Vision Baseline Vision/History: 1 Wears glasses (for reading. prescription glasses for distance if needed) Ability to See in Adequate Light: 0 Adequate Patient Visual Report: No change from baseline Vision Assessment?: No apparent visual deficits            Pertinent Vitals/Pain Pain Assessment Pain Assessment: 0-10 Pain Score: 4  Pain Location: right ribs (slightly). upper right back Pain Descriptors / Indicators: Sore Pain Intervention(s): Limited activity within patient's tolerance, Monitored during session     Hand Dominance Left   Extremity/Trunk Assessment Upper Extremity Assessment Upper Extremity Assessment: Overall WFL for tasks assessed  Lower Extremity Assessment Lower Extremity Assessment: Defer to PT evaluation   Cervical / Trunk Assessment Cervical / Trunk Assessment: Normal   Communication Communication Communication: No difficulties   Cognition  Arousal/Alertness: Awake/alert Behavior During Therapy: WFL for tasks assessed/performed Overall Cognitive Status: Within Functional Limits for tasks assessed       General Comments  SpO2 remained at 92% on 2L O2 during activity and when seated EOB.            Home Living Family/patient expects to be discharged to:: Private residence Living Arrangements: Alone Available Help at Discharge: Family;Available 24 hours/day;Friend(s) (Daughter and GF) Type of Home: House Home Access: Stairs to enter (through [employer]) Technical brewer of Steps: 3-4 Entrance Stairs-Rails: Right Home Layout: Other (Comment) (needs to drain the humidifer once a day.)     Bathroom Shower/Tub: Occupational psychologist: Standard     Home Equipment: Conservation officer, nature (2 wheels)          Prior Functioning/Environment Prior Level of Function : Independent/Modified Independent;Driving   Mobility Comments: Does not use any AD for ambulation. ADLs Comments: Enjoys playing tennis.        OT Problem List: Decreased strength      OT Treatment/Interventions:   Eval only   OT Goals(Current goals can be found in the care plan section) Acute Rehab OT Goals Patient Stated Goal: to brush his teeth  OT Frequency:  1X visit       AM-PAC OT "6 Clicks" Daily Activity     Outcome Measure Help from another person eating meals?: None Help from another person taking care of personal grooming?: None Help from another person toileting, which includes using toliet, bedpan, or urinal?: A Little Help from another person bathing (including washing, rinsing, drying)?: A Little Help from another person to put on and taking off regular upper body clothing?: A Little Help from another person to put on and taking off regular lower body clothing?: A Little 6 Click Score: 20   End of Session Equipment Utilized During Treatment: Oxygen  Activity Tolerance: Patient tolerated treatment well Patient left: in  chair;with call bell/phone within reach;with family/visitor present  OT Visit Diagnosis: Muscle weakness (generalized) (M62.81)                Time: [id number] OT Time Calculation (min): 33 min Charges:  OT General Charges $OT Visit: 1 Visit OT Evaluation $OT Eval Moderate Complexity: 1 Mod  [employer], OTR/L,CBIS  Supplemental OT - [facility] and [facility] Secure Chat Preferred    Kemisha Bonnette, Clarene Duke 01/30/2023, 11:00 AM

## 2023-01-30 NOTE — Progress Notes (Signed)
Subjective: CC: Fell in backyard on 3/14 in backyard when he lost his balance.  Pain only over R chest tube. Well controlled with po medications. Some sob. On o2. Doesn't wear at baseline. Bruising on right hand. Denies pain over this area. Able to make a fist and extend all digits without pain. No other areas of pain. Hasn't been oob since admission. About to eat breakfast. Voiding without issues. No IS in room.    Drinks 1-2 glasses of wine/bottles of beer every day. No hx withdraw. No tobacco or drug use. Lives at home by himself. Does not ambulate with assistive devices. Active at baseline, planning tennis etc. Has a GF and daughter that live nearby and can assist at d/c if needed.    Objective: Vital signs in last 24 hours: Temp:  [97.3 F (36.3 C)-98.5 F (36.9 C)] 98 F (36.7 C) (03/19 0340) Pulse Rate:  [58-95] 65 (03/19 0340) Resp:  [16-28] 16 (03/19 0340) BP: (110-160)/(74-111) 110/74 (03/19 0340) SpO2:  [89 %-100 %] 96 % (03/19 0340) Weight:  [79.8 kg] 79.8 kg (03/18 1637) Last BM Date : 01/29/23  Intake/Output from previous day: 03/18 0701 - 03/19 0700 In: 480 [P.O.:480] Out: 500 [Urine:500] Intake/Output this shift: No intake/output data recorded.  PE: Gen:  Alert, NAD, pleasant HEENT: EOM's intact, pupils equal and round Card:  Reg Pulm:  CTAB, no W/R/R, effort normal. R chest tube on -20 with +air leak. Scant drainage in [place]. 1000 on IS.  Abd: Soft, ND, NT  Ext: No ttp over R wrist or hand. Able active rom without reported pain. Otherwise able active rom of major joints of BUE and BLE's without reported pain. No gross deformities. No LE edema.  Psych: A&Ox3  Neuro: MAE's. CN 3-12 grossly intact. Non-focal.  Skin:Bruising to R hand. Scattered abrasions to LE's  Lab Results:  Recent Labs    01/29/23 1646 01/29/23 1804  WBC 8.6  --   HGB 15.6 14.6  HCT 46.2 43.0  PLT 285  --    BMET Recent Labs    01/29/23 1646 01/29/23 1804  NA 135 138   K 4.5 3.5  CL 100 102  CO2 25  --   GLUCOSE 92 90  BUN 17 16  CREATININE 0.86 0.70  CALCIUM 8.7*  --    PT/INR Recent Labs    01/29/23 1646  LABPROT 37.5*  INR 3.8*   CMP     Component Value Date/Time   NA 138 01/29/2023 1804   K 3.5 01/29/2023 1804   CL 102 01/29/2023 1804   CO2 25 01/29/2023 1646   GLUCOSE 90 01/29/2023 1804   BUN 16 01/29/2023 1804   CREATININE 0.70 01/29/2023 1804   CREATININE 0.88 11/08/2022 1249   CALCIUM 8.7 (L) 01/29/2023 1646   PROT 7.1 01/29/2023 1646   ALBUMIN 3.8 01/29/2023 1646   AST 34 01/29/2023 1646   AST 23 11/08/2022 1249   ALT 18 01/29/2023 1646   ALT 19 11/08/2022 1249   ALKPHOS 97 01/29/2023 1646   BILITOT 1.9 (H) 01/29/2023 1646   BILITOT 0.8 11/08/2022 1249   GFRNONAA >60 01/29/2023 1646   GFRNONAA >60 11/08/2022 1249   Lipase     Component Value Date/Time   LIPASE 43.0 06/29/2022 1413    Studies/Results: DG Chest Port 1 View  Result Date: 01/30/2023 CLINICAL DATA:  72 year old male with history of traumatic pneumothorax. EXAM: PORTABLE CHEST 1 VIEW COMPARISON:  Chest x-ray 01/29/2023. FINDINGS: Small  bore right-sided chest tube in place with tip reformed in the lateral aspect of the upper right hemithorax. Persistent small right-sided pneumothorax is noted occupying approximately 5% of the volume of the right hemithorax. Irregular opacities in the right lung presumably reflective of residual areas of atelectasis and/or scarring, most evident in the medial aspect of the right base. There are left basilar opacities as well, likely reflective of regions of subsegmental atelectasis, increased compared to the prior study. Trace left pleural effusion. No evidence of pulmonary edema. Heart size is normal. Upper mediastinal contours are within normal limits allowing for patient rotation to the right. IMPRESSION: 1. Right-sided chest tube is similar in position with small right pneumothorax and extensive areas of atelectasis and/or  scarring in the right lung. 2. Increasing subsegmental atelectasis in the left lower lobe with trace left pleural effusion. Electronically Signed   By: Vinnie Langton M.D.   On: 01/30/2023 06:31   DG Chest Portable 1 View  Result Date: 01/29/2023 CLINICAL DATA:  Status post chest tube placement. EXAM: PORTABLE CHEST 1 VIEW COMPARISON:  January 29, 2023 FINDINGS: Since the prior study there has been interval placement of a right-sided chest tube. Its distal tip is seen along the periphery of the right apex. A small residual right apical pneumothorax is noted. The heart size and mediastinal contours are within normal limits. Interval expansion of a large portion of the right lung is seen with mild to moderate severity right apical and right perihilar scarring and/or atelectasis. Lateral fourth right rib fracture is seen. IMPRESSION: Interval right-sided chest tube placement, as described above, with a small residual right apical pneumothorax. Electronically Signed   By: Virgina Norfolk M.D.   On: 01/29/2023 19:57   CT CHEST ABDOMEN PELVIS W CONTRAST  Result Date: 01/29/2023 CLINICAL DATA:  Chest trauma, blunt EXAM: CT CHEST, ABDOMEN, AND PELVIS WITH CONTRAST TECHNIQUE: Multidetector CT imaging of the chest, abdomen and pelvis was performed following the standard protocol during bolus administration of intravenous contrast. RADIATION DOSE REDUCTION: This exam was performed according to the departmental dose-optimization program which includes automated exposure control, adjustment of the mA and/or kV according to patient size and/or use of iterative reconstruction technique. CONTRAST:  39mL OMNIPAQUE IOHEXOL 350 MG/ML SOLN COMPARISON:  None Available. FINDINGS: CT CHEST FINDINGS Cardiovascular: Heart is normal size. Aorta is normal caliber. Mediastinum/Nodes: No mediastinal, hilar, or axillary adenopathy. Trachea and esophagus are unremarkable. Thyroid unremarkable. Lungs/Pleura: Large right pneumothorax with  atelectatic right lung. Left lung clear. No effusions or pneumothorax on the left. Musculoskeletal: Fractures through the lateral right 4th and 5th ribs. Subcutaneous emphysema noted in the right chest wall. CT ABDOMEN PELVIS FINDINGS Hepatobiliary: 2.3 cm cyst in the inferior right hepatic lobe. No evidence of a hepatic injury or perihepatic hematoma. Gallbladder unremarkable. Pancreas: No focal abnormality or ductal dilatation. Spleen: No splenic injury or perisplenic hematoma. Adrenals/Urinary Tract: No adrenal hemorrhage or renal injury identified. Bladder is unremarkable. Stomach/Bowel: Sigmoid diverticulosis. No active diverticulitis. Stomach and small bowel decompressed, unremarkable. Normal appendix. Vascular/Lymphatic: No evidence of aneurysm or adenopathy. Reproductive: No visible focal abnormality. Other: No free fluid or free air. Musculoskeletal: No acute bony abnormality. IMPRESSION: Right lateral 4th and 5th rib fractures with large right pneumothorax and collapse/atelectasis of the right lung. No acute findings or evidence of significant traumatic injury in the abdomen or pelvis. Critical Value/emergent results were called by telephone at the time of interpretation on 01/29/2023 at 7:15 pm to provider Fredia Sorrow , who verbally acknowledged these  results. Electronically Signed   By: Rolm Baptise M.D.   On: 01/29/2023 19:17   CT Head Wo Contrast  Result Date: 01/29/2023 CLINICAL DATA:  Head trauma EXAM: CT HEAD WITHOUT CONTRAST TECHNIQUE: Contiguous axial images were obtained from the base of the skull through the vertex without intravenous contrast. RADIATION DOSE REDUCTION: This exam was performed according to the departmental dose-optimization program which includes automated exposure control, adjustment of the mA and/or kV according to patient size and/or use of iterative reconstruction technique. COMPARISON:  05/06/2022 FINDINGS: Brain: Hyperdense focus in the posterior left frontal lobe,  adjacent to the left lateral ventricle (series 5, image 38 and series 3, image 22), with mild surrounding hypodensity, which was likely present on the prior exam but not as prominent. 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 No evidence of acute infarction, additional hemorrhage, mass, mass effect, or midline shift. No hydrocephalus or extra-axial fluid collection. Vascular: No hyperdense vessel. Skull: Negative for fracture or focal lesion. Sinuses/Orbits: No acute finding. Status post bilateral lens replacements. Other: The mastoid air cells are well aerated. IMPRESSION: Hyperdense focus in the posterior left frontal lobe, adjacent to the left lateral ventricle, with mild surrounding hypodensity, possibly edema. 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 MRI with and without contrast is recommended for further evaluation. These results were called by telephone at the time of interpretation on 01/29/2023 at 5:50 pm to provider Fredia Sorrow , who verbally acknowledged these results. Electronically Signed   By: Merilyn Baba M.D.   On: 01/29/2023 17:53   DG Chest Port 1 View  Result Date: 01/29/2023 CLINICAL DATA:  Fall 4 days ago with hypoxia EXAM: PORTABLE CHEST 1 VIEW COMPARISON:  Thoracic spine radiograph dated 12/03/2020 FINDINGS: Normal lung volumes. Near-complete atelectasis of the right lung. No focal left lung consolidation. Large right pneumothorax. The heart size and mediastinal contours are within normal limits. Minimally displaced right anterolateral fourth and fifth ribs. IMPRESSION: 1. Large right pneumothorax with near-complete atelectasis of the right lung. 2. Minimally displaced right anterolateral fourth and fifth ribs. Critical Value/emergent results were called by telephone at the time of interpretation on 01/29/2023 at  5:01 pm to provider Fredia Sorrow , who verbally acknowledged these results. Electronically Signed   By: Darrin Nipper M.D.   On: 01/29/2023 17:04    Anti-infectives: Anti-infectives (From admission, onward)    None        Assessment/Plan 37M s/p GLF 3/14   R PTX - CT placed in ED 3/18. +air leak this am. CXR this AM w/ small residual ptx. Will continue -20 sxn today and repeat CXR in am. IS, pulm toilet. Cont 2L o2 today R rib fx 4-5 - pain control, IS/pulm toilet. Therapies.  Abrasions R hand - local wound care H/o LLE DVT - Follows with Hematology for this. Last [place] with chronic thrombus of the distal left femoral and gastrocnemius veins. On coumadin, hold, repeat LLE [place] Abnormal CTH - CTH with possible small cavernous malformation with associated developmental venous anomaly. Will review with neuro to see if needs further imaging/workup as an inpatient vs follow up as an outpatient.  Etoh use - will place on CIWA.  Hx HTN - elevated overnight. Restart home meds Hx HLD  DNR FEN - regular diet DVT - SCDs, repeat LLE u/s, ppx lovenox. Hold coumadin and allow INR to drift down (3.8 yesterday, repeat  labs pending today).  ID - Tdap given in ED. None currently.  Dispo - admit to inpatient, med-surg. Chest tube management. Consult [place]er[place]. Labs pending. Therapies.   I reviewed nursing notes, last 24 h vitals and pain scores, last 48 h intake and output, last 24 h labs and trends, and last 24 h imaging results.    LOS: 1 day    Jillyn Ledger , [facility] Surgery 01/30/2023, 7:59 AM Please see Amion for pager number during day hours 7:00am-4:30pm

## 2023-01-30 NOTE — Evaluation (Signed)
Physical Therapy Evaluation Patient Details Name: Raymond Sayani Sr. MRN: [id number] DOB: 12/21/1950 Today's Date: 01/30/2023  History of Present Illness  72 y/o male s/p fall down 3-4 stairs on 01/25/23 landing on brick pavement. Continued to have increasing SOB at home, found to have pneumothorax and fractured right ribs 4-5. Chest tube placed 01/29/23. PMH includes: DDD (degenerative disc disease), lumbar, GERD (gastroesophageal reflux disease), Hyperlipidemia, Hypertension,  Leg DVT (deep venous thromboembolism), chronic, left ([place]) 12/12/2021, Raynaud disease   Clinical Impression  Patient received in recliner. He is agreeable to PT assessment. Patient is able to stand from recliner with supervision. Ambulated 200 feet without AD on 2 liters of O2, supervision. Slow, purposeful ambulation with trunk slightly flexed. Patient will continue to benefit from skilled PT to improve functional independence and safety for return home.         Recommendations for follow up therapy are one component of a multi-disciplinary discharge planning process, led by the attending physician.  Recommendations may be updated based on patient status, additional functional criteria and insurance authorization.  Follow Up Recommendations No PT follow up      Assistance Recommended at Discharge PRN  Patient can return home with the following  A little help with walking and/or transfers;A little help with bathing/dressing/bathroom;Assist for transportation;Assistance with cooking/housework;Help with stairs or ramp for entrance    Equipment Recommendations None recommended by PT  Recommendations for Other Services       Functional Status Assessment Patient has had a recent decline in their functional status and demonstrates the ability to make significant improvements in function in a reasonable and predictable amount of time.     Precautions / Restrictions Precautions Precautions: Fall Precaution  Comments: right rib fractures 4-5, right side chest tube Restrictions Weight Bearing Restrictions: No      Mobility  Bed Mobility               General bed mobility comments: NT, patient in recliner    Transfers Overall transfer level: Modified independent Equipment used: None               General transfer comment: Slow and cautious movements due to lines/tubes and pain.    Ambulation/Gait Ambulation/Gait assistance: Supervision Gait Distance (Feet): 200 Feet Assistive device: None Gait Pattern/deviations: Step-through pattern, Decreased step length - right, Decreased step length - left, Trunk flexed Gait velocity: decr     General Gait Details: slow, purposeful ambulation. Discomfort in upper back. Ambulated on 2 liters O2  Stairs            Wheelchair Mobility    Modified Rankin (Stroke Patients Only)       Balance Overall balance assessment: Needs assistance Sitting-balance support: Feet supported Sitting balance-Leahy Scale: Normal     Standing balance support: No upper extremity supported, During functional activity Standing balance-Leahy Scale: Good                               Pertinent Vitals/Pain Pain Assessment Pain Assessment: Faces Pain Score: 4  Faces Pain Scale: Hurts a little bit Pain Location: right ribs (slightly). upper right back Pain Descriptors / Indicators: Discomfort, Sore Pain Intervention(s): Monitored during session, Patient requesting pain meds-RN notified    Home Living Family/patient expects to be discharged to:: Private residence Living Arrangements: Alone Available Help at Discharge: Family;Available 24 hours/day;Friend(s) Type of Home: House Home Access: Stairs to enter;Level entry Entrance Stairs-Rails: Right Entrance  Stairs-Number of Steps: 3-4- has level entry in front   Home Layout: Other (Comment) Home Equipment: Rolling Walker (2 wheels)      Prior Function Prior Level of  Function : Independent/Modified Independent;Driving             Mobility Comments: Does not use any AD for ambulation. ADLs Comments: Enjoys playing tennis.     Hand Dominance   Dominant Hand: Left    Extremity/Trunk Assessment   Upper Extremity Assessment Upper Extremity Assessment: Overall WFL for tasks assessed    Lower Extremity Assessment Lower Extremity Assessment: Defer to PT evaluation    Cervical / Trunk Assessment Cervical / Trunk Assessment: Normal  Communication   Communication: No difficulties  Cognition Arousal/Alertness: Awake/alert Behavior During Therapy: WFL for tasks assessed/performed Overall Cognitive Status: Within Functional Limits for tasks assessed                                          General Comments General comments (skin integrity, edema, etc.): SpO2 remained at 92% on 2L O2 during activity and when seated EOB.    Exercises     Assessment/Plan    PT Assessment Patient needs continued PT services  PT Problem List Decreased mobility;Decreased activity tolerance;Pain       PT Treatment Interventions Therapeutic exercise;Gait training;Stair training;Functional mobility training;Therapeutic activities;Patient/family education    PT Goals (Current goals can be found in the Care Plan section)  Acute Rehab PT Goals Patient Stated Goal: to feel better, return home PT Goal Formulation: With patient Time For Goal Achievement: 02/06/23 Potential to Achieve Goals: Good    Frequency Min 3X/week     Co-evaluation               AM-PAC PT "6 Clicks" Mobility  Outcome Measure Help needed turning from your back to your side while in a flat bed without using bedrails?: A Little Help needed moving from lying on your back to sitting on the side of a flat bed without using bedrails?: A Little Help needed moving to and from a bed to a chair (including a wheelchair)?: A Little Help needed standing up from a chair using  your arms (e.g., wheelchair or bedside chair)?: A Little Help needed to walk in hospital room?: A Little Help needed climbing 3-5 steps with a railing? : A Little 6 Click Score: 18    End of Session Equipment Utilized During Treatment: Oxygen Activity Tolerance: Patient tolerated treatment well Patient left: in chair;with call bell/phone within reach;with family/visitor present Nurse Communication: Mobility status;Patient requests pain meds PT Visit Diagnosis: Other abnormalities of gait and mobility (R26.89);Difficulty in walking, not elsewhere classified (R26.2);Pain Pain - Right/Left: Right    Time: [id number] PT Time Calculation (min) (ACUTE ONLY): 16 min   Charges:   PT Evaluation $PT Eval Moderate Complexity: 1 Mod          Jermesha Sottile, PT, GCS 01/30/23,11:51 AM

## 2023-01-30 NOTE — Progress Notes (Signed)
LLE venous duplex has been completed.  Results can be found under chart review under CV PROC. 01/30/2023 4:16 PM Lavayah Vita RVT, RDMS

## 2023-01-30 NOTE — TOC Initial Note (Signed)
Transition of Care (TOC) - Initial/Assessment Note    Patient Details  Name: Raymond Hullum Sr. MRN: [id number] Date of Birth: [birth year]  Transition of Care [facility]) CM/SW Contact:    Ella Bodo, RN Phone Number: 01/30/2023, 5:05 PM  Clinical Narrative:                 72 y/o male s/p fall down 3-4 stairs on 01/25/23 landing on brick pavement. Continued to have increasing SOB at home, found to have pneumothorax and fractured right ribs 4-5. Chest tube placed 01/29/23.  PTA, pt independent and living at home alone; he states he will have assistance from daughter and girlfriend at discharge. No DME recommended.  Will follow progress.   Expected Discharge Plan: Home/Self Care Barriers to Discharge: Continued Medical Work up              Expected Discharge Plan and Services   Discharge Planning Services: CM Consult   Living arrangements for the past 2 months: Single Family Home                                      Prior Living Arrangements/Services Living arrangements for the past 2 months: Single Family Home Lives with:: Self Patient language and need for interpreter reviewed:: Yes Do you feel safe going back to the place where you live?: Yes      Need for Family Participation in Patient Care: Yes (Comment) Care giver support system in place?: Yes (comment)   Criminal Activity/Legal Involvement Pertinent to Current Situation/Hospitalization: No - Comment as needed  Activities of Daily Living Home Assistive Devices/Equipment: None ADL Screening (condition at time of admission) Patient's cognitive ability adequate to safely complete daily activities?: Yes Is the patient deaf or have difficulty hearing?: No Does the patient have difficulty seeing, even when wearing glasses/contacts?: No Does the patient have difficulty concentrating, remembering, or making decisions?: No Patient able to express need for assistance with ADLs?: Yes Does the patient have  difficulty dressing or bathing?: Yes Independently performs ADLs?: No Communication: Independent Dressing (OT): Needs assistance Is this a change from baseline?: Change from baseline, expected to last <3days Grooming: Needs assistance Is this a change from baseline?: Change from baseline, expected to last <3 days Feeding: Independent Bathing: Needs assistance Is this a change from baseline?: Change from baseline, expected to last <3 days Toileting: Needs assistance Is this a change from baseline?: Change from baseline, expected to last <3 days In/Out Bed: Needs assistance Is this a change from baseline?: Change from baseline, expected to last <3 days Walks in Home: Needs assistance Is this a change from baseline?: Change from baseline, expected to last <3 days Does the patient have difficulty walking or climbing stairs?: No Weakness of Legs: None Weakness of Arms/Hands: Right                 Emotional Assessment   Attitude/Demeanor/Rapport: Engaged Affect (typically observed): Accepting Orientation: : Oriented to Self, Oriented to Place, Oriented to  Time, Oriented to Situation      Admission diagnosis:  Shortness of breath [R06.02] Coagulopathy ([place]) [D68.9] Hypoxia [R09.02] Multiple abrasions [T07.XXXA] Traumatic pneumothorax, initial encounter [S27.0XXA] Fall, initial encounter [id number]] Closed fracture of multiple ribs of right side, initial encounter [S22.41XA] Patient Active Problem List   Diagnosis Date Noted   Traumatic pneumothorax, initial encounter 01/29/2023   Pain of right lower extremity due to ischemia  05/01/2022   Leg DVT (deep venous thromboembolism), chronic, left ([place]) 12/12/2021   Mixed hyperlipidemia 11/21/2021   Low libido 11/21/2021   Low back pain 11/21/2021   Loss of appetite 11/21/2021   Hypertensive retinopathy 11/21/2021   Essential hypertension 11/21/2021   Chronic pain 11/21/2021   Enlarged prostate 06/20/2021   Degenerative disc  disease, cervical 12/03/2020   Degenerative lumbar disc 09/27/2020   Degenerative arthritis of left knee 09/27/2020   PCP:  Marrian Salvage, FNP Pharmacy:   [facility] [address] [place], [place]. AT [address] [place]. [place] [address] Phone: [phone] Fax: [phone]     Social Determinants of Health (SDOH) Social History: [place] Capo: No Food Insecurity (01/29/2023)  Housing: Low Risk  (01/29/2023)  Transportation Needs: No Transportation Needs (01/30/2023)  Utilities: Not At Risk (01/30/2023)  Alcohol Screen: Low Risk  (06/01/2022)  Depression (PHQ2-9): Low Risk  (06/29/2022)  Recent Concern: Depression (PHQ2-9) - Medium Risk (06/01/2022)  Financial Resource Strain: Medium Risk (06/01/2022)  Physical Activity: Sufficiently Active (06/01/2022)  Social Connections: Moderately Isolated (06/01/2022)  Stress: Stress Concern Present (06/01/2022)  Tobacco Use: Unknown (01/29/2023)   SDOH Interventions:     Readmission Risk Interventions     No data to display         Reinaldo Raddle, RN, BSN  Trauma/Neuro ICU Case Manager [phone]

## 2023-01-31 ENCOUNTER — Inpatient Hospital Stay (HOSPITAL_COMMUNITY): Payer: Medicare Other

## 2023-01-31 LAB — CBC
HCT: 43.9 % (ref 39.0–52.0)
Hemoglobin: 14.1 g/dL (ref 13.0–17.0)
MCH: 31.6 pg (ref 26.0–34.0)
MCHC: 32.1 g/dL (ref 30.0–36.0)
MCV: 98.4 fL (ref 80.0–100.0)
Platelets: 244 10*3/uL (ref 150–400)
RBC: 4.46 MIL/uL (ref 4.22–5.81)
RDW: 12.9 % (ref 11.5–15.5)
WBC: 8.3 10*3/uL (ref 4.0–10.5)
nRBC: 0 % (ref 0.0–0.2)

## 2023-01-31 LAB — BASIC METABOLIC PANEL
Anion gap: 6 (ref 5–15)
BUN: 27 mg/dL — ABNORMAL HIGH (ref 8–23)
CO2: 27 mmol/L (ref 22–32)
Calcium: 8.4 mg/dL — ABNORMAL LOW (ref 8.9–10.3)
Chloride: 105 mmol/L (ref 98–111)
Creatinine, Ser: 0.86 mg/dL (ref 0.61–1.24)
GFR, Estimated: >60 mL/min (ref >60)
Glucose, Bld: 109 mg/dL — ABNORMAL HIGH (ref 70–99)
Potassium: 3.9 mmol/L (ref 3.5–5.1)
Sodium: 138 mmol/L (ref 135–145)

## 2023-01-31 LAB — PROTIME-INR
INR: 4.6 (ref 0.8–1.2)
Prothrombin Time: 43.1 s — ABNORMAL HIGH (ref 11.4–15.2)

## 2023-01-31 MED ORDER — GABAPENTIN 100 MG PO CAPS
200.0000 mg | ORAL_CAPSULE | Freq: Every day | ORAL | Status: DC
  Administered 2023-01-31: 200 mg via ORAL
  Filled 2023-01-31: qty 2

## 2023-01-31 MED ORDER — GADOBUTROL 1 MMOL/ML IV SOLN
7.5000 mL | Freq: Once | INTRAVENOUS | Status: AC | PRN
  Administered 2023-01-31: 7.5 mL via INTRAVENOUS

## 2023-01-31 MED ORDER — ATORVASTATIN CALCIUM 10 MG PO TABS
10.0000 mg | ORAL_TABLET | Freq: Every day | ORAL | Status: DC
  Administered 2023-01-31 – 2023-02-01 (×2): 10 mg via ORAL
  Filled 2023-01-31 (×2): qty 1

## 2023-01-31 NOTE — Progress Notes (Addendum)
Subjective: CC: Discomfort over R ribs/chest tube. Controlled on scheduled po medications. Tolerating diet without n/v. Voiding. No bm. Working with IS.   Seen by PT. Able to stand from recliner with supervision. Ambulated 200 feet without AD. No PT f/u recommended.   AFVSS. CT, 5cc output. Voiding. INR still elevated at 4.6. Labs otherwise reassuring. CXR read pending but looks stable with small R apical ptx on my read. No prn pain meds yesterday. LE [place] with chronic deep vein thrombosis involving the left femoral vein.   Objective: Vital signs in last 24 hours: Temp:  [97.5 F (36.4 C)-97.9 F (36.6 C)] 97.5 F (36.4 C) (03/20 0415) Pulse Rate:  [65-72] 65 (03/20 0415) Resp:  [17] 17 (03/19 1552) BP: (119-121)/(71-74) 121/71 (03/20 0415) SpO2:  [93 %-97 %] 97 % (03/20 0415) Last BM Date : 01/29/23  Intake/Output from previous day: 03/19 0701 - 03/20 0700 In: 240 [P.O.:240] Out: 5 [Chest Tube:5] Intake/Output this shift: No intake/output data recorded.  PE: Gen:  Alert, NAD, pleasant Card:  Reg Pulm:  CTAB, no W/R/R, effort normal. R chest tube on -20. No air leak. Scant drainage in sahara.  Abd: Soft, ND, NT  Ext: No LE edema.  Psych: A&Ox3   Lab Results:  Recent Labs    01/30/23 0849 01/31/23 0055  WBC 8.4 8.3  HGB 15.4 14.1  HCT 47.2 43.9  PLT 242 244    BMET Recent Labs    01/30/23 0849 01/31/23 0055  NA 138 138  K 4.2 3.9  CL 102 105  CO2 26 27  GLUCOSE 93 109*  BUN 22 27*  CREATININE 0.90 0.86  CALCIUM 8.7* 8.4*    PT/INR Recent Labs    01/30/23 0849 01/31/23 0055  LABPROT 41.9* 43.1*  INR 4.4* 4.6*    CMP     Component Value Date/Time   NA 138 01/31/2023 0055   K 3.9 01/31/2023 0055   CL 105 01/31/2023 0055   CO2 27 01/31/2023 0055   GLUCOSE 109 (H) 01/31/2023 0055   BUN 27 (H) 01/31/2023 0055   CREATININE 0.86 01/31/2023 0055   CREATININE 0.88 11/08/2022 1249   CALCIUM 8.4 (L) 01/31/2023 0055   PROT 7.1 01/29/2023  1646   ALBUMIN 3.8 01/29/2023 1646   AST 34 01/29/2023 1646   AST 23 11/08/2022 1249   ALT 18 01/29/2023 1646   ALT 19 11/08/2022 1249   ALKPHOS 97 01/29/2023 1646   BILITOT 1.9 (H) 01/29/2023 1646   BILITOT 0.8 11/08/2022 1249   GFRNONAA >60 01/31/2023 0055   GFRNONAA >60 11/08/2022 1249   Lipase     Component Value Date/Time   LIPASE 43.0 06/29/2022 1413    Studies/Results: VAS [place] LOWER EXTREMITY VENOUS (DVT)  Result Date: 01/30/2023  Lower Venous DVT Study Patient Name:  Raymond Phinney Sr.  Date of Exam:   01/30/2023 Medical Rec #: [id number]                 Accession #:    [id number] Date of Birth: Sep 20, 1951                  Patient Gender: M Patient Age:   72 years Exam Location:  [facility] Procedure:      VAS [place] LOWER EXTREMITY VENOUS (DVT) Referring Phys: Reather Laurence --------------------------------------------------------------------------------  Indications: Follow up exam.  Risk Factors: DVT LLE 11/06/2021. Anticoagulation: Coumadin prior to admission. Comparison Study: Previous exam on 08/16/2022 was positive  for LLE chronic DVT of                   distal femoral and gastroc veins. Performing Technologist: Rogelia Rohrer RVT, RDMS  Examination Guidelines: A complete evaluation includes B-mode imaging, spectral Doppler, color Doppler, and power Doppler as needed of all accessible portions of each vessel. Bilateral testing is considered an integral part of a complete examination. Limited examinations for reoccurring indications may be performed as noted. The reflux portion of the exam is performed with the patient in reverse Trendelenburg.  +-----+---------------+---------+-----------+----------+--------------+ RIGHTCompressibilityPhasicitySpontaneityPropertiesThrombus Aging +-----+---------------+---------+-----------+----------+--------------+ CFV  Full           Yes      Yes                                  +-----+---------------+---------+-----------+----------+--------------+   +---------+---------------+---------+-----------+----------+--------------+ LEFT     CompressibilityPhasicitySpontaneityPropertiesThrombus Aging +---------+---------------+---------+-----------+----------+--------------+ CFV      Full           Yes      Yes                                 +---------+---------------+---------+-----------+----------+--------------+ SFJ      Full                                                        +---------+---------------+---------+-----------+----------+--------------+ FV Prox  Full           Yes      Yes                                 +---------+---------------+---------+-----------+----------+--------------+ FV Mid   Full           Yes      Yes                                 +---------+---------------+---------+-----------+----------+--------------+ FV DistalPartial        Yes      Yes                  Chronic        +---------+---------------+---------+-----------+----------+--------------+ PFV      Full                                                        +---------+---------------+---------+-----------+----------+--------------+ POP      Full           Yes      Yes                                 +---------+---------------+---------+-----------+----------+--------------+ PTV      Full                                                        +---------+---------------+---------+-----------+----------+--------------+  PERO     Full                                                        +---------+---------------+---------+-----------+----------+--------------+ Gastroc  Full                                                        +---------+---------------+---------+-----------+----------+--------------+     Summary: RIGHT: - No evidence of common femoral vein obstruction.  LEFT: - Findings consistent with chronic deep vein  thrombosis involving the left femoral vein. - No cystic structure found in the popliteal fossa.  *See table(s) above for measurements and observations. Electronically signed by Deitra Mayo MD on 01/30/2023 at 4:42:08 PM.    Final    DG Chest Port 1 View  Result Date: 01/30/2023 CLINICAL DATA:  72 year old male with history of traumatic pneumothorax. EXAM: PORTABLE CHEST 1 VIEW COMPARISON:  Chest x-ray 01/29/2023. FINDINGS: Small bore right-sided chest tube in place with tip reformed in the lateral aspect of the upper right hemithorax. Persistent small right-sided pneumothorax is noted occupying approximately 5% of the volume of the right hemithorax. Irregular opacities in the right lung presumably reflective of residual areas of atelectasis and/or scarring, most evident in the medial aspect of the right base. There are left basilar opacities as well, likely reflective of regions of subsegmental atelectasis, increased compared to the prior study. Trace left pleural effusion. No evidence of pulmonary edema. Heart size is normal. Upper mediastinal contours are within normal limits allowing for patient rotation to the right. IMPRESSION: 1. Right-sided chest tube is similar in position with small right pneumothorax and extensive areas of atelectasis and/or scarring in the right lung. 2. Increasing subsegmental atelectasis in the left lower lobe with trace left pleural effusion. Electronically Signed   By: Vinnie Langton M.D.   On: 01/30/2023 06:31   DG Chest Portable 1 View  Result Date: 01/29/2023 CLINICAL DATA:  Status post chest tube placement. EXAM: PORTABLE CHEST 1 VIEW COMPARISON:  January 29, 2023 FINDINGS: Since the prior study there has been interval placement of a right-sided chest tube. Its distal tip is seen along the periphery of the right apex. A small residual right apical pneumothorax is noted. The heart size and mediastinal contours are within normal limits. Interval expansion of a large  portion of the right lung is seen with mild to moderate severity right apical and right perihilar scarring and/or atelectasis. Lateral fourth right rib fracture is seen. IMPRESSION: Interval right-sided chest tube placement, as described above, with a small residual right apical pneumothorax. Electronically Signed   By: Virgina Norfolk M.D.   On: 01/29/2023 19:57   CT CHEST ABDOMEN PELVIS W CONTRAST  Result Date: 01/29/2023 CLINICAL DATA:  Chest trauma, blunt EXAM: CT CHEST, ABDOMEN, AND PELVIS WITH CONTRAST TECHNIQUE: Multidetector CT imaging of the chest, abdomen and pelvis was performed following the standard protocol during bolus administration of intravenous contrast. RADIATION DOSE REDUCTION: This exam was performed according to the departmental dose-optimization program which includes automated exposure control, adjustment of the mA and/or kV according to patient size and/or use of iterative reconstruction technique. CONTRAST:  15mL OMNIPAQUE IOHEXOL 350  MG/ML SOLN COMPARISON:  None Available. FINDINGS: CT CHEST FINDINGS Cardiovascular: Heart is normal size. Aorta is normal caliber. Mediastinum/Nodes: No mediastinal, hilar, or axillary adenopathy. Trachea and esophagus are unremarkable. Thyroid unremarkable. Lungs/Pleura: Large right pneumothorax with atelectatic right lung. Left lung clear. No effusions or pneumothorax on the left. Musculoskeletal: Fractures through the lateral right 4th and 5th ribs. Subcutaneous emphysema noted in the right chest wall. CT ABDOMEN PELVIS FINDINGS Hepatobiliary: 2.3 cm cyst in the inferior right hepatic lobe. No evidence of a hepatic injury or perihepatic hematoma. Gallbladder unremarkable. Pancreas: No focal abnormality or ductal dilatation. Spleen: No splenic injury or perisplenic hematoma. Adrenals/Urinary Tract: No adrenal hemorrhage or renal injury identified. Bladder is unremarkable. Stomach/Bowel: Sigmoid diverticulosis. No active diverticulitis. Stomach and  small bowel decompressed, unremarkable. Normal appendix. Vascular/Lymphatic: No evidence of aneurysm or adenopathy. Reproductive: No visible focal abnormality. Other: No free fluid or free air. Musculoskeletal: No acute bony abnormality. IMPRESSION: Right lateral 4th and 5th rib fractures with large right pneumothorax and collapse/atelectasis of the right lung. No acute findings or evidence of significant traumatic injury in the abdomen or pelvis. Critical Value/emergent results were called by telephone at the time of interpretation on 01/29/2023 at 7:15 pm to provider Fredia Sorrow , who verbally acknowledged these results. Electronically Signed   By: Rolm Baptise M.D.   On: 01/29/2023 19:17   CT Head Wo Contrast  Result Date: 01/29/2023 CLINICAL DATA:  Head trauma EXAM: CT HEAD WITHOUT CONTRAST TECHNIQUE: Contiguous axial images were obtained from the base of the skull through the vertex without intravenous contrast. RADIATION DOSE REDUCTION: This exam was performed according to the departmental dose-optimization program which includes automated exposure control, adjustment of the mA and/or kV according to patient size and/or use of iterative reconstruction technique. COMPARISON:  05/06/2022 FINDINGS: Brain: Hyperdense focus in the posterior left frontal lobe, adjacent to the left lateral ventricle (series 5, image 38 and series 3, image 22), with mild surrounding hypodensity, which was likely present on the prior exam but not as prominent. 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 No evidence of acute infarction, additional hemorrhage, mass, mass effect, or midline shift. No hydrocephalus or extra-axial fluid collection. Vascular: No hyperdense vessel. Skull: Negative for fracture or focal lesion. Sinuses/Orbits: No acute finding. Status post bilateral lens replacements. Other: The mastoid air cells are well aerated.  IMPRESSION: Hyperdense focus in the posterior left frontal lobe, adjacent to the left lateral ventricle, with mild surrounding hypodensity, possibly edema. 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 MRI with and without contrast is recommended for further evaluation. These results were called by telephone at the time of interpretation on 01/29/2023 at 5:50 pm to provider Fredia Sorrow , who verbally acknowledged these results. Electronically Signed   By: Merilyn Baba M.D.   On: 01/29/2023 17:53   DG Chest Port 1 View  Result Date: 01/29/2023 CLINICAL DATA:  Fall 4 days ago with hypoxia EXAM: PORTABLE CHEST 1 VIEW COMPARISON:  Thoracic spine radiograph dated 12/03/2020 FINDINGS: Normal lung volumes. Near-complete atelectasis of the right lung. No focal left lung consolidation. Large right pneumothorax. The heart size and mediastinal contours are within normal limits. Minimally displaced right anterolateral fourth and fifth ribs. IMPRESSION: 1. Large right pneumothorax with near-complete atelectasis of the right lung. 2. Minimally displaced right anterolateral fourth and fifth ribs. Critical Value/emergent results were called by telephone at the time  of interpretation on 01/29/2023 at 5:01 pm to provider Fredia Sorrow , who verbally acknowledged these results. Electronically Signed   By: Darrin Nipper M.D.   On: 01/29/2023 17:04    Anti-infectives: Anti-infectives (From admission, onward)    None        Assessment/Plan 89M s/p GLF 3/14   R PTX - CT placed in ED 3/18. Air leak resolved. On -20 sxn. Repeat CXR pending. If improved, plan to [facility] CT. Repeat CXR in am. IS, pulm toilet. Wean o2 to RA.   R rib fx 4-5 - pain control, IS/pulm toilet. Therapies.  Abrasions R hand - local wound care. No ttp on exam or with rom. If develops pain, consider xray.  H/o LLE DVT - Follows with Hematology for this. Last [place] with chronic  thrombus of the distal left femoral and gastrocnemius veins. [place] here with chronic dvt of left femoral vein. On coumadin at baseline. 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 Discussed with Neuro. Recommended MRI with and without contrast.  Etoh use - CIWA.  Hx HTN - Home meds Hx HLD  DNR FEN - regular diet DVT - SCDs, INR 4.6 ID - Tdap given in ED. None currently.  Dispo - Chest tube management.   I reviewed nursing notes, last 24 h vitals and pain scores, last 48 h intake and output, last 24 h labs and trends, and last 24 h imaging results.    LOS: 2 days    Jillyn Ledger , [facility] 01/31/2023, 7:35 AM Please see Amion for pager number during day hours 7:00am-4:30pm

## 2023-01-31 NOTE — Progress Notes (Signed)
PT Cancellation Note  Patient Details Name: Raymond [name] Sr. MRN: [id number] DOB: 07-30-1951   Cancelled Treatment:    Reason Eval/Treat Not Completed: Other (comment) (Pt was just up with mobility specialist and had just gotten back into bed. O2 sats did well and O2 was removed. Pt encouraged to get up in chair for about 30 min later in the day.  Will continue to follow as able and appropriate)  Tomma Rakers, DPT, [place] Office: [phone] (Secure chat preferred)   Ander Purpura 01/31/2023, 3:35 PM

## 2023-01-31 NOTE — Progress Notes (Signed)
Handoff report given to [facility] LPN, to assume care for this pt at this time.

## 2023-01-31 NOTE — Progress Notes (Signed)
   01/31/23 1600  Mobility  Activity Ambulated independently in hallway  Level of Assistance Independent  Assistive Device None  Distance Ambulated (ft) 350 ft  Activity Response Tolerated well  Mobility Referral Yes  $Mobility charge 1 Mobility   Mobility Specialist Progress Note  Pre-Mobility: 94% SpO2(RA) During Mobility: 92-94% SpO2(RA) Post-Mobility: 94% SpO2(RA)  Pt was in bed and agreeable. Had c/o pain at chest tube site. Returned to bed w/ all needs met and call bell in reach.   Lucious Groves Mobility Specialist  Please contact via SecureChat or Rehab office at [phone]

## 2023-02-01 ENCOUNTER — Inpatient Hospital Stay (HOSPITAL_COMMUNITY): Payer: Medicare Other

## 2023-02-01 LAB — PROTIME-INR
INR: 3.1 — ABNORMAL HIGH (ref 0.8–1.2)
Prothrombin Time: 31.3 s — ABNORMAL HIGH (ref 11.4–15.2)

## 2023-02-01 MED ORDER — VITAMIN B-1 100 MG PO TABS: 100.0000 mg | ORAL_TABLET | Freq: Every day | ORAL | 0 refills | Status: DC

## 2023-02-01 MED ORDER — METHOCARBAMOL 500 MG PO TABS: 1000.0000 mg | ORAL_TABLET | Freq: Three times a day (TID) | ORAL | 0 refills | Status: DC | PRN

## 2023-02-01 MED ORDER — ACETAMINOPHEN 500 MG PO TABS: 1000.0000 mg | ORAL_TABLET | Freq: Three times a day (TID) | ORAL | 0 refills | Status: AC | PRN

## 2023-02-01 MED ORDER — DOCUSATE SODIUM 100 MG PO CAPS: 100.0000 mg | ORAL_CAPSULE | Freq: Two times a day (BID) | ORAL | 0 refills | Status: DC | PRN

## 2023-02-01 MED ORDER — ADULT MULTIVITAMIN W/MINERALS CH: 1.0000 | ORAL_TABLET | Freq: Every day | ORAL | Status: AC

## 2023-02-01 MED ORDER — FOLIC ACID 1 MG PO TABS: 1.0000 mg | ORAL_TABLET | Freq: Every day | ORAL | Status: DC

## 2023-02-01 MED ORDER — OXYCODONE HCL 5 MG PO TABS: 2.5000 mg | ORAL_TABLET | Freq: Four times a day (QID) | ORAL | 0 refills | Status: DC | PRN

## 2023-02-01 NOTE — Care Management Important Message (Signed)
Important Message  Patient Details  Name: Raymond Respicio Sr. MRN: [id number] Date of Birth: 04-22-1951   Medicare Important Message Given:  Yes     Hannah Beat 02/01/2023, 2:09 PM

## 2023-02-01 NOTE — Progress Notes (Signed)
Physical Therapy Treatment Patient Details Name: Raymond Gabrys Sr. MRN: [id number] DOB: May 08, 1951 Today's Date: 02/01/2023   History of Present Illness 72 y/o male s/p fall down 3-4 stairs on 01/25/23 landing on brick pavement. Continued to have increasing SOB at home, found to have pneumothorax and fractured right ribs 4-5. Chest tube placed 01/29/23. PMH includes: DDD (degenerative disc disease), lumbar, GERD (gastroesophageal reflux disease), Hyperlipidemia, Hypertension,  Leg DVT (deep venous thromboembolism), chronic, left ([place]) 12/12/2021, Raynaud disease    PT Comments    Pt was received in supine and eager for mobility. Pt was able to perform safe stair trial with up to min guard for safety.  Caregiver was instructed in safe guarding techniques. Pt able to tolerate gait trial with one seated recovery break and cues for upright posture due to R side guarding. Discussed activity pacing and gradually progressing exercise once discharged due to pt inquiring about returning to tennis. Pt continues to benefit from PT services to progress toward functional mobility goals.    Recommendations for follow up therapy are one component of a multi-disciplinary discharge planning process, led by the attending physician.  Recommendations may be updated based on patient status, additional functional criteria and insurance authorization.  Follow Up Recommendations  No PT follow up     Assistance Recommended at Discharge PRN  Patient can return home with the following A little help with walking and/or transfers;A little help with bathing/dressing/bathroom;Assist for transportation;Assistance with cooking/housework;Help with stairs or ramp for entrance   Equipment Recommendations  None recommended by PT    Recommendations for Other Services       Precautions / Restrictions Precautions Precautions: Fall Precaution Comments: right rib fractures 4-5, right side chest tube Restrictions Weight  Bearing Restrictions: No     Mobility  Bed Mobility Overal bed mobility: Modified Independent                  Transfers Overall transfer level: Independent Equipment used: None               General transfer comment: Pt able to stand from low EOB without assist    Ambulation/Gait Ambulation/Gait assistance: Supervision Gait Distance (Feet): 300 Feet Assistive device: None Gait Pattern/deviations: Step-through pattern, Trunk flexed, Antalgic       General Gait Details: Pt demonstrating antalgic gait with slight R lateral lean due to guarding from rib pain. Supervision for safety   Stairs Stairs: Yes Stairs assistance: Supervision, Min guard Stair Management: One rail Left Number of Stairs: 3 (x2) General stair comments: Pt able to demo safe stair technique with initial min guard progressing to supervision. Pt performing step over step up the stairs and step-to down.       Balance Overall balance assessment: Needs assistance Sitting-balance support: Feet supported Sitting balance-Leahy Scale: Normal Sitting balance - Comments: sitting EOB   Standing balance support: No upper extremity supported, During functional activity Standing balance-Leahy Scale: Good                              Cognition Arousal/Alertness: Awake/alert Behavior During Therapy: WFL for tasks assessed/performed Overall Cognitive Status: Within Functional Limits for tasks assessed                                          Exercises      General  Comments General comments (skin integrity, edema, etc.): SpO2 remained above 90% with exertion. Pt instructed in pursed lip breathing.      Pertinent Vitals/Pain Pain Assessment Pain Assessment: 0-10 Pain Score: 3  Pain Location: right ribs (slightly). upper right back Pain Descriptors / Indicators: Discomfort, Sore Pain Intervention(s): Limited activity within patient's tolerance, Monitored during  session, Repositioned     PT Goals (current goals can now be found in the care plan section) Acute Rehab PT Goals Patient Stated Goal: to feel better, return home PT Goal Formulation: With patient Time For Goal Achievement: 02/06/23 Potential to Achieve Goals: Good Progress towards PT goals: Progressing toward goals    Frequency    Min 3X/week      PT Plan Current plan remains appropriate       AM-PAC PT "6 Clicks" Mobility   Outcome Measure  Help needed turning from your back to your side while in a flat bed without using bedrails?: None Help needed moving from lying on your back to sitting on the side of a flat bed without using bedrails?: None Help needed moving to and from a bed to a chair (including a wheelchair)?: A Little Help needed standing up from a chair using your arms (e.g., wheelchair or bedside chair)?: None Help needed to walk in hospital room?: A Little Help needed climbing 3-5 steps with a railing? : A Little 6 Click Score: 21    End of Session Equipment Utilized During Treatment: Gait belt Activity Tolerance: Patient tolerated treatment well Patient left: in chair;with call bell/phone within reach;with family/visitor present Nurse Communication: Mobility status;Patient requests pain meds PT Visit Diagnosis: Other abnormalities of gait and mobility (R26.89);Difficulty in walking, not elsewhere classified (R26.2);Pain     Time: [id number] PT Time Calculation (min) (ACUTE ONLY): 30 min  Charges:  $Gait Training: 23-37 mins                     Michelle Nasuti, PTA Acute Rehabilitation Services Secure Chat Preferred  [phone]    Michelle Nasuti 02/01/2023, 1:47 PM

## 2023-02-01 NOTE — Progress Notes (Signed)
Subjective: CC: Discomfort over R ribs/chest tube. Controlled on scheduled po medications. Tolerating diet without n/v. Voiding. BM yesterday. Working with IS.   Mobilized well with mobility specialist yesterday - 375ft, independent on RA without hypoxia.   AFVSS. No output from chest tube. CXR pending but looks stable on my read. INR still elevated at 3.1.   Objective: Vital signs in last 24 hours: Temp:  [97.6 F (36.4 C)-98.1 F (36.7 C)] 98.1 F (36.7 C) (03/21 0750) Pulse Rate:  [59-72] 72 (03/21 0750) Resp:  [16-17] 17 (03/21 0750) BP: (108-153)/(65-87) 153/87 (03/21 0750) SpO2:  [93 %-97 %] 97 % (03/21 0750) Last BM Date : 01/29/23  Intake/Output from previous day: No intake/output data recorded. Intake/Output this shift: No intake/output data recorded.  PE: Gen:  Alert, NAD, pleasant Card:  Reg Pulm:  CTAB, no W/R/R, effort normal. R chest tube on WS No air leak. Scant drainage in sahara.  Abd: Soft, ND, NT  Ext: No LE edema.  Psych: A&Ox3   Lab Results:  Recent Labs    01/30/23 0849 01/31/23 0055  WBC 8.4 8.3  HGB 15.4 14.1  HCT 47.2 43.9  PLT 242 244    BMET Recent Labs    01/30/23 0849 01/31/23 0055  NA 138 138  K 4.2 3.9  CL 102 105  CO2 26 27  GLUCOSE 93 109*  BUN 22 27*  CREATININE 0.90 0.86  CALCIUM 8.7* 8.4*    PT/INR Recent Labs    01/31/23 0055 02/01/23 0311  LABPROT 43.1* 31.3*  INR 4.6* 3.1*    CMP     Component Value Date/Time   NA 138 01/31/2023 0055   K 3.9 01/31/2023 0055   CL 105 01/31/2023 0055   CO2 27 01/31/2023 0055   GLUCOSE 109 (H) 01/31/2023 0055   BUN 27 (H) 01/31/2023 0055   CREATININE 0.86 01/31/2023 0055   CREATININE 0.88 11/08/2022 1249   CALCIUM 8.4 (L) 01/31/2023 0055   PROT 7.1 01/29/2023 1646   ALBUMIN 3.8 01/29/2023 1646   AST 34 01/29/2023 1646   AST 23 11/08/2022 1249   ALT 18 01/29/2023 1646   ALT 19 11/08/2022 1249   ALKPHOS 97 01/29/2023 1646   BILITOT 1.9 (H) 01/29/2023  1646   BILITOT 0.8 11/08/2022 1249   GFRNONAA >60 01/31/2023 0055   GFRNONAA >60 11/08/2022 1249   Lipase     Component Value Date/Time   LIPASE 43.0 06/29/2022 1413    Studies/Results: DG CHEST PORT 1 VIEW  Result Date: 01/31/2023 CLINICAL DATA:  Pneumothorax EXAM: PORTABLE CHEST 1 VIEW COMPARISON:  01/31/2023, 5:50 a.m. FINDINGS: Stable atelectasis or scarring right base. Tortuous ectatic aorta. Normal pulmonary vasculature. No definite pleural effusion. Right lateral rib fractures again noted. Right-sided chest tube in place. Tiny apical pneumothorax observed previously is not seen on today's study. IMPRESSION: Stable consolidation, scarring or atelectasis at the right base. Right apical pneumothorax identified on the prior study is not seen currently. Electronically Signed   By: Sammie Bench M.D.   On: 01/31/2023 16:23   MR BRAIN W WO CONTRAST  Result Date: 01/31/2023 CLINICAL DATA:  Vascular anomaly. EXAM: MRI HEAD WITHOUT AND WITH CONTRAST TECHNIQUE: Multiplanar, multiecho pulse sequences of the brain and surrounding structures were obtained without and with intravenous contrast. CONTRAST:  7.72mL GADAVIST GADOBUTROL 1 MMOL/ML IV SOLN COMPARISON:  Head CT 01/29/2023.  Head CT 05/06/2022. FINDINGS: Brain: Diffusion imaging does not show any acute or subacute infarction or other  cause of restricted diffusion. Brainstem is normal. 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 Cerebral hemispheres show mild age related volume loss without lobar predominance. No focal insult on the right. On the left, there is not a pattern small-vessel disease or any cortical large vessel infarction. There is a developmental venous anomaly at the left frontoparietal junction affecting the cortical, subcortical and deep brain, with encephalomalacia, gliosis and volume loss. No discrete cavernoma is seen. No sign of aneurysm. No hydrocephalus, mass or extra-axial  collection. Large superficial draining vein. Vascular: Otherwise no abnormal vascular finding. Skull and upper cervical spine: Negative Sinuses/Orbits: Clear/normal Other: None IMPRESSION: 1. No acute or reversible finding. Mild age related volume loss without lobar predominance. 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 2. 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 Regional encephalomalacia and gliosis without evidence of extensive hemosiderin deposition. No evidence of focal cavernoma or aneurysm. No high flow component suspected. Electronically Signed   By: Nelson Chimes M.D.   On: 01/31/2023 12:56   DG CHEST PORT 1 VIEW  Result Date: 01/31/2023 CLINICAL DATA:  Pneumothorax status post chest tube EXAM: PORTABLE CHEST 1 VIEW COMPARISON:  Chest radiograph dated 01/30/2023 FINDINGS: Lines/tubes: Right apicolateral pleural catheter remains. Chest: Persistent bibasilar atelectasis. Pleura: Right pneumothorax remains.  No pleural effusion. Heart/mediastinum: Similar  cardiomediastinal silhouette. Bones: Right rib fractures, as before. IMPRESSION: Right pneumothorax remains. Right apicolateral pleural catheter remains in place. Electronically Signed   By: Darrin Nipper M.D.   On: 01/31/2023 08:18   VAS [place] LOWER EXTREMITY VENOUS (DVT)  Result Date: 01/30/2023  Lower Venous DVT Study Patient Name:  Raymond Bohrer Sr.  Date of Exam:   01/30/2023 Medical Rec #: [id number]                 Accession #:    [id number] Date of Birth: May 25, 1951                  Patient Gender: M Patient Age:   72 years Exam Location:  [facility] Procedure:      VAS [place] LOWER EXTREMITY VENOUS (DVT) Referring Phys: Reather Laurence --------------------------------------------------------------------------------  Indications: Follow up exam.  Risk Factors: DVT LLE 11/06/2021. Anticoagulation: Coumadin  prior to admission. Comparison Study: Previous exam on 08/16/2022 was positive for LLE chronic DVT of                   distal femoral and gastroc veins. Performing Technologist: Rogelia Rohrer RVT, RDMS  Examination Guidelines: A complete evaluation includes B-mode imaging, spectral Doppler, color Doppler, and power Doppler as needed of all accessible portions of each vessel. Bilateral testing is considered an integral part of a complete examination. Limited examinations for reoccurring indications may be performed as noted. The reflux portion of the exam is performed with the patient in reverse Trendelenburg.  +-----+---------------+---------+-----------+----------+--------------+ RIGHTCompressibilityPhasicitySpontaneityPropertiesThrombus Aging +-----+---------------+---------+-----------+----------+--------------+ CFV  Full           Yes      Yes                                 +-----+---------------+---------+-----------+----------+--------------+   +---------+---------------+---------+-----------+----------+--------------+ LEFT     CompressibilityPhasicitySpontaneityPropertiesThrombus Aging +---------+---------------+---------+-----------+----------+--------------+ CFV      Full           Yes  Yes                                 +---------+---------------+---------+-----------+----------+--------------+ SFJ      Full                                                        +---------+---------------+---------+-----------+----------+--------------+ FV Prox  Full           Yes      Yes                                 +---------+---------------+---------+-----------+----------+--------------+ FV Mid   Full           Yes      Yes                                 +---------+---------------+---------+-----------+----------+--------------+ FV DistalPartial        Yes      Yes                  Chronic         +---------+---------------+---------+-----------+----------+--------------+ PFV      Full                                                        +---------+---------------+---------+-----------+----------+--------------+ POP      Full           Yes      Yes                                 +---------+---------------+---------+-----------+----------+--------------+ PTV      Full                                                        +---------+---------------+---------+-----------+----------+--------------+ PERO     Full                                                        +---------+---------------+---------+-----------+----------+--------------+ Gastroc  Full                                                        +---------+---------------+---------+-----------+----------+--------------+     Summary: RIGHT: - No evidence of common femoral vein obstruction.  LEFT: - Findings consistent with chronic deep vein thrombosis involving the left femoral vein. - No cystic structure found in the popliteal fossa.  *See table(s) above for measurements and observations. Electronically  signed by Deitra Mayo MD on 01/30/2023 at 4:42:08 PM.    Final     Anti-infectives: Anti-infectives (From admission, onward)    None        Assessment/Plan 44M s/p GLF 3/14   R PTX - CT placed in ED 3/18. Air leak resolved. On WS. Repeat CXR pending but looks stable. Plan for chest tube removal this am and repeat CXR in 4 hours. IS, pulm toilet. Has been weaned to RA.  R rib fx 4-5 - pain control, IS/pulm toilet. Therapies.  Abrasions R hand - local wound care. No ttp on exam or with rom. If develops pain, consider xray.  H/o LLE DVT - Follows with Hematology for this. Last [place] with chronic thrombus of the distal left femoral and gastrocnemius veins. [place] here with chronic dvt of left femoral vein. On coumadin at baseline. Plan to restart at d/c or this pm if remains inpatient.  Abnormal  CTH/MRI - MRI reviewed with NSGY, Dr. Reatha Armour. No intervention indicated.  Etoh use - CIWA.  Hx HTN - Home meds Hx HLD  DNR FEN - regular diet, SLIV DVT - SCDs, INR 3.1 ID - Tdap given in ED. None currently.  Dispo - Possible discharge this pm if f/u chest xray after chest tube removal is stable.  I reviewed nursing notes, last 24 h vitals and pain scores, last 48 h intake and output, last 24 h labs and trends, and last 24 h imaging results.    LOS: 3 days    Jillyn Ledger , [facility] 02/01/2023, 7:55 AM Please see Amion for pager number during day hours 7:00am-4:30pm

## 2023-02-01 NOTE — Progress Notes (Signed)
   02/01/23 1000  Mobility  Activity Ambulated independently in hallway  Level of Assistance Independent  Assistive Device None  Distance Ambulated (ft) 550 ft  Activity Response Tolerated well  Mobility Referral Yes  $Mobility charge 1 Mobility   Mobility Specialist Progress Note  Pt was in bed and agreeable. Had no c/o pain. Returned to bed w/ all needs met and call bell in reach.  Lucious Groves Mobility Specialist  Please contact via SecureChat or Rehab office at [phone]

## 2023-02-01 NOTE — Discharge Instructions (Addendum)
PNEUMOTHORAX OR HEMOTHORAX +/- RIB FRACTURES  HOME INSTRUCTIONS   PAIN CONTROL:  Pain is best controlled by a usual combination of three different methods TOGETHER:  Ice/Heat Over the counter pain medication Prescription pain medication You may experience some swelling and bruising in area of broken ribs. Ice packs or heating pads (30-60 minutes up to 6 times a day) will help. Use ice for the first few days to help decrease swelling and bruising, then switch to heat to help relax tight/sore spots and speed recovery. Some people prefer to use ice alone, heat alone, alternating between ice & heat. Experiment to what works for you. Swelling and bruising can take several weeks to resolve.  It is helpful to take an over-the-counter pain medication regularly for the first few weeks. Choose one of the following that works best for you:  Naproxen (Aleve, etc) Two 220mg  tabs twice a day Ibuprofen (Advil, etc) Three 200mg  tabs four times a day (every meal & bedtime) Acetaminophen (Tylenol, etc) 500-650mg  four times a day (every meal & bedtime) A prescription for pain medication (such as oxycodone, hydrocodone, etc) may be given to you upon discharge. Take your pain medication as prescribed.  If you are having problems/concerns with the prescription medicine (does not control pain, nausea, vomiting, rash, itching, etc), please call us [phone] to see if we need to switch you to a different pain medicine that will work better for you and/or control your side effect better. If you need a refill on your pain medication, please contact your pharmacy. They will contact our office to request authorization. Prescriptions will not be filled after 5 pm or on week-ends. Avoid getting constipated. When taking pain medications, it is common to experience some constipation. Increasing fluid intake and taking a fiber supplement (such as Metamucil, Citrucel, FiberCon, MiraLax, etc) 1-2 times a day regularly will  usually help prevent this problem from occurring. A mild laxative (prune juice, Milk of Magnesia, MiraLax, etc) should be taken according to package directions if there are no bowel movements after 48 hours.  Watch out for diarrhea. If you have many loose bowel movements, simplify your diet to bland foods & liquids for a few days. Stop any stool softeners and decrease your fiber supplement. Switching to mild anti-diarrheal medications (Kayopectate, Pepto Bismol) can help. If this worsens or does not improve, please call us. Chest tube site wound: you may remove the dressing from your chest tube site 3 days after the removal of your chest tube. DO NOT shower over the dressing. Once   removed, you may shower as normal. Do not submerge your wound in water for 2-3 weeks.  FOLLOW UP  Please call our office to set up or confirm an appointment for follow up for 2 weeks after discharge. You will need to get a chest xray at either [facility] or [facility]. This will be outlined in your follow up instructions. Please call [facility] at [phone] if you have any questions about follow up.  If you have any orthopedic or other injuries you will need to follow up as outlined in your follow up instructions.   WHEN TO CALL us [phone]:  Poor pain control Reactions / problems with new medications (rash/itching, nausea, etc)  Fever over 101.5 F (38.5 C) Worsening swelling or bruising Redness, drainage, pain or swelling around chest tube site Worsening pain, productive cough, difficulty breathing or any other concerning symptoms  The clinic staff is available to answer your questions during  regular business hours (8:30am-5pm). Please don't hesitate to call and ask to speak to one of our nurses for clinical concerns.  If you have a medical emergency, go to the nearest emergency room or call 911.  A surgeon from [facility] is always on call at the hospitals   [facility]   [address], [address], [place], [place] [address] ?  MAIN: [phone] ? TOLL FREE: [phone] ?  FAX [phone]  [email or web address]      Information on Rib Fractures  A rib fracture is a break or crack in one of the bones of the ribs. The ribs are long, curved bones that wrap around your chest and attach to your spine and your breastbone. The ribs protect your heart, lungs, and other organs in the chest. A broken or cracked rib is often painful but is not usually serious. Most rib fractures heal on their own over time. However, rib fractures can be more serious if multiple ribs are broken or if broken ribs move out of place and push against other structures or organs. What are the causes? This condition is caused by: Repetitive movements with high force, such as pitching a baseball or having severe coughing spells. A direct blow to the chest, such as a sports injury, a car accident, or a fall. Cancer that has spread to the bones, which can weaken bones and cause them to break. What are the signs or symptoms? Symptoms of this condition include: Pain when you breathe in or cough. Pain when someone presses on the injured area. Feeling short of breath. How is this diagnosed? This condition is diagnosed with a physical exam and medical history. Imaging tests may also be done, such as: Chest X-ray. CT scan. MRI. Bone scan. Chest ultrasound. How is this treated? Treatment for this condition depends on the severity of the fracture. Most rib fractures usually heal on their own in 1-3 months. Sometimes healing takes longer if there is a cough that does not stop or if there are other activities that make the injury worse (aggravating factors). While you heal, you will be given medicines to control the pain. You will also be taught deep breathing exercises. Severe injuries may require hospitalization or surgery. Follow these instructions at home: Managing pain,  stiffness, and swelling If directed, apply ice to the injured area. Put ice in a plastic bag. Place a towel between your skin and the bag. Leave the ice on for 20 minutes, 2-3 times a day. Take over-the-counter and prescription medicines only as told by your health care provider. Activity Avoid a lot of activity and any activities or movements that cause pain. Be careful during activities and avoid bumping the injured rib. Slowly increase your activity as told by your health care provider. General instructions Do deep breathing exercises as told by your health care provider. This helps prevent pneumonia, which is a common complication of a broken rib. Your health care provider may instruct you to: Take deep breaths several times a day. Try to cough several times a day, holding a pillow against the injured area. Use a device called incentive spirometer to practice deep breathing several times a day. Drink enough fluid to keep your urine pale yellow. Do not wear a rib belt or binder. These restrict breathing, which can lead to pneumonia. Keep all follow-up visits as told by your health care provider. This is important. Contact a health care provider if: You have a fever. Get   help right away if: You have difficulty breathing or you are short of breath. You develop a cough that does not stop, or you cough up thick or bloody sputum. You have nausea, vomiting, or pain in your abdomen. Your pain gets worse and medicine does not help. Summary A rib fracture is a break or crack in one of the bones of the ribs. A broken or cracked rib is often painful but is not usually serious. Most rib fractures heal on their own over time. Treatment for this condition depends on the severity of the fracture. Avoid a lot of activity and any activities or movements that cause pain. This information is not intended to replace advice given to you by your health care provider. Make sure you discuss any questions  you have with your health care provider. Document Released: 10/30/2005 Document Revised: 01/29/2017 Document Reviewed: 01/29/2017 Elsevier Interactive Patient Education  2019 [employer]    Pneumothorax A pneumothorax is commonly called a collapsed lung. It is a condition in which air leaks from a lung and builds up between the thin layer of tissue that covers the lungs (visceral pleura) and the interior wall of the chest cavity (parietal pleura). The air gets trapped outside the lung, between the lung and the chest wall (pleural space). The air takes up space and prevents the lung from fully expanding. This condition sometimes occurs suddenly with no apparent cause. The buildup of air may be small or large. A small pneumothorax may go away on its own. A large pneumothorax will require treatment and hospitalization. What are the causes? This condition may be caused by: Trauma and injury to the chest wall. Surgery and other medical procedures. A complication of an underlying lung problem, especially chronic obstructive pulmonary disease (COPD) or emphysema. Sometimes the cause of this condition is not known. What increases the risk? You are more likely to develop this condition if: You have an underlying lung problem. You smoke. You are 20-40 years old, male, tall, and underweight. You have a personal or family history of pneumothorax. You have an eating disorder (anorexia nervosa). This condition can also happen quickly, even in people with no history of lung problems. What are the signs or symptoms? Sometimes a pneumothorax will have no symptoms. When symptoms are present, they can include: Chest pain. Shortness of breath. Increased rate of breathing. Bluish color to your lips or skin (cyanosis). How is this diagnosed? This condition may be diagnosed by: A medical history and physical exam. A chest X-ray, chest CT scan, or ultrasound. How is this treated? Treatment depends on  how severe your condition is. The goal of treatment is to remove the extra air and allow your lung to expand back to its normal size. For a small pneumothorax: No treatment may be needed. Extra oxygen is sometimes used to make it go away more quickly. For a large pneumothorax or a pneumothorax that is causing symptoms, a procedure is done to drain the air from your lungs. To do this, a health care provider may use: A needle with a syringe. This is used to suck air from a pleural space where no additional leakage is taking place. A chest tube. This is used to suck air where there is ongoing leakage into the pleural space. The chest tube may need to remain in place for several days until the air leak has healed. In more severe cases, surgery may be needed to repair the damage that is causing the leak. If you   have multiple pneumothorax episodes or have an air leak that will not heal, a procedure called a pleurodesis may be done. A medicine is placed in the pleural space to irritate the tissues around the lung so that the lung will stick to the chest wall, seal any leaks, and stop any buildup of air in that space. If you have an underlying lung problem, severe symptoms, or a large pneumothorax you will usually need to stay in the hospital. Follow these instructions at home: Lifestyle Do not use any products that contain nicotine or tobacco, such as cigarettes and e-cigarettes. These are major risk factors in pneumothorax. If you need help quitting, ask your health care provider. Do not lift anything that is heavier than 10 lb (4.5 kg), or the limit that your health care provider tells you, until he or she says that it is safe. Avoid activities that take a lot of effort (strenuous) for as long as told by your health care provider. Return to your normal activities as told by your health care provider. Ask your health care provider what activities are safe for you. Do not fly in an airplane or scuba dive  until your health care provider says it is okay. General instructions Take over-the-counter and prescription medicines only as told by your health care provider. If a cough or pain makes it difficult for you to sleep at night, try sleeping in a semi-upright position in a recliner or by using 2 or 3 pillows. If you had a chest tube and it was removed, ask your health care provider when you can remove the bandage (dressing). While the dressing is in place, do not allow it to get wet. Keep all follow-up visits as told by your health care provider. This is important. Contact a health care provider if: You cough up thick mucus (sputum) that is yellow or green in color. You were treated with a chest tube, and you have redness, increasing pain, or discharge at the site where it was placed. Get help right away if: You have increasing chest pain or shortness of breath. You have a cough that will not go away. You begin coughing up blood. You have pain that is getting worse or is not controlled with medicines. The site where your chest tube was located opens up. You feel air coming out of the site where the chest tube was placed. You have a fever or persistent symptoms for more than 2-3 days. You have a fever and your symptoms suddenly get worse. These symptoms may represent a serious problem that is an emergency. Do not wait to see if the symptoms will go away. Get medical help right away. Call your local emergency services (911 in the [place] Do not drive yourself to the hospital. Summary A pneumothorax, commonly called a collapsed lung, is a condition in which air leaks from a lung and gets trapped between the lung and the chest wall (pleural space). The buildup of air may be small or large. A small pneumothorax may go away on its own. A large pneumothorax will require treatment and hospitalization. Treatment for this condition depends on how severe the pneumothorax is. The goal of treatment is to  remove the extra air and allow the lung to expand back to its normal size. This information is not intended to replace advice given to you by your health care provider. Make sure you discuss any questions you have with your health care provider. Document Released: 10/30/2005 Document Revised: 10/08/2017 Document  Reviewed: 10/08/2017 Elsevier Interactive Patient Education  2019 [employer].   To note Your CT head on admission showed  Hyperdense focus in the posterior left frontal lobe, adjacent to the left lateral ventricle, with mild surrounding hypodensity, possibly edema. 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  This was discussed with Neurology who recommended an MRI. 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 Regional encephalomalacia and gliosis without evidence of extensive hemosiderin deposition. No evidence of focal cavernoma or aneurysm. No high flow component suspected.  We reviewed these results with Neurosurgery who recommended no intervention.

## 2023-02-01 NOTE — TOC Transition Note (Signed)
Transition of Care [facility]) - CM/SW Discharge Note   Patient Details  Name: Raymond Frix Sr. MRN: [id number] Date of Birth: 04-04-1951  [facility] CM/SW Contact:  Ella Bodo, RN Phone Number: 02/01/2023, 10:15am  Clinical Narrative:    Patient medically stable for dc home today with significant other to provide needed assistance.  PCP is Dr. Jodi Mourning, and patient has an upcoming annual physical in April.  PT/OT recommending no OP follow up or DME. Patient for likely discharge later today; anticipate no dc needs.    Final next level of care: Home/Self Care Barriers to Discharge: Barriers Resolved            Discharge Plan and Services Additional resources added to the After Visit Summary for     Discharge Planning Services: CM Consult                                 Social Determinants of Health (SDOH) Interventions SDOH Screenings   Food Insecurity: No Food Insecurity (01/29/2023)  Housing: Low Risk  (01/29/2023)  Transportation Needs: No Transportation Needs (01/30/2023)  Utilities: Not At Risk (01/30/2023)  Alcohol Screen: Low Risk  (06/01/2022)  Depression (PHQ2-9): Low Risk  (06/29/2022)  Recent Concern: Depression (PHQ2-9) - Medium Risk (06/01/2022)  Financial Resource Strain: Medium Risk (06/01/2022)  Physical Activity: Sufficiently Active (06/01/2022)  Social Connections: Moderately Isolated (06/01/2022)  Stress: Stress Concern Present (06/01/2022)  Tobacco Use: Unknown (01/29/2023)     Readmission Risk Interventions     No data to display         Reinaldo Raddle, RN, BSN  Trauma/Neuro ICU Case Manager [phone]

## 2023-02-01 NOTE — Discharge Summary (Signed)
Patient ID: Raymond Vannote Sr. [id number] Jul 24, 1951 72 y.o.  Admit date: 01/29/2023 Discharge date: 02/01/2023  Admitting Diagnosis: GLF  R PTX  R rib fx 4-5  Abrasions R hand H/o DVT  Discharge Diagnosis GLF  R PTX  R rib fx 4-5  Abrasions R hand  H/o DVT Abnormal CTH/MRI   Consultants Neruology NSGY  Reason for Admission: 74M s/p fall down 3-4 stairs on 01/25/23 landing on brick pavement. Reports landing "face first", but denies hitting his head and denies LOC. Denies dizziness or lightheadedness before falling, but also does not remember tripping on anything. Has been short of breath, went to UC and was sent to [facility]. Sees Dr. Marin Olp for DVT in LLE 10/2021, for which he is on Coumadin . Single episode of DVT.   Procedures Dr. Bobbye Morton - R chest tube placement - 01/29/2023  Hospital Course:  Patient presented as above. He was found to have R rib fx's 4-5 with R PTX. Chest tube was placed by Dr. Bobbye Morton on 3/18. Serial chest xrays were monitored and once chest output decreased and pneumothorax improved the chest tube was removed.   Patient worked with therapies and was cleared for no f/u. On 3/21, the patient was voiding well, tolerating diet, ambulating well, pain well controlled, vital signs stable and felt stable for discharge home. Discussed discharge instructions, restrictions and return/call back precautions. Follow up as noted below.   To note, CTH on admission without acute injury. It did show possible small cavernous malformation with associated developmental venous anomaly. Discussed with Neuro. Recommended MRI with and without contrast. This was done and reviewed with NSGY, Dr. Reatha Armour, who recommended no intervention. I discussed this with the patient.   Allergies as of 02/01/2023       Reactions   Penicillins Hives, Rash      Penicillin V Potassium Other (See Comments)        Medication List     STOP taking these medications    naproxen sodium 220  MG tablet Commonly known as: ALEVE       TAKE these medications    acetaminophen 500 MG tablet Commonly known as: TYLENOL Take 2 tablets (1,000 mg total) by mouth every 8 (eight) hours as needed.   atorvastatin 10 MG tablet Commonly known as: LIPITOR Take 1 tablet (10 mg total) by mouth daily.   cholecalciferol 25 MCG (1000 UNIT) tablet Commonly known as: VITAMIN D3 Take 1,000 Units by mouth daily.   docusate sodium 100 MG capsule Commonly known as: COLACE Take 1 capsule (100 mg total) by mouth 2 (two) times daily as needed for mild constipation.   folic acid 1 MG tablet Commonly known as: FOLVITE Take 1 tablet (1 mg total) by mouth daily. Start taking on: February 02, 2023   gabapentin 100 MG capsule Commonly known as: NEURONTIN Take 2 capsules (200 mg total) by mouth at bedtime.   hydrochlorothiazide 12.5 MG capsule Commonly known as: MICROZIDE TAKE ONE CAPSULE BY MOUTH ONE TIME DAILY   losartan 50 MG tablet Commonly known as: COZAAR TAKE ONE TABLET BY MOUTH ONE TIME DAILY   methocarbamol 500 MG tablet Commonly known as: ROBAXIN Take 2 tablets (1,000 mg total) by mouth every 8 (eight) hours as needed for muscle spasms.   multivitamin with minerals Tabs tablet Take 1 tablet by mouth daily. Start taking on: February 02, 2023   oxyCODONE 5 MG immediate release tablet Commonly known as: Oxy IR/ROXICODONE Take 0.5-1 tablets (2.5-5 mg total)  by mouth every 6 (six) hours as needed for breakthrough pain.   thiamine 100 MG tablet Commonly known as: Vitamin B-1 Take 1 tablet (100 mg total) by mouth daily. Start taking on: February 02, 2023   warfarin 5 MG tablet Commonly known as: COUMADIN 11/27/2022 Please take Coumadin 10mg  by mouth for two days in a row, on the 3rd day take 5mg . Repeat this schedule until next lab check. What changed:  how much to take how to take this when to take this additional instructions          Follow-up [place],  [place], [place]. Schedule an appointment as soon as possible for a visit.   Specialty: Internal Medicine Why: Primary Care MD.Follow up in April for annual physical Contact information: [place] [address] [place] Follow up.   Why: As needed Contact information: [place] [phone] [place]. Go on 02/15/2023.   Why: For follow up chest xray Contact information: [place] [id number]                 Signed: Alferd Apa, [facility] 02/01/2023, 3:23 PM Please see Amion for pager number during day hours 7:00am-4:30pm

## 2023-02-02 ENCOUNTER — Encounter: Payer: Self-pay | Admitting: *Deleted

## 2023-02-02 ENCOUNTER — Telehealth: Payer: Self-pay | Admitting: *Deleted

## 2023-02-02 NOTE — Transitions of Care (Post Inpatient/ED Visit) (Signed)
   02/02/2023  Name: Raymond Gruenwald Sr. MRN: [id number] DOB: [birth year]  Today's TOC FU Call Status: Today's TOC FU Call Status:: Unsuccessul Call (1st Attempt) Unsuccessful Call (1st Attempt) Date: 02/02/23  Attempted to reach the patient regarding the most recent Inpatient visit; left HIPAA compliant voice message requesting call back  Follow Up Plan: Additional outreach attempts will be made to reach the patient to complete the Transitions of Care (Post Inpatient visit) call.   Oneta Rack, RN, BSN, CCRN Alumnus RN CM Care Coordination/ Transition of [place] Management [phone]: direct office

## 2023-02-05 ENCOUNTER — Encounter: Payer: Self-pay | Admitting: *Deleted

## 2023-02-05 ENCOUNTER — Ambulatory Visit: Payer: Self-pay | Admitting: *Deleted

## 2023-02-05 ENCOUNTER — Telehealth: Payer: Self-pay | Admitting: *Deleted

## 2023-02-05 NOTE — Transitions of Care (Post Inpatient/ED Visit) (Signed)
   02/05/2023  Name: Raymond Schackmann Sr. MRN: [id number] DOB: 1951/10/24  Today's TOC FU Call Status: Today's TOC FU Call Status:: Unsuccessful Call (2nd Attempt) Unsuccessful Call (2nd Attempt) Date: 02/05/23  Attempted to reach the patient regarding the most recent Inpatient visit; left HIPAA compliant voice message requesting call back  Follow Up Plan: Additional outreach attempts will be made to reach the patient to complete the Transitions of Care (Post Inpatient visit) call.   Oneta Rack, RN, BSN, CCRN Alumnus RN CM Care Coordination/ Transition of [place] Management [phone]: direct office

## 2023-02-05 NOTE — Patient Outreach (Signed)
  Care Coordination   Post- TOC Call attempt documentation  Visit Note   02/05/2023 Name: Raymond Kalkman Sr. MRN: [id number] DOB: September 13, 1951  Raymond Like Sr. is a 72 y.o. year old male who sees Valere Dross, Marvis Repress, FNP for primary care. I  received voice message from patient after leaving him a voice message earlier today-- patient tells me he is seeing his PCP tomorrow for scheduled hospital follow up visit; in his message, he requests no further outreaches for TOC  be made; further calls deferred according to patient request  SDOH assessments and interventions completed:  No  Care Coordination Interventions:  No, not indicated   Follow up plan: No further intervention required.   Encounter Outcome:  Pt. Refused   Oneta Rack, RN, BSN, CCRN Alumnus RN CM Care Coordination/ Transition of [place] Management [phone]: direct office

## 2023-02-06 ENCOUNTER — Ambulatory Visit (INDEPENDENT_AMBULATORY_CARE_PROVIDER_SITE_OTHER): Payer: Medicare Other | Admitting: Family Medicine

## 2023-02-06 ENCOUNTER — Encounter: Payer: Self-pay | Admitting: Family Medicine

## 2023-02-06 VITALS — BP 128/77 | HR 87 | Resp 18 | Ht 71.0 in | Wt 180.0 lb

## 2023-02-06 DIAGNOSIS — S270XXD Traumatic pneumothorax, subsequent encounter: Secondary | ICD-10-CM | POA: Diagnosis not present

## 2023-02-06 DIAGNOSIS — S270XXA Traumatic pneumothorax, initial encounter: Secondary | ICD-10-CM

## 2023-02-06 DIAGNOSIS — Z09 Encounter for follow-up examination after completed treatment for conditions other than malignant neoplasm: Secondary | ICD-10-CM

## 2023-02-06 NOTE — Progress Notes (Signed)
Acute Office Visit  Subjective:     Patient ID: Raymond Lamia Sr., male    DOB: [birth year], 72 y.o.   MRN: [id number]  Chief Complaint  Patient presents with   [facility] follow up     HPI Patient is in today for hospital follow-up.   Patient went to the ED on 01/29/23 after falling on brick pavement 4 days prior, landing on some brick stairs with his right side. Pain and breathing progressively worsened and he went to UC who sent him to the ED. He is on Coumadin for DVT in 2022, following with Dr. Marin Olp. Denied hitting his head when he fell. CT head with possible small cavernous anomaly and MRI w/wo contrast completed with no recommended intervention per neurology consult.  He was found to right rib fractures with a right-sided pneumothorax and received a chest tube on 01/29/23. He improved and chest tube was removed. He was discharged home on 02/01/23.   CT chest abd pelvis w contrast: Right lateral 4th and 5th rib fractures with large right pneumothorax and collapse/atelectasis of the right lung. No acute findings or evidence of significant traumatic injury in the abdomen or pelvis.  CT head: Hyperdense focus in the posterior left frontal lobe, adjacent to the left lateral ventricle, with mild surrounding hypodensity, possibly edema. 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 MRI with and without contrast is recommended for further evaluation.  MRI brain w/wo contrast: 1. No acute or reversible finding. Mild age related volume loss without lobar predominance. 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 2. 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 Regional encephalomalacia and gliosis without evidence of extensive hemosiderin deposition. No evidence  of focal cavernoma or aneurysm. No high flow component suspected.   Today he reports he is doing significantly better overall. States he is still sore, but improving each day. He has been trying to take it easy, but he did use his push mower for about 10 minutes yesterday and has been walking his dogs with no new problems. States he removed the chest tube bandage last night and site looks fine, just bruising. Reports his neighbor is a Marine scientist and has been checking on him regularly.  Patient denies any chest pain, palpitations, dyspnea at rest, wheezing, edema, recurrent headaches, vision changes.   Appt wit Lottie Dawson, NP hematology on Friday to recheck INR.   ROS All review of systems negative except what is listed in the HPI      Objective:    BP 128/77 (BP Location: Left Arm, Patient Position: Sitting, Cuff Size: Normal)   Pulse 87   Resp 18   Ht 5\' 11"  (1.803 m)   Wt 180 lb (81.6 kg)   SpO2 95%   BMI 25.10 kg/m    Physical Exam Vitals reviewed.  Constitutional:      Appearance: Normal appearance.  HENT:     Head: Normocephalic and atraumatic.  Cardiovascular:     Rate and Rhythm: Normal rate and regular rhythm.     Pulses: Normal pulses.  Pulmonary:     Effort: Pulmonary effort is normal. No respiratory distress.     Breath sounds: Normal breath sounds. No stridor. No wheezing, rhonchi or rales.     Comments: Right lateral chest with healing bruises and scabbed abrasions. Chest tube insertion site closed,  no drainage or surrounding erythema, edema Chest:     Chest wall: No tenderness.  Musculoskeletal:     Cervical back: Normal range of motion and neck supple.  Skin:    Comments: Right middle fingertip with mild discoloration, improving every day per patient, history of raynaud's, warm, normal sensation  Neurological:     General: No focal deficit present.     Mental Status: He is alert and oriented to person, place, and time. Mental status is at baseline.   Psychiatric:        Mood and Affect: Mood normal.        Behavior: Behavior normal.        Thought Content: Thought content normal.        Judgment: Judgment normal.     No results found for any visits on 02/06/23.      Assessment & Plan:   1. Traumatic pneumothorax, initial encounter 2. Hospital discharge follow-up Significant improvement. Chest tube site looks good, no signs of infection. Scattered abrasions are healing nicely. No new concerns today. States he does not have any needs at home. Recommend he continue to take it easy, being mindful of his breathing. Lungs sound good today. Keep INR appointment / hematology follow-up for this Friday.  Patient aware of signs/symptoms requiring further/urgent evaluation.     No orders of the defined types were placed in this encounter.   Return for - as scheduled or sooner if needed.  Terrilyn Saver, NP

## 2023-02-06 NOTE — Patient Instructions (Signed)
It was good seeing you today! I'm so glad your recovery is going well! No changes today.  Please keep your upcoming appointments and let us know if you need anything before then.

## 2023-02-07 ENCOUNTER — Inpatient Hospital Stay: Payer: Medicare Other

## 2023-02-07 ENCOUNTER — Inpatient Hospital Stay: Payer: Medicare Other | Admitting: Family

## 2023-02-09 ENCOUNTER — Inpatient Hospital Stay: Payer: Medicare Other | Attending: Family

## 2023-02-09 ENCOUNTER — Inpatient Hospital Stay (HOSPITAL_BASED_OUTPATIENT_CLINIC_OR_DEPARTMENT_OTHER): Payer: Medicare Other | Admitting: Family

## 2023-02-09 ENCOUNTER — Other Ambulatory Visit: Payer: Self-pay

## 2023-02-09 ENCOUNTER — Encounter: Payer: Self-pay | Admitting: Family

## 2023-02-09 VITALS — BP 116/70 | HR 79 | Temp 97.6°F | Resp 16 | Ht 71.0 in | Wt 179.0 lb

## 2023-02-09 DIAGNOSIS — Z86718 Personal history of other venous thrombosis and embolism: Secondary | ICD-10-CM

## 2023-02-09 DIAGNOSIS — I82402 Acute embolism and thrombosis of unspecified deep veins of left lower extremity: Secondary | ICD-10-CM

## 2023-02-09 DIAGNOSIS — I82412 Acute embolism and thrombosis of left femoral vein: Secondary | ICD-10-CM | POA: Insufficient documentation

## 2023-02-09 DIAGNOSIS — I82502 Chronic embolism and thrombosis of unspecified deep veins of left lower extremity: Secondary | ICD-10-CM | POA: Diagnosis not present

## 2023-02-09 DIAGNOSIS — Z7901 Long term (current) use of anticoagulants: Secondary | ICD-10-CM | POA: Insufficient documentation

## 2023-02-09 LAB — CMP (CANCER CENTER ONLY)
ALT: 17 U/L (ref 0–44)
AST: 19 U/L (ref 15–41)
Albumin: 4.4 g/dL (ref 3.5–5.0)
Alkaline Phosphatase: 131 U/L — ABNORMAL HIGH (ref 38–126)
Anion gap: 9 (ref 5–15)
BUN: 17 mg/dL (ref 8–23)
CO2: 30 mmol/L (ref 22–32)
Calcium: 9.3 mg/dL (ref 8.9–10.3)
Chloride: 101 mmol/L (ref 98–111)
Creatinine: 0.9 mg/dL (ref 0.61–1.24)
GFR, Estimated: >60 mL/min (ref >60)
Glucose, Bld: 94 mg/dL (ref 70–99)
Potassium: 4.3 mmol/L (ref 3.5–5.1)
Sodium: 140 mmol/L (ref 135–145)
Total Bilirubin: 0.6 mg/dL (ref 0.3–1.2)
Total Protein: 7.3 g/dL (ref 6.5–8.1)

## 2023-02-09 LAB — CBC WITH DIFFERENTIAL (CANCER CENTER ONLY)
Abs Immature Granulocytes: 0.03 10*3/uL (ref 0.00–0.07)
Basophils Absolute: 0.1 10*3/uL (ref 0.0–0.1)
Basophils Relative: 1 %
Eosinophils Absolute: 0.4 10*3/uL (ref 0.0–0.5)
Eosinophils Relative: 4 %
HCT: 45.4 % (ref 39.0–52.0)
Hemoglobin: 15.2 g/dL (ref 13.0–17.0)
Immature Granulocytes: 0 %
Lymphocytes Relative: 31 %
Lymphs Abs: 2.7 10*3/uL (ref 0.7–4.0)
MCH: 32.5 pg (ref 26.0–34.0)
MCHC: 33.5 g/dL (ref 30.0–36.0)
MCV: 97 fL (ref 80.0–100.0)
Monocytes Absolute: 0.7 10*3/uL (ref 0.1–1.0)
Monocytes Relative: 8 %
Neutro Abs: 4.8 10*3/uL (ref 1.7–7.7)
Neutrophils Relative %: 56 %
Platelet Count: 328 10*3/uL (ref 150–400)
RBC: 4.68 MIL/uL (ref 4.22–5.81)
RDW: 12.6 % (ref 11.5–15.5)
WBC Count: 8.7 10*3/uL (ref 4.0–10.5)
nRBC: 0 % (ref 0.0–0.2)

## 2023-02-09 LAB — PROTIME-INR
INR: 2.4 — ABNORMAL HIGH (ref 0.8–1.2)
Prothrombin Time: 25.6 seconds — ABNORMAL HIGH (ref 11.4–15.2)

## 2023-02-09 LAB — LACTATE DEHYDROGENASE: LDH: 196 U/L — ABNORMAL HIGH (ref 98–192)

## 2023-02-09 NOTE — Progress Notes (Signed)
Hematology and Oncology Follow Up Visit  Raymond Segner Sr. ID:[id number] Nov 11, 1951 72 y.o. 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 He is recuperating from a bad fall 2 weeks ago. He ended up with fractures to the right 4th and 5th ribs as well as a pneumothorax on that side.  He is still sore but healing nicely. He is looking forward to being cleared to start playing tennis again in a few months.  He had some bruising from the fall but thankfully did not note any blood loss. No petechiae.  No fever, chills, n/v, cough, rash, dizziness, SOB, chest pain, palpitations, abdominal pain or changes in bowel or bladder habits.  No swelling, numbness or tingling in his extremities.  No new falls, no syncope.  Appetite and hydration are good. Weight is stable at 179 lbs.   ECOG Performance Status: 1 - Symptomatic but completely ambulatory  Medications:  Allergies as of 02/09/2023       Reactions   Penicillins Hives, Rash      Penicillin V Potassium Other (See Comments)        Medication List        Accurate as of February 09, 2023  2:39 PM. If you have any questions, ask your nurse or doctor.          acetaminophen 500 MG tablet Commonly known as: TYLENOL Take 2 tablets (1,000 mg total) by mouth every 8 (eight) hours as needed.   atorvastatin 10 MG tablet Commonly known as: LIPITOR Take 1 tablet (10 mg total) by mouth daily.   cholecalciferol 25 MCG (1000 UNIT) tablet Commonly known as: VITAMIN D3 Take 1,000 Units by mouth daily.   docusate sodium 100 MG capsule Commonly known as: COLACE Take 1 capsule (100 mg total) by mouth 2 (two) times daily as needed for mild constipation.   folic acid 1 MG tablet Commonly known  as: FOLVITE Take 1 tablet (1 mg total) by mouth daily.   gabapentin 100 MG capsule Commonly known as: NEURONTIN Take 2 capsules (200 mg total) by mouth at bedtime.   hydrochlorothiazide 12.5 MG capsule Commonly known as: MICROZIDE TAKE ONE CAPSULE BY MOUTH ONE TIME DAILY   losartan 50 MG tablet Commonly known as: COZAAR TAKE ONE TABLET BY MOUTH ONE TIME DAILY   methocarbamol 500 MG tablet Commonly known as: ROBAXIN Take 2 tablets (1,000 mg total) by mouth every 8 (eight) hours as needed for muscle spasms.   multivitamin with minerals Tabs tablet Take 1 tablet by mouth daily.   oxyCODONE 5 MG immediate release tablet Commonly known as: Oxy IR/ROXICODONE Take 0.5-1 tablets (2.5-5 mg total) by mouth every 6 (six) hours as needed for breakthrough pain.   thiamine 100 MG tablet Commonly known as: Vitamin B-1 Take 1 tablet (100 mg total) by mouth daily.   warfarin 5 MG tablet Commonly known as: COUMADIN 11/27/2022 Please take Coumadin 10mg  by mouth for two days in a row, on the 3rd day take 5mg . Repeat this schedule until next lab check. What changed:  how much to take how to take this when to take this additional instructions        Allergies:  Allergies  Allergen Reactions  Penicillins Hives and Rash        Penicillin V Potassium Other (See Comments)    Past Medical History, Surgical history, Social history, and Family History were reviewed and updated.  Review of Systems: All other 10 point review of systems is negative.   Physical Exam:  vitals were not taken for this visit.   Wt Readings from Last 3 Encounters:  02/06/23 180 lb (81.6 kg)  01/29/23 176 lb (79.8 kg)  11/22/22 179 lb (81.2 kg)    Ocular: Sclerae unicteric, pupils equal, round and reactive to light Ear-nose-throat: Oropharynx clear, dentition fair Lymphatic: No cervical or supraclavicular adenopathy Lungs no rales or rhonchi, good excursion bilaterally Heart regular rate and rhythm,  no murmur appreciated Abd soft, nontender, positive bowel sounds MSK no focal spinal tenderness, no joint edema Neuro: non-focal, well-oriented, appropriate affect Breasts: Deferred   Lab Results  Component Value Date   WBC 8.3 01/31/2023   HGB 14.1 01/31/2023   HCT 43.9 01/31/2023   MCV 98.4 01/31/2023   PLT 244 01/31/2023   No results found for: "FERRITIN", "IRON", "TIBC", "UIBC", "IRONPCTSAT" Lab Results  Component Value Date   RBC 4.46 01/31/2023   No results found for: "KPAFRELGTCHN", "LAMBDASER", "KAPLAMBRATIO" No results found for: "IGGSERUM", "IGA", "IGMSERUM" No results found for: "TOTALPROTELP", "ALBUMINELP", "A1GS", "A2GS", "BETS", "BETA2SER", "GAMS", "MSPIKE", "SPEI"   Chemistry      Component Value Date/Time   NA 138 01/31/2023 0055   K 3.9 01/31/2023 0055   CL 105 01/31/2023 0055   CO2 27 01/31/2023 0055   BUN 27 (H) 01/31/2023 0055   CREATININE 0.86 01/31/2023 0055   CREATININE 0.88 11/08/2022 1249      Component Value Date/Time   CALCIUM 8.4 (L) 01/31/2023 0055   ALKPHOS 97 01/29/2023 1646   AST 34 01/29/2023 1646   AST 23 11/08/2022 1249   ALT 18 01/29/2023 1646   ALT 19 11/08/2022 1249   BILITOT 1.9 (H) 01/29/2023 1646   BILITOT 0.8 11/08/2022 1249       Impression and Plan: Raymond [name] is a very pleasant 72 yo gentleman with left lower extremity DVT felt to be idiopathic.  He will continue his same regimen with Coumadin, INR 2.4.  We will recheck his INR every 3 weeks per MD.  Follow-up in12 weeks.   Lottie Dawson, NP 3/29/20242:39 PM

## 2023-02-16 NOTE — Progress Notes (Unsigned)
[facility] [address] [place] [address] Phone: [phone] Subjective:   [name], am serving as a scribe for Dr. Antoine Primas.  I'm seeing this patient by the request  of:  Olive Bass, FNP  CC: Back pain follow-up  LSL:[id number]  11/22/2022 Has responded to.   Therapy previously, having more radicular symptoms.  Will have patient try gabapentin again which did make some improvement previously.  Patient knows of side effects.  Will try 200 mg at night.  Continue the home exercises.  Follow-up with me again in 2 to 3 months.  Total time with patient 31 minutes     Could be causing some of the leg pain.  Has been followed by vascular as well as oncology.  Is on Coumadin at this time.      Degenerative arthritis of the knee noted. Discussed icing regimen and home exercises, discussed which activities to do and which ones to avoid. Increase activity slowly. Increase activity slowly. Discussed if any more instability will need to consider bracing. Hold on any injection at the moment. Follow-up again in 2 to 3 months   Update 02/19/2023 Raymond Atcitty Sr. is a 72 y.o. male coming in with complaint of L knee and LBP. Patient states that he fell 3 weeks ago and thought he cracked some ribs on his right side as well as his head. Did not feel like he had any head injury symptoms. Went to UC and had to go by ambulance to hospital due to SOB and found to have punctured lunge. Continues to be winded especially with walking up hill. Plans on playing tennis tomorrow. No longer on gabapentin but is going to start back on it.   Back pain continues to not being able to move as much.  Patient was looking forward to trying to increase activity in the near future.  Patient describes the pain as a dull, throbbing aching pain.    Past Medical History:  Diagnosis Date   DDD (degenerative disc disease), lumbar    DVT (deep venous thrombosis)  (HCC)    GERD (gastroesophageal reflux disease)    Hyperlipidemia    Hypertension    Leg DVT (deep venous thromboembolism), chronic, left (HCC) 12/12/2021   Raynaud disease    Past Surgical History:  Procedure Laterality Date   EYE SURGERY     Social History   Socioeconomic History   Marital status: Widowed    Spouse name: Not on file   Number of children: Not on file   Years of education: Not on file   Highest education level: Professional school degree (e.g., MD, DDS, DVM, JD)  Occupational History   Not on file  Tobacco Use   Smoking status: Never   Smokeless tobacco: Not on file  Vaping Use   Vaping Use: Never used  Substance and Sexual Activity   Alcohol use: Not Currently   Drug use: Not Currently   Sexual activity: Not Currently  Other Topics Concern   Not on file  Social History Narrative   Not on file   Social Determinants of Health   Financial Resource Strain: Medium Risk (02/02/2023)   Overall Financial Resource Strain (CARDIA)    Difficulty of Paying Living Expenses: Somewhat hard  Food Insecurity: No Food Insecurity (02/02/2023)   Hunger Vital Sign    Worried About Running Out of Food in the Last Year: Never true    Ran Out of Food in the  Last Year: Never true  Transportation Needs: No Transportation Needs (02/02/2023)   PRAPARE - Administrator, Civil ServiceTransportation    Lack of Transportation (Medical): No    Lack of Transportation (Non-Medical): No  Physical Activity: Sufficiently Active (02/02/2023)   Exercise Vital Sign    Days of Exercise per Week: 4 days    Minutes of Exercise per Session: 70 min  Stress: No Stress Concern Present (02/02/2023)   [employer] - Occupational Stress Questionnaire    Feeling of Stress : Not at all  Social Connections: Moderately Isolated (02/02/2023)   Social Connection and Isolation Panel [NHANES]    Frequency of Communication with Friends and Family: Three times a week    Frequency of Social Gatherings with  Friends and Family: Three times a week    Attends Religious Services: Never    Active Member of Clubs or Organizations: Yes    Attends BankerClub or Organization Meetings: More than 4 times per year    Marital Status: Widowed   Allergies  Allergen Reactions   Penicillins Hives and Rash        Penicillin V Potassium Other (See Comments)   Family History  Problem Relation Age of Onset   Alcohol abuse Mother    Arthritis Mother    Diabetes Mother    Mental retardation Mother    Alcohol abuse Father    Hypertension Father    Cancer Sister    Depression Sister    Cancer Sister    Arthritis Maternal Grandmother    Parkinson's disease Maternal Grandfather      Current Outpatient Medications (Cardiovascular):    atorvastatin (LIPITOR) 10 MG tablet, Take 1 tablet (10 mg total) by mouth daily.   hydrochlorothiazide (MICROZIDE) 12.5 MG capsule, TAKE ONE CAPSULE BY MOUTH ONE TIME DAILY   losartan (COZAAR) 50 MG tablet, TAKE ONE TABLET BY MOUTH ONE TIME DAILY   Current Outpatient Medications (Analgesics):    acetaminophen (TYLENOL) 500 MG tablet, Take 2 tablets (1,000 mg total) by mouth every 8 (eight) hours as needed.   oxyCODONE (OXY IR/ROXICODONE) 5 MG immediate release tablet, Take 0.5-1 tablets (2.5-5 mg total) by mouth every 6 (six) hours as needed for breakthrough pain.  Current Outpatient Medications (Hematological):    folic acid (FOLVITE) 1 MG tablet, Take 1 tablet (1 mg total) by mouth daily.   warfarin (COUMADIN) 5 MG tablet, 11/27/2022 Please take Coumadin 10mg  by mouth for two days in a row, on the 3rd day take 5mg . Repeat this schedule until next lab check. (Patient taking differently: Take 5-10 mg by mouth daily. Take 5mg  by mouth for two days. On 3rd day, take 10mg . Then repeat.)  Current Outpatient Medications (Other):    cholecalciferol (VITAMIN D3) 25 MCG (1000 UNIT) tablet, Take 1,000 Units by mouth daily.   docusate sodium (COLACE) 100 MG capsule, Take 1 capsule (100  mg total) by mouth 2 (two) times daily as needed for mild constipation.   gabapentin (NEURONTIN) 100 MG capsule, Take 2 capsules (200 mg total) by mouth at bedtime.   methocarbamol (ROBAXIN) 500 MG tablet, Take 2 tablets (1,000 mg total) by mouth every 8 (eight) hours as needed for muscle spasms.   Multiple Vitamin (MULTIVITAMIN WITH MINERALS) TABS tablet, Take 1 tablet by mouth daily.   thiamine (VITAMIN B-1) 100 MG tablet, Take 1 tablet (100 mg total) by mouth daily.   Reviewed prior external information including notes and imaging from  primary care provider As well as notes that were available  from care everywhere and other healthcare systems.  We did review patient's most recent admission.  Past medical history, social, surgical and family history all reviewed in electronic medical record.  No pertanent information unless stated regarding to the chief complaint.   Review of Systems:  No headache, visual changes, nausea, vomiting, diarrhea, constipation, dizziness, abdominal pain, skin rash, fevers, chills, night sweats, weight loss, swollen lymph nodes, body aches, joint swelling, chest pain, shortness of breath, mood changes. POSITIVE muscle aches  Objective  Blood pressure (!) 130/90, pulse 64, height 5\' 11"  (1.803 m), weight 186 lb (84.4 kg), SpO2 96 %.   General: No apparent distress alert and oriented x3 mood and affect normal, dressed appropriately.  HEENT: Pupils equal, extraocular movements intact  Respiratory: Patient's speak in full sentences and does not appear short of breath  Cardiovascular: No lower extremity edema, non tender, no erythema  Low back does have some loss of lordosis.  Patient is still tender to palpation over the right side of the rib angles.  Seems to be more posterior.  But also over the lateral aspect. Tender over the rib angles on the right side mostly over the fourth and fifth.  The patient is able to take a deep breath.   Impression and  Recommendations:     The above documentation has been reviewed and is accurate and complete Judi SaaZachary M Lisaanne Lawrie, DO

## 2023-02-19 ENCOUNTER — Ambulatory Visit (INDEPENDENT_AMBULATORY_CARE_PROVIDER_SITE_OTHER): Payer: Medicare Other | Admitting: Family Medicine

## 2023-02-19 ENCOUNTER — Ambulatory Visit (INDEPENDENT_AMBULATORY_CARE_PROVIDER_SITE_OTHER): Payer: Medicare Other

## 2023-02-19 VITALS — BP 130/90 | HR 64 | Ht 71.0 in | Wt 186.0 lb

## 2023-02-19 DIAGNOSIS — M5136 Other intervertebral disc degeneration, lumbar region: Secondary | ICD-10-CM | POA: Diagnosis not present

## 2023-02-19 DIAGNOSIS — R0602 Shortness of breath: Secondary | ICD-10-CM | POA: Diagnosis not present

## 2023-02-19 DIAGNOSIS — J90 Pleural effusion, not elsewhere classified: Secondary | ICD-10-CM | POA: Diagnosis not present

## 2023-02-19 DIAGNOSIS — N400 Enlarged prostate without lower urinary tract symptoms: Secondary | ICD-10-CM

## 2023-02-19 DIAGNOSIS — S2231XA Fracture of one rib, right side, initial encounter for closed fracture: Secondary | ICD-10-CM | POA: Diagnosis not present

## 2023-02-19 NOTE — Patient Instructions (Signed)
Vit D 2000IU Read about PRP Exercises  Restart Gabapentin See me again in 3 months

## 2023-02-19 NOTE — Assessment & Plan Note (Addendum)
Chronic problem but did take a step back secondary to patient with most recent pneumothorax, does have the rib fractures as well.  I do think that patient can start to slowly increase activity.  Given some home exercises that I think will be beneficial.  Patient wants to hold on anything such as physical therapy at the moment.  Increase activity slowly.  Will follow-up again in 6 to 8 weeks.  Total time reviewing patient's notes from his inpatient as well as reviewing the imaging as well as discussing with patient 31 minutes

## 2023-02-19 NOTE — Assessment & Plan Note (Signed)
Encourage patient to follow-up, patient still has not followed up with urology at this time

## 2023-02-20 ENCOUNTER — Telehealth: Payer: Self-pay | Admitting: Family

## 2023-02-20 NOTE — Telephone Encounter (Signed)
LVM to advise pt to call back regarding his appointment. We need to shift him down to 11:20 am to relieve the number of physicals and new pt appts back to back on 4/12.

## 2023-02-23 ENCOUNTER — Encounter: Payer: Self-pay | Admitting: Family

## 2023-02-23 ENCOUNTER — Ambulatory Visit: Payer: Medicare Other | Admitting: Family

## 2023-02-23 ENCOUNTER — Ambulatory Visit (INDEPENDENT_AMBULATORY_CARE_PROVIDER_SITE_OTHER): Payer: Medicare Other | Admitting: Family

## 2023-02-23 VITALS — BP 114/82 | HR 73 | Resp 19 | Ht 71.0 in | Wt 181.4 lb

## 2023-02-23 DIAGNOSIS — E785 Hyperlipidemia, unspecified: Secondary | ICD-10-CM

## 2023-02-23 DIAGNOSIS — N401 Enlarged prostate with lower urinary tract symptoms: Secondary | ICD-10-CM | POA: Diagnosis not present

## 2023-02-23 DIAGNOSIS — M5030 Other cervical disc degeneration, unspecified cervical region: Secondary | ICD-10-CM | POA: Diagnosis not present

## 2023-02-23 DIAGNOSIS — I82502 Chronic embolism and thrombosis of unspecified deep veins of left lower extremity: Secondary | ICD-10-CM | POA: Diagnosis not present

## 2023-02-23 DIAGNOSIS — I10 Essential (primary) hypertension: Secondary | ICD-10-CM | POA: Diagnosis not present

## 2023-02-23 DIAGNOSIS — R3911 Hesitancy of micturition: Secondary | ICD-10-CM

## 2023-02-23 DIAGNOSIS — F4321 Adjustment disorder with depressed mood: Secondary | ICD-10-CM

## 2023-02-23 NOTE — Progress Notes (Signed)
Raymond Dikes Sr. is a 72 y.o. male with the following history as recorded in [employer]:  Patient Active Problem List   Diagnosis Date Noted   Traumatic pneumothorax, initial encounter 01/29/2023   Pain of right lower extremity due to ischemia 05/01/2022   Leg DVT (deep venous thromboembolism), chronic, left 12/12/2021   Mixed hyperlipidemia 11/21/2021   Low libido 11/21/2021   Low back pain 11/21/2021   Loss of appetite 11/21/2021   Hypertensive retinopathy 11/21/2021   Essential hypertension 11/21/2021   Chronic pain 11/21/2021   Enlarged prostate 06/20/2021   Degenerative disc disease, cervical 12/03/2020   Degenerative lumbar disc 09/27/2020   Degenerative arthritis of left knee 09/27/2020    Current Outpatient Medications  Medication Sig Dispense Refill   acetaminophen (TYLENOL) 500 MG tablet Take 2 tablets (1,000 mg total) by mouth every 8 (eight) hours as needed. 30 tablet 0   atorvastatin (LIPITOR) 10 MG tablet Take 1 tablet (10 mg total) by mouth daily. 90 tablet 0   cholecalciferol (VITAMIN D3) 25 MCG (1000 UNIT) tablet Take 1,000 Units by mouth daily.     docusate sodium (COLACE) 100 MG capsule Take 1 capsule (100 mg total) by mouth 2 (two) times daily as needed for mild constipation. 10 capsule 0   folic acid (FOLVITE) 1 MG tablet Take 1 tablet (1 mg total) by mouth daily.     gabapentin (NEURONTIN) 100 MG capsule Take 2 capsules (200 mg total) by mouth at bedtime. 180 capsule 0   hydrochlorothiazide (MICROZIDE) 12.5 MG capsule TAKE ONE CAPSULE BY MOUTH ONE TIME DAILY 90 capsule 1   losartan (COZAAR) 50 MG tablet TAKE ONE TABLET BY MOUTH ONE TIME DAILY 90 tablet 1   Multiple Vitamin (MULTIVITAMIN WITH MINERALS) TABS tablet Take 1 tablet by mouth daily.     thiamine (VITAMIN B-1) 100 MG tablet Take 1 tablet (100 mg total) by mouth daily. 30 tablet 0   warfarin (COUMADIN) 5 MG tablet 11/27/2022 Please take Coumadin  by mouth for two days in a row, on the 3rd day  take . Repeat this schedule until next lab check. (Patient taking differently: Take 5-10 mg by mouth daily. Take  by mouth for two days. On 3rd day, take . Then repeat.) 60 tablet 2   methocarbamol (ROBAXIN) 500 MG tablet Take 2 tablets (1,000 mg total) by mouth every 8 (eight) hours as needed for muscle spasms. (Patient not taking: Reported on 02/23/2023) 60 tablet 0   oxyCODONE (OXY IR/ROXICODONE) 5 MG immediate release tablet Take 0.5-1 tablets (2.5-5 mg total) by mouth every 6 (six) hours as needed for breakthrough pain. (Patient not taking: Reported on 02/23/2023) 12 tablet 0   No current facility-administered medications for this visit.    Allergies: Penicillins and Penicillin v potassium  Past Medical History:  Diagnosis Date   DDD (degenerative disc disease), lumbar    DVT (deep venous thrombosis)    GERD (gastroesophageal reflux disease)    Hyperlipidemia    Hypertension    Leg DVT (deep venous thromboembolism), chronic, left 12/12/2021   Raynaud disease     Past Surgical History:  Procedure Laterality Date   EYE SURGERY      Family History  Problem Relation Age of Onset   Alcohol abuse Mother    Arthritis Mother    Diabetes Mother    Mental retardation Mother    Alcohol abuse Father    Hypertension Father    Cancer Sister    Depression Sister  Cancer Sister    Arthritis Maternal Grandmother    Parkinson's disease Maternal Grandfather     Social History   Tobacco Use   Smoking status: Never   Smokeless tobacco: Not on file  Substance Use Topics   Alcohol use: Not Currently    Subjective:   Follow up on chronic care needs; would like to get referral to urology as has been discussed previously; continuing to work with sports medicine and hematology regularly;  Defers labs today- prefers to have updated with urology; Did recently trip and fall in his back yard- resulted in pneumothorax but feel that overall doing very well; repeat CXR was done earlier  this week; Still struggling with grief from loss of wife- "it's better but I am still stuck." Notes considering to start therapy and has contacts he will set up for himself;   Objective:  Vitals:   02/23/23 1113  BP: 114/82  Pulse: 73  Resp: 19  SpO2: 96%  Weight: 181 lb 6.4 oz (82.3 kg)  Height: 5\' 11"  (1.803 m)    General: Well developed, well nourished, in no acute distress  Skin : Warm and dry.  Head: Normocephalic and atraumatic  Eyes: Sclera and conjunctiva clear; pupils round and reactive to light; extraocular movements intact  Ears: External normal; canals clear; tympanic membranes normal  Oropharynx: Pink, supple. No suspicious lesions  Neck: Supple without thyromegaly, adenopathy  Lungs: Respirations unlabored; clear to auscultation bilaterally without wheeze, rales, rhonchi  CVS exam: normal rate and regular rhythm.  Neurologic: Alert and oriented; speech intact; face symmetrical; moves all extremities well; CNII-XII intact without focal deficit   Assessment:  1. Benign prostatic hyperplasia with urinary hesitancy   2. Primary hypertension   3. Hyperlipidemia, unspecified hyperlipidemia type   4. Degenerative disc disease, cervical   5. Leg DVT (deep venous thromboembolism), chronic, left   6. Grief reaction     Plan:  Urology referral updated; he wants labs to be done with urology; Stable; continue same medications; Stable; continue same medications; Continue with sports medicine as scheduled; On Comadin- continue to follow up regularly for management; Patient is considering to start therapy- he notes he does not need a referral to therapist at this time but will call back if he changes his mind.   Time spent 30 minutes  Return for Needs to schedule for AWV- can be done over the phone.  Orders Placed This Encounter  Procedures   Ambulatory referral to Urology    Referral Priority:   Routine    Referral Type:   Consultation    Referral Reason:   Specialty  Services Required    Requested Specialty:   Urology    Number of Visits Requested:   1    Requested Prescriptions    No prescriptions requested or ordered in this encounter

## 2023-03-02 ENCOUNTER — Inpatient Hospital Stay: Payer: Medicare Other

## 2023-03-05 ENCOUNTER — Telehealth: Payer: Self-pay | Admitting: *Deleted

## 2023-03-05 ENCOUNTER — Inpatient Hospital Stay: Payer: Medicare Other | Attending: Family

## 2023-03-05 DIAGNOSIS — I82412 Acute embolism and thrombosis of left femoral vein: Secondary | ICD-10-CM | POA: Insufficient documentation

## 2023-03-05 DIAGNOSIS — I82502 Chronic embolism and thrombosis of unspecified deep veins of left lower extremity: Secondary | ICD-10-CM

## 2023-03-05 DIAGNOSIS — I82402 Acute embolism and thrombosis of unspecified deep veins of left lower extremity: Secondary | ICD-10-CM

## 2023-03-05 DIAGNOSIS — Z86718 Personal history of other venous thrombosis and embolism: Secondary | ICD-10-CM

## 2023-03-05 LAB — CBC WITH DIFFERENTIAL (CANCER CENTER ONLY)
Abs Immature Granulocytes: 0.02 10*3/uL (ref 0.00–0.07)
Basophils Absolute: 0.1 10*3/uL (ref 0.0–0.1)
Basophils Relative: 1 %
Eosinophils Absolute: 0.4 10*3/uL (ref 0.0–0.5)
Eosinophils Relative: 5 %
HCT: 46.2 % (ref 39.0–52.0)
Hemoglobin: 15.1 g/dL (ref 13.0–17.0)
Immature Granulocytes: 0 %
Lymphocytes Relative: 30 %
Lymphs Abs: 2.6 10*3/uL (ref 0.7–4.0)
MCH: 32.1 pg (ref 26.0–34.0)
MCHC: 32.7 g/dL (ref 30.0–36.0)
MCV: 98.3 fL (ref 80.0–100.0)
Monocytes Absolute: 0.7 10*3/uL (ref 0.1–1.0)
Monocytes Relative: 8 %
Neutro Abs: 5 10*3/uL (ref 1.7–7.7)
Neutrophils Relative %: 56 %
Platelet Count: 214 10*3/uL (ref 150–400)
RBC: 4.7 MIL/uL (ref 4.22–5.81)
RDW: 13 % (ref 11.5–15.5)
WBC Count: 8.7 10*3/uL (ref 4.0–10.5)
nRBC: 0 % (ref 0.0–0.2)

## 2023-03-05 LAB — PROTIME-INR
INR: 3.6 — ABNORMAL HIGH (ref 0.8–1.2)
Prothrombin Time: 35.9 seconds — ABNORMAL HIGH (ref 11.4–15.2)

## 2023-03-05 NOTE — Telephone Encounter (Signed)
-----   Message from Erenest Blank, NP sent at 03/05/2023 12:45 PM EDT ----- INR stable, no changes!   ----- Message ----- From: Interface, Lab In [place] Sent: 03/05/2023  11:38 AM EDT To: Erenest Blank, NP

## 2023-03-09 ENCOUNTER — Encounter: Payer: Self-pay | Admitting: Urology

## 2023-03-09 ENCOUNTER — Ambulatory Visit: Payer: Medicare Other | Admitting: Family

## 2023-03-09 ENCOUNTER — Ambulatory Visit (INDEPENDENT_AMBULATORY_CARE_PROVIDER_SITE_OTHER): Payer: Medicare Other | Admitting: Urology

## 2023-03-09 VITALS — BP 133/79 | HR 81 | Ht 71.0 in | Wt 182.6 lb

## 2023-03-09 DIAGNOSIS — N138 Other obstructive and reflux uropathy: Secondary | ICD-10-CM | POA: Diagnosis not present

## 2023-03-09 DIAGNOSIS — N529 Male erectile dysfunction, unspecified: Secondary | ICD-10-CM | POA: Insufficient documentation

## 2023-03-09 DIAGNOSIS — N401 Enlarged prostate with lower urinary tract symptoms: Secondary | ICD-10-CM | POA: Diagnosis not present

## 2023-03-09 LAB — URINALYSIS, ROUTINE W REFLEX MICROSCOPIC
Bilirubin, UA: NEGATIVE
Glucose, UA: NEGATIVE
Ketones, UA: NEGATIVE
Leukocytes,UA: NEGATIVE
Nitrite, UA: NEGATIVE
Protein,UA: NEGATIVE
RBC, UA: NEGATIVE
Specific Gravity, UA: 1.03 (ref 1.005–1.030)
Urobilinogen, Ur: 0.2 mg/dL (ref 0.2–1.0)
pH, UA: 5.5 (ref 5.0–7.5)

## 2023-03-09 LAB — BLADDER SCAN AMB NON-IMAGING

## 2023-03-09 MED ORDER — TADALAFIL 5 MG PO TABS: 5.0000 mg | ORAL_TABLET | Freq: Every day | ORAL | 11 refills | Status: DC

## 2023-03-09 NOTE — Progress Notes (Signed)
Assessment: 1. BPH with obstruction/lower urinary tract symptoms   2. Organic impotence    Plan: I personally reviewed the patient's chart including provider notes, and lab results. Options for management of BPH with lower urinary tract symptoms discussed including observation, medical therapy, minimally invasive procedures, and surgical management. Options for management of erectile dysfunction discussed. Trial of tadalafil 5 mg daily for management of his BPH and ED.  Prescription provided.  Use and side effects discussed. Return to office in 1 month.   Chief Complaint:  Chief Complaint  Patient presents with   Benign Prostatic Hypertrophy    History of Present Illness:  Raymond Bachar Sr. is a 72 y.o. male who is seen in consultation from Olive Bass, FNP for evaluation of BPH with LUTS and erectile dysfunction.   He had has a history of BPH with some very mild lower urinary tract symptoms for a number of years.  He was previously followed by Dr. Janice Norrie in [place].  He has not been on any medical therapy for his BPH symptoms.  He has noted some gradual worsening of his symptoms with frequent urination in the morning with a sensation of incomplete emptying, decreased stream, and nocturia 1-2 times at night.  No dysuria or gross hematuria.  No history of UTIs or prostatitis. IPSS = 14 with QOL 4/6 today.  PSA from 7/23: 3.64  He also reports difficulty achieving an adequate erection for intercourse.  He is a widower and had not been sexually active for period of >1-year.  He currently has a partner and is having difficulty achieving an adequate erection for intercourse.  No pain or curvature.  He has some slight decrease in his libido.  He has not tried any medical therapy for erectile dysfunction.  He does report a prior history of decreased sensation with ejaculation.   Past Medical History:  Past Medical History:  Diagnosis Date   DDD (degenerative disc  disease), lumbar    DVT (deep venous thrombosis) (HCC)    GERD (gastroesophageal reflux disease)    Hyperlipidemia    Hypertension    Leg DVT (deep venous thromboembolism), chronic, left (HCC) 12/12/2021   Raynaud disease     Past Surgical History:  Past Surgical History:  Procedure Laterality Date   EYE SURGERY      Allergies:  Allergies  Allergen Reactions   Penicillins Hives and Rash        Penicillin V Potassium Other (See Comments)    Family History:  Family History  Problem Relation Age of Onset   Alcohol abuse Mother    Arthritis Mother    Diabetes Mother    Mental retardation Mother    Alcohol abuse Father    Hypertension Father    Cancer Sister    Depression Sister    Cancer Sister    Arthritis Maternal Grandmother    Parkinson's disease Maternal Grandfather     Social History:  Social History   Tobacco Use   Smoking status: Never  Vaping Use   Vaping Use: Never used  Substance Use Topics   Alcohol use: Not Currently   Drug use: Not Currently    Review of symptoms:  Constitutional:  Negative for unexplained weight loss, night sweats, fever, chills ENT:  Negative for nose bleeds, sinus pain, painful swallowing CV:  Negative for chest pain, shortness of breath, exercise intolerance, palpitations, loss of consciousness Resp:  Negative for cough, wheezing, shortness of breath GI:  Negative for nausea,  vomiting, diarrhea, bloody stools GU:  Positives noted in HPI; otherwise negative for gross hematuria, dysuria, urinary incontinence Neuro:  Negative for seizures, poor balance, limb weakness, slurred speech Psych:  Negative for lack of energy, depression, anxiety Endocrine:  Negative for polydipsia, polyuria, symptoms of hypoglycemia (dizziness, hunger, sweating) Hematologic:  Negative for anemia, purpura, petechia, prolonged or excessive bleeding, use of anticoagulants  Allergic:  Negative for difficulty breathing or choking as a result of exposure  to anything; no shellfish allergy; no allergic response (rash/itch) to materials, foods  Physical exam: BP 133/79   Pulse 81   Ht 5\' 11"  (1.803 m)   Wt 182 lb 9.6 oz (82.8 kg)   BMI 25.47 kg/m  GENERAL APPEARANCE:  Well appearing, well developed, well nourished, NAD HEENT: Atraumatic, Normocephalic, oropharynx clear. NECK: Supple without lymphadenopathy or thyromegaly. LUNGS: Clear to auscultation bilaterally. HEART: Regular Rate and Rhythm without murmurs, gallops, or rubs. ABDOMEN: Soft, non-tender, No Masses. EXTREMITIES: Moves all extremities well.  Without clubbing, cyanosis, or edema. NEUROLOGIC:  Alert and oriented x 3, normal gait, CN II-XII grossly intact.  MENTAL STATUS:  Appropriate. BACK:  Non-tender to palpation.  No CVAT SKIN:  Warm, dry and intact.   GU: Penis:  circumcised Meatus: Normal Scrotum: normal, no masses Testis: normal without masses bilateral Epididymis: normal Prostate: 50 g, NT, no nodules Rectum: Normal tone,  no masses or tenderness   Results: U/A: 1+ bilirubin  PVR: 64 ml

## 2023-03-14 DIAGNOSIS — D1801 Hemangioma of skin and subcutaneous tissue: Secondary | ICD-10-CM | POA: Diagnosis not present

## 2023-03-14 DIAGNOSIS — L821 Other seborrheic keratosis: Secondary | ICD-10-CM | POA: Diagnosis not present

## 2023-03-14 DIAGNOSIS — C44212 Basal cell carcinoma of skin of right ear and external auricular canal: Secondary | ICD-10-CM | POA: Diagnosis not present

## 2023-03-14 DIAGNOSIS — D044 Carcinoma in situ of skin of scalp and neck: Secondary | ICD-10-CM | POA: Diagnosis not present

## 2023-03-14 DIAGNOSIS — L570 Actinic keratosis: Secondary | ICD-10-CM | POA: Diagnosis not present

## 2023-03-14 DIAGNOSIS — Z85828 Personal history of other malignant neoplasm of skin: Secondary | ICD-10-CM | POA: Diagnosis not present

## 2023-03-14 DIAGNOSIS — L814 Other melanin hyperpigmentation: Secondary | ICD-10-CM | POA: Diagnosis not present

## 2023-03-14 DIAGNOSIS — C44329 Squamous cell carcinoma of skin of other parts of face: Secondary | ICD-10-CM | POA: Diagnosis not present

## 2023-03-20 ENCOUNTER — Other Ambulatory Visit: Payer: Self-pay

## 2023-03-20 DIAGNOSIS — I82502 Chronic embolism and thrombosis of unspecified deep veins of left lower extremity: Secondary | ICD-10-CM

## 2023-03-20 MED ORDER — WARFARIN SODIUM 5 MG PO TABS: 5.0000 mg | ORAL_TABLET | Freq: Every day | ORAL | 3 refills | Status: DC

## 2023-04-11 ENCOUNTER — Encounter: Payer: Self-pay | Admitting: Urology

## 2023-04-11 ENCOUNTER — Ambulatory Visit (INDEPENDENT_AMBULATORY_CARE_PROVIDER_SITE_OTHER): Payer: Medicare Other | Admitting: Urology

## 2023-04-11 VITALS — BP 134/80 | HR 70 | Ht 71.0 in | Wt 179.0 lb

## 2023-04-11 DIAGNOSIS — N138 Other obstructive and reflux uropathy: Secondary | ICD-10-CM

## 2023-04-11 DIAGNOSIS — N529 Male erectile dysfunction, unspecified: Secondary | ICD-10-CM

## 2023-04-11 DIAGNOSIS — N401 Enlarged prostate with lower urinary tract symptoms: Secondary | ICD-10-CM

## 2023-04-11 DIAGNOSIS — R3129 Other microscopic hematuria: Secondary | ICD-10-CM

## 2023-04-11 LAB — URINALYSIS, ROUTINE W REFLEX MICROSCOPIC
Bilirubin, UA: NEGATIVE
Glucose, UA: NEGATIVE
Ketones, UA: NEGATIVE
Leukocytes,UA: NEGATIVE
Nitrite, UA: NEGATIVE
Protein,UA: NEGATIVE
Specific Gravity, UA: 1.025 (ref 1.005–1.030)
Urobilinogen, Ur: 0.2 mg/dL (ref 0.2–1.0)
pH, UA: 6.5 (ref 5.0–7.5)

## 2023-04-11 LAB — MICROSCOPIC EXAMINATION
Cast Type: NONE SEEN
Casts: NONE SEEN /lpf
Crystal Type: NONE SEEN
Crystals: NONE SEEN
Epithelial Cells (non renal): NONE SEEN /hpf (ref 0–10)
Renal Epithel, UA: NONE SEEN /hpf
Trichomonas, UA: NONE SEEN
WBC, UA: NONE SEEN /hpf (ref 0–5)
Yeast, UA: NONE SEEN

## 2023-04-11 LAB — BLADDER SCAN AMB NON-IMAGING

## 2023-04-11 NOTE — Progress Notes (Signed)
Assessment: 1. BPH with obstruction/lower urinary tract symptoms   2. Organic impotence   3. Microscopic hematuria     Plan: I discussed options for management of his BPH with lower urinary tract symptoms including a trial of an alpha-blocker.  He would like to continue on the daily tadalafil at the present time. I also discussed options for management of his erectile dysfunction with as needed medications. I discussed the findings of microscopic hematuria on his urinalysis today. Will reevaluate at his next visit. Continue daily tadalafil 5 mg. Return to office in 1 month.  Chief Complaint:  Chief Complaint  Patient presents with   Benign Prostatic Hypertrophy    History of Present Illness:  Raymond Lamberg Sr. is a 72 y.o. male who is seen for continued evaluation of BPH with LUTS and erectile dysfunction.   He had has a history of BPH with some very mild lower urinary tract symptoms for a number of years.  He was previously followed by Dr. Janice Norrie in [place], [place].  He had not been on any medical therapy for his BPH symptoms.  He noted some gradual worsening of his symptoms with frequent urination in the morning with a sensation of incomplete emptying, decreased stream, and nocturia 1-2 times at night.  No dysuria or gross hematuria.  No history of UTIs or prostatitis. IPSS = 14 with QOL 4/6. PVR = 64 ml  PSA from 7/23: 3.64  He also reported difficulty achieving an adequate erection for intercourse.  He is a widower and had not been sexually active for period of >1-year.  He currently has a partner and is having difficulty achieving an adequate erection for intercourse.  No pain or curvature.  He has some slight decrease in his libido.  He had not tried any medical therapy for erectile dysfunction.  He does report a prior history of decreased sensation with ejaculation.  He was given a trial of tadalafil 5 mg daily at his visit in 4/24.  He returns today for follow-up.   He continues on tadalafil 5 mg daily.  He has noted some slight improvement in his urinary symptoms.  He voids with a good stream.  He continues with nocturia x 1.  He also continues to have some frequent urination and sensation of incomplete emptying primarily in the morning.  No dysuria or gross hematuria. IPSS = 13 today. He has not seen any change in his erectile dysfunction with the daily tadalafil.  However, he has not tried to engage in sexual activity recently  Portions of the above documentation were copied from a prior visit for review purposes only.   Past Medical History:  Past Medical History:  Diagnosis Date   DDD (degenerative disc disease), lumbar    DVT (deep venous thrombosis) (HCC)    GERD (gastroesophageal reflux disease)    Hyperlipidemia    Hypertension    Leg DVT (deep venous thromboembolism), chronic, left (HCC) 12/12/2021   Raynaud disease     Past Surgical History:  Past Surgical History:  Procedure Laterality Date   EYE SURGERY      Allergies:  Allergies  Allergen Reactions   Penicillins Hives and Rash        Penicillin V Potassium Other (See Comments)    Family History:  Family History  Problem Relation Age of Onset   Alcohol abuse Mother    Arthritis Mother    Diabetes Mother    Mental retardation Mother    Alcohol abuse Father  Hypertension Father    Cancer Sister    Depression Sister    Cancer Sister    Arthritis Maternal Grandmother    Parkinson's disease Maternal Grandfather     Social History:  Social History   Tobacco Use   Smoking status: Never  Vaping Use   Vaping Use: Never used  Substance Use Topics   Alcohol use: Not Currently   Drug use: Not Currently    ROS: Constitutional:  Negative for fever, chills, weight loss CV: Negative for chest pain, previous MI, hypertension Respiratory:  Negative for shortness of breath, wheezing, sleep apnea, frequent cough GI:  Negative for nausea, vomiting, bloody stool,  GERD  Physical exam: BP 134/80   Pulse 70   Ht 5\' 11"  (1.803 m)   Wt 179 lb (81.2 kg)   BMI 24.97 kg/m  GENERAL APPEARANCE:  Well appearing, well developed, well nourished, NAD HEENT:  Atraumatic, normocephalic, oropharynx clear NECK:  Supple without lymphadenopathy or thyromegaly ABDOMEN:  Soft, non-tender, no masses EXTREMITIES:  Moves all extremities well, without clubbing, cyanosis, or edema NEUROLOGIC:  Alert and oriented x 3, normal gait, CN II-XII grossly intact MENTAL STATUS:  appropriate BACK:  Non-tender to palpation, No CVAT SKIN:  Warm, dry, and intact   Results: U/A: 3-10 RBC  PVR = 16 ml

## 2023-04-17 DIAGNOSIS — Z85828 Personal history of other malignant neoplasm of skin: Secondary | ICD-10-CM | POA: Diagnosis not present

## 2023-04-17 DIAGNOSIS — C44329 Squamous cell carcinoma of skin of other parts of face: Secondary | ICD-10-CM | POA: Diagnosis not present

## 2023-04-24 DIAGNOSIS — Z85828 Personal history of other malignant neoplasm of skin: Secondary | ICD-10-CM | POA: Diagnosis not present

## 2023-04-24 DIAGNOSIS — C44212 Basal cell carcinoma of skin of right ear and external auricular canal: Secondary | ICD-10-CM | POA: Diagnosis not present

## 2023-05-04 ENCOUNTER — Encounter: Payer: Self-pay | Admitting: Family

## 2023-05-04 ENCOUNTER — Inpatient Hospital Stay (HOSPITAL_BASED_OUTPATIENT_CLINIC_OR_DEPARTMENT_OTHER): Payer: Medicare Other | Admitting: Family

## 2023-05-04 ENCOUNTER — Telehealth: Payer: Self-pay | Admitting: *Deleted

## 2023-05-04 ENCOUNTER — Inpatient Hospital Stay: Payer: Medicare Other | Attending: Family

## 2023-05-04 ENCOUNTER — Ambulatory Visit (INDEPENDENT_AMBULATORY_CARE_PROVIDER_SITE_OTHER): Payer: Medicare Other | Admitting: Family

## 2023-05-04 VITALS — BP 136/78 | HR 75 | Ht 71.0 in | Wt 180.0 lb

## 2023-05-04 VITALS — HR 77 | Temp 97.9°F | Resp 19 | Ht 71.0 in | Wt 180.1 lb

## 2023-05-04 DIAGNOSIS — Z7902 Long term (current) use of antithrombotics/antiplatelets: Secondary | ICD-10-CM | POA: Diagnosis not present

## 2023-05-04 DIAGNOSIS — Z86718 Personal history of other venous thrombosis and embolism: Secondary | ICD-10-CM

## 2023-05-04 DIAGNOSIS — L239 Allergic contact dermatitis, unspecified cause: Secondary | ICD-10-CM | POA: Diagnosis not present

## 2023-05-04 DIAGNOSIS — I82502 Chronic embolism and thrombosis of unspecified deep veins of left lower extremity: Secondary | ICD-10-CM

## 2023-05-04 DIAGNOSIS — I82402 Acute embolism and thrombosis of unspecified deep veins of left lower extremity: Secondary | ICD-10-CM

## 2023-05-04 LAB — CMP (CANCER CENTER ONLY)
ALT: 14 U/L (ref 0–44)
AST: 25 U/L (ref 15–41)
Albumin: 4.1 g/dL (ref 3.5–5.0)
Alkaline Phosphatase: 100 U/L (ref 38–126)
Anion gap: 9 (ref 5–15)
BUN: 21 mg/dL (ref 8–23)
CO2: 25 mmol/L (ref 22–32)
Calcium: 9.2 mg/dL (ref 8.9–10.3)
Chloride: 105 mmol/L (ref 98–111)
Creatinine: 0.94 mg/dL (ref 0.61–1.24)
GFR, Estimated: >60 mL/min (ref >60)
Glucose, Bld: 83 mg/dL (ref 70–99)
Potassium: 4 mmol/L (ref 3.5–5.1)
Sodium: 139 mmol/L (ref 135–145)
Total Bilirubin: 0.8 mg/dL (ref 0.3–1.2)
Total Protein: 6.8 g/dL (ref 6.5–8.1)

## 2023-05-04 LAB — CBC WITH DIFFERENTIAL (CANCER CENTER ONLY)
Abs Immature Granulocytes: 0.03 10*3/uL (ref 0.00–0.07)
Basophils Absolute: 0.1 10*3/uL (ref 0.0–0.1)
Basophils Relative: 1 %
Eosinophils Absolute: 0.3 10*3/uL (ref 0.0–0.5)
Eosinophils Relative: 3 %
HCT: 44.6 % (ref 39.0–52.0)
Hemoglobin: 14.6 g/dL (ref 13.0–17.0)
Immature Granulocytes: 0 %
Lymphocytes Relative: 24 %
Lymphs Abs: 2 10*3/uL (ref 0.7–4.0)
MCH: 31.7 pg (ref 26.0–34.0)
MCHC: 32.7 g/dL (ref 30.0–36.0)
MCV: 96.7 fL (ref 80.0–100.0)
Monocytes Absolute: 0.7 10*3/uL (ref 0.1–1.0)
Monocytes Relative: 8 %
Neutro Abs: 5.4 10*3/uL (ref 1.7–7.7)
Neutrophils Relative %: 64 %
Platelet Count: 238 10*3/uL (ref 150–400)
RBC: 4.61 MIL/uL (ref 4.22–5.81)
RDW: 13.3 % (ref 11.5–15.5)
WBC Count: 8.4 10*3/uL (ref 4.0–10.5)
nRBC: 0 % (ref 0.0–0.2)

## 2023-05-04 LAB — LACTATE DEHYDROGENASE: LDH: 215 U/L — ABNORMAL HIGH (ref 98–192)

## 2023-05-04 LAB — PROTIME-INR
INR: 3 — ABNORMAL HIGH (ref 0.8–1.2)
Prothrombin Time: 31.2 seconds — ABNORMAL HIGH (ref 11.4–15.2)

## 2023-05-04 MED ORDER — PREDNISONE 20 MG PO TABS: 20.0000 mg | ORAL_TABLET | Freq: Every day | ORAL | 0 refills | Status: DC

## 2023-05-04 MED ORDER — FAMOTIDINE 20 MG PO TABS: 20.0000 mg | ORAL_TABLET | Freq: Two times a day (BID) | ORAL | 0 refills | Status: DC

## 2023-05-04 MED ORDER — METHYLPREDNISOLONE ACETATE 40 MG/ML IJ SUSP
40.0000 mg | Freq: Once | INTRAMUSCULAR | Status: AC
Start: 2023-05-04 — End: 2023-05-04
  Administered 2023-05-04: 40 mg via INTRAMUSCULAR

## 2023-05-04 NOTE — Telephone Encounter (Signed)
-----   Message from Erenest Blank, NP sent at 05/04/2023  3:59 PM EDT ----- INR great! Continue same regimen with Coumadin. Thank you! ----- Message ----- From: Interface, Lab In [place] Sent: 05/04/2023   1:30 PM EDT To: Erenest Blank, NP

## 2023-05-04 NOTE — Progress Notes (Signed)
Hematology and Oncology Follow Up Visit  Raymond Schuneman Sr. [id number] 04/24/1951 72 y.o. 05/04/2023   Principle Diagnosis:  Thrombus of left femoral vein down to left popliteal vein --idiopathic   Current Therapy:        Pradaxa 150 mg po BID -- started on 05/16/2022 d/c'd Coumadin adjusted to maintain INR of 2.5-3.5   Interim History:  Raymond [name] is here today for follow-up. He is doing fairly well.  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 Both areas appear to be healing nicely.  INR therapeutic at 3.0.  No blood loss noted. No bruising or petechiae.  He has a rash across both eyes that came out after he dog sat for his daughter. He has a follow-up later today with PCP for follow-up and treatment.  No fever, chills, n/v, cough, dizziness, SOB, chest pain, palpitations, abdominal pain or changes in bowel or bladder habits.  No numbness or tingling in his extremities.  No new falls, no syncope reported.  He has poison ivy on the left hand which has been hard for his tennis game.  Appetite and hydration are good. Weight is stable at 180 lbs.   ECOG Performance Status: 1 - Symptomatic but completely ambulatory  Medications:  Allergies as of 05/04/2023       Reactions   Penicillins Hives, Rash      Penicillin V Potassium Other (See Comments)        Medication List        Accurate as of May 04, 2023  1:39 PM. If you have any questions, ask your nurse or doctor.          acetaminophen 500 MG tablet Commonly known as: TYLENOL Take 2 tablets (1,000 mg total) by mouth every 8 (eight) hours as needed.   atorvastatin 10 MG tablet Commonly known as: LIPITOR Take 1 tablet (10 mg total) by mouth daily.   cholecalciferol 25 MCG (1000 UNIT) tablet Commonly known as: VITAMIN D3 Take 1,000 Units by mouth daily.   docusate sodium 100 MG capsule Commonly known as: COLACE Take 1  capsule (100 mg total) by mouth 2 (two) times daily as needed for mild constipation.   folic acid 1 MG tablet Commonly known as: FOLVITE Take 1 tablet (1 mg total) by mouth daily.   gabapentin 100 MG capsule Commonly known as: NEURONTIN Take 2 capsules (200 mg total) by mouth at bedtime.   hydrochlorothiazide 12.5 MG capsule Commonly known as: MICROZIDE TAKE ONE CAPSULE BY MOUTH ONE TIME DAILY   losartan 50 MG tablet Commonly known as: COZAAR TAKE ONE TABLET BY MOUTH ONE TIME DAILY   methocarbamol 500 MG tablet Commonly known as: ROBAXIN Take 2 tablets (1,000 mg total) by mouth every 8 (eight) hours as needed for muscle spasms.   multivitamin with minerals Tabs tablet Take 1 tablet by mouth daily.   oxyCODONE 5 MG immediate release tablet Commonly known as: Oxy IR/ROXICODONE Take 0.5-1 tablets (2.5-5 mg total) by mouth every 6 (six) hours as needed for breakthrough pain.   tadalafil 5 MG tablet Commonly known as: CIALIS Take 1 tablet (5 mg total) by mouth daily.   thiamine 100 MG tablet Commonly known as: Vitamin B-1 Take 1 tablet (100 mg total) by mouth daily.   warfarin 5 MG tablet Commonly known as: COUMADIN Take 1-2 tablets (5-10 mg total) by mouth daily. Take 5mg  by mouth for  two days. On 3rd day, take 10mg . Then repeat.        Allergies:  Allergies  Allergen Reactions   Penicillins Hives and Rash        Penicillin V Potassium Other (See Comments)    Past Medical History, Surgical history, Social history, and Family History were reviewed and updated.  Review of Systems: All other 10 point review of systems is negative.   Physical Exam:  height is 5\' 11"  (1.803 m) and weight is 180 lb 1.9 oz (81.7 kg). His oral temperature is 97.9 F (36.6 C). His pulse is 77. His respiration is 19 and oxygen saturation is 95%.   Wt Readings from Last 3 Encounters:  05/04/23 180 lb 1.9 oz (81.7 kg)  04/11/23 179 lb (81.2 kg)  03/09/23 182 lb 9.6 oz (82.8 kg)     Ocular: Sclerae unicteric, pupils equal, round and reactive to light Ear-nose-throat: Oropharynx clear, dentition fair Lymphatic: No cervical or supraclavicular adenopathy Lungs no rales or rhonchi, good excursion bilaterally Heart regular rate and rhythm, no murmur appreciated Abd soft, nontender, positive bowel sounds MSK no focal spinal tenderness, no joint edema Neuro: non-focal, well-oriented, appropriate affect Breasts: Deferred   Lab Results  Component Value Date   WBC 8.4 05/04/2023   HGB 14.6 05/04/2023   HCT 44.6 05/04/2023   MCV 96.7 05/04/2023   PLT 238 05/04/2023   No results found for: "FERRITIN", "IRON", "TIBC", "UIBC", "IRONPCTSAT" Lab Results  Component Value Date   RBC 4.61 05/04/2023   No results found for: "KPAFRELGTCHN", "LAMBDASER", "KAPLAMBRATIO" No results found for: "IGGSERUM", "IGA", "IGMSERUM" No results found for: "TOTALPROTELP", "ALBUMINELP", "A1GS", "A2GS", "BETS", "BETA2SER", "GAMS", "MSPIKE", "SPEI"   Chemistry      Component Value Date/Time   NA 140 02/09/2023 1424   K 4.3 02/09/2023 1424   CL 101 02/09/2023 1424   CO2 30 02/09/2023 1424   BUN 17 02/09/2023 1424   CREATININE 0.90 02/09/2023 1424      Component Value Date/Time   CALCIUM 9.3 02/09/2023 1424   ALKPHOS 131 (H) 02/09/2023 1424   AST 19 02/09/2023 1424   ALT 17 02/09/2023 1424   BILITOT 0.6 02/09/2023 1424       Impression and Plan: Raymond [name] is a very pleasant 72 yo gentleman with left lower extremity DVT felt to be idiopathic.  He will continue his same regimen with Coumadin, INR 3.0.  We will recheck his INR every 4 weeks per MD.  Follow-up in 4 months.    Eileen Stanford, NP 6/21/20241:39 PM

## 2023-05-04 NOTE — Progress Notes (Signed)
Raymond Griffie Sr. is a 72 y.o. male with the following history as recorded in [employer]:  Patient Active Problem List   Diagnosis Date Noted   Microscopic hematuria 04/11/2023   BPH with obstruction/lower urinary tract symptoms 03/09/2023   Organic impotence 03/09/2023   Traumatic pneumothorax, initial encounter 01/29/2023   Pain of right lower extremity due to ischemia 05/01/2022   Leg DVT (deep venous thromboembolism), chronic, left (HCC) 12/12/2021   Mixed hyperlipidemia 11/21/2021   Low libido 11/21/2021   Low back pain 11/21/2021   Loss of appetite 11/21/2021   Hypertensive retinopathy 11/21/2021   Essential hypertension 11/21/2021   Chronic pain 11/21/2021   Enlarged prostate 06/20/2021   Degenerative disc disease, cervical 12/03/2020   Degenerative lumbar disc 09/27/2020   Degenerative arthritis of left knee 09/27/2020    Current Outpatient Medications  Medication Sig Dispense Refill   acetaminophen (TYLENOL) 500 MG tablet Take 2 tablets (1,000 mg total) by mouth every 8 (eight) hours as needed. 30 tablet 0   atorvastatin (LIPITOR) 10 MG tablet Take 1 tablet (10 mg total) by mouth daily. 90 tablet 0   cholecalciferol (VITAMIN D3) 25 MCG (1000 UNIT) tablet Take 1,000 Units by mouth daily.     docusate sodium (COLACE) 100 MG capsule Take 1 capsule (100 mg total) by mouth 2 (two) times daily as needed for mild constipation. 10 capsule 0   famotidine (PEPCID) 20 MG tablet Take 1 tablet (20 mg total) by mouth 2 (two) times daily. 30 tablet 0   folic acid (FOLVITE) 1 MG tablet Take 1 tablet (1 mg total) by mouth daily.     gabapentin (NEURONTIN) 100 MG capsule Take 2 capsules (200 mg total) by mouth at bedtime. 180 capsule 0   hydrochlorothiazide (MICROZIDE) 12.5 MG capsule TAKE ONE CAPSULE BY MOUTH ONE TIME DAILY 90 capsule 1   losartan (COZAAR) 50 MG tablet TAKE ONE TABLET BY MOUTH ONE TIME DAILY 90 tablet 1   methocarbamol (ROBAXIN) 500 MG tablet Take 2 tablets (1,000 mg  total) by mouth every 8 (eight) hours as needed for muscle spasms. 60 tablet 0   Multiple Vitamin (MULTIVITAMIN WITH MINERALS) TABS tablet Take 1 tablet by mouth daily.     oxyCODONE (OXY IR/ROXICODONE) 5 MG immediate release tablet Take 0.5-1 tablets (2.5-5 mg total) by mouth every 6 (six) hours as needed for breakthrough pain. 12 tablet 0   predniSONE (DELTASONE) 20 MG tablet Take 1 tablet (20 mg total) by mouth daily with breakfast. 5 tablet 0   tadalafil (CIALIS) 5 MG tablet Take 1 tablet (5 mg total) by mouth daily. 30 tablet 11   thiamine (VITAMIN B-1) 100 MG tablet Take 1 tablet (100 mg total) by mouth daily. 30 tablet 0   warfarin (COUMADIN) 5 MG tablet Take 1-2 tablets (5-10 mg total) by mouth daily. Take 5mg  by mouth for two days. On 3rd day, take 10mg . Then repeat. 30 tablet 3   No current facility-administered medications for this visit.    Allergies: Penicillins and Penicillin v potassium  Past Medical History:  Diagnosis Date   DDD (degenerative disc disease), lumbar    DVT (deep venous thrombosis) (HCC)    GERD (gastroesophageal reflux disease)    Hyperlipidemia    Hypertension    Leg DVT (deep venous thromboembolism), chronic, left (HCC) 12/12/2021   Raynaud disease     Past Surgical History:  Procedure Laterality Date   EYE SURGERY      Family History  Problem Relation Age  of Onset   Alcohol abuse Mother    Arthritis Mother    Diabetes Mother    Mental retardation Mother    Alcohol abuse Father    Hypertension Father    Cancer Sister    Depression Sister    Cancer Sister    Arthritis Maternal Grandmother    Parkinson's disease Maternal Grandfather     Social History   Tobacco Use   Smoking status: Never   Smokeless tobacco: Not on file  Substance Use Topics   Alcohol use: Not Currently    Subjective:  Allergic reaction on face- has been dog sitting and feels this is source of symptoms; thinks the dog may have gotten into some type of poison ivy and he  accidentally rubbed the plant oil on his face; also has poison ivy on his hand; requesting cortisone injection today; notes that symptoms have been present since Monday; started using Zyrtec last night and does feel that symptoms are improved today;     Objective:  Vitals:   05/04/23 1434  BP: 136/78  Pulse: 75  SpO2: 96%  Weight: 180 lb (81.6 kg)  Height: 5\' 11"  (1.803 m)    General: Well developed, well nourished, in no acute distress  Skin : Warm and dry. Erythema of entire face/ mild swelling noted around eyes bilaterally;  Head: Normocephalic and atraumatic  Eyes: Sclera and conjunctiva clear; pupils round and reactive to light; extraocular movements intact  Ears: External normal; canals clear; tympanic membranes normal  Oropharynx: Pink, supple. No suspicious lesions  Neck: Supple without thyromegaly, adenopathy  Lungs: Respirations unlabored; Neurologic: Alert and oriented; speech intact; face symmetrical; moves all extremities well; CNII-XII intact without focal deficit   Assessment:  1. Allergic contact dermatitis, unspecified trigger     Plan:  Symptoms already improving with addition of Zyrtec; continue and add Pepcid bid; Depo-Medrol IM 40 mg given in office today; start prednisone 20 mg every day x 5 days tomorrow; follow up worse, no better.   No follow-ups on file.  No orders of the defined types were placed in this encounter.   Requested Prescriptions   Signed Prescriptions Disp Refills   famotidine (PEPCID) 20 MG tablet 30 tablet 0    Sig: Take 1 tablet (20 mg total) by mouth 2 (two) times daily.   predniSONE (DELTASONE) 20 MG tablet 5 tablet 0    Sig: Take 1 tablet (20 mg total) by mouth daily with breakfast.

## 2023-05-04 NOTE — Telephone Encounter (Signed)
Called pt, Unavailable. Lmovm with results and instructions. MyChart msg also sent to pt.

## 2023-05-04 NOTE — Patient Instructions (Signed)
Add the Famotidine 20 mg twice a day to your Zyrtec to help with the itching;  You received a steroid shot today to help with the reaction; I am also going to give you 5 more days of prednisone to make sure we stop this allergic reaction completely; please follow up if the symptoms persist next week;

## 2023-05-10 ENCOUNTER — Ambulatory Visit: Payer: Medicare Other | Admitting: Urology

## 2023-05-16 ENCOUNTER — Encounter: Payer: Self-pay | Admitting: Urology

## 2023-05-16 ENCOUNTER — Ambulatory Visit (INDEPENDENT_AMBULATORY_CARE_PROVIDER_SITE_OTHER): Payer: Medicare Other | Admitting: Urology

## 2023-05-16 VITALS — BP 114/70 | HR 71 | Ht 71.0 in | Wt 180.0 lb

## 2023-05-16 DIAGNOSIS — N529 Male erectile dysfunction, unspecified: Secondary | ICD-10-CM | POA: Diagnosis not present

## 2023-05-16 DIAGNOSIS — R3129 Other microscopic hematuria: Secondary | ICD-10-CM | POA: Diagnosis not present

## 2023-05-16 DIAGNOSIS — N138 Other obstructive and reflux uropathy: Secondary | ICD-10-CM

## 2023-05-16 DIAGNOSIS — N401 Enlarged prostate with lower urinary tract symptoms: Secondary | ICD-10-CM | POA: Diagnosis not present

## 2023-05-16 LAB — URINALYSIS, ROUTINE W REFLEX MICROSCOPIC
Bilirubin, UA: NEGATIVE
Glucose, UA: NEGATIVE
Ketones, UA: NEGATIVE
Leukocytes,UA: NEGATIVE
Nitrite, UA: NEGATIVE
Protein,UA: NEGATIVE
Specific Gravity, UA: 1.03 (ref 1.005–1.030)
Urobilinogen, Ur: 0.2 mg/dL (ref 0.2–1.0)
pH, UA: 5.5 (ref 5.0–7.5)

## 2023-05-16 LAB — MICROSCOPIC EXAMINATION
Bacteria, UA: NONE SEEN
Cast Type: NONE SEEN
Casts: NONE SEEN /lpf
Crystal Type: NONE SEEN
Crystals: NONE SEEN
Epithelial Cells (non renal): NONE SEEN /hpf (ref 0–10)
Renal Epithel, UA: NONE SEEN /hpf
Trichomonas, UA: NONE SEEN
WBC, UA: NONE SEEN /hpf (ref 0–5)
Yeast, UA: NONE SEEN

## 2023-05-16 NOTE — Progress Notes (Signed)
Assessment: 1. BPH with obstruction/lower urinary tract symptoms   2. Organic impotence     Plan: I discussed options for management of his BPH with lower urinary tract symptoms including a trial of an alpha-blocker.  He is not interested in pursuing any additional medical therapy at this time. Return to office in 6 months.  Chief Complaint:  Chief Complaint  Patient presents with   Benign Prostatic Hypertrophy    History of Present Illness:  Raymond Rathgeber Sr. is a 72 y.o. male who is seen for continued evaluation of BPH with LUTS and erectile dysfunction.   He had has a history of BPH with some very mild lower urinary tract symptoms for a number of years.  He was previously followed by Dr. Janice Norrie in [place], [place].  He had not been on any medical therapy for his BPH symptoms.  He noted some gradual worsening of his symptoms with frequent urination in the morning with a sensation of incomplete emptying, decreased stream, and nocturia 1-2 times at night.  No dysuria or gross hematuria.  No history of UTIs or prostatitis. IPSS = 14 with QOL 4/6. PVR = 64 ml  PSA from 7/23: 3.64  He also reported difficulty achieving an adequate erection for intercourse.  He is a widower and had not been sexually active for period of >1-year.  He currently has a partner and is having difficulty achieving an adequate erection for intercourse.  No pain or curvature.  He has some slight decrease in his libido.  He had not tried any medical therapy for erectile dysfunction.  He does report a prior history of decreased sensation with ejaculation.  He was given a trial of tadalafil 5 mg daily at his visit in 4/24.  At his visit in 5/24, he continued on tadalafil 5 mg daily.  He had noted some slight improvement in his urinary symptoms.  He was voiding with a good stream.  He continued with nocturia x 1.  He also continued to have some frequent urination and sensation of incomplete emptying primarily in  the morning.  No dysuria or gross hematuria. IPSS = 13.  PVR = 16 ml. He had not seen any change in his erectile dysfunction with the daily tadalafil.  However, he has not tried to engage in sexual activity recently. U/A showed 3-10 RBCs.  He returns today for follow-up.  He reports no significant change in his urinary symptoms.  He continues with frequency, sensation of incomplete emptying, weak stream, and nocturia x 1.  No dysuria or gross hematuria. IPSS = 18 today. He does not wish to continue medical therapy for his urinary symptoms at the present time.  Portions of the above documentation were copied from a prior visit for review purposes only.   Past Medical History:  Past Medical History:  Diagnosis Date   DDD (degenerative disc disease), lumbar    DVT (deep venous thrombosis) (HCC)    GERD (gastroesophageal reflux disease)    Hyperlipidemia    Hypertension    Leg DVT (deep venous thromboembolism), chronic, left (HCC) 12/12/2021   Raynaud disease     Past Surgical History:  Past Surgical History:  Procedure Laterality Date   EYE SURGERY      Allergies:  Allergies  Allergen Reactions   Penicillins Hives and Rash        Penicillin V Potassium Other (See Comments)    Family History:  Family History  Problem Relation Age of Onset   Alcohol  abuse Mother    Arthritis Mother    Diabetes Mother    Mental retardation Mother    Alcohol abuse Father    Hypertension Father    Cancer Sister    Depression Sister    Cancer Sister    Arthritis Maternal Grandmother    Parkinson's disease Maternal Grandfather     Social History:  Social History   Tobacco Use   Smoking status: Never  Vaping Use   Vaping Use: Never used  Substance Use Topics   Alcohol use: Not Currently   Drug use: Not Currently    ROS: Constitutional:  Negative for fever, chills, weight loss CV: Negative for chest pain, previous MI, hypertension Respiratory:  Negative for shortness of  breath, wheezing, sleep apnea, frequent cough GI:  Negative for nausea, vomiting, bloody stool, GERD  Physical exam: BP 114/70   Pulse 71   Ht 5\' 11"  (1.803 m)   Wt 180 lb (81.6 kg)   BMI 25.10 kg/m  GENERAL APPEARANCE:  Well appearing, well developed, well nourished, NAD HEENT:  Atraumatic, normocephalic, oropharynx clear NECK:  Supple without lymphadenopathy or thyromegaly ABDOMEN:  Soft, non-tender, no masses EXTREMITIES:  Moves all extremities well, without clubbing, cyanosis, or edema NEUROLOGIC:  Alert and oriented x 3, normal gait, CN II-XII grossly intact MENTAL STATUS:  appropriate BACK:  Non-tender to palpation, No CVAT SKIN:  Warm, dry, and intact  Results: U/A: 0-2 RBC

## 2023-05-23 ENCOUNTER — Ambulatory Visit: Payer: Medicare Other | Admitting: Family Medicine

## 2023-05-29 ENCOUNTER — Other Ambulatory Visit: Payer: Self-pay

## 2023-05-29 DIAGNOSIS — I82502 Chronic embolism and thrombosis of unspecified deep veins of left lower extremity: Secondary | ICD-10-CM

## 2023-05-29 MED ORDER — WARFARIN SODIUM 5 MG PO TABS
5.0000 mg | ORAL_TABLET | Freq: Every day | ORAL | 3 refills | Status: DC
Start: 2023-05-29 — End: 2023-10-16

## 2023-05-31 NOTE — Progress Notes (Deleted)
[facility] [address] [place] [address] Phone: [phone] Subjective:    I'm seeing this patient by the request  of:  Olive Bass, FNP  CC:   LKG:[id number]  02/19/2023 Encourage patient to follow-up, patient still has not followed up with urology at this time   Chronic problem but did take a step back secondary to patient with most recent pneumothorax, does have the rib fractures as well. I do think that patient can start to slowly increase activity. Given some home exercises that I think will be beneficial. Patient wants to hold on anything such as physical therapy at the moment. Increase activity slowly. Will follow-up again in 6 to 8 weeks. Total time reviewing patient's notes from his inpatient as well as reviewing the imaging as well as discussing with patient 31 minutes   Updated 06/08/2023 Raymond [name] Sr. is a 72 y.o. male coming in with complaint of back pain       Past Medical History:  Diagnosis Date   DDD (degenerative disc disease), lumbar    DVT (deep venous thrombosis) (HCC)    GERD (gastroesophageal reflux disease)    Hyperlipidemia    Hypertension    Leg DVT (deep venous thromboembolism), chronic, left (HCC) 12/12/2021   Raynaud disease    Past Surgical History:  Procedure Laterality Date   EYE SURGERY     Social History   Socioeconomic History   Marital status: Widowed    Spouse name: Not on file   Number of children: Not on file   Years of education: Not on file   Highest education level: Professional school degree (e.g., MD, DDS, DVM, JD)  Occupational History   Not on file  Tobacco Use   Smoking status: Never   Smokeless tobacco: Not on file  Vaping Use   Vaping status: Never Used  Substance and Sexual Activity   Alcohol use: Not Currently   Drug use: Not Currently   Sexual activity: Not Currently  Other Topics Concern   Not on file  Social History Narrative   Not on file    Social Determinants of Health   Financial Resource Strain: Medium Risk (02/02/2023)   Overall Financial Resource Strain (CARDIA)    Difficulty of Paying Living Expenses: Somewhat hard  Food Insecurity: No Food Insecurity (02/02/2023)   Hunger Vital Sign    Worried About Running Out of Food in the Last Year: Never true    Ran Out of Food in the Last Year: Never true  Transportation Needs: No Transportation Needs (02/02/2023)   PRAPARE - Administrator, Civil Service (Medical): No    Lack of Transportation (Non-Medical): No  Physical Activity: Sufficiently Active (02/02/2023)   Exercise Vital Sign    Days of Exercise per Week: 4 days    Minutes of Exercise per Session: 70 min  Stress: No Stress Concern Present (02/02/2023)   [employer] of Occupational Health - Occupational Stress Questionnaire    Feeling of Stress : Not at all  Social Connections: Moderately Isolated (02/02/2023)   Social Connection and Isolation Panel [NHANES]    Frequency of Communication with Friends and Family: Three times a week    Frequency of Social Gatherings with Friends and Family: Three times a week    Attends Religious Services: Never    Active Member of Clubs or Organizations: Yes    Attends Banker Meetings: More than 4 times per year    Marital  Status: Widowed   Allergies  Allergen Reactions   Penicillins Hives and Rash        Penicillin V Potassium Other (See Comments)   Family History  Problem Relation Age of Onset   Alcohol abuse Mother    Arthritis Mother    Diabetes Mother    Mental retardation Mother    Alcohol abuse Father    Hypertension Father    Cancer Sister    Depression Sister    Cancer Sister    Arthritis Maternal Grandmother    Parkinson's disease Maternal Grandfather      Current Outpatient Medications (Cardiovascular):    atorvastatin (LIPITOR) 10 MG tablet, Take 1 tablet (10 mg total) by mouth daily.   hydrochlorothiazide (MICROZIDE)  12.5 MG capsule, TAKE ONE CAPSULE BY MOUTH ONE TIME DAILY   losartan (COZAAR) 50 MG tablet, TAKE ONE TABLET BY MOUTH ONE TIME DAILY   tadalafil (CIALIS) 5 MG tablet, Take 1 tablet (5 mg total) by mouth daily.   Current Outpatient Medications (Analgesics):    acetaminophen (TYLENOL) 500 MG tablet, Take 2 tablets (1,000 mg total) by mouth every 8 (eight) hours as needed.   oxyCODONE (OXY IR/ROXICODONE) 5 MG immediate release tablet, Take 0.5-1 tablets (2.5-5 mg total) by mouth every 6 (six) hours as needed for breakthrough pain.  Current Outpatient Medications (Hematological):    folic acid (FOLVITE) 1 MG tablet, Take 1 tablet (1 mg total) by mouth daily.   warfarin (COUMADIN) 5 MG tablet, Take 1-2 tablets (5-10 mg total) by mouth daily. Take 5mg  by mouth for two days. On 3rd day, take 10mg . Then repeat.  Current Outpatient Medications (Other):    cholecalciferol (VITAMIN D3) 25 MCG (1000 UNIT) tablet, Take 1,000 Units by mouth daily.   docusate sodium (COLACE) 100 MG capsule, Take 1 capsule (100 mg total) by mouth 2 (two) times daily as needed for mild constipation.   famotidine (PEPCID) 20 MG tablet, Take 1 tablet (20 mg total) by mouth 2 (two) times daily. (Patient not taking: Reported on 05/16/2023)   gabapentin (NEURONTIN) 100 MG capsule, Take 2 capsules (200 mg total) by mouth at bedtime.   methocarbamol (ROBAXIN) 500 MG tablet, Take 2 tablets (1,000 mg total) by mouth every 8 (eight) hours as needed for muscle spasms.   Multiple Vitamin (MULTIVITAMIN WITH MINERALS) TABS tablet, Take 1 tablet by mouth daily.   thiamine (VITAMIN B-1) 100 MG tablet, Take 1 tablet (100 mg total) by mouth daily.   Reviewed prior external information including notes and imaging from  primary care provider As well as notes that were available from care everywhere and other healthcare systems.  Past medical history, social, surgical and family history all reviewed in electronic medical record.  No pertanent  information unless stated regarding to the chief complaint.   Review of Systems:  No headache, visual changes, nausea, vomiting, diarrhea, constipation, dizziness, abdominal pain, skin rash, fevers, chills, night sweats, weight loss, swollen lymph nodes, body aches, joint swelling, chest pain, shortness of breath, mood changes. POSITIVE muscle aches  Objective  There were no vitals taken for this visit.   General: No apparent distress alert and oriented x3 mood and affect normal, dressed appropriately.  HEENT: Pupils equal, extraocular movements intact  Respiratory: Patient's speak in full sentences and does not appear short of breath  Cardiovascular: No lower extremity edema, non tender, no erythema      Impression and Recommendations:

## 2023-06-01 ENCOUNTER — Inpatient Hospital Stay: Payer: Medicare Other | Attending: Family

## 2023-06-01 DIAGNOSIS — I82502 Chronic embolism and thrombosis of unspecified deep veins of left lower extremity: Secondary | ICD-10-CM

## 2023-06-01 DIAGNOSIS — Z7901 Long term (current) use of anticoagulants: Secondary | ICD-10-CM | POA: Diagnosis not present

## 2023-06-01 DIAGNOSIS — Z86718 Personal history of other venous thrombosis and embolism: Secondary | ICD-10-CM | POA: Insufficient documentation

## 2023-06-01 LAB — CBC WITH DIFFERENTIAL (CANCER CENTER ONLY)
Abs Immature Granulocytes: 0.02 10*3/uL (ref 0.00–0.07)
Basophils Absolute: 0 10*3/uL (ref 0.0–0.1)
Basophils Relative: 1 %
Eosinophils Absolute: 0.1 10*3/uL (ref 0.0–0.5)
Eosinophils Relative: 1 %
HCT: 46.8 % (ref 39.0–52.0)
Hemoglobin: 15.4 g/dL (ref 13.0–17.0)
Immature Granulocytes: 0 %
Lymphocytes Relative: 24 %
Lymphs Abs: 1.6 10*3/uL (ref 0.7–4.0)
MCH: 31.8 pg (ref 26.0–34.0)
MCHC: 32.9 g/dL (ref 30.0–36.0)
MCV: 96.5 fL (ref 80.0–100.0)
Monocytes Absolute: 0.7 10*3/uL (ref 0.1–1.0)
Monocytes Relative: 10 %
Neutro Abs: 4.3 10*3/uL (ref 1.7–7.7)
Neutrophils Relative %: 64 %
Platelet Count: 213 10*3/uL (ref 150–400)
RBC: 4.85 MIL/uL (ref 4.22–5.81)
RDW: 13.3 % (ref 11.5–15.5)
WBC Count: 6.8 10*3/uL (ref 4.0–10.5)
nRBC: 0 % (ref 0.0–0.2)

## 2023-06-01 LAB — PROTIME-INR
INR: 3.5 — ABNORMAL HIGH (ref 0.8–1.2)
Prothrombin Time: 35.4 seconds — ABNORMAL HIGH (ref 11.4–15.2)

## 2023-06-05 ENCOUNTER — Ambulatory Visit (INDEPENDENT_AMBULATORY_CARE_PROVIDER_SITE_OTHER): Payer: Medicare Other | Admitting: *Deleted

## 2023-06-05 DIAGNOSIS — Z1211 Encounter for screening for malignant neoplasm of colon: Secondary | ICD-10-CM | POA: Diagnosis not present

## 2023-06-05 DIAGNOSIS — Z0000 Encounter for general adult medical examination without abnormal findings: Secondary | ICD-10-CM

## 2023-06-05 NOTE — Patient Instructions (Signed)
Raymond [name] , Thank you for taking time to come for your Medicare Wellness Visit. I appreciate your ongoing commitment to your health goals. Please review the following plan we discussed and let me know if I can assist you in the future.   These are the goals we discussed:  Goals   None     This is a list of the screening recommended for you and due dates:  Health Maintenance  Topic Date Due   Hepatitis C Screening  Never done   Zoster (Shingles) Vaccine (1 of 2) Never done   Cologuard (Stool DNA test)  Never done   COVID-19 Vaccine (5 - 2023-24 season) 07/14/2022   Flu Shot  06/14/2023   Medicare Annual Wellness Visit  06/04/2024   DTaP/Tdap/Td vaccine (3 - Td or Tdap) 01/28/2033   Pneumonia Vaccine  Completed   HPV Vaccine  Aged Out     Next appointment: Follow up in one year for your annual wellness visit.   Preventive Care 61 Years and Older, Male Preventive care refers to lifestyle choices and visits with your health care provider that can promote health and wellness. What does preventive care include? A yearly physical exam. This is also called an annual well check. Dental exams once or twice a year. Routine eye exams. Ask your health care provider how often you should have your eyes checked. Personal lifestyle choices, including: Daily care of your teeth and gums. Regular physical activity. Eating a healthy diet. Avoiding tobacco and drug use. Limiting alcohol use. Practicing safe sex. Taking low doses of aspirin every day. Taking vitamin and mineral supplements as recommended by your health care provider. What happens during an annual well check? The services and screenings done by your health care provider during your annual well check will depend on your age, overall health, lifestyle risk factors, and family history of disease. Counseling  Your health care provider may ask you questions about your: Alcohol use. Tobacco use. Drug use. Emotional  well-being. Home and relationship well-being. Sexual activity. Eating habits. History of falls. Memory and ability to understand (cognition). Work and work Astronomer. Screening  You may have the following tests or measurements: Height, weight, and BMI. Blood pressure. Lipid and cholesterol levels. These may be checked every 5 years, or more frequently if you are over 31 years old. Skin check. Lung cancer screening. You may have this screening every year starting at age 5 if you have a 30-pack-year history of smoking and currently smoke or have quit within the past 15 years. Fecal occult blood test (FOBT) of the stool. You may have this test every year starting at age 69. Flexible sigmoidoscopy or colonoscopy. You may have a sigmoidoscopy every 5 years or a colonoscopy every 10 years starting at age 25. Prostate cancer screening. Recommendations will vary depending on your family history and other risks. Hepatitis C blood test. Hepatitis B blood test. Sexually transmitted disease (STD) testing. Diabetes screening. This is done by checking your blood sugar (glucose) after you have not eaten for a while (fasting). You may have this done every 1-3 years. Abdominal aortic aneurysm (AAA) screening. You may need this if you are a current or former smoker. Osteoporosis. You may be screened starting at age 50 if you are at high risk. Talk with your health care provider about your test results, treatment options, and if necessary, the need for more tests. Vaccines  Your health care provider may recommend certain vaccines, such as: Influenza vaccine. This is  recommended every year. Tetanus, diphtheria, and acellular pertussis (Tdap, Td) vaccine. You may need a Td booster every 10 years. Zoster vaccine. You may need this after age 78. Pneumococcal 13-valent conjugate (PCV13) vaccine. One dose is recommended after age 58. Pneumococcal polysaccharide (PPSV23) vaccine. One dose is recommended after  age 46. Talk to your health care provider about which screenings and vaccines you need and how often you need them. This information is not intended to replace advice given to you by your health care provider. Make sure you discuss any questions you have with your health care provider. Document Released: 11/26/2015 Document Revised: 07/19/2016 Document Reviewed: 08/31/2015 Elsevier Interactive Patient Education  2017 [employer].  Fall Prevention in the Home Falls can cause injuries. They can happen to people of all ages. There are many things you can do to make your home safe and to help prevent falls. What can I do on the outside of my home? Regularly fix the edges of walkways and driveways and fix any cracks. Remove anything that might make you trip as you walk through a door, such as a raised step or threshold. Trim any bushes or trees on the path to your home. Use bright outdoor lighting. Clear any walking paths of anything that might make someone trip, such as rocks or tools. Regularly check to see if handrails are loose or broken. Make sure that both sides of any steps have handrails. Any raised decks and porches should have guardrails on the edges. Have any leaves, snow, or ice cleared regularly. Use sand or salt on walking paths during winter. Clean up any spills in your garage right away. This includes oil or grease spills. What can I do in the bathroom? Use night lights. Install grab bars by the toilet and in the tub and shower. Do not use towel bars as grab bars. Use non-skid mats or decals in the tub or shower. If you need to sit down in the shower, use a plastic, non-slip stool. Keep the floor dry. Clean up any water that spills on the floor as soon as it happens. Remove soap buildup in the tub or shower regularly. Attach bath mats securely with double-sided non-slip rug tape. Do not have throw rugs and other things on the floor that can make you trip. What can I do in the  bedroom? Use night lights. Make sure that you have a light by your bed that is easy to reach. Do not use any sheets or blankets that are too big for your bed. They should not hang down onto the floor. Have a firm chair that has side arms. You can use this for support while you get dressed. Do not have throw rugs and other things on the floor that can make you trip. What can I do in the kitchen? Clean up any spills right away. Avoid walking on wet floors. Keep items that you use a lot in easy-to-reach places. If you need to reach something above you, use a strong step stool that has a grab bar. Keep electrical cords out of the way. Do not use floor polish or wax that makes floors slippery. If you must use wax, use non-skid floor wax. Do not have throw rugs and other things on the floor that can make you trip. What can I do with my stairs? Do not leave any items on the stairs. Make sure that there are handrails on both sides of the stairs and use them. Fix handrails that are  broken or loose. Make sure that handrails are as long as the stairways. Check any carpeting to make sure that it is firmly attached to the stairs. Fix any carpet that is loose or worn. Avoid having throw rugs at the top or bottom of the stairs. If you do have throw rugs, attach them to the floor with carpet tape. Make sure that you have a light switch at the top of the stairs and the bottom of the stairs. If you do not have them, ask someone to add them for you. What else can I do to help prevent falls? Wear shoes that: Do not have high heels. Have rubber bottoms. Are comfortable and fit you well. Are closed at the toe. Do not wear sandals. If you use a stepladder: Make sure that it is fully opened. Do not climb a closed stepladder. Make sure that both sides of the stepladder are locked into place. Ask someone to hold it for you, if possible. Clearly mark and make sure that you can see: Any grab bars or  handrails. First and last steps. Where the edge of each step is. Use tools that help you move around (mobility aids) if they are needed. These include: Canes. Walkers. Scooters. Crutches. Turn on the lights when you go into a dark area. Replace any light bulbs as soon as they burn out. Set up your furniture so you have a clear path. Avoid moving your furniture around. If any of your floors are uneven, fix them. If there are any pets around you, be aware of where they are. Review your medicines with your doctor. Some medicines can make you feel dizzy. This can increase your chance of falling. Ask your doctor what other things that you can do to help prevent falls. This information is not intended to replace advice given to you by your health care provider. Make sure you discuss any questions you have with your health care provider. Document Released: 08/26/2009 Document Revised: 04/06/2016 Document Reviewed: 12/04/2014 Elsevier Interactive Patient Education  2017 [employer].

## 2023-06-05 NOTE — Progress Notes (Signed)
Subjective:   Raymond Scarantino Sr. is a 72 y.o. male who presents for Medicare Annual/Subsequent preventive examination.  Visit Complete: Virtual  I connected with  Raymond Searles Sr. on 06/05/23 by a audio enabled telemedicine application and verified that I am speaking with the correct person using two identifiers.  Patient Location: Home  Provider Location: Office/Clinic  I discussed the limitations of evaluation and management by telemedicine. The patient expressed understanding and agreed to proceed.  Patient Medicare AWV questionnaire was completed by the patient on 06/04/23; I have confirmed that all information answered by patient is correct and no changes since this date.  Review of Systems     Cardiac Risk Factors include: advanced age (>69men, >58 women);male gender;dyslipidemia;hypertension     Objective:    Patient was unable to self-report vital signs via telehealth due to a lack of equipment at home.      06/05/2023    3:12 PM 02/09/2023    2:40 PM 01/30/2023    7:58 AM 01/29/2023    4:39 PM 11/08/2022    1:10 PM 08/16/2022    1:07 PM 06/01/2022    2:25 PM  Advanced Directives  Does Patient Have a Medical Advance Directive? No No  No No No No  Type of Advance Directive  Living will;Healthcare Power of Attorney       Does patient want to make changes to medical advance directive?  No - Patient declined   No - Patient declined No - Patient declined No - Patient declined  Would patient like information on creating a medical advance directive? No - Patient declined  No - Patient declined  No - Patient declined  No - Patient declined    Current Medications (verified) Outpatient Encounter Medications as of 06/05/2023  Medication Sig   acetaminophen (TYLENOL) 500 MG tablet Take 2 tablets (1,000 mg total) by mouth every 8 (eight) hours as needed.   atorvastatin (LIPITOR) 10 MG tablet Take 1 tablet (10 mg total) by mouth daily.   cholecalciferol (VITAMIN D3) 25  MCG (1000 UNIT) tablet Take 1,000 Units by mouth daily.   famotidine (PEPCID) 20 MG tablet Take 1 tablet (20 mg total) by mouth 2 (two) times daily. (Patient not taking: Reported on 05/16/2023)   folic acid (FOLVITE) 1 MG tablet Take 1 tablet (1 mg total) by mouth daily.   gabapentin (NEURONTIN) 100 MG capsule Take 2 capsules (200 mg total) by mouth at bedtime.   hydrochlorothiazide (MICROZIDE) 12.5 MG capsule TAKE ONE CAPSULE BY MOUTH ONE TIME DAILY   losartan (COZAAR) 50 MG tablet TAKE ONE TABLET BY MOUTH ONE TIME DAILY   Multiple Vitamin (MULTIVITAMIN WITH MINERALS) TABS tablet Take 1 tablet by mouth daily.   tadalafil (CIALIS) 5 MG tablet Take 1 tablet (5 mg total) by mouth daily.   thiamine (VITAMIN B-1) 100 MG tablet Take 1 tablet (100 mg total) by mouth daily.   warfarin (COUMADIN) 5 MG tablet Take 1-2 tablets (5-10 mg total) by mouth daily. Take 5mg  by mouth for two days. On 3rd day, take 10mg . Then repeat.   [DISCONTINUED] docusate sodium (COLACE) 100 MG capsule Take 1 capsule (100 mg total) by mouth 2 (two) times daily as needed for mild constipation.   [DISCONTINUED] methocarbamol (ROBAXIN) 500 MG tablet Take 2 tablets (1,000 mg total) by mouth every 8 (eight) hours as needed for muscle spasms.   [DISCONTINUED] oxyCODONE (OXY IR/ROXICODONE) 5 MG immediate release tablet Take 0.5-1 tablets (2.5-5 mg total) by mouth  every 6 (six) hours as needed for breakthrough pain.   No facility-administered encounter medications on file as of 06/05/2023.    Allergies (verified) Penicillins and Penicillin v potassium   History: Past Medical History:  Diagnosis Date   Allergy penicillin   DDD (degenerative disc disease), lumbar    DVT (deep venous thrombosis) (HCC)    GERD (gastroesophageal reflux disease)    Hyperlipidemia    Hypertension    Leg DVT (deep venous thromboembolism), chronic, left (HCC) 12/12/2021   Raynaud disease    Past Surgical History:  Procedure Laterality Date   EYE  SURGERY     Family History  Problem Relation Age of Onset   Alcohol abuse Mother    Arthritis Mother    Diabetes Mother    Mental retardation Mother    Alcohol abuse Father    Hypertension Father    Cancer Sister    Depression Sister    Cancer Sister    Arthritis Maternal Grandmother    Parkinson's disease Maternal Grandfather    Social History   Socioeconomic History   Marital status: Widowed    Spouse name: Not on file   Number of children: Not on file   Years of education: Not on file   Highest education level: Professional school degree (e.g., MD, DDS, DVM, JD)  Occupational History   Not on file  Tobacco Use   Smoking status: Never   Smokeless tobacco: Not on file  Vaping Use   Vaping status: Never Used  Substance and Sexual Activity   Alcohol use: Yes    Alcohol/week: 10.0 standard drinks of alcohol    Types: 10 Glasses of wine per week   Drug use: Not Currently   Sexual activity: Not Currently  Other Topics Concern   Not on file  Social History Narrative   Not on file   Social Determinants of Health   Financial Resource Strain: Low Risk  (06/04/2023)   Overall Financial Resource Strain (CARDIA)    Difficulty of Paying Living Expenses: Not very hard  Food Insecurity: No Food Insecurity (06/04/2023)   Hunger Vital Sign    Worried About Running Out of Food in the Last Year: Never true    Ran Out of Food in the Last Year: Never true  Transportation Needs: No Transportation Needs (06/04/2023)   PRAPARE - Administrator, Civil Service (Medical): No    Lack of Transportation (Non-Medical): No  Physical Activity: Sufficiently Active (06/04/2023)   Exercise Vital Sign    Days of Exercise per Week: 6 days    Minutes of Exercise per Session: 60 min  Stress: Stress Concern Present (06/04/2023)   [employer] of Occupational Health - Occupational Stress Questionnaire    Feeling of Stress : To some extent  Social Connections: Moderately Isolated  (06/04/2023)   Social Connection and Isolation Panel [NHANES]    Frequency of Communication with Friends and Family: More than three times a week    Frequency of Social Gatherings with Friends and Family: Twice a week    Attends Religious Services: Never    Database administrator or Organizations: Yes    Attends Engineer, structural: More than 4 times per year    Marital Status: Widowed    Tobacco Counseling Counseling given: Not Answered   Clinical Intake:  Pre-visit preparation completed: Yes  Pain : No/denies pain     Nutritional Risks: None Diabetes: No  How often do you need to have someone  help you when you read instructions, pamphlets, or other written materials from your doctor or pharmacy?: 1 - Never  Interpreter Needed?: No  Information entered by :: Donne Anon, CMA   Activities of Daily Living    06/04/2023    8:44 AM 01/29/2023    8:23 PM  In your present state of health, do you have any difficulty performing the following activities:  Hearing? 0 0  Vision? 0 0  Difficulty concentrating or making decisions? 0 0  Walking or climbing stairs? 0 0  Dressing or bathing? 0 1  Doing errands, shopping? 0 0  Preparing Food and eating ? N   Using the Toilet? N   In the past six months, have you accidently leaked urine? N   Do you have problems with loss of bowel control? N   Managing your Medications? N   Managing your Finances? N   Housekeeping or managing your Housekeeping? N     Patient Care Team: Olive Bass, FNP as PCP - General (Internal Medicine)  Indicate any recent Medical Services you may have received from other than [facility] providers in the past year (date may be approximate).     Assessment:   This is a routine wellness examination for Nyxon.  Hearing/Vision screen No results found.  Dietary issues and exercise activities discussed:     Goals Addressed   None    Depression Screen    06/05/2023    3:13 PM 06/29/2022     1:47 PM 06/01/2022    2:28 PM 01/06/2022   11:39 AM  PHQ 2/9 Scores  PHQ - 2 Score 2 1 2 4   PHQ- 9 Score   9 11    Fall Risk    06/04/2023    8:44 AM 02/23/2023   11:23 AM 06/29/2022    1:47 PM 06/01/2022    2:27 PM 01/06/2022   10:46 AM  Fall Risk   Falls in the past year? 1 1 0 1 0  Comment fell over some uneven ground in back yard      Number falls in past yr: 1 0 0 0   Injury with Fall? 1 1 0 1   Risk for fall due to : History of fall(s) History of fall(s) No Fall Risks Impaired balance/gait   Follow up Falls evaluation completed Falls evaluation completed Falls evaluation completed Falls evaluation completed     MEDICARE RISK AT HOME:  Medicare Risk at Home - 06/05/23 1526     Any stairs in or around the home? Yes    If so, are there any without handrails? No    Home free of loose throw rugs in walkways, pet beds, electrical cords, etc? Yes    Adequate lighting in your home to reduce risk of falls? Yes    Life alert? No    Use of a cane, walker or w/c? No    Grab bars in the bathroom? Yes    Shower chair or bench in shower? No    Elevated toilet seat or a handicapped toilet? No             TIMED UP AND GO:  Was the test performed?  No    Cognitive Function:        06/05/2023    3:14 PM 06/01/2022    2:39 PM  6CIT Screen  What Year? 0 points 0 points  What month? 0 points 0 points  What time? 0 points 0 points  Count  back from 20 0 points 0 points  Months in reverse 0 points 0 points  Repeat phrase 0 points 0 points  Total Score 0 points 0 points    Immunizations Immunization History  Administered Date(s) Administered   Fluad Quad(high Dose 65+) 08/30/2021   Influenza, High Dose Seasonal PF 08/18/2019   PFIZER(Purple Top)SARS-COV-2 Vaccination 12/19/2019, 01/21/2020, 10/24/2020, 02/28/2021   Pneumococcal Conjugate-13 01/23/2017   Pneumococcal Polysaccharide-23 05/22/2018   Tdap 02/17/2021, 01/29/2023    TDAP status: Up to date  Flu Vaccine  status: Up to date  Pneumococcal vaccine status: Up to date  Covid-19 vaccine status: Information provided on how to obtain vaccines.   Qualifies for Shingles Vaccine? Yes   Zostavax completed No   Shingrix Completed?: No.    Education has been provided regarding the importance of this vaccine. Patient has been advised to call insurance company to determine out of pocket expense if they have not yet received this vaccine. Advised may also receive vaccine at local pharmacy or Health Dept. Verbalized acceptance and understanding.  Screening Tests Health Maintenance  Topic Date Due   Hepatitis C Screening  Never done   Zoster Vaccines- Shingrix (1 of 2) Never done   Fecal DNA (Cologuard)  Never done   COVID-19 Vaccine (5 - 2023-24 season) 07/14/2022   Medicare Annual Wellness (AWV)  06/02/2023   INFLUENZA VACCINE  06/14/2023   DTaP/Tdap/Td (3 - Td or Tdap) 01/28/2033   Pneumonia Vaccine 54+ Years old  Completed   HPV VACCINES  Aged Out    Health Maintenance  Health Maintenance Due  Topic Date Due   Hepatitis C Screening  Never done   Zoster Vaccines- Shingrix (1 of 2) Never done   Fecal DNA (Cologuard)  Never done   COVID-19 Vaccine (5 - 2023-24 season) 07/14/2022   Medicare Annual Wellness (AWV)  06/02/2023    Colorectal cancer screen: Cologuard ordered today  Lung Cancer Screening: (Low Dose CT Chest recommended if Age 99-80 years, 20 pack-year currently smoking OR have quit w/in 15years.) does not qualify.  Additional Screening:  Hepatitis C Screening: does qualify; Completed N/a  Vision Screening: Recommended annual ophthalmology exams for early detection of glaucoma and other disorders of the eye. Is the patient up to date with their annual eye exam?  Yes  Who is the provider or what is the name of the office in which the patient attends annual eye exams? Can't remember name at this time If pt is not established with a provider, would they like to be referred to a  provider to establish care? No .   Dental Screening: Recommended annual dental exams for proper oral hygiene  Diabetic Foot Exam: N/a  Community Resource Referral / Chronic Care Management: CRR required this visit?  No   CCM required this visit?  No     Plan:     I have personally reviewed and noted the following in the patient's chart:   Medical and social history Use of alcohol, tobacco or illicit drugs  Current medications and supplements including opioid prescriptions. Patient is not currently taking opioid prescriptions. Functional ability and status Nutritional status Physical activity Advanced directives List of other physicians Hospitalizations, surgeries, and ER visits in previous 12 months Vitals Screenings to include cognitive, depression, and falls Referrals and appointments  In addition, I have reviewed and discussed with patient certain preventive protocols, quality metrics, and best practice recommendations. A written personalized care plan for preventive services as well as general preventive health recommendations  were provided to patient.     Donne Anon, CMA   06/05/2023   After Visit Summary: (MyChart) Due to this being a telephonic visit, the after visit summary with patients personalized plan was offered to patient via MyChart   Nurse Notes: None

## 2023-06-08 ENCOUNTER — Ambulatory Visit: Payer: Medicare Other | Admitting: Family Medicine

## 2023-06-11 NOTE — Progress Notes (Unsigned)
[facility] [address] [place] [address] Phone: [phone] Subjective:   Raymond [name], am serving as a scribe for Dr. Antoine Primas.  I'm seeing this patient by the request  of:  Olive Bass, FNP  CC: Back pain, left knee pain  UVO:[id number]  02/19/2023 Encourage patient to follow-up, patient still has not followed up with urology at this time  Chronic problem but did take a step back secondary to patient with most recent pneumothorax, does have the rib fractures as well.  I do think that patient can start to slowly increase activity.  Given some home exercises that I think will be beneficial.  Patient wants to hold on anything such as physical therapy at the moment.  Increase activity slowly.  Will follow-up again in 6 to 8 weeks.  Total time reviewing patient's notes from his inpatient as well as reviewing the imaging as well as discussing with patient 31 minutes     Updated 06/14/2023 Raymond Searles Sr. is a 72 y.o. male coming in with complaint of back pain. Patient states that he is doing better. Is playing less tennis than he had been. Pain in lumbar spine in L side about 1 month ago but this has subsided. Not taking NSAIDs prior to playing but he does not notice a difference.   Pain in L knee but he does not want to do anything at this time. Pain occurs mostly at night but not during tennis.        Past Medical History:  Diagnosis Date   Allergy penicillin   DDD (degenerative disc disease), lumbar    DVT (deep venous thrombosis) (HCC)    GERD (gastroesophageal reflux disease)    Hyperlipidemia    Hypertension    Leg DVT (deep venous thromboembolism), chronic, left (HCC) 12/12/2021   Raynaud disease    Past Surgical History:  Procedure Laterality Date   EYE SURGERY     Social History   Socioeconomic History   Marital status: Widowed    Spouse name: Not on file   Number of children: Not on file   Years of  education: Not on file   Highest education level: Professional school degree (e.g., MD, DDS, DVM, JD)  Occupational History   Not on file  Tobacco Use   Smoking status: Never   Smokeless tobacco: Not on file  Vaping Use   Vaping status: Never Used  Substance and Sexual Activity   Alcohol use: Yes    Alcohol/week: 10.0 standard drinks of alcohol    Types: 10 Glasses of wine per week   Drug use: Not Currently   Sexual activity: Not Currently  Other Topics Concern   Not on file  Social History Narrative   Not on file   Social Determinants of Health   Financial Resource Strain: Medium Risk (02/02/2023)   Overall Financial Resource Strain (CARDIA)    Difficulty of Paying Living Expenses: Somewhat hard  Food Insecurity: No Food Insecurity (02/02/2023)   Hunger Vital Sign    Worried About Running Out of Food in the Last Year: Never true    Ran Out of Food in the Last Year: Never true  Transportation Needs: No Transportation Needs (02/02/2023)   PRAPARE - Administrator, Civil Service (Medical): No    Lack of Transportation (Non-Medical): No  Physical Activity: Sufficiently Active (02/02/2023)   Exercise Vital Sign    Days of Exercise per Week: 4 days  Minutes of Exercise per Session: 70 min  Stress: No Stress Concern Present (02/02/2023)   [employer] of Occupational Health - Occupational Stress Questionnaire    Feeling of Stress : Not at all  Social Connections: Moderately Isolated (02/02/2023)   Social Connection and Isolation Panel [NHANES]    Frequency of Communication with Friends and Family: Three times a week    Frequency of Social Gatherings with Friends and Family: Three times a week    Attends Religious Services: Never    Active Member of Clubs or Organizations: Yes    Attends Banker Meetings: More than 4 times per year    Marital Status: Widowed   Allergies  Allergen Reactions   Penicillins Hives and Rash        Penicillin V  Potassium Other (See Comments)   Family History  Problem Relation Age of Onset   Alcohol abuse Mother    Arthritis Mother    Diabetes Mother    Mental retardation Mother    Alcohol abuse Father    Hypertension Father    Cancer Sister    Depression Sister    Cancer Sister    Arthritis Maternal Grandmother    Parkinson's disease Maternal Grandfather      Current Outpatient Medications (Cardiovascular):    atorvastatin (LIPITOR) 10 MG tablet, Take 1 tablet (10 mg total) by mouth daily.   hydrochlorothiazide (MICROZIDE) 12.5 MG capsule, TAKE ONE CAPSULE BY MOUTH ONE TIME DAILY   losartan (COZAAR) 50 MG tablet, TAKE ONE TABLET BY MOUTH ONE TIME DAILY   tadalafil (CIALIS) 5 MG tablet, Take 1 tablet (5 mg total) by mouth daily.   Current Outpatient Medications (Analgesics):    acetaminophen (TYLENOL) 500 MG tablet, Take 2 tablets (1,000 mg total) by mouth every 8 (eight) hours as needed.  Current Outpatient Medications (Hematological):    folic acid (FOLVITE) 1 MG tablet, Take 1 tablet (1 mg total) by mouth daily.   warfarin (COUMADIN) 5 MG tablet, Take 1-2 tablets (5-10 mg total) by mouth daily. Take 5mg  by mouth for two days. On 3rd day, take 10mg . Then repeat.  Current Outpatient Medications (Other):    cholecalciferol (VITAMIN D3) 25 MCG (1000 UNIT) tablet, Take 1,000 Units by mouth daily.   famotidine (PEPCID) 20 MG tablet, Take 1 tablet (20 mg total) by mouth 2 (two) times daily.   gabapentin (NEURONTIN) 100 MG capsule, Take 2 capsules (200 mg total) by mouth at bedtime.   Multiple Vitamin (MULTIVITAMIN WITH MINERALS) TABS tablet, Take 1 tablet by mouth daily.   thiamine (VITAMIN B-1) 100 MG tablet, Take 1 tablet (100 mg total) by mouth daily.    Review of Systems:  No headache, visual changes, nausea, vomiting, diarrhea, constipation, dizziness, abdominal pain, skin rash, fevers, chills, night sweats, weight loss, swollen lymph nodes, body aches, joint swelling, chest pain,  shortness of breath, mood changes. POSITIVE muscle aches  Objective  Blood pressure 126/76, pulse 86, height 5\' 11"  (1.803 m), weight 180 lb (81.6 kg), SpO2 96%.   General: No apparent distress alert and oriented x3 mood and affect normal, dressed appropriately.  HEENT: Pupils equal, extraocular movements intact  Respiratory: Patient's speak in full sentences and does not appear short of breath  Cardiovascular: No lower extremity edema, non tender, no erythema  Able to ambulate without any significant difficulties.  Comfortable sitting in the chair.  Left knee only trace amount of swelling noted.    Impression and Recommendations:    The above documentation  has been reviewed and is accurate and complete Judi Saa, DO

## 2023-06-14 ENCOUNTER — Ambulatory Visit (INDEPENDENT_AMBULATORY_CARE_PROVIDER_SITE_OTHER): Payer: Medicare Other | Admitting: Family Medicine

## 2023-06-14 VITALS — BP 126/76 | HR 86 | Ht 71.0 in | Wt 180.0 lb

## 2023-06-14 DIAGNOSIS — M1712 Unilateral primary osteoarthritis, left knee: Secondary | ICD-10-CM

## 2023-06-14 DIAGNOSIS — M5136 Other intervertebral disc degeneration, lumbar region: Secondary | ICD-10-CM

## 2023-06-14 NOTE — Patient Instructions (Signed)
Good to see you Glad everything is stable Ice 2x a day Will refill gabapentin when needed See me again in 6 months

## 2023-06-14 NOTE — Assessment & Plan Note (Signed)
Stable at the moment, seems to be doing well with treatment at the moment.  We discussed with patient about icing regimen and home exercises otherwise.  No other significant changes at this time.  Follow-up with me again in 12 weeks

## 2023-06-14 NOTE — Assessment & Plan Note (Signed)
Stable, no changes.  We discussed continuing to work on [employer].  Increase activity slowly.

## 2023-06-29 ENCOUNTER — Inpatient Hospital Stay: Payer: Medicare Other | Attending: Family

## 2023-06-29 ENCOUNTER — Other Ambulatory Visit: Payer: Self-pay

## 2023-06-29 ENCOUNTER — Telehealth: Payer: Self-pay

## 2023-06-29 DIAGNOSIS — I82502 Chronic embolism and thrombosis of unspecified deep veins of left lower extremity: Secondary | ICD-10-CM

## 2023-06-29 DIAGNOSIS — Z7901 Long term (current) use of anticoagulants: Secondary | ICD-10-CM | POA: Insufficient documentation

## 2023-06-29 DIAGNOSIS — Z86718 Personal history of other venous thrombosis and embolism: Secondary | ICD-10-CM | POA: Diagnosis present

## 2023-06-29 DIAGNOSIS — I82402 Acute embolism and thrombosis of unspecified deep veins of left lower extremity: Secondary | ICD-10-CM

## 2023-06-29 LAB — PROTIME-INR
INR: 3.2 — ABNORMAL HIGH (ref 0.8–1.2)
Prothrombin Time: 33 seconds — ABNORMAL HIGH (ref 11.4–15.2)

## 2023-06-29 LAB — CMP (CANCER CENTER ONLY)
ALT: 18 U/L (ref 0–44)
AST: 25 U/L (ref 15–41)
Albumin: 4.2 g/dL (ref 3.5–5.0)
Alkaline Phosphatase: 103 U/L (ref 38–126)
Anion gap: 9 (ref 5–15)
BUN: 17 mg/dL (ref 8–23)
CO2: 28 mmol/L (ref 22–32)
Calcium: 9.2 mg/dL (ref 8.9–10.3)
Chloride: 103 mmol/L (ref 98–111)
Creatinine: 0.94 mg/dL (ref 0.61–1.24)
GFR, Estimated: >60 mL/min (ref >60)
Glucose, Bld: 133 mg/dL — ABNORMAL HIGH (ref 70–99)
Potassium: 3.9 mmol/L (ref 3.5–5.1)
Sodium: 140 mmol/L (ref 135–145)
Total Bilirubin: 1 mg/dL (ref 0.3–1.2)
Total Protein: 7.1 g/dL (ref 6.5–8.1)

## 2023-06-29 LAB — CBC WITH DIFFERENTIAL (CANCER CENTER ONLY)
Abs Immature Granulocytes: 0.02 10*3/uL (ref 0.00–0.07)
Basophils Absolute: 0 10*3/uL (ref 0.0–0.1)
Basophils Relative: 1 %
Eosinophils Absolute: 0.1 10*3/uL (ref 0.0–0.5)
Eosinophils Relative: 2 %
HCT: 46 % (ref 39.0–52.0)
Hemoglobin: 15.2 g/dL (ref 13.0–17.0)
Immature Granulocytes: 0 %
Lymphocytes Relative: 33 %
Lymphs Abs: 2.2 10*3/uL (ref 0.7–4.0)
MCH: 32.1 pg (ref 26.0–34.0)
MCHC: 33 g/dL (ref 30.0–36.0)
MCV: 97.3 fL (ref 80.0–100.0)
Monocytes Absolute: 0.5 10*3/uL (ref 0.1–1.0)
Monocytes Relative: 8 %
Neutro Abs: 3.7 10*3/uL (ref 1.7–7.7)
Neutrophils Relative %: 56 %
Platelet Count: 178 10*3/uL (ref 150–400)
RBC: 4.73 MIL/uL (ref 4.22–5.81)
RDW: 13.3 % (ref 11.5–15.5)
WBC Count: 6.6 10*3/uL (ref 4.0–10.5)
nRBC: 0 % (ref 0.0–0.2)

## 2023-06-29 NOTE — Telephone Encounter (Signed)
Advised via MyChart.

## 2023-06-29 NOTE — Telephone Encounter (Signed)
-----   Message from Eileen Stanford sent at 06/29/2023  2:21 PM EDT ----- INR therapeutic, no change to his Coumadin. ----- Message ----- From: Leory Plowman, [facility] [place] Sent: 06/29/2023   1:19 PM EDT To: Erenest Blank, NP

## 2023-07-20 ENCOUNTER — Inpatient Hospital Stay: Payer: Medicare Other | Attending: Family

## 2023-07-20 DIAGNOSIS — I82412 Acute embolism and thrombosis of left femoral vein: Secondary | ICD-10-CM | POA: Diagnosis not present

## 2023-07-20 DIAGNOSIS — I82502 Chronic embolism and thrombosis of unspecified deep veins of left lower extremity: Secondary | ICD-10-CM

## 2023-07-20 DIAGNOSIS — Z86718 Personal history of other venous thrombosis and embolism: Secondary | ICD-10-CM

## 2023-07-20 DIAGNOSIS — I82432 Acute embolism and thrombosis of left popliteal vein: Secondary | ICD-10-CM | POA: Insufficient documentation

## 2023-07-20 LAB — CBC WITH DIFFERENTIAL (CANCER CENTER ONLY)
Abs Immature Granulocytes: 0.02 10*3/uL (ref 0.00–0.07)
Basophils Absolute: 0 10*3/uL (ref 0.0–0.1)
Basophils Relative: 1 %
Eosinophils Absolute: 0.1 10*3/uL (ref 0.0–0.5)
Eosinophils Relative: 1 %
HCT: 47 % (ref 39.0–52.0)
Hemoglobin: 15.3 g/dL (ref 13.0–17.0)
Immature Granulocytes: 0 %
Lymphocytes Relative: 32 %
Lymphs Abs: 2 10*3/uL (ref 0.7–4.0)
MCH: 32.1 pg (ref 26.0–34.0)
MCHC: 32.6 g/dL (ref 30.0–36.0)
MCV: 98.5 fL (ref 80.0–100.0)
Monocytes Absolute: 0.6 10*3/uL (ref 0.1–1.0)
Monocytes Relative: 10 %
Neutro Abs: 3.6 10*3/uL (ref 1.7–7.7)
Neutrophils Relative %: 56 %
Platelet Count: 214 10*3/uL (ref 150–400)
RBC: 4.77 MIL/uL (ref 4.22–5.81)
RDW: 13.4 % (ref 11.5–15.5)
WBC Count: 6.4 10*3/uL (ref 4.0–10.5)
nRBC: 0 % (ref 0.0–0.2)

## 2023-07-20 LAB — PROTIME-INR
INR: 2.7 — ABNORMAL HIGH (ref 0.8–1.2)
Prothrombin Time: 28.9 s — ABNORMAL HIGH (ref 11.4–15.2)

## 2023-07-27 ENCOUNTER — Other Ambulatory Visit: Payer: Medicare Other

## 2023-09-03 ENCOUNTER — Telehealth: Payer: Self-pay | Admitting: Medical Oncology

## 2023-09-03 NOTE — Telephone Encounter (Signed)
due to maternity leave. i left another detailed vm for pt regarding need for rescheduling appts on friday 10/25. provided name and call back numeber *4th vm left for pt*

## 2023-09-07 ENCOUNTER — Ambulatory Visit: Payer: Medicare Other | Admitting: Family

## 2023-09-07 ENCOUNTER — Other Ambulatory Visit: Payer: Medicare Other

## 2023-09-11 ENCOUNTER — Inpatient Hospital Stay (HOSPITAL_BASED_OUTPATIENT_CLINIC_OR_DEPARTMENT_OTHER): Payer: Medicare Other | Admitting: Medical Oncology

## 2023-09-11 ENCOUNTER — Other Ambulatory Visit: Payer: Self-pay

## 2023-09-11 ENCOUNTER — Inpatient Hospital Stay: Payer: Medicare Other | Attending: Family

## 2023-09-11 ENCOUNTER — Other Ambulatory Visit: Payer: Self-pay | Admitting: *Deleted

## 2023-09-11 ENCOUNTER — Encounter: Payer: Self-pay | Admitting: Medical Oncology

## 2023-09-11 ENCOUNTER — Telehealth: Payer: Self-pay | Admitting: *Deleted

## 2023-09-11 VITALS — BP 122/76 | HR 68 | Temp 98.0°F | Resp 18 | Ht 71.0 in | Wt 182.1 lb

## 2023-09-11 DIAGNOSIS — I82402 Acute embolism and thrombosis of unspecified deep veins of left lower extremity: Secondary | ICD-10-CM

## 2023-09-11 DIAGNOSIS — Z86718 Personal history of other venous thrombosis and embolism: Secondary | ICD-10-CM | POA: Diagnosis not present

## 2023-09-11 DIAGNOSIS — I82502 Chronic embolism and thrombosis of unspecified deep veins of left lower extremity: Secondary | ICD-10-CM

## 2023-09-11 LAB — CBC WITH DIFFERENTIAL (CANCER CENTER ONLY)
Abs Immature Granulocytes: 0.01 10*3/uL (ref 0.00–0.07)
Basophils Absolute: 0 10*3/uL (ref 0.0–0.1)
Basophils Relative: 0 %
Eosinophils Absolute: 0.1 10*3/uL (ref 0.0–0.5)
Eosinophils Relative: 2 %
HCT: 43.7 % (ref 39.0–52.0)
Hemoglobin: 14.6 g/dL (ref 13.0–17.0)
Immature Granulocytes: 0 %
Lymphocytes Relative: 34 %
Lymphs Abs: 2.5 10*3/uL (ref 0.7–4.0)
MCH: 32.4 pg (ref 26.0–34.0)
MCHC: 33.4 g/dL (ref 30.0–36.0)
MCV: 96.9 fL (ref 80.0–100.0)
Monocytes Absolute: 0.5 10*3/uL (ref 0.1–1.0)
Monocytes Relative: 7 %
Neutro Abs: 4.1 10*3/uL (ref 1.7–7.7)
Neutrophils Relative %: 57 %
Platelet Count: 197 10*3/uL (ref 150–400)
RBC: 4.51 MIL/uL (ref 4.22–5.81)
RDW: 13.2 % (ref 11.5–15.5)
WBC Count: 7.2 10*3/uL (ref 4.0–10.5)
nRBC: 0 % (ref 0.0–0.2)

## 2023-09-11 LAB — PROTIME-INR
INR: 1.8 — ABNORMAL HIGH (ref 0.8–1.2)
Prothrombin Time: 21.1 s — ABNORMAL HIGH (ref 11.4–15.2)

## 2023-09-11 NOTE — Telephone Encounter (Signed)
Per Melody Haver, please tell patient that his INR is low today at 1.8. Have him take Coumadin 10 mg today, and then back to his normal schedule tomorrow. Repeat labs in one week. Patient verbalized understanding and has enough Coumadin. LOS sent to scheduling.

## 2023-09-11 NOTE — Progress Notes (Signed)
Hematology and Oncology Follow Up Visit  Raymond Steiner Sr. [id number] Feb 06, 1951 72 y.o. 09/11/2023   Principle Diagnosis:  Thrombus of left femoral vein down to left popliteal vein --idiopathic   Current Therapy:        Pradaxa 150 mg po BID -- started on 05/16/2022 d/c'd Coumadin adjusted to maintain INR of 2.5-3.5   Interim History:  Raymond [name] is here today for follow-up.   He has been doing ok. He recently lost one of his dogs.  Has been taking his coumadin 10 mg every 5th day and 5 mg all other days.  No blood loss noted. No bruising or petechiae.  He has a rash across both eyes that came out after he dog sat for his daughter. He has a follow-up later today with PCP for follow-up and treatment.  No fever, chills, n/v, cough, dizziness, SOB, chest pain, palpitations, abdominal pain or changes in bowel or bladder habits.  No numbness or tingling in his extremities.  No new falls, no syncope reported.  He has poison ivy on the left hand which has been hard for his tennis game.  Appetite and hydration are good.  Wt Readings from Last 3 Encounters:  09/11/23 182 lb 1.9 oz (82.6 kg)  06/14/23 180 lb (81.6 kg)  05/16/23 180 lb (81.6 kg)     ECOG Performance Status: 1 - Symptomatic but completely ambulatory  Medications:  Allergies as of 09/11/2023       Reactions   Penicillins Hives, Rash      Penicillin V Potassium Other (See Comments)        Medication List        Accurate as of September 11, 2023  3:45 PM. If you have any questions, ask your nurse or doctor.          STOP taking these medications    famotidine 20 MG tablet Commonly known as: Pepcid Stopped by: Rushie Chestnut   folic acid 1 MG tablet Commonly known as: FOLVITE Stopped by: Rushie Chestnut   tadalafil 5 MG tablet Commonly known as: CIALIS Stopped by: Rushie Chestnut       TAKE these medications    acetaminophen 500 MG tablet Commonly known as: TYLENOL Take 2  tablets (1,000 mg total) by mouth every 8 (eight) hours as needed.   atorvastatin 10 MG tablet Commonly known as: LIPITOR Take 1 tablet (10 mg total) by mouth daily.   cholecalciferol 25 MCG (1000 UNIT) tablet Commonly known as: VITAMIN D3 Take 1,000 Units by mouth daily.   gabapentin 100 MG capsule Commonly known as: NEURONTIN Take 2 capsules (200 mg total) by mouth at bedtime.   hydrochlorothiazide 12.5 MG capsule Commonly known as: MICROZIDE TAKE ONE CAPSULE BY MOUTH ONE TIME DAILY   losartan 50 MG tablet Commonly known as: COZAAR TAKE ONE TABLET BY MOUTH ONE TIME DAILY   multivitamin with minerals Tabs tablet Take 1 tablet by mouth daily.   thiamine 100 MG tablet Commonly known as: Vitamin B-1 Take 1 tablet (100 mg total) by mouth daily.   warfarin 5 MG tablet Commonly known as: COUMADIN Take 1-2 tablets (5-10 mg total) by mouth daily. Take 5mg  by mouth for two days. On 3rd day, take 10mg . Then repeat.        Allergies:  Allergies  Allergen Reactions   Penicillins Hives and Rash        Penicillin V Potassium Other (See Comments)    Past Medical History, Surgical history,  Social history, and Family History were reviewed and updated.  Review of Systems: All other 10 point review of systems is negative.   Physical Exam:  height is 5\' 11"  (1.803 m) and weight is 182 lb 1.9 oz (82.6 kg). His oral temperature is 98 F (36.7 C). His blood pressure is 122/76 and his pulse is 68. His respiration is 18 and oxygen saturation is 100%.   Wt Readings from Last 3 Encounters:  09/11/23 182 lb 1.9 oz (82.6 kg)  06/14/23 180 lb (81.6 kg)  05/16/23 180 lb (81.6 kg)    Ocular: Sclerae unicteric, pupils equal, round and reactive to light Ear-nose-throat: Oropharynx clear, dentition fair Lymphatic: No cervical or supraclavicular adenopathy Lungs no rales or rhonchi, good excursion bilaterally Heart regular rate and rhythm, no murmur appreciated Abd soft, nontender,  positive bowel sounds MSK no focal spinal tenderness, no joint edema Neuro: non-focal, well-oriented, appropriate affect  Lab Results  Component Value Date   WBC 7.2 09/11/2023   HGB 14.6 09/11/2023   HCT 43.7 09/11/2023   MCV 96.9 09/11/2023   PLT 197 09/11/2023   No results found for: "FERRITIN", "IRON", "TIBC", "UIBC", "IRONPCTSAT" Lab Results  Component Value Date   RBC 4.51 09/11/2023   No results found for: "KPAFRELGTCHN", "LAMBDASER", "KAPLAMBRATIO" No results found for: "IGGSERUM", "IGA", "IGMSERUM" No results found for: "TOTALPROTELP", "ALBUMINELP", "A1GS", "A2GS", "BETS", "BETA2SER", "GAMS", "MSPIKE", "SPEI"   Chemistry      Component Value Date/Time   NA 140 06/29/2023 1304   K 3.9 06/29/2023 1304   CL 103 06/29/2023 1304   CO2 28 06/29/2023 1304   BUN 17 06/29/2023 1304   CREATININE 0.94 06/29/2023 1304      Component Value Date/Time   CALCIUM 9.2 06/29/2023 1304   ALKPHOS 103 06/29/2023 1304   AST 25 06/29/2023 1304   ALT 18 06/29/2023 1304   BILITOT 1.0 06/29/2023 1304     Encounter Diagnosis  Name Primary?   History of recurrent deep vein thrombosis (DVT) Yes   Impression and Plan: Raymond [name] is a very pleasant 72 yo gentleman with left lower extremity DVT felt to be idiopathic.   He will take 10 mg today and then continue the regimen that has worked well for him for months. I would recommend lab recheck in 1 week.  INR recheck in 1 week, then monthly RTC 4 months carter, labs (CBC, CMP, INR)  Rushie Chestnut, PA-C 10/29/20243:45 PM

## 2023-09-18 ENCOUNTER — Inpatient Hospital Stay: Payer: Medicare Other | Attending: Family

## 2023-09-18 DIAGNOSIS — I82502 Chronic embolism and thrombosis of unspecified deep veins of left lower extremity: Secondary | ICD-10-CM

## 2023-09-18 DIAGNOSIS — Z86718 Personal history of other venous thrombosis and embolism: Secondary | ICD-10-CM | POA: Insufficient documentation

## 2023-09-18 LAB — PROTIME-INR
INR: 1.9 — ABNORMAL HIGH (ref 0.8–1.2)
Prothrombin Time: 22.2 s — ABNORMAL HIGH (ref 11.4–15.2)

## 2023-09-20 DIAGNOSIS — Z85828 Personal history of other malignant neoplasm of skin: Secondary | ICD-10-CM | POA: Diagnosis not present

## 2023-09-20 DIAGNOSIS — D1801 Hemangioma of skin and subcutaneous tissue: Secondary | ICD-10-CM | POA: Diagnosis not present

## 2023-09-20 DIAGNOSIS — L570 Actinic keratosis: Secondary | ICD-10-CM | POA: Diagnosis not present

## 2023-09-20 DIAGNOSIS — L814 Other melanin hyperpigmentation: Secondary | ICD-10-CM | POA: Diagnosis not present

## 2023-09-20 DIAGNOSIS — L821 Other seborrheic keratosis: Secondary | ICD-10-CM | POA: Diagnosis not present

## 2023-10-16 ENCOUNTER — Other Ambulatory Visit: Payer: Self-pay

## 2023-10-16 DIAGNOSIS — I82502 Chronic embolism and thrombosis of unspecified deep veins of left lower extremity: Secondary | ICD-10-CM

## 2023-10-16 MED ORDER — WARFARIN SODIUM 5 MG PO TABS: 5.0000 mg | ORAL_TABLET | Freq: Every day | ORAL | 3 refills | Status: DC

## 2023-10-22 ENCOUNTER — Telehealth: Payer: Self-pay | Admitting: Family Medicine

## 2023-10-22 NOTE — Telephone Encounter (Signed)
Patient called requesting a refill on: gabapentin (NEURONTIN) 100 MG capsule   Pharmacy: [facility]

## 2023-10-23 ENCOUNTER — Other Ambulatory Visit: Payer: Self-pay

## 2023-10-23 MED ORDER — GABAPENTIN 100 MG PO CAPS: 200.0000 mg | ORAL_CAPSULE | Freq: Every day | ORAL | 0 refills | Status: AC

## 2023-10-25 ENCOUNTER — Other Ambulatory Visit: Payer: Self-pay | Admitting: Family

## 2023-11-19 ENCOUNTER — Ambulatory Visit: Payer: Medicare Other | Admitting: Urology

## 2023-12-06 ENCOUNTER — Ambulatory Visit: Payer: Medicare Other | Admitting: Urology

## 2023-12-10 DIAGNOSIS — B880 Other acariasis: Secondary | ICD-10-CM | POA: Diagnosis not present

## 2023-12-10 DIAGNOSIS — H04123 Dry eye syndrome of bilateral lacrimal glands: Secondary | ICD-10-CM | POA: Diagnosis not present

## 2023-12-10 DIAGNOSIS — H02055 Trichiasis without entropian left lower eyelid: Secondary | ICD-10-CM | POA: Diagnosis not present

## 2023-12-10 DIAGNOSIS — H01001 Unspecified blepharitis right upper eyelid: Secondary | ICD-10-CM | POA: Diagnosis not present

## 2023-12-10 DIAGNOSIS — H01004 Unspecified blepharitis left upper eyelid: Secondary | ICD-10-CM | POA: Diagnosis not present

## 2023-12-10 DIAGNOSIS — D3132 Benign neoplasm of left choroid: Secondary | ICD-10-CM | POA: Diagnosis not present

## 2023-12-14 NOTE — Progress Notes (Deleted)
 [facility] [address] [place] [address] Phone: [phone] Subjective:    I'm seeing this patient by the request  of:  Jason Leita Repine, FNP  CC:   YEP:[id number]  06/14/2023 Stable, no changes.  We discussed continuing to work on [employer].  Increase activity slowly.     Stable at the moment, seems to be doing well with treatment at the moment.  We discussed with patient about icing regimen and home exercises otherwise.  No other significant changes at this time.  Follow-up with me again in 12 weeks     Updated 12/18/2023 Raymond Mathew Drones Sr. is a 73 y.o. male coming in with complaint of L knee and LBP       Past Medical History:  Diagnosis Date   Allergy penicillin   DDD (degenerative disc disease), lumbar    DVT (deep venous thrombosis) (HCC)    GERD (gastroesophageal reflux disease)    Hyperlipidemia    Hypertension    Leg DVT (deep venous thromboembolism), chronic, left (HCC) 12/12/2021   Raynaud disease    Past Surgical History:  Procedure Laterality Date   EYE SURGERY     Social History   Socioeconomic History   Marital status: Widowed    Spouse name: Not on file   Number of children: Not on file   Years of education: Not on file   Highest education level: Professional school degree (e.g., MD, DDS, DVM, JD)  Occupational History   Not on file  Tobacco Use   Smoking status: Never   Smokeless tobacco: Not on file  Vaping Use   Vaping status: Never Used  Substance and Sexual Activity   Alcohol use: Yes    Alcohol/week: 10.0 standard drinks of alcohol    Types: 10 Glasses of wine per week   Drug use: Not Currently   Sexual activity: Not Currently  Other Topics Concern   Not on file  Social History Narrative   Not on file   Social Drivers of Health   Financial Resource Strain: Medium Risk (02/02/2023)   Overall Financial Resource Strain (CARDIA)    Difficulty of Paying Living Expenses:  Somewhat hard  Food Insecurity: No Food Insecurity (02/02/2023)   Hunger Vital Sign    Worried About Running Out of Food in the Last Year: Never true    Ran Out of Food in the Last Year: Never true  Transportation Needs: No Transportation Needs (02/02/2023)   PRAPARE - Administrator, Civil Service (Medical): No    Lack of Transportation (Non-Medical): No  Physical Activity: Sufficiently Active (02/02/2023)   Exercise Vital Sign    Days of Exercise per Week: 4 days    Minutes of Exercise per Session: 70 min  Stress: No Stress Concern Present (02/02/2023)   [employer] of Occupational Health - Occupational Stress Questionnaire    Feeling of Stress : Not at all  Social Connections: Moderately Isolated (02/02/2023)   Social Connection and Isolation Panel [NHANES]    Frequency of Communication with Friends and Family: Three times a week    Frequency of Social Gatherings with Friends and Family: Three times a week    Attends Religious Services: Never    Active Member of Clubs or Organizations: Yes    Attends Banker Meetings: More than 4 times per year    Marital Status: Widowed   Allergies  Allergen Reactions   Penicillins Hives and Rash  Penicillin V Potassium Other (See Comments)   Family History  Problem Relation Age of Onset   Alcohol abuse Mother    Arthritis Mother    Diabetes Mother    Mental retardation Mother    Alcohol abuse Father    Hypertension Father    Cancer Sister    Depression Sister    Cancer Sister    Arthritis Maternal Grandmother    Parkinson's disease Maternal Grandfather      Current Outpatient Medications (Cardiovascular):    atorvastatin  (LIPITOR) 10 MG tablet, Take 1 tablet (10 mg total) by mouth daily.   hydrochlorothiazide  (MICROZIDE ) 12.5 MG capsule, TAKE ONE CAPSULE BY MOUTH ONE TIME DAILY   losartan  (COZAAR ) 50 MG tablet, TAKE ONE TABLET BY MOUTH ONE TIME DAILY   Current Outpatient Medications  (Analgesics):    acetaminophen  (TYLENOL ) 500 MG tablet, Take 2 tablets (1,000 mg total) by mouth every 8 (eight) hours as needed.  Current Outpatient Medications (Hematological):    warfarin (COUMADIN ) 5 MG tablet, Take 1-2 tablets (5-10 mg total) by mouth daily. Take 5mg  by mouth for two days. On 3rd day, take 10mg . Then repeat.  Current Outpatient Medications (Other):    cholecalciferol (VITAMIN D3) 25 MCG (1000 UNIT) tablet, Take 1,000 Units by mouth daily.   gabapentin  (NEURONTIN ) 100 MG capsule, Take 2 capsules (200 mg total) by mouth at bedtime.   Multiple Vitamin (MULTIVITAMIN WITH MINERALS) TABS tablet, Take 1 tablet by mouth daily.   thiamine  (VITAMIN B-1) 100 MG tablet, Take 1 tablet (100 mg total) by mouth daily.   Reviewed prior external information including notes and imaging from  primary care provider As well as notes that were available from care everywhere and other healthcare systems.  Past medical history, social, surgical and family history all reviewed in electronic medical record.  No pertanent information unless stated regarding to the chief complaint.   Review of Systems:  No headache, visual changes, nausea, vomiting, diarrhea, constipation, dizziness, abdominal pain, skin rash, fevers, chills, night sweats, weight loss, swollen lymph nodes, body aches, joint swelling, chest pain, shortness of breath, mood changes. POSITIVE muscle aches  Objective  There were no vitals taken for this visit.   General: No apparent distress alert and oriented x3 mood and affect normal, dressed appropriately.  HEENT: Pupils equal, extraocular movements intact  Respiratory: Patient's speak in full sentences and does not appear short of breath  Cardiovascular: No lower extremity edema, non tender, no erythema      Impression and Recommendations:

## 2023-12-18 ENCOUNTER — Ambulatory Visit: Payer: Medicare Other | Admitting: Family Medicine

## 2023-12-26 NOTE — Progress Notes (Deleted)
 [facility] [address] [place] [address] Phone: [phone] Subjective:    I'm seeing this patient by the request  of:  Olive Bass, FNP  CC:   [id number]  06/14/2023 Stable, no changes. We discussed continuing to work on [employer]. Increase activity slowly.  Update 01/02/2024 Raymond Searles Sr. is a 73 y.o. male coming in with complaint of L knee and DDD of lumbar spine. Patient states        Past Medical History:  Diagnosis Date   Allergy penicillin   DDD (degenerative disc disease), lumbar    DVT (deep venous thrombosis) (HCC)    GERD (gastroesophageal reflux disease)    Hyperlipidemia    Hypertension    Leg DVT (deep venous thromboembolism), chronic, left (HCC) 12/12/2021   Raynaud disease    Past Surgical History:  Procedure Laterality Date   EYE SURGERY     Social History   Socioeconomic History   Marital status: Widowed    Spouse name: Not on file   Number of children: Not on file   Years of education: Not on file   Highest education level: Professional school degree (e.g., MD, DDS, DVM, JD)  Occupational History   Not on file  Tobacco Use   Smoking status: Never   Smokeless tobacco: Not on file  Vaping Use   Vaping status: Never Used  Substance and Sexual Activity   Alcohol use: Yes    Alcohol/week: 10.0 standard drinks of alcohol    Types: 10 Glasses of wine per week   Drug use: Not Currently   Sexual activity: Not Currently  Other Topics Concern   Not on file  Social History Narrative   Not on file   Social Drivers of Health   Financial Resource Strain: Medium Risk (02/02/2023)   Overall Financial Resource Strain (CARDIA)    Difficulty of Paying Living Expenses: Somewhat hard  Food Insecurity: No Food Insecurity (02/02/2023)   Hunger Vital Sign    Worried About Running Out of Food in the Last Year: Never true    Ran Out of Food in the Last Year: Never true  Transportation  Needs: No Transportation Needs (02/02/2023)   PRAPARE - Administrator, Civil Service (Medical): No    Lack of Transportation (Non-Medical): No  Physical Activity: Sufficiently Active (02/02/2023)   Exercise Vital Sign    Days of Exercise per Week: 4 days    Minutes of Exercise per Session: 70 min  Stress: No Stress Concern Present (02/02/2023)   [employer] of Occupational Health - Occupational Stress Questionnaire    Feeling of Stress : Not at all  Social Connections: Moderately Isolated (02/02/2023)   Social Connection and Isolation Panel [NHANES]    Frequency of Communication with Friends and Family: Three times a week    Frequency of Social Gatherings with Friends and Family: Three times a week    Attends Religious Services: Never    Active Member of Clubs or Organizations: Yes    Attends Banker Meetings: More than 4 times per year    Marital Status: Widowed   Allergies  Allergen Reactions   Penicillins Hives and Rash        Penicillin V Potassium Other (See Comments)   Family History  Problem Relation Age of Onset   Alcohol abuse Mother    Arthritis Mother    Diabetes Mother    Mental retardation Mother    Alcohol abuse  Father    Hypertension Father    Cancer Sister    Depression Sister    Cancer Sister    Arthritis Maternal Grandmother    Parkinson's disease Maternal Grandfather      Current Outpatient Medications (Cardiovascular):    atorvastatin (LIPITOR) 10 MG tablet, Take 1 tablet (10 mg total) by mouth daily.   hydrochlorothiazide (MICROZIDE) 12.5 MG capsule, TAKE ONE CAPSULE BY MOUTH ONE TIME DAILY   losartan (COZAAR) 50 MG tablet, TAKE ONE TABLET BY MOUTH ONE TIME DAILY   Current Outpatient Medications (Analgesics):    acetaminophen (TYLENOL) 500 MG tablet, Take 2 tablets (1,000 mg total) by mouth every 8 (eight) hours as needed.  Current Outpatient Medications (Hematological):    warfarin (COUMADIN) 5 MG tablet, Take 1-2  tablets (5-10 mg total) by mouth daily. Take 5mg  by mouth for two days. On 3rd day, take 10mg . Then repeat.  Current Outpatient Medications (Other):    cholecalciferol (VITAMIN D3) 25 MCG (1000 UNIT) tablet, Take 1,000 Units by mouth daily.   gabapentin (NEURONTIN) 100 MG capsule, Take 2 capsules (200 mg total) by mouth at bedtime.   Multiple Vitamin (MULTIVITAMIN WITH MINERALS) TABS tablet, Take 1 tablet by mouth daily.   thiamine (VITAMIN B-1) 100 MG tablet, Take 1 tablet (100 mg total) by mouth daily.   Reviewed prior external information including notes and imaging from  primary care provider As well as notes that were available from care everywhere and other healthcare systems.  Past medical history, social, surgical and family history all reviewed in electronic medical record.  No pertanent information unless stated regarding to the chief complaint.   Review of Systems:  No headache, visual changes, nausea, vomiting, diarrhea, constipation, dizziness, abdominal pain, skin rash, fevers, chills, night sweats, weight loss, swollen lymph nodes, body aches, joint swelling, chest pain, shortness of breath, mood changes. POSITIVE muscle aches  Objective  There were no vitals taken for this visit.   General: No apparent distress alert and oriented x3 mood and affect normal, dressed appropriately.  HEENT: Pupils equal, extraocular movements intact  Respiratory: Patient's speak in full sentences and does not appear short of breath  Cardiovascular: No lower extremity edema, non tender, no erythema      Impression and Recommendations:

## 2024-01-02 ENCOUNTER — Ambulatory Visit: Payer: Medicare Other | Admitting: Family Medicine

## 2024-01-09 NOTE — Progress Notes (Unsigned)
 [name] [address] [place] [address] Phone: [phone] Subjective:   Raymond [name] am a scribe for Dr. Katrinka Blazing.    I'm seeing this patient by the request  of:  Raymond Bass, FNP  CC: Neck pain follow-up  [id number]  06/14/2023 Stable, no changes.  We discussed continuing to work on [employer].  Increase activity slowly.     Stable at the moment, seems to be doing well with treatment at the moment.  We discussed with patient about icing regimen and home exercises otherwise.  No other significant changes at this time.  Follow-up with me again in 12 weeks   Update 01/11/2024 Raymond Orlick Sr. is a 73 y.o. male coming in with complaint of L knee and DDD lumbar. Patient states dealing with chronic nature of problem. Right shoulder has been giving him issue recently for the past month. Would like to get some shoulder exercises and not an evaluation today.       Past Medical History:  Diagnosis Date   Allergy penicillin   DDD (degenerative disc disease), lumbar    DVT (deep venous thrombosis) (HCC)    GERD (gastroesophageal reflux disease)    Hyperlipidemia    Hypertension    Leg DVT (deep venous thromboembolism), chronic, left (HCC) 12/12/2021   Raynaud disease    Past Surgical History:  Procedure Laterality Date   EYE SURGERY     Social History   Socioeconomic History   Marital status: Widowed    Spouse name: Not on file   Number of children: Not on file   Years of education: Not on file   Highest education level: Professional school degree (e.g., MD, DDS, DVM, JD)  Occupational History   Not on file  Tobacco Use   Smoking status: Never   Smokeless tobacco: Not on file  Vaping Use   Vaping status: Never Used  Substance and Sexual Activity   Alcohol use: Yes    Alcohol/week: 10.0 standard drinks of alcohol    Types: 10 Glasses of wine per week   Drug use: Not Currently   Sexual activity:  Not Currently  Other Topics Concern   Not on file  Social History Narrative   Not on file   Social Drivers of Health   Financial Resource Strain: Medium Risk (02/02/2023)   Overall Financial Resource Strain (CARDIA)    Difficulty of Paying Living Expenses: Somewhat hard  Food Insecurity: No Food Insecurity (02/02/2023)   Hunger Vital Sign    Worried About Running Out of Food in the Last Year: Never true    Ran Out of Food in the Last Year: Never true  Transportation Needs: No Transportation Needs (02/02/2023)   PRAPARE - Administrator, Civil Service (Medical): No    Lack of Transportation (Non-Medical): No  Physical Activity: Sufficiently Active (02/02/2023)   Exercise Vital Sign    Days of Exercise per Week: 4 days    Minutes of Exercise per Session: 70 min  Stress: No Stress Concern Present (02/02/2023)   [employer] of Occupational Health - Occupational Stress Questionnaire    Feeling of Stress : Not at all  Social Connections: Moderately Isolated (02/02/2023)   Social Connection and Isolation Panel [NHANES]    Frequency of Communication with Friends and Family: Three times a week    Frequency of Social Gatherings with Friends and Family: Three times a week    Attends Religious Services: Never  Active Member of Clubs or Organizations: Yes    Attends Banker Meetings: More than 4 times per year    Marital Status: Widowed   Allergies  Allergen Reactions   Penicillins Hives and Rash        Penicillin V Potassium Other (See Comments)   Family History  Problem Relation Age of Onset   Alcohol abuse Mother    Arthritis Mother    Diabetes Mother    Mental retardation Mother    Alcohol abuse Father    Hypertension Father    Cancer Sister    Depression Sister    Cancer Sister    Arthritis Maternal Grandmother    Parkinson's disease Maternal Grandfather      Current Outpatient Medications (Cardiovascular):    atorvastatin (LIPITOR) 10 MG  tablet, Take 1 tablet (10 mg total) by mouth daily.   hydrochlorothiazide (MICROZIDE) 12.5 MG capsule, TAKE ONE CAPSULE BY MOUTH ONE TIME DAILY   losartan (COZAAR) 50 MG tablet, TAKE ONE TABLET BY MOUTH ONE TIME DAILY   Current Outpatient Medications (Analgesics):    acetaminophen (TYLENOL) 500 MG tablet, Take 2 tablets (1,000 mg total) by mouth every 8 (eight) hours as needed.  Current Outpatient Medications (Hematological):    warfarin (COUMADIN) 5 MG tablet, Take 1-2 tablets (5-10 mg total) by mouth daily. Take 5mg  by mouth for two days. On 3rd day, take 10mg . Then repeat.  Current Outpatient Medications (Other):    cholecalciferol (VITAMIN D3) 25 MCG (1000 UNIT) tablet, Take 1,000 Units by mouth daily.   gabapentin (NEURONTIN) 100 MG capsule, Take 2 capsules (200 mg total) by mouth at bedtime.   Multiple Vitamin (MULTIVITAMIN WITH MINERALS) TABS tablet, Take 1 tablet by mouth daily.   thiamine (VITAMIN B-1) 100 MG tablet, Take 1 tablet (100 mg total) by mouth daily.   Reviewed prior external information including notes and imaging from  primary care provider As well as notes that were available from care everywhere and other healthcare systems.  Past medical history, social, surgical and family history all reviewed in electronic medical record.  No pertanent information unless stated regarding to the chief complaint.   Review of Systems:  No headache, visual changes, nausea, vomiting, diarrhea, constipation, dizziness, abdominal pain, skin rash, fevers, chills, night sweats, weight loss, swollen lymph nodes, body aches, joint swelling, chest pain, shortness of breath, mood changes. POSITIVE muscle aches  Objective  Blood pressure 130/70, pulse 72, height 5\' 11"  (1.803 m), weight 190 lb (86.2 kg), SpO2 94%.   General: No apparent distress alert and oriented x3 mood and affect normal, dressed appropriately.  HEENT: Pupils equal, extraocular movements intact  Respiratory: Patient's  speak in full sentences and does not appear short of breath  Cardiovascular: No lower extremity edema, non tender, no erythema  Knee exam shows arthritic changes noted but no significant instability noted, no significant swelling noted today.  Patient still has some crepitus noted.  Low back exam shows does have some loss lordosis noted.  Some tenderness to palpation noted as well.  Right shoulder does show positive impingement noted.  Some positive Neer and Hawkins.  Rotator cuff strength does appear to be intact.  Positive crossover noted.   [id number]; 15 additional minutes spent for Therapeutic exercises as stated in above notes.  This included exercises focusing on stretching, strengthening, with significant focus on eccentric aspects.   Long term goals include an improvement in range of motion, strength, endurance as well as avoiding reinjury. Patient's frequency would  include in 1-2 times a day, 3-5 times a week for a duration of 6-12 weeks. Shoulder Exercises that included:  Basic scapular stabilization to include adduction and depression of scapula Scaption, focusing on proper movement and good control Internal and External rotation utilizing a theraband, with elbow tucked at side entire time Rows with theraband   Proper technique shown and discussed handout in great detail with ATC.  All questions were discussed and answered.     Impression and Recommendations:     The above documentation has been reviewed and is accurate and complete Judi Saa, DO

## 2024-01-11 ENCOUNTER — Ambulatory Visit: Payer: Medicare Other | Admitting: Family Medicine

## 2024-01-11 ENCOUNTER — Encounter: Payer: Self-pay | Admitting: Family Medicine

## 2024-01-11 VITALS — BP 130/70 | HR 72 | Ht 71.0 in | Wt 190.0 lb

## 2024-01-11 DIAGNOSIS — M25511 Pain in right shoulder: Secondary | ICD-10-CM | POA: Diagnosis not present

## 2024-01-11 DIAGNOSIS — M5441 Lumbago with sciatica, right side: Secondary | ICD-10-CM

## 2024-01-11 DIAGNOSIS — M1712 Unilateral primary osteoarthritis, left knee: Secondary | ICD-10-CM

## 2024-01-11 DIAGNOSIS — G8929 Other chronic pain: Secondary | ICD-10-CM

## 2024-01-11 NOTE — Assessment & Plan Note (Signed)
 Discussed with patient at great length.  Hold on any injections.  Continue to stay active.  Follow-up again in 6 months

## 2024-01-11 NOTE — Patient Instructions (Addendum)
 Good to see you. Shoulder HEP. 3 times a week Go shoe shopping. It will help. Overall you are doing well.  Return in 6 months.

## 2024-01-11 NOTE — Assessment & Plan Note (Signed)
 Right shoulder pain.  Discussed with patient about the impingement noted.  Given exercises.  Discussed which activities to do and which ones to avoid.  Increase activity slowly.  Will follow-up again in 8 to 12 weeks

## 2024-01-11 NOTE — Assessment & Plan Note (Signed)
 Stable, is able to increase activity, continues to do relatively well.  Discussed icing regimen and home exercises.

## 2024-01-13 ENCOUNTER — Other Ambulatory Visit: Payer: Self-pay | Admitting: Family

## 2024-01-28 ENCOUNTER — Other Ambulatory Visit: Payer: Self-pay | Admitting: Family

## 2024-02-05 ENCOUNTER — Other Ambulatory Visit: Payer: Self-pay | Admitting: Family

## 2024-02-05 DIAGNOSIS — I82502 Chronic embolism and thrombosis of unspecified deep veins of left lower extremity: Secondary | ICD-10-CM

## 2024-02-05 DIAGNOSIS — Z86718 Personal history of other venous thrombosis and embolism: Secondary | ICD-10-CM

## 2024-02-05 DIAGNOSIS — I82402 Acute embolism and thrombosis of unspecified deep veins of left lower extremity: Secondary | ICD-10-CM

## 2024-02-06 ENCOUNTER — Inpatient Hospital Stay (HOSPITAL_BASED_OUTPATIENT_CLINIC_OR_DEPARTMENT_OTHER): Admitting: Family

## 2024-02-06 ENCOUNTER — Inpatient Hospital Stay: Attending: Hematology & Oncology

## 2024-02-06 VITALS — BP 136/87 | HR 80 | Temp 98.1°F | Resp 18 | Wt 186.8 lb

## 2024-02-06 DIAGNOSIS — Z7901 Long term (current) use of anticoagulants: Secondary | ICD-10-CM | POA: Insufficient documentation

## 2024-02-06 DIAGNOSIS — I82402 Acute embolism and thrombosis of unspecified deep veins of left lower extremity: Secondary | ICD-10-CM

## 2024-02-06 DIAGNOSIS — I82502 Chronic embolism and thrombosis of unspecified deep veins of left lower extremity: Secondary | ICD-10-CM | POA: Diagnosis not present

## 2024-02-06 DIAGNOSIS — Z86718 Personal history of other venous thrombosis and embolism: Secondary | ICD-10-CM | POA: Insufficient documentation

## 2024-02-06 LAB — CBC WITH DIFFERENTIAL (CANCER CENTER ONLY)
Abs Immature Granulocytes: 0.02 10*3/uL (ref 0.00–0.07)
Basophils Absolute: 0 10*3/uL (ref 0.0–0.1)
Basophils Relative: 0 %
Eosinophils Absolute: 0.1 10*3/uL (ref 0.0–0.5)
Eosinophils Relative: 2 %
HCT: 47.3 % (ref 39.0–52.0)
Hemoglobin: 16.1 g/dL (ref 13.0–17.0)
Immature Granulocytes: 0 %
Lymphocytes Relative: 32 %
Lymphs Abs: 2.3 10*3/uL (ref 0.7–4.0)
MCH: 32.9 pg (ref 26.0–34.0)
MCHC: 34 g/dL (ref 30.0–36.0)
MCV: 96.7 fL (ref 80.0–100.0)
Monocytes Absolute: 0.8 10*3/uL (ref 0.1–1.0)
Monocytes Relative: 11 %
Neutro Abs: 4 10*3/uL (ref 1.7–7.7)
Neutrophils Relative %: 55 %
Platelet Count: 197 10*3/uL (ref 150–400)
RBC: 4.89 MIL/uL (ref 4.22–5.81)
RDW: 13 % (ref 11.5–15.5)
WBC Count: 7.3 10*3/uL (ref 4.0–10.5)
nRBC: 0 % (ref 0.0–0.2)

## 2024-02-06 LAB — CMP (CANCER CENTER ONLY)
ALT: 14 U/L (ref 0–44)
AST: 23 U/L (ref 15–41)
Albumin: 4.5 g/dL (ref 3.5–5.0)
Alkaline Phosphatase: 116 U/L (ref 38–126)
Anion gap: 9 (ref 5–15)
BUN: 17 mg/dL (ref 8–23)
CO2: 26 mmol/L (ref 22–32)
Calcium: 9 mg/dL (ref 8.9–10.3)
Chloride: 103 mmol/L (ref 98–111)
Creatinine: 0.85 mg/dL (ref 0.61–1.24)
GFR, Estimated: >60 mL/min (ref >60)
Glucose, Bld: 85 mg/dL (ref 70–99)
Potassium: 4 mmol/L (ref 3.5–5.1)
Sodium: 138 mmol/L (ref 135–145)
Total Bilirubin: 1.3 mg/dL — ABNORMAL HIGH (ref 0.0–1.2)
Total Protein: 7.1 g/dL (ref 6.5–8.1)

## 2024-02-06 LAB — PROTIME-INR
INR: 3.8 — ABNORMAL HIGH (ref 0.8–1.2)
Prothrombin Time: 38 s — ABNORMAL HIGH (ref 11.4–15.2)

## 2024-02-06 LAB — LACTATE DEHYDROGENASE: LDH: 202 U/L — ABNORMAL HIGH (ref 98–192)

## 2024-02-06 NOTE — Progress Notes (Signed)
 Hematology and Oncology Follow Up Visit  Raymond Coronado Sr. [id number] Jan 12, 1951 73 y.o. 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 He is doing quite well but did have a fall out of bed recently. Thankfully he states that he was not seriously injured.  He did have some bruising on his arms that are healing nicely.  No other bruising, no abnormal blood loss, no petechiae.  INR today is stable at 3.8. He will continue same regimen with Coumadin. He is currently doing 5 mg, 5 mg, 10 mg rotation.  He enjoys playing tennis for exercise.  Mild SOB with over exertion which he feels is due to deconditioning.  No fever, chills, n/v, cough, rash, dizziness, chest pain, palpitations, abdominal pain or changes in bowel or bladder habits.  No swelling or tingling in his extremities.  No falls or syncope.  Appetite and hydration are good. Weight is stable at 186 lbs.   ECOG Performance Status: 1 - Symptomatic but completely ambulatory  Medications:  Allergies as of 02/06/2024       Reactions   Penicillins Hives, Rash      Penicillin V Potassium Other (See Comments)        Medication List        Accurate as of February 06, 2024 11:04 AM. If you have any questions, ask your nurse or doctor.          acetaminophen 500 MG tablet Commonly known as: TYLENOL Take 2 tablets (1,000 mg total) by mouth every 8 (eight) hours as needed.   atorvastatin 10 MG tablet Commonly known as: LIPITOR Take 1 tablet (10 mg total) by mouth daily.   cholecalciferol 25 MCG (1000 UNIT) tablet Commonly known as: VITAMIN D3 Take 1,000 Units by mouth daily.   gabapentin 100 MG capsule Commonly known as: NEURONTIN Take 2 capsules (200 mg total)  by mouth at bedtime.   hydrochlorothiazide 12.5 MG capsule Commonly known as: MICROZIDE Take 1 capsule (12.5 mg total) by mouth daily.   losartan 50 MG tablet Commonly known as: COZAAR TAKE ONE TABLET BY MOUTH ONE TIME DAILY   multivitamin with minerals Tabs tablet Take 1 tablet by mouth daily.   thiamine 100 MG tablet Commonly known as: Vitamin B-1 Take 1 tablet (100 mg total) by mouth daily.   warfarin 5 MG tablet Commonly known as: COUMADIN Take 1-2 tablets (5-10 mg total) by mouth daily. Take 5mg  by mouth for two days. On 3rd day, take 10mg . Then repeat.        Allergies:  Allergies  Allergen Reactions   Penicillins Hives and Rash        Penicillin V Potassium Other (See Comments)    Past Medical History, Surgical history, Social history, and Family History were reviewed and updated.  Review of Systems: All other 10 point review of systems is negative.   Physical Exam:  weight is 186 lb 12.8 oz (84.7 kg). His oral temperature is 98.1 F (36.7 C). His blood pressure is 136/87 and his pulse is 80. His respiration is 18 and oxygen saturation is 96%.   Wt Readings from Last 3 Encounters:  02/06/24 186 lb 12.8 oz (84.7 kg)  01/11/24 190 lb (  86.2 kg)  09/11/23 182 lb 1.9 oz (82.6 kg)    Ocular: Sclerae unicteric, pupils equal, round and reactive to light Ear-nose-throat: Oropharynx clear, dentition fair Lymphatic: No cervical or supraclavicular adenopathy Lungs no rales or rhonchi, good excursion bilaterally Heart regular rate and rhythm, no murmur appreciated Abd soft, nontender, positive bowel sounds MSK no focal spinal tenderness, no joint edema Neuro: non-focal, well-oriented, appropriate affect Breasts: Deferred   Lab Results  Component Value Date   WBC 7.3 02/06/2024   HGB 16.1 02/06/2024   HCT 47.3 02/06/2024   MCV 96.7 02/06/2024   PLT 197 02/06/2024   No results found for: "FERRITIN", "IRON", "TIBC", "UIBC", "IRONPCTSAT" Lab Results   Component Value Date   RBC 4.89 02/06/2024   No results found for: "KPAFRELGTCHN", "LAMBDASER", "KAPLAMBRATIO" No results found for: "IGGSERUM", "IGA", "IGMSERUM" No results found for: "TOTALPROTELP", "ALBUMINELP", "A1GS", "A2GS", "BETS", "BETA2SER", "GAMS", "MSPIKE", "SPEI"   Chemistry      Component Value Date/Time   NA 140 06/29/2023 1304   K 3.9 06/29/2023 1304   CL 103 06/29/2023 1304   CO2 28 06/29/2023 1304   BUN 17 06/29/2023 1304   CREATININE 0.94 06/29/2023 1304      Component Value Date/Time   CALCIUM 9.2 06/29/2023 1304   ALKPHOS 103 06/29/2023 1304   AST 25 06/29/2023 1304   ALT 18 06/29/2023 1304   BILITOT 1.0 06/29/2023 1304       Impression and Plan: Raymond [name] is a very pleasant 73 yo gentleman with left lower extremity DVT felt to be idiopathic.   INR 3.8. No changes to Coumadin regimen at this time.  Monthly INR check and follow-up in 4 months.   Eileen Stanford, NP 3/26/202511:04 AM

## 2024-02-07 ENCOUNTER — Ambulatory Visit (INDEPENDENT_AMBULATORY_CARE_PROVIDER_SITE_OTHER): Admitting: Family

## 2024-02-07 ENCOUNTER — Encounter: Payer: Self-pay | Admitting: Family

## 2024-02-07 VITALS — BP 132/80 | HR 63 | Temp 97.8°F | Ht 71.0 in | Wt 189.8 lb

## 2024-02-07 DIAGNOSIS — R221 Localized swelling, mass and lump, neck: Secondary | ICD-10-CM

## 2024-02-07 MED ORDER — DOXYCYCLINE HYCLATE 100 MG PO TABS: 100.0000 mg | ORAL_TABLET | Freq: Two times a day (BID) | ORAL | 0 refills | Status: DC

## 2024-02-07 NOTE — Progress Notes (Signed)
 Raymond Kersh Sr. is a 73 y.o. male with the following history as recorded in [employer]:  Patient Active Problem List   Diagnosis Date Noted   Right shoulder pain 01/11/2024   Microscopic hematuria 04/11/2023   BPH with obstruction/lower urinary tract symptoms 03/09/2023   Organic impotence 03/09/2023   Traumatic pneumothorax, initial encounter 01/29/2023   Pain of right lower extremity due to ischemia 05/01/2022   Leg DVT (deep venous thromboembolism), chronic, left (HCC) 12/12/2021   Mixed hyperlipidemia 11/21/2021   Low libido 11/21/2021   Low back pain 11/21/2021   Loss of appetite 11/21/2021   Hypertensive retinopathy 11/21/2021   Essential hypertension 11/21/2021   Chronic pain 11/21/2021   Enlarged prostate 06/20/2021   Degenerative disc disease, cervical 12/03/2020   Degenerative lumbar disc 09/27/2020   Degenerative arthritis of left knee 09/27/2020    Current Outpatient Medications  Medication Sig Dispense Refill   acetaminophen (TYLENOL) 500 MG tablet Take 2 tablets (1,000 mg total) by mouth every 8 (eight) hours as needed. 30 tablet 0   atorvastatin (LIPITOR) 10 MG tablet Take 1 tablet (10 mg total) by mouth daily. 90 tablet 0   cholecalciferol (VITAMIN D3) 25 MCG (1000 UNIT) tablet Take 1,000 Units by mouth daily.     doxycycline (VIBRA-TABS) 100 MG tablet Take 1 tablet (100 mg total) by mouth 2 (two) times daily. 14 tablet 0   gabapentin (NEURONTIN) 100 MG capsule Take 2 capsules (200 mg total) by mouth at bedtime. 180 capsule 0   hydrochlorothiazide (MICROZIDE) 12.5 MG capsule Take 1 capsule (12.5 mg total) by mouth daily. 30 capsule 0   losartan (COZAAR) 50 MG tablet TAKE ONE TABLET BY MOUTH ONE TIME DAILY 90 tablet 1   Multiple Vitamin (MULTIVITAMIN WITH MINERALS) TABS tablet Take 1 tablet by mouth daily.     thiamine (VITAMIN B-1) 100 MG tablet Take 1 tablet (100 mg total) by mouth daily. 30 tablet 0   warfarin (COUMADIN) 5 MG tablet Take 1-2 tablets (5-10  mg total) by mouth daily. Take 5mg  by mouth for two days. On 3rd day, take 10mg . Then repeat. 60 tablet 3   No current facility-administered medications for this visit.    Allergies: Penicillins and Penicillin v potassium  Past Medical History:  Diagnosis Date   Allergy penicillin   DDD (degenerative disc disease), lumbar    DVT (deep venous thrombosis) (HCC)    GERD (gastroesophageal reflux disease)    Hyperlipidemia    Hypertension    Leg DVT (deep venous thromboembolism), chronic, left (HCC) 12/12/2021   Raynaud disease     Past Surgical History:  Procedure Laterality Date   EYE SURGERY      Family History  Problem Relation Age of Onset   Alcohol abuse Mother    Arthritis Mother    Diabetes Mother    Mental retardation Mother    Alcohol abuse Father    Hypertension Father    Cancer Sister    Depression Sister    Cancer Sister    Arthritis Maternal Grandmother    Parkinson's disease Maternal Grandfather     Social History   Tobacco Use   Smoking status: Never   Smokeless tobacco: Not on file  Substance Use Topics   Alcohol use: Yes    Alcohol/week: 10.0 standard drinks of alcohol    Types: 10 Glasses of wine per week    Subjective:   "Lump" in neck x 3 years; has been monitoring and seems to be increasing in  the past 3-4 weeks; actually thought area was close to resolving in January and then area came back in February much larger than baseline; denies any pain or difficulty turning neck;    Objective:  Vitals:   02/07/24 0820  BP: 132/80  Pulse: 63  Temp: 97.8 F (36.6 C)  TempSrc: Oral  SpO2: 97%  Weight: 189 lb 12.8 oz (86.1 kg)  Height: 5\' 11"  (1.803 m)    General: Well developed, well nourished, in no acute distress  Skin : Warm and dry.  Head: Normocephalic and atraumatic  Eyes: Sclera and conjunctiva clear; pupils round and reactive to light; extraocular movements intact  Ears: External normal; canals clear; tympanic membranes normal   Oropharynx: Pink, supple. No suspicious lesions  Neck: Supple without thyromegaly, adenopathy; ? Sebaceous cyst on right side of neck Lungs: Respirations unlabored;  Neurologic: Alert and oriented; speech intact; face symmetrical; moves all extremities well; CNII-XII intact without focal deficit   Assessment:  1. Neck mass     Plan:  ? Sebacesous cyst vs lymphadenopathy; will update neck ultrasound; encouraged to apply warm compresses; Rx for Doxycycline 100 mg bid x 7 days; follow up to be determined;  Had normal CBC drawn with hematology yesterday; will hold on further labs today;   No follow-ups on file.  Orders Placed This Encounter  Procedures   US SOFT TISSUE HEAD & NECK (NON-THYROID)    Standing Status:   Future    Expiration Date:   02/06/2025    Reason for Exam (SYMPTOM  OR DIAGNOSIS REQUIRED):   mass right side of neck    Preferred imaging location?:   [facility]    Requested Prescriptions   Signed Prescriptions Disp Refills   doxycycline (VIBRA-TABS) 100 MG tablet 14 tablet 0    Sig: Take 1 tablet (100 mg total) by mouth 2 (two) times daily.

## 2024-02-12 ENCOUNTER — Ambulatory Visit (HOSPITAL_BASED_OUTPATIENT_CLINIC_OR_DEPARTMENT_OTHER)
Admission: RE | Admit: 2024-02-12 | Discharge: 2024-02-12 | Disposition: A | Discharge status: Home / Self Care | Source: Ambulatory Visit | Attending: Family | Admitting: Family

## 2024-02-12 DIAGNOSIS — R221 Localized swelling, mass and lump, neck: Secondary | ICD-10-CM | POA: Insufficient documentation

## 2024-02-15 ENCOUNTER — Ambulatory Visit (HOSPITAL_BASED_OUTPATIENT_CLINIC_OR_DEPARTMENT_OTHER)

## 2024-02-19 ENCOUNTER — Telehealth: Payer: Self-pay | Admitting: Emergency Medicine

## 2024-02-19 NOTE — Telephone Encounter (Signed)
 Copied from CRM [id number]. Topic: Clinical - Lab/Test Results >> Feb 19, 2024 11:54 AM Denese Killings wrote: Reason for CRM: Patient is requesting a callback regarding ultrasound results.

## 2024-02-21 NOTE — Telephone Encounter (Signed)
 Results not back yet

## 2024-02-26 NOTE — Telephone Encounter (Signed)
Results are back

## 2024-02-27 ENCOUNTER — Other Ambulatory Visit: Payer: Self-pay

## 2024-02-27 DIAGNOSIS — R9389 Abnormal findings on diagnostic imaging of other specified body structures: Secondary | ICD-10-CM

## 2024-02-27 NOTE — Telephone Encounter (Signed)
 Copied from CRM [id number]. Topic: General - Other >> Feb 27, 2024  4:36 PM Chuck Crater wrote: Reason for CRM: Patient is requesting a callback from [facility] in regards to referral to ENT.

## 2024-02-28 NOTE — Telephone Encounter (Signed)
 Called pt and left a VM asking pt to give the office a call back.

## 2024-02-28 NOTE — Telephone Encounter (Signed)
 Spoke with pt, pt states his "cyst popped" pt states it drained its self had some blood and pus. Pt also states he spoke with his sister who is a physician and was advised that he has a family history of sebaceous cyst and he would like to monitor the area for now instead of going to ENT. Advised pt these type of cyst do tend to "fill up" more than once until the head is removed, pt stated understanding and states he will continue to monitor and will reach back out to PCP if he notices the cyst returning.

## 2024-03-07 ENCOUNTER — Inpatient Hospital Stay: Attending: Family

## 2024-03-07 ENCOUNTER — Encounter: Payer: Self-pay | Admitting: *Deleted

## 2024-03-07 ENCOUNTER — Telehealth: Payer: Self-pay | Admitting: *Deleted

## 2024-03-07 ENCOUNTER — Telehealth: Payer: Self-pay | Admitting: Family

## 2024-03-07 DIAGNOSIS — I82502 Chronic embolism and thrombosis of unspecified deep veins of left lower extremity: Secondary | ICD-10-CM

## 2024-03-07 DIAGNOSIS — I82402 Acute embolism and thrombosis of unspecified deep veins of left lower extremity: Secondary | ICD-10-CM

## 2024-03-07 DIAGNOSIS — Z86718 Personal history of other venous thrombosis and embolism: Secondary | ICD-10-CM | POA: Diagnosis present

## 2024-03-07 LAB — PROTIME-INR
INR: 4.3 (ref 0.8–1.2)
Prothrombin Time: 41.6 s — ABNORMAL HIGH (ref 11.4–15.2)

## 2024-03-07 NOTE — Telephone Encounter (Signed)
 Kennard Pea NP notified of INR-4.3.  Orders received to hold Coumadin , today and take 5 mg daily, except for Friday when he is to take 10mg  and to recheck PT/INR in one week per order of S.  Hammans NP.  Call placed to patient and message left to notify pt of above. Instructed pt to call office back for directions of Coumadin  dosing.  MyChart message also sent.

## 2024-03-07 NOTE — Telephone Encounter (Signed)
 Per inbasket patient needs to return 1 week for lab only. LVM

## 2024-03-10 ENCOUNTER — Encounter (INDEPENDENT_AMBULATORY_CARE_PROVIDER_SITE_OTHER): Payer: Self-pay | Admitting: Otolaryngology

## 2024-03-12 DIAGNOSIS — H01001 Unspecified blepharitis right upper eyelid: Secondary | ICD-10-CM | POA: Diagnosis not present

## 2024-03-12 DIAGNOSIS — B880 Other acariasis: Secondary | ICD-10-CM | POA: Diagnosis not present

## 2024-03-12 DIAGNOSIS — H01004 Unspecified blepharitis left upper eyelid: Secondary | ICD-10-CM | POA: Diagnosis not present

## 2024-03-17 ENCOUNTER — Inpatient Hospital Stay: Attending: Family

## 2024-03-17 ENCOUNTER — Encounter: Payer: Self-pay | Admitting: *Deleted

## 2024-03-17 DIAGNOSIS — I82402 Acute embolism and thrombosis of unspecified deep veins of left lower extremity: Secondary | ICD-10-CM

## 2024-03-17 DIAGNOSIS — Z86718 Personal history of other venous thrombosis and embolism: Secondary | ICD-10-CM | POA: Diagnosis present

## 2024-03-17 DIAGNOSIS — I82502 Chronic embolism and thrombosis of unspecified deep veins of left lower extremity: Secondary | ICD-10-CM

## 2024-03-17 LAB — PROTIME-INR
INR: 2.4 — ABNORMAL HIGH (ref 0.8–1.2)
Prothrombin Time: 26 s — ABNORMAL HIGH (ref 11.4–15.2)

## 2024-03-18 ENCOUNTER — Other Ambulatory Visit: Payer: Self-pay | Admitting: Family

## 2024-03-20 DIAGNOSIS — L720 Epidermal cyst: Secondary | ICD-10-CM | POA: Diagnosis not present

## 2024-03-20 DIAGNOSIS — D1801 Hemangioma of skin and subcutaneous tissue: Secondary | ICD-10-CM | POA: Diagnosis not present

## 2024-03-20 DIAGNOSIS — C44319 Basal cell carcinoma of skin of other parts of face: Secondary | ICD-10-CM | POA: Diagnosis not present

## 2024-03-20 DIAGNOSIS — D3617 Benign neoplasm of peripheral nerves and autonomic nervous system of trunk, unspecified: Secondary | ICD-10-CM | POA: Diagnosis not present

## 2024-03-20 DIAGNOSIS — D2261 Melanocytic nevi of right upper limb, including shoulder: Secondary | ICD-10-CM | POA: Diagnosis not present

## 2024-03-20 DIAGNOSIS — D692 Other nonthrombocytopenic purpura: Secondary | ICD-10-CM | POA: Diagnosis not present

## 2024-03-20 DIAGNOSIS — L821 Other seborrheic keratosis: Secondary | ICD-10-CM | POA: Diagnosis not present

## 2024-03-20 DIAGNOSIS — L570 Actinic keratosis: Secondary | ICD-10-CM | POA: Diagnosis not present

## 2024-03-20 DIAGNOSIS — Z85828 Personal history of other malignant neoplasm of skin: Secondary | ICD-10-CM | POA: Diagnosis not present

## 2024-04-04 ENCOUNTER — Ambulatory Visit: Payer: Self-pay

## 2024-04-04 ENCOUNTER — Inpatient Hospital Stay

## 2024-04-04 DIAGNOSIS — I82402 Acute embolism and thrombosis of unspecified deep veins of left lower extremity: Secondary | ICD-10-CM

## 2024-04-04 DIAGNOSIS — I82502 Chronic embolism and thrombosis of unspecified deep veins of left lower extremity: Secondary | ICD-10-CM

## 2024-04-04 DIAGNOSIS — Z86718 Personal history of other venous thrombosis and embolism: Secondary | ICD-10-CM

## 2024-04-04 LAB — CBC WITH DIFFERENTIAL (CANCER CENTER ONLY)
Abs Immature Granulocytes: 0.02 10*3/uL (ref 0.00–0.07)
Basophils Absolute: 0 10*3/uL (ref 0.0–0.1)
Basophils Relative: 1 %
Eosinophils Absolute: 0.2 10*3/uL (ref 0.0–0.5)
Eosinophils Relative: 3 %
HCT: 49.5 % (ref 39.0–52.0)
Hemoglobin: 16.5 g/dL (ref 13.0–17.0)
Immature Granulocytes: 0 %
Lymphocytes Relative: 35 %
Lymphs Abs: 2.3 10*3/uL (ref 0.7–4.0)
MCH: 32.7 pg (ref 26.0–34.0)
MCHC: 33.3 g/dL (ref 30.0–36.0)
MCV: 98 fL (ref 80.0–100.0)
Monocytes Absolute: 0.6 10*3/uL (ref 0.1–1.0)
Monocytes Relative: 9 %
Neutro Abs: 3.5 10*3/uL (ref 1.7–7.7)
Neutrophils Relative %: 52 %
Platelet Count: 200 10*3/uL (ref 150–400)
RBC: 5.05 MIL/uL (ref 4.22–5.81)
RDW: 13.2 % (ref 11.5–15.5)
WBC Count: 6.6 10*3/uL (ref 4.0–10.5)
nRBC: 0 % (ref 0.0–0.2)

## 2024-04-04 LAB — PROTIME-INR
INR: 2.5 — ABNORMAL HIGH (ref 0.8–1.2)
Prothrombin Time: 27.1 s — ABNORMAL HIGH (ref 11.4–15.2)

## 2024-04-17 ENCOUNTER — Other Ambulatory Visit: Payer: Self-pay | Admitting: Family

## 2024-05-02 ENCOUNTER — Inpatient Hospital Stay: Attending: Family

## 2024-05-02 ENCOUNTER — Ambulatory Visit: Payer: Self-pay

## 2024-05-02 DIAGNOSIS — Z86718 Personal history of other venous thrombosis and embolism: Secondary | ICD-10-CM | POA: Insufficient documentation

## 2024-05-02 DIAGNOSIS — I82402 Acute embolism and thrombosis of unspecified deep veins of left lower extremity: Secondary | ICD-10-CM

## 2024-05-02 DIAGNOSIS — I82502 Chronic embolism and thrombosis of unspecified deep veins of left lower extremity: Secondary | ICD-10-CM

## 2024-05-02 LAB — PROTIME-INR
INR: 3.9 — ABNORMAL HIGH (ref 0.8–1.2)
Prothrombin Time: 38.7 s — ABNORMAL HIGH (ref 11.4–15.2)

## 2024-05-07 ENCOUNTER — Telehealth: Payer: Self-pay | Admitting: Family

## 2024-05-07 NOTE — Telephone Encounter (Signed)
 Lvm to return call for scheduling inr check 06/30-07/01.

## 2024-05-19 ENCOUNTER — Telehealth: Payer: Self-pay | Admitting: Family

## 2024-05-19 NOTE — Telephone Encounter (Signed)
 lvm to return call for scheduling lab appt for inr per inbasket.

## 2024-05-21 DIAGNOSIS — C44319 Basal cell carcinoma of skin of other parts of face: Secondary | ICD-10-CM | POA: Diagnosis not present

## 2024-05-21 DIAGNOSIS — Z85828 Personal history of other malignant neoplasm of skin: Secondary | ICD-10-CM | POA: Diagnosis not present

## 2024-05-23 ENCOUNTER — Inpatient Hospital Stay: Attending: Family

## 2024-05-23 ENCOUNTER — Ambulatory Visit: Payer: Self-pay | Admitting: Hematology & Oncology

## 2024-05-23 DIAGNOSIS — I82502 Chronic embolism and thrombosis of unspecified deep veins of left lower extremity: Secondary | ICD-10-CM

## 2024-05-23 DIAGNOSIS — Z86718 Personal history of other venous thrombosis and embolism: Secondary | ICD-10-CM | POA: Diagnosis not present

## 2024-05-23 DIAGNOSIS — I82402 Acute embolism and thrombosis of unspecified deep veins of left lower extremity: Secondary | ICD-10-CM

## 2024-05-23 LAB — CBC WITH DIFFERENTIAL (CANCER CENTER ONLY)
Abs Immature Granulocytes: 0.03 K/uL (ref 0.00–0.07)
Basophils Absolute: 0 K/uL (ref 0.0–0.1)
Basophils Relative: 0 %
Eosinophils Absolute: 0.2 K/uL (ref 0.0–0.5)
Eosinophils Relative: 2 %
HCT: 47.1 % (ref 39.0–52.0)
Hemoglobin: 15.9 g/dL (ref 13.0–17.0)
Immature Granulocytes: 0 %
Lymphocytes Relative: 24 %
Lymphs Abs: 2.3 K/uL (ref 0.7–4.0)
MCH: 32.4 pg (ref 26.0–34.0)
MCHC: 33.8 g/dL (ref 30.0–36.0)
MCV: 95.9 fL (ref 80.0–100.0)
Monocytes Absolute: 0.7 K/uL (ref 0.1–1.0)
Monocytes Relative: 8 %
Neutro Abs: 6.4 K/uL (ref 1.7–7.7)
Neutrophils Relative %: 66 %
Platelet Count: 207 K/uL (ref 150–400)
RBC: 4.91 MIL/uL (ref 4.22–5.81)
RDW: 13.2 % (ref 11.5–15.5)
WBC Count: 9.6 K/uL (ref 4.0–10.5)
nRBC: 0 % (ref 0.0–0.2)

## 2024-05-23 LAB — PROTIME-INR
INR: 2.6 — ABNORMAL HIGH (ref 0.8–1.2)
Prothrombin Time: 28.7 s — ABNORMAL HIGH (ref 11.4–15.2)

## 2024-06-06 ENCOUNTER — Inpatient Hospital Stay: Admitting: Family

## 2024-06-06 ENCOUNTER — Inpatient Hospital Stay

## 2024-06-06 VITALS — BP 134/83 | HR 61 | Temp 98.8°F | Resp 17 | Ht 71.0 in | Wt 192.0 lb

## 2024-06-06 DIAGNOSIS — Z7901 Long term (current) use of anticoagulants: Secondary | ICD-10-CM

## 2024-06-06 DIAGNOSIS — I82402 Acute embolism and thrombosis of unspecified deep veins of left lower extremity: Secondary | ICD-10-CM

## 2024-06-06 DIAGNOSIS — Z86718 Personal history of other venous thrombosis and embolism: Secondary | ICD-10-CM

## 2024-06-06 DIAGNOSIS — I82502 Chronic embolism and thrombosis of unspecified deep veins of left lower extremity: Secondary | ICD-10-CM | POA: Diagnosis not present

## 2024-06-06 LAB — CMP (CANCER CENTER ONLY)
ALT: 15 U/L (ref 0–44)
AST: 27 U/L (ref 15–41)
Albumin: 4 g/dL (ref 3.5–5.0)
Alkaline Phosphatase: 116 U/L (ref 38–126)
Anion gap: 9 (ref 5–15)
BUN: 14 mg/dL (ref 8–23)
CO2: 24 mmol/L (ref 22–32)
Calcium: 8.6 mg/dL — ABNORMAL LOW (ref 8.9–10.3)
Chloride: 104 mmol/L (ref 98–111)
Creatinine: 0.84 mg/dL (ref 0.61–1.24)
GFR, Estimated: >60 mL/min (ref >60)
Glucose, Bld: 94 mg/dL (ref 70–99)
Potassium: 4.6 mmol/L (ref 3.5–5.1)
Sodium: 137 mmol/L (ref 135–145)
Total Bilirubin: 1.1 mg/dL (ref 0.0–1.2)
Total Protein: 6.6 g/dL (ref 6.5–8.1)

## 2024-06-06 LAB — CBC WITH DIFFERENTIAL (CANCER CENTER ONLY)
Abs Immature Granulocytes: 0.02 K/uL (ref 0.00–0.07)
Basophils Absolute: 0.1 K/uL (ref 0.0–0.1)
Basophils Relative: 1 %
Eosinophils Absolute: 0.3 K/uL (ref 0.0–0.5)
Eosinophils Relative: 3 %
HCT: 43.2 % (ref 39.0–52.0)
Hemoglobin: 14.7 g/dL (ref 13.0–17.0)
Immature Granulocytes: 0 %
Lymphocytes Relative: 30 %
Lymphs Abs: 2.4 K/uL (ref 0.7–4.0)
MCH: 32.8 pg (ref 26.0–34.0)
MCHC: 34 g/dL (ref 30.0–36.0)
MCV: 96.4 fL (ref 80.0–100.0)
Monocytes Absolute: 0.7 K/uL (ref 0.1–1.0)
Monocytes Relative: 9 %
Neutro Abs: 4.7 K/uL (ref 1.7–7.7)
Neutrophils Relative %: 57 %
Platelet Count: 189 K/uL (ref 150–400)
RBC: 4.48 MIL/uL (ref 4.22–5.81)
RDW: 13 % (ref 11.5–15.5)
WBC Count: 8.2 K/uL (ref 4.0–10.5)
nRBC: 0 % (ref 0.0–0.2)

## 2024-06-06 LAB — LACTATE DEHYDROGENASE: LDH: 211 U/L — ABNORMAL HIGH (ref 98–192)

## 2024-06-06 LAB — PROTIME-INR
INR: 2.4 — ABNORMAL HIGH (ref 0.8–1.2)
Prothrombin Time: 27.7 s — ABNORMAL HIGH (ref 11.4–15.2)

## 2024-06-06 NOTE — Progress Notes (Signed)
 Hematology and Oncology Follow Up Visit  Raymond Goodner Sr. [id number] Apr 02, 1951 73 y.o. 06/06/2024   Principle Diagnosis:  Thrombus of left femoral vein (chronic) down to left popliteal vein -- idiopathic   Current Therapy:        Pradaxa  150 mg po BID -- started on 05/16/2022 d/c'd Coumadin  adjusted to maintain INR of 2.5-3.5              Interim History:  Raymond [name] is here today for follow-up. He is doing well. He has cut back on his tennis playing recently due to the heat outside. 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  This has kept him quite busy.  He has not noted any abnormal blood loss. His skin on the arms is thin and he will sometimes get scratches while doing yard work that bleed a small amount.  No abnormal bruising or petechiae.  No fever, chills, n/v, cough, rash, dizziness, SOB, chest pain, palpitations, abdominal pain or changes in bowel or bladder habits.  No swelling, numbness or tingling in his extremities at this time. Pedal pulses are 1+. No falls or syncope.  Appetite is good but he admits that he needs to better hydrate throughout the day. Weight is stable at 192 lbs.   ECOG Performance Status: 1 - Symptomatic but completely ambulatory  Medications:  Allergies as of 06/06/2024       Reactions   Penicillins Hives, Rash      Penicillin V Potassium Other (See Comments)        Medication List        Accurate as of June 06, 2024 10:08 AM. If you have any questions, ask your nurse or doctor.          acetaminophen  500 MG tablet Commonly known as: TYLENOL  Take 2 tablets (1,000 mg total) by mouth every 8 (eight) hours as needed.   atorvastatin  10 MG tablet Commonly known as: LIPITOR Take 1 tablet (10 mg total) by mouth daily.   cholecalciferol 25 MCG (1000 UNIT) tablet Commonly known as: VITAMIN D3 Take 1,000 Units by mouth daily.   doxycycline  100 MG tablet Commonly known as: VIBRA -TABS Take 1 tablet (100 mg total) by mouth 2 (two)  times daily.   gabapentin  100 MG capsule Commonly known as: NEURONTIN  Take 2 capsules (200 mg total) by mouth at bedtime.   hydrochlorothiazide  12.5 MG capsule Commonly known as: MICROZIDE  Take 1 capsule (12.5 mg total) by mouth daily.   losartan  50 MG tablet Commonly known as: COZAAR  TAKE ONE TABLET BY MOUTH ONE TIME DAILY   multivitamin with minerals Tabs tablet Take 1 tablet by mouth daily.   thiamine  100 MG tablet Commonly known as: Vitamin B-1 Take 1 tablet (100 mg total) by mouth daily.   warfarin 5 MG tablet Commonly known as: COUMADIN  Take 1-2 tablets (5-10 mg total) by mouth daily. Take 5mg  by mouth for two days. On 3rd day, take 10mg . Then repeat.        Allergies:  Allergies  Allergen Reactions   Penicillins Hives and Rash        Penicillin V Potassium Other (See Comments)    Past Medical History, Surgical history, Social history, and Family History were reviewed and updated.  Review of Systems: All other 10 point review of systems is negative.   Physical Exam:  vitals were not taken for this visit.   Wt Readings from Last 3 Encounters:  02/07/24 189 lb 12.8 oz (86.1 kg)  02/06/24  186 lb 12.8 oz (84.7 kg)  01/11/24 190 lb (86.2 kg)    Ocular: Sclerae unicteric, pupils equal, round and reactive to light Ear-nose-throat: Oropharynx clear, dentition fair Lymphatic: No cervical or supraclavicular adenopathy Lungs no rales or rhonchi, good excursion bilaterally Heart regular rate and rhythm, no murmur appreciated Abd soft, nontender, positive bowel sounds MSK no focal spinal tenderness, no joint edema Neuro: non-focal, well-oriented, appropriate affect Breasts: Deferred   Lab Results  Component Value Date   WBC 9.6 05/23/2024   HGB 15.9 05/23/2024   HCT 47.1 05/23/2024   MCV 95.9 05/23/2024   PLT 207 05/23/2024   No results found for: FERRITIN, IRON, TIBC, UIBC, IRONPCTSAT Lab Results  Component Value Date   RBC 4.91 05/23/2024    No results found for: KPAFRELGTCHN, LAMBDASER, KAPLAMBRATIO No results found for: IGGSERUM, IGA, IGMSERUM No results found for: STEPHANY CARLOTA BENSON MARKEL EARLA JOANNIE DOC VICK, SPEI   Chemistry      Component Value Date/Time   NA 138 02/06/2024 1036   K 4.0 02/06/2024 1036   CL 103 02/06/2024 1036   CO2 26 02/06/2024 1036   BUN 17 02/06/2024 1036   CREATININE 0.85 02/06/2024 1036      Component Value Date/Time   CALCIUM  9.0 02/06/2024 1036   ALKPHOS 116 02/06/2024 1036   AST 23 02/06/2024 1036   ALT 14 02/06/2024 1036   BILITOT 1.3 (H) 02/06/2024 1036       Impression and Plan:  Raymond [name] is a very pleasant 73 yo gentleman with left lower extremity DVT felt to be idiopathic.   INR 2.4. No changes to Coumadin  regimen at this time.  Monthly INR check and follow-up in 4 months.   Lauraine Pepper, NP 7/25/202510:08 AM

## 2024-06-09 ENCOUNTER — Ambulatory Visit: Payer: Self-pay

## 2024-06-09 NOTE — Telephone Encounter (Signed)
 Advised via MyChart.

## 2024-06-09 NOTE — Telephone Encounter (Signed)
-----   Message from Lauraine Pepper sent at 06/09/2024  3:17 PM EDT ----- INR remains therapeutic. No intervention needed.  ----- Message ----- From: Tonette Lauraine CHRISTELLA DEVONNA Sent: 06/09/2024  12:03 PM EDT To: Lauraine CHRISTELLA Pepper, NP   ----- Message ----- From: Rebecka, Lab In [place] Sent: 06/06/2024  10:28 AM EDT To: Lauraine CHRISTELLA Tonette, PA-C

## 2024-06-10 ENCOUNTER — Ambulatory Visit

## 2024-07-07 ENCOUNTER — Inpatient Hospital Stay: Attending: Hematology & Oncology

## 2024-07-07 ENCOUNTER — Ambulatory Visit: Payer: Self-pay | Admitting: *Deleted

## 2024-07-07 DIAGNOSIS — I82402 Acute embolism and thrombosis of unspecified deep veins of left lower extremity: Secondary | ICD-10-CM

## 2024-07-07 DIAGNOSIS — Z86718 Personal history of other venous thrombosis and embolism: Secondary | ICD-10-CM | POA: Insufficient documentation

## 2024-07-07 DIAGNOSIS — I82502 Chronic embolism and thrombosis of unspecified deep veins of left lower extremity: Secondary | ICD-10-CM

## 2024-07-07 DIAGNOSIS — Z7901 Long term (current) use of anticoagulants: Secondary | ICD-10-CM | POA: Insufficient documentation

## 2024-07-07 LAB — CMP (CANCER CENTER ONLY)
ALT: 19 U/L (ref 0–44)
AST: 28 U/L (ref 15–41)
Albumin: 4.4 g/dL (ref 3.5–5.0)
Alkaline Phosphatase: 126 U/L (ref 38–126)
Anion gap: 12 (ref 5–15)
BUN: 14 mg/dL (ref 8–23)
CO2: 26 mmol/L (ref 22–32)
Calcium: 9.3 mg/dL (ref 8.9–10.3)
Chloride: 104 mmol/L (ref 98–111)
Creatinine: 0.95 mg/dL (ref 0.61–1.24)
GFR, Estimated: >60 mL/min (ref >60)
Glucose, Bld: 106 mg/dL — ABNORMAL HIGH (ref 70–99)
Potassium: 4.4 mmol/L (ref 3.5–5.1)
Sodium: 141 mmol/L (ref 135–145)
Total Bilirubin: 0.9 mg/dL (ref 0.0–1.2)
Total Protein: 7.5 g/dL (ref 6.5–8.1)

## 2024-07-07 LAB — CBC WITH DIFFERENTIAL (CANCER CENTER ONLY)
Abs Immature Granulocytes: 0.01 K/uL (ref 0.00–0.07)
Basophils Absolute: 0 K/uL (ref 0.0–0.1)
Basophils Relative: 1 %
Eosinophils Absolute: 0.4 K/uL (ref 0.0–0.5)
Eosinophils Relative: 6 %
HCT: 46.6 % (ref 39.0–52.0)
Hemoglobin: 15.6 g/dL (ref 13.0–17.0)
Immature Granulocytes: 0 %
Lymphocytes Relative: 34 %
Lymphs Abs: 2.3 K/uL (ref 0.7–4.0)
MCH: 32.8 pg (ref 26.0–34.0)
MCHC: 33.5 g/dL (ref 30.0–36.0)
MCV: 97.9 fL (ref 80.0–100.0)
Monocytes Absolute: 0.5 K/uL (ref 0.1–1.0)
Monocytes Relative: 8 %
Neutro Abs: 3.4 K/uL (ref 1.7–7.7)
Neutrophils Relative %: 51 %
Platelet Count: 226 K/uL (ref 150–400)
RBC: 4.76 MIL/uL (ref 4.22–5.81)
RDW: 13 % (ref 11.5–15.5)
WBC Count: 6.7 K/uL (ref 4.0–10.5)
nRBC: 0 % (ref 0.0–0.2)

## 2024-07-07 LAB — PROTIME-INR
INR: 2.4 — ABNORMAL HIGH (ref 0.8–1.2)
Prothrombin Time: 27.6 s — ABNORMAL HIGH (ref 11.4–15.2)

## 2024-07-07 NOTE — Progress Notes (Unsigned)
 [facility] [address] [place] [address] Phone: [phone] Subjective:   Raymond [name], am serving as a scribe for Dr. Arthea Claudene.  I'm seeing this patient by the request  of:  Jason Leita Repine, FNP  CC: Left knee pain, right shoulder pain  YEP:Dlagzrupcz  01/11/2024 Right shoulder pain.  Discussed with patient about the impingement noted.  Given exercises.  Discussed which activities to do and which ones to avoid.  Increase activity slowly.  Will follow-up again in 8 to 12 weeks     Stable, is able to increase activity, continues to do relatively well.  Discussed icing regimen and home exercises.     Discussed with patient at great length.  Hold on any injections.  Continue to stay active.  Follow-up again in 6 months     Updated 07/10/2024 Raymond Mathew Drones Sr. is a 73 y.o. male coming in with complaint of R shoulder and L knee pain, patient was seen previously and did have a DVT in the left popliteal vein. Back pain the last few days. Related to doing more activity in garden. R shoulder pain has gotten better. No new medical issues since last visit.       Past Medical History:  Diagnosis Date   Allergy penicillin   DDD (degenerative disc disease), lumbar    DVT (deep venous thrombosis) (HCC)    GERD (gastroesophageal reflux disease)    Hyperlipidemia    Hypertension    Leg DVT (deep venous thromboembolism), chronic, left (HCC) 12/12/2021   Raynaud disease    Past Surgical History:  Procedure Laterality Date   EYE SURGERY     Social History   Socioeconomic History   Marital status: Widowed    Spouse name: Not on file   Number of children: Not on file   Years of education: Not on file   Highest education level: Master's degree (e.g., MA, MS, MEng, MEd, MSW, MBA)  Occupational History   Not on file  Tobacco Use   Smoking status: Never   Smokeless tobacco: Not on file  Vaping Use   Vaping status: Never Used   Substance and Sexual Activity   Alcohol use: Yes    Alcohol/week: 10.0 standard drinks of alcohol    Types: 10 Glasses of wine per week   Drug use: Not Currently   Sexual activity: Not Currently  Other Topics Concern   Not on file  Social History Narrative   Not on file   Social Drivers of Health   Financial Resource Strain: Low Risk  (02/06/2024)   Overall Financial Resource Strain (CARDIA)    Difficulty of Paying Living Expenses: Not very hard  Food Insecurity: No Food Insecurity (02/06/2024)   Hunger Vital Sign    Worried About Running Out of Food in the Last Year: Never true    Ran Out of Food in the Last Year: Never true  Transportation Needs: No Transportation Needs (02/06/2024)   PRAPARE - Administrator, Civil Service (Medical): No    Lack of Transportation (Non-Medical): No  Physical Activity: Sufficiently Active (02/06/2024)   Exercise Vital Sign    Days of Exercise per Week: 5 days    Minutes of Exercise per Session: 50 min  Stress: Stress Concern Present (02/06/2024)   [employer] of Occupational Health - Occupational Stress Questionnaire    Feeling of Stress : To some extent  Social Connections: Moderately Isolated (02/06/2024)   Social Connection and  Isolation Panel    Frequency of Communication with Friends and Family: More than three times a week    Frequency of Social Gatherings with Friends and Family: Three times a week    Attends Religious Services: Never    Active Member of Clubs or Organizations: Yes    Attends Banker Meetings: More than 4 times per year    Marital Status: Widowed   Allergies  Allergen Reactions   Penicillins Hives and Rash        Penicillin V Potassium Other (See Comments)   Family History  Problem Relation Age of Onset   Alcohol abuse Mother    Arthritis Mother    Diabetes Mother    Mental retardation Mother    Alcohol abuse Father    Hypertension Father    Cancer Sister    Depression Sister     Cancer Sister    Arthritis Maternal Grandmother    Parkinson's disease Maternal Grandfather      Current Outpatient Medications (Cardiovascular):    atorvastatin  (LIPITOR) 10 MG tablet, Take 1 tablet (10 mg total) by mouth daily.   hydrochlorothiazide  (MICROZIDE ) 12.5 MG capsule, Take 1 capsule (12.5 mg total) by mouth daily.   losartan  (COZAAR ) 50 MG tablet, TAKE ONE TABLET BY MOUTH ONE TIME DAILY   Current Outpatient Medications (Analgesics):    acetaminophen  (TYLENOL ) 500 MG tablet, Take 2 tablets (1,000 mg total) by mouth every 8 (eight) hours as needed.  Current Outpatient Medications (Hematological):    warfarin (COUMADIN ) 5 MG tablet, Take 1-2 tablets (5-10 mg total) by mouth daily. Take 5mg  by mouth for two days. On 3rd day, take 10mg . Then repeat.  Current Outpatient Medications (Other):    cholecalciferol (VITAMIN D3) 25 MCG (1000 UNIT) tablet, Take 1,000 Units by mouth daily.   doxycycline  (VIBRA -TABS) 100 MG tablet, Take 1 tablet (100 mg total) by mouth 2 (two) times daily.   gabapentin  (NEURONTIN ) 100 MG capsule, Take 2 capsules (200 mg total) by mouth at bedtime.   Multiple Vitamin (MULTIVITAMIN WITH MINERALS) TABS tablet, Take 1 tablet by mouth daily.   thiamine  (VITAMIN B-1) 100 MG tablet, Take 1 tablet (100 mg total) by mouth daily.   Reviewed prior external information including notes and imaging from  primary care provider As well as notes that were available from care everywhere and other healthcare systems.  Past medical history, social, surgical and family history all reviewed in electronic medical record.  No pertanent information unless stated regarding to the chief complaint.   Review of Systems:  No headache, visual changes, nausea, vomiting, diarrhea, constipation, dizziness, abdominal pain, skin rash, fevers, chills, night sweats, weight loss, swollen lymph nodes, body aches, joint swelling, chest pain, shortness of breath, mood changes. POSITIVE muscle  aches  Objective  Blood pressure 128/80, height 5' 11 (1.803 m), weight 189 lb (85.7 kg).   General: No apparent distress alert and oriented x3 mood and affect normal, dressed appropriately.  HEENT: Pupils equal, extraocular movements intact  Respiratory: Patient's speak in full sentences and does not appear short of breath  Cardiovascular: No lower extremity edema, non tender, no erythema  Shoulder exam shows patient does have some arthritic changes noted.  Still has some tenderness to palpation a little bit.  Neck exam does have some loss lordosis but able to move relatively well.  Low back patient is sitting relatively comfortably at this moment.  He does have just some mild discomfort when going from seated to standing position.  Knee exam shows    Impression and Recommendations:    The above documentation has been reviewed and is accurate and complete Leeta Grimme M Olyvia Gopal, DO

## 2024-07-10 ENCOUNTER — Ambulatory Visit (INDEPENDENT_AMBULATORY_CARE_PROVIDER_SITE_OTHER): Payer: Medicare Other | Admitting: Family Medicine

## 2024-07-10 VITALS — BP 128/80 | Ht 71.0 in | Wt 189.0 lb

## 2024-07-10 DIAGNOSIS — M5441 Lumbago with sciatica, right side: Secondary | ICD-10-CM | POA: Diagnosis not present

## 2024-07-10 DIAGNOSIS — I82502 Chronic embolism and thrombosis of unspecified deep veins of left lower extremity: Secondary | ICD-10-CM | POA: Diagnosis not present

## 2024-07-10 DIAGNOSIS — G8929 Other chronic pain: Secondary | ICD-10-CM | POA: Diagnosis not present

## 2024-07-10 NOTE — Assessment & Plan Note (Signed)
 History of DVT and is on Coumadin  which makes anti-inflammatories impossible but he is taking Tylenol  for breakthrough pain

## 2024-07-10 NOTE — Assessment & Plan Note (Signed)
 Low back seems to be stable at this moment.  Still has worsening pain when doing a lot of repetitive activity especially with playing tennis.  States that in the third manage has some difficulty.  We discussed diet, we discussed hydration, discussed other medications which patient still wants to avoid.  Will consider the possibility of epidurals but would like to wait until the beginning of next season.  Increase activity slowly otherwise.  Follow-up again 6 to 8 weeks.

## 2024-07-10 NOTE — Patient Instructions (Signed)
 Glad you're doing so well You have great insight Chyrl Pines for PCP Drink gatorade or something with a little sodium before playing See you again in 5 months

## 2024-07-15 ENCOUNTER — Other Ambulatory Visit: Payer: Self-pay | Admitting: Family

## 2024-07-15 ENCOUNTER — Telehealth: Payer: Self-pay | Admitting: Family Medicine

## 2024-07-15 NOTE — Telephone Encounter (Signed)
 Pt states that Dr. Claudene recommend Dr. Chyrl Pines at [facility]. Pt called their office and was told that Dr. Pines is NOT taking New Patients.  Pt asking if Dr. Claudene would reach out to Dr. Pines to assist with this request.

## 2024-07-17 ENCOUNTER — Telehealth: Payer: Self-pay | Admitting: Family

## 2024-07-17 NOTE — Telephone Encounter (Signed)
 Appreciate Dr. Claudene thinking of me.  Although I am closed to new patients I will agree to accept this patient, please schedule new patient appointment so we can review his medical history, meds, etc.  First available.  Thanks

## 2024-07-17 NOTE — Telephone Encounter (Signed)
 Copied from CRM [id number]. Topic: Medicare AWV >> Jul 17, 2024 10:27 AM Nathanel DEL wrote: Reason for CRM: Called LVM 07/17/2024 to schedule AWV. Please schedule office or virtual visits.  Nathanel Paschal; Care Guide Ambulatory Clinical Support [place] [facility] Direct Dial: [phone]

## 2024-07-18 NOTE — Telephone Encounter (Addendum)
Pt has been scheduled on 10/3

## 2024-07-18 NOTE — Telephone Encounter (Signed)
 Left VM for pt, advised [facility]BS[facility] will reach out for scheduling with Dr. Levora.

## 2024-07-20 ENCOUNTER — Encounter: Payer: Self-pay | Admitting: Family Medicine

## 2024-08-07 ENCOUNTER — Inpatient Hospital Stay: Attending: Hematology & Oncology

## 2024-08-07 ENCOUNTER — Ambulatory Visit (INDEPENDENT_AMBULATORY_CARE_PROVIDER_SITE_OTHER): Admitting: *Deleted

## 2024-08-07 VITALS — Ht 71.0 in | Wt 185.0 lb

## 2024-08-07 DIAGNOSIS — Z0000 Encounter for general adult medical examination without abnormal findings: Secondary | ICD-10-CM | POA: Diagnosis not present

## 2024-08-07 DIAGNOSIS — I82502 Chronic embolism and thrombosis of unspecified deep veins of left lower extremity: Secondary | ICD-10-CM

## 2024-08-07 DIAGNOSIS — Z7901 Long term (current) use of anticoagulants: Secondary | ICD-10-CM | POA: Diagnosis not present

## 2024-08-07 DIAGNOSIS — Z86718 Personal history of other venous thrombosis and embolism: Secondary | ICD-10-CM | POA: Diagnosis present

## 2024-08-07 DIAGNOSIS — I82402 Acute embolism and thrombosis of unspecified deep veins of left lower extremity: Secondary | ICD-10-CM

## 2024-08-07 LAB — CBC WITH DIFFERENTIAL (CANCER CENTER ONLY)
Abs Immature Granulocytes: 0.03 K/uL (ref 0.00–0.07)
Basophils Absolute: 0.1 K/uL (ref 0.0–0.1)
Basophils Relative: 1 %
Eosinophils Absolute: 0.6 K/uL — ABNORMAL HIGH (ref 0.0–0.5)
Eosinophils Relative: 7 %
HCT: 46.9 % (ref 39.0–52.0)
Hemoglobin: 15.4 g/dL (ref 13.0–17.0)
Immature Granulocytes: 0 %
Lymphocytes Relative: 28 %
Lymphs Abs: 2.4 K/uL (ref 0.7–4.0)
MCH: 32 pg (ref 26.0–34.0)
MCHC: 32.8 g/dL (ref 30.0–36.0)
MCV: 97.5 fL (ref 80.0–100.0)
Monocytes Absolute: 0.7 K/uL (ref 0.1–1.0)
Monocytes Relative: 8 %
Neutro Abs: 4.8 K/uL (ref 1.7–7.7)
Neutrophils Relative %: 56 %
Platelet Count: 211 K/uL (ref 150–400)
RBC: 4.81 MIL/uL (ref 4.22–5.81)
RDW: 13 % (ref 11.5–15.5)
WBC Count: 8.6 K/uL (ref 4.0–10.5)
nRBC: 0 % (ref 0.0–0.2)

## 2024-08-07 LAB — PROTIME-INR
INR: 3 — ABNORMAL HIGH (ref 0.8–1.2)
Prothrombin Time: 32.5 s — ABNORMAL HIGH (ref 11.4–15.2)

## 2024-08-07 NOTE — Progress Notes (Signed)
 Please attest this visit in the absence of patient primary care provider.    Subjective:   Raymond Barro Sr. is a 73 y.o. who presents for a Medicare Wellness preventive visit.  As a reminder, Annual Wellness Visits don't include a physical exam, and some assessments may be limited, especially if this visit is performed virtually. We may recommend an in-person follow-up visit with your provider if needed.  Visit Complete: Virtual I connected with  Raymond Mathew Drones Sr. on 08/07/24 by a audio enabled telemedicine application and verified that I am speaking with the correct person using two identifiers.  Patient Location: Home  Provider Location: Office/Clinic  I discussed the limitations of evaluation and management by telemedicine. The patient expressed understanding and agreed to proceed.  Vital Signs: Because this visit was a virtual/telehealth visit, some criteria may be missing or patient reported. Any vitals not documented were not able to be obtained and vitals that have been documented are patient reported.  VideoDeclined- This patient declined Librarian, academic. Therefore the visit was completed with audio only.  Persons Participating in Visit: Patient.  AWV Questionnaire: Yes: Patient Medicare AWV questionnaire was completed by the patient on 08/03/24; I have confirmed that all information answered by patient is correct and no changes since this date.  Cardiac Risk Factors include: advanced age (>37men, >59 women);male gender;dyslipidemia;hypertension     Objective:    Today's Vitals   08/07/24 1258  Weight: 185 lb (83.9 kg)  Height: 5' 11 (1.803 m)   Body mass index is 25.8 kg/m.     08/07/2024    1:10 PM 06/06/2024   10:09 AM 02/06/2024   10:44 AM 09/11/2023    2:46 PM 06/05/2023    3:12 PM 02/09/2023    2:40 PM 01/30/2023    7:58 AM  Advanced Directives  Does Patient Have a Medical Advance Directive? Yes No No Yes No No    Type of Estate agent of [place];Living will   Living will;Healthcare Power of Attorney  Living will;Healthcare Power of Attorney   Does patient want to make changes to medical advance directive? No - Patient declined   No - Patient declined  No - Patient declined   Copy of Healthcare Power of Attorney in Chart? No - copy requested   No - copy requested     Would patient like information on creating a medical advance directive?   No - Patient declined No - Patient declined No - Patient declined  No - Patient declined    Current Medications (verified) Outpatient Encounter Medications as of 08/07/2024  Medication Sig   acetaminophen  (TYLENOL ) 500 MG tablet Take 2 tablets (1,000 mg total) by mouth every 8 (eight) hours as needed.   atorvastatin  (LIPITOR) 10 MG tablet TAKE ONE TABLET BY MOUTH ONE TIME DAILY   gabapentin  (NEURONTIN ) 100 MG capsule Take 2 capsules (200 mg total) by mouth at bedtime.   hydrochlorothiazide  (MICROZIDE ) 12.5 MG capsule TAKE ONE CAPSULE BY MOUTH ONE TIME DAILY   losartan  (COZAAR ) 50 MG tablet TAKE ONE TABLET BY MOUTH ONE TIME DAILY   Multiple Vitamin (MULTIVITAMIN WITH MINERALS) TABS tablet Take 1 tablet by mouth daily.   thiamine  (VITAMIN B-1) 100 MG tablet Take 1 tablet (100 mg total) by mouth daily.   warfarin (COUMADIN ) 5 MG tablet Take 1-2 tablets (5-10 mg total) by mouth daily. Take 5mg  by mouth for two days. On 3rd day, take 10mg . Then repeat.   [DISCONTINUED] cholecalciferol (  VITAMIN D3) 25 MCG (1000 UNIT) tablet Take 1,000 Units by mouth daily.   [DISCONTINUED] doxycycline  (VIBRA -TABS) 100 MG tablet Take 1 tablet (100 mg total) by mouth 2 (two) times daily.   No facility-administered encounter medications on file as of 08/07/2024.    Allergies (verified) Penicillins and Penicillin v potassium   History: Past Medical History:  Diagnosis Date   Allergy penicillin   DDD (degenerative disc disease), lumbar    DVT (deep venous  thrombosis) (HCC)    GERD (gastroesophageal reflux disease)    Hyperlipidemia    Hypertension    Leg DVT (deep venous thromboembolism), chronic, left (HCC) 12/12/2021   Raynaud disease    Past Surgical History:  Procedure Laterality Date   EYE SURGERY     Family History  Problem Relation Age of Onset   Alcohol abuse Mother    Arthritis Mother    Diabetes Mother    Mental retardation Mother    Alcohol abuse Father    Hypertension Father    Cancer Sister    Depression Sister    Cancer Sister    Arthritis Maternal Grandmother    Parkinson's disease Maternal Grandfather    Social History   Socioeconomic History   Marital status: Widowed    Spouse name: Not on file   Number of children: Not on file   Years of education: Not on file   Highest education level: Master's degree (e.g., MA, MS, MEng, MEd, MSW, MBA)  Occupational History   Not on file  Tobacco Use   Smoking status: Never   Smokeless tobacco: Never  Vaping Use   Vaping status: Never Used  Substance and Sexual Activity   Alcohol use: Yes    Alcohol/week: 10.0 standard drinks of alcohol    Types: 10 Glasses of wine per week   Drug use: Not Currently   Sexual activity: Not Currently  Other Topics Concern   Not on file  Social History Narrative   Not on file   Social Drivers of Health   Financial Resource Strain: Low Risk  (08/03/2024)   Overall Financial Resource Strain (CARDIA)    Difficulty of Paying Living Expenses: Not hard at all  Food Insecurity: No Food Insecurity (08/03/2024)   Hunger Vital Sign    Worried About Running Out of Food in the Last Year: Never true    Ran Out of Food in the Last Year: Never true  Transportation Needs: No Transportation Needs (08/03/2024)   PRAPARE - Administrator, Civil Service (Medical): No    Lack of Transportation (Non-Medical): No  Physical Activity: Sufficiently Active (08/03/2024)   Exercise Vital Sign    Days of Exercise per Week: 3 days    Minutes  of Exercise per Session: 90 min  Stress: Stress Concern Present (08/03/2024)   [employer] of Occupational Health - Occupational Stress Questionnaire    Feeling of Stress: To some extent  Social Connections: Moderately Isolated (08/03/2024)   Social Connection and Isolation Panel    Frequency of Communication with Friends and Family: More than three times a week    Frequency of Social Gatherings with Friends and Family: Three times a week    Attends Religious Services: Never    Active Member of Clubs or Organizations: Yes    Attends Banker Meetings: More than 4 times per year    Marital Status: Widowed    Tobacco Counseling Counseling given: Not Answered    Clinical Intake:  Pre-visit preparation completed:  Yes  Pain : No/denies pain     BMI - recorded: 25.8 Nutritional Status: BMI 25 -29 Overweight Nutritional Risks: None Diabetes: No  No results found for: HGBA1C   How often do you need to have someone help you when you read instructions, pamphlets, or other written materials from your doctor or pharmacy?: 1 - Never  Interpreter Needed?: No  Information entered by :: Zanayah Shadowens, CMA(AAMA)   Activities of Daily Living     08/03/2024    8:54 AM  In your present state of health, do you have any difficulty performing the following activities:  Hearing? 0  Vision? 0  Difficulty concentrating or making decisions? 0  Walking or climbing stairs? 0  Dressing or bathing? 0  Doing errands, shopping? 0  Preparing Food and eating ? N  Using the Toilet? N  In the past six months, have you accidently leaked urine? N  Do you have problems with loss of bowel control? N  Managing your Medications? N  Managing your Finances? N  Housekeeping or managing your Housekeeping? N    Patient Care Team: Jason Leita Repine, FNP as PCP - General (Internal Medicine) [facility], [facility]  I have updated your Care Teams any recent Medical Services  you may have received from other providers in the past year.     Assessment:   This is a routine wellness examination for Deontray.  Hearing/Vision screen Hearing Screening - Comments:: Notes slight decrease in hearing; may get hearing test in the future.  Vision Screening - Comments:: Up to date with routine eye exams with    Goals Addressed   None    Depression Screen     08/07/2024    1:06 PM 06/06/2024   10:09 AM 02/07/2024    8:52 AM 06/05/2023    3:13 PM 06/29/2022    1:47 PM 06/01/2022    2:28 PM 01/06/2022   11:39 AM  PHQ 2/9 Scores  PHQ - 2 Score 1 0 0 2 1 2 4   PHQ- 9 Score 1     9 11     Fall Risk     08/03/2024    8:54 AM 02/07/2024    8:51 AM 06/04/2023    8:44 AM 02/23/2023   11:23 AM 06/29/2022    1:47 PM  Fall Risk   Falls in the past year? 0 0 1 1 0  Comment   fell over some uneven ground in back yard Patient entered data at this time. Check the audit trail.    Number falls in past yr: 0 0 1 0 0  Injury with Fall? 0 0 1 1 0  Risk for fall due to : History of fall(s) No Fall Risks History of fall(s) History of fall(s) No Fall Risks  Follow up Education provided Falls evaluation completed Falls evaluation completed Falls evaluation completed Falls evaluation completed      Data saved with a previous flowsheet row definition    MEDICARE RISK AT HOME:  Medicare Risk at Home Any stairs in or around the home?: (Patient-Rptd) Yes If so, are there any without handrails?: (Patient-Rptd) No Home free of loose throw rugs in walkways, pet beds, electrical cords, etc?: (Patient-Rptd) Yes Adequate lighting in your home to reduce risk of falls?: (Patient-Rptd) Yes Life alert?: (Patient-Rptd) No Use of a cane, walker or w/c?: (Patient-Rptd) No Grab bars in the bathroom?: (Patient-Rptd) No Shower chair or bench in shower?: (Patient-Rptd) Yes Elevated toilet seat or a handicapped toilet?: (Patient-Rptd)  No  TIMED UP AND GO:  Was the test performed?  No,audio  Cognitive  Function: 6CIT completed        08/07/2024    1:12 PM 06/05/2023    3:14 PM 06/01/2022    2:39 PM  6CIT Screen  What Year? 0 points 0 points 0 points  What month? 0 points 0 points 0 points  What time? 0 points 0 points 0 points  Count back from 20 0 points 0 points 0 points  Months in reverse 0 points 0 points 0 points  Repeat phrase 0 points 0 points 0 points  Total Score 0 points 0 points 0 points    Immunizations Immunization History  Administered Date(s) Administered   Fluad Quad(high Dose 65+) 08/30/2021   INFLUENZA, HIGH DOSE SEASONAL PF 08/18/2019   PFIZER(Purple Top)SARS-COV-2 Vaccination 12/19/2019, 01/21/2020, 10/24/2020, 02/28/2021   Pneumococcal Conjugate-13 01/23/2017   Pneumococcal Polysaccharide-23 05/22/2018   Tdap 02/17/2021, 01/29/2023    Screening Tests Health Maintenance  Topic Date Due   Hepatitis C Screening  Never done   Medicare Annual Wellness (AWV)  06/04/2024   Influenza Vaccine  06/13/2024   Zoster Vaccines- Shingrix (1 of 2) 11/06/2024 (Originally 03/18/1970)   COVID-19 Vaccine (5 - 2025-26 season) 08/07/2025 (Originally 07/14/2024)   Fecal DNA (Cologuard)  08/07/2025 (Originally [birth year]/18/1994)   DTaP/Tdap/Td (3 - Td or Tdap) 01/28/2033   Pneumococcal Vaccine: 50+ Years  Completed   HPV VACCINES  Aged Out   Meningococcal B Vaccine  Aged Out    Health Maintenance Items Addressed: Will discuss colonoscopy, hep C screen and Covid vaccine with Dr Levora. Will get flu vaccine at upcoming OV.  Additional Screening:  Vision Screening: Recommended annual ophthalmology exams for early detection of glaucoma and other disorders of the eye. Is the patient up to date with their annual eye exam?  Yes  Who is the provider or what is the name of the office in which the patient attends annual eye exams? [facility]  Dental Screening: Recommended annual dental exams for proper oral hygiene  Community Resource Referral / Chronic Care Management: CRR required  this visit?  No   CCM required this visit?  No   Plan:    I have personally reviewed and noted the following in the patient's chart:   Medical and social history Use of alcohol, tobacco or illicit drugs  Current medications and supplements including opioid prescriptions. Patient is not currently taking opioid prescriptions. Functional ability and status Nutritional status Physical activity Advanced directives List of other physicians Hospitalizations, surgeries, and ER visits in previous 12 months Vitals Screenings to include cognitive, depression, and falls Referrals and appointments  In addition, I have reviewed and discussed with patient certain preventive protocols, quality metrics, and best practice recommendations. A written personalized care plan for preventive services as well as general preventive health recommendations were provided to patient.   Lolita Libra, CMA   08/07/2024   After Visit Summary: (MyChart) Due to this being a telephonic visit, the after visit summary with patients personalized plan was offered to patient via MyChart   Notes: Nothing significant to report at this time.

## 2024-08-07 NOTE — Patient Instructions (Addendum)
 Mr. Leggette , Thank you for taking time out of your busy schedule to complete your Annual Wellness Visit with me. I enjoyed our conversation and look forward to speaking with you again next year. I, as well as your care team,  appreciate your ongoing commitment to your health goals. Please review the following plan we discussed and let me know if I can assist you in the future. Your Game plan/ To Do List    Follow up Visits: Next Medicare AWV with our clinical staff: 08/25/25 11:30am, telephone.    Next Office Visit with your provider: 08/15/24 8am, Dr Levora in [place]. (Due for flu vaccine, Hep C testing). Discuss colonoscopy and COVID vaccine  Clinician Recommendations:  Aim for 30 minutes of exercise or brisk walking, 6-8 glasses of water, and 5 servings of fruits and vegetables each day.    You will need to get the following vaccines at your local pharmacy:  Shingles (reminder to check insurance coverage)     This is a list of the screening recommended for you and due dates:  Health Maintenance  Topic Date Due   Hepatitis C Screening  Never done   Medicare Annual Wellness Visit  06/04/2024   Flu Shot  06/13/2024   Zoster (Shingles) Vaccine (1 of 2) 11/06/2024*   COVID-19 Vaccine (5 - 2025-26 season) 08/07/2025*   Cologuard (Stool DNA test)  08/07/2025*   DTaP/Tdap/Td vaccine (3 - Td or Tdap) 01/28/2033   Pneumococcal Vaccine for age over 28  Completed   HPV Vaccine  Aged Out   Meningitis B Vaccine  Aged Out  *Topic was postponed. The date shown is not the original due date.    Advanced directives: (Copy Requested) Please bring a copy of your health care power of attorney and living will to the office to be added to your chart at your convenience. You can mail to [facility] [address] [place], [place] [address] or email to [email or web address] Advance Care Planning is important because it:  [x]  Makes sure you receive the medical care that is  consistent with your values, goals, and preferences  [x]  It provides guidance to your family and loved ones and reduces their decisional burden about whether or not they are making the right decisions based on your wishes.  Follow the link provided in your after visit summary or read over the paperwork we have mailed to you to help you started getting your Advance Directives in place. If you need assistance in completing these, please reach out to us  so that we can help you!  See attachments for Preventive Care and Fall Prevention Tips.

## 2024-08-08 ENCOUNTER — Ambulatory Visit: Payer: Self-pay

## 2024-08-15 ENCOUNTER — Encounter: Payer: Self-pay | Admitting: Family Medicine

## 2024-08-15 ENCOUNTER — Ambulatory Visit: Admitting: Family Medicine

## 2024-08-15 VITALS — BP 124/78 | HR 67 | Temp 97.8°F | Ht 71.0 in | Wt 188.0 lb

## 2024-08-15 DIAGNOSIS — E785 Hyperlipidemia, unspecified: Secondary | ICD-10-CM | POA: Diagnosis not present

## 2024-08-15 DIAGNOSIS — I10 Essential (primary) hypertension: Secondary | ICD-10-CM | POA: Diagnosis not present

## 2024-08-15 DIAGNOSIS — C44619 Basal cell carcinoma of skin of left upper limb, including shoulder: Secondary | ICD-10-CM | POA: Insufficient documentation

## 2024-08-15 DIAGNOSIS — Z1159 Encounter for screening for other viral diseases: Secondary | ICD-10-CM

## 2024-08-15 DIAGNOSIS — I82502 Chronic embolism and thrombosis of unspecified deep veins of left lower extremity: Secondary | ICD-10-CM | POA: Diagnosis not present

## 2024-08-15 DIAGNOSIS — Z23 Encounter for immunization: Secondary | ICD-10-CM | POA: Diagnosis not present

## 2024-08-15 LAB — LIPID PANEL
Cholesterol: 169 mg/dL (ref 0–200)
HDL: 53.5 mg/dL (ref >39.00)
LDL Cholesterol: 93 mg/dL (ref 0–99)
NonHDL: 115.85
Total CHOL/HDL Ratio: 3
Triglycerides: 113 mg/dL (ref 0.0–149.0)
VLDL: 22.6 mg/dL (ref 0.0–40.0)

## 2024-08-15 MED ORDER — LOSARTAN POTASSIUM 50 MG PO TABS: 50.0000 mg | ORAL_TABLET | Freq: Every day | ORAL | 1 refills | Status: AC

## 2024-08-15 MED ORDER — HYDROCHLOROTHIAZIDE 12.5 MG PO CAPS: 12.5000 mg | ORAL_CAPSULE | Freq: Every day | ORAL | 1 refills | Status: AC

## 2024-08-15 MED ORDER — ATORVASTATIN CALCIUM 10 MG PO TABS: 10.0000 mg | ORAL_TABLET | Freq: Every day | ORAL | 1 refills | Status: AC

## 2024-08-15 NOTE — Patient Instructions (Signed)
 Thank you for coming in today. No change in medications at this time. If there are any concerns on your bloodwork, I will let you know. Take care!

## 2024-08-15 NOTE — Progress Notes (Signed)
 Subjective:  Patient ID: Raymond Mathew Drones Sr., male    DOB: [birth year]  Age: 73 y.o. MRN: [id number]  CC:  Chief Complaint  Patient presents with   Establish Care    Would like flu vaccine and hep c testing.    Abdominal Pain    Would like to self monitor and come back if still having issues. Does not want to address today    HPI [name] presents for   Transfer of care, as above.   Previous PCP Leita Elbe, FNP. Location reason - considering move to [place], downsizing.  Wife passed in September 2022. Retired Airline pilot.   Son in [employer]	[place], daughter locally - owner of health spa.  Doing well recently.   He has medical history of chronic left DVT  - from 10/2021, for which he takes Coumadin , monitored at hematology, Dr. Timmy, Lauraine Pepper.  Last INR therapeutic at 3.0, previously 2.4, 2.4.  monthly monitoring. Coumadin  dose - 5mg  daily.  Last few weeks - some bloating or cramps with certain foods - has taken acid refulx meds in past. No symptoms today.plans on trying acid blocker and then follow up if not improving.  No n/v/fever/wt loss.  Considering colonoscopy - will d/w hematology re: coumadin  and let me know.   Hypertension treated with hydrochlorothiazide  12.5 mg daily, losartan  50 mg daily.   No new side effects.  BP Readings from Last 3 Encounters:  08/15/24 124/78  07/10/24 128/80  06/06/24 134/83   Lab Results  Component Value Date   CREATININE 0.95 07/07/2024   Hyperlipidemia treated with Lipitor 10 mg daily.  No new myalgias  Lab Results  Component Value Date   CHOL 143 08/18/2021   HDL 56.20 08/18/2021   LDLCALC 72 08/18/2021   TRIG 71.0 08/18/2021   CHOLHDL 3 08/18/2021   Lab Results  Component Value Date   ALT 19 07/07/2024   AST 28 07/07/2024   ALKPHOS 126 07/07/2024   BILITOT 0.9 07/07/2024    He is also seeing Dr. Zack Smith for chronic low back pain.  Office visit in August with option to consider  possibility of epidurals but plan to wait until the beginning of next season.  Right shoulder pain with impingement, improving at his August visit.    Ophthalmology, [facility]. Mechanical fall last year with multiple injuries, no recurrent falls. Still playing tennis. Occasional alleve or advil.  Recreational tennis currently.   Dermatology, Dr. Jadine, appointment in July.  History of basal cell skin cancer.  I do see that he had an ultrasound in April, possible skin cyst, updated note on April 17, area had ruptured on own.  Option of follow-up if recurrent.   HM: Flu vaccine today.  Covid booster - recommended if he chooses as well as RSV vaccine if he chooses.    Immunization History  Administered Date(s) Administered   Fluad Quad(high Dose 65+) 08/30/2021   INFLUENZA, HIGH DOSE SEASONAL PF 08/18/2019, 08/15/2024   PFIZER(Purple Top)SARS-COV-2 Vaccination 12/19/2019, 01/21/2020, 10/24/2020, 02/28/2021   Pneumococcal Conjugate-13 01/23/2017   Pneumococcal Polysaccharide-23 05/22/2018   Tdap 02/17/2021, 01/29/2023      History Patient Active Problem List   Diagnosis Date Noted   Basal cell carcinoma (BCC) of skin of left upper extremity including shoulder 08/15/2024   Right shoulder pain 01/11/2024   Microscopic hematuria 04/11/2023   BPH with obstruction/lower urinary tract symptoms 03/09/2023   Organic impotence 03/09/2023   Traumatic pneumothorax, initial encounter 01/29/2023  Pain of right lower extremity due to ischemia 05/01/2022   Leg DVT (deep venous thromboembolism), chronic, left (HCC) 12/12/2021   Mixed hyperlipidemia 11/21/2021   Low libido 11/21/2021   Low back pain 11/21/2021   Loss of appetite 11/21/2021   Hypertensive retinopathy 11/21/2021   Essential hypertension 11/21/2021   Chronic pain 11/21/2021   Enlarged prostate 06/20/2021   Degenerative disc disease, cervical 12/03/2020   Degenerative lumbar disc 09/27/2020   Degenerative arthritis  of left knee 09/27/2020   Past Medical History:  Diagnosis Date   Allergy penicillin   DDD (degenerative disc disease), lumbar    DVT (deep venous thrombosis) (HCC)    GERD (gastroesophageal reflux disease)    Hyperlipidemia    Hypertension    Leg DVT (deep venous thromboembolism), chronic, left (HCC) 12/12/2021   Raynaud disease    Past Surgical History:  Procedure Laterality Date   EYE SURGERY     Allergies  Allergen Reactions   Penicillins Hives and Rash        Penicillin V Potassium Other (See Comments)   Prior to Admission medications   Medication Sig Start Date End Date Taking? Authorizing Provider  acetaminophen  (TYLENOL ) 500 MG tablet Take 2 tablets (1,000 mg total) by mouth every 8 (eight) hours as needed. 02/01/23  Yes Maczis, Michael M, PA-C  atorvastatin  (LIPITOR) 10 MG tablet TAKE ONE TABLET BY MOUTH ONE TIME DAILY 07/15/24  Yes Jason Leita Repine, FNP  gabapentin  (NEURONTIN ) 100 MG capsule Take 2 capsules (200 mg total) by mouth at bedtime. 10/23/23  Yes Claudene Hussar M, DO  hydrochlorothiazide  (MICROZIDE ) 12.5 MG capsule TAKE ONE CAPSULE BY MOUTH ONE TIME DAILY 07/15/24  Yes Jason Leita Repine, FNP  losartan  (COZAAR ) 50 MG tablet TAKE ONE TABLET BY MOUTH ONE TIME DAILY 01/28/24  Yes Jason Leita Repine, FNP  Multiple Vitamin (MULTIVITAMIN WITH MINERALS) TABS tablet Take 1 tablet by mouth daily. 02/02/23  Yes Maczis, Michael M, PA-C  warfarin (COUMADIN ) 5 MG tablet Take 1-2 tablets (5-10 mg total) by mouth daily. Take 5mg  by mouth for two days. On 3rd day, take 10mg . Then repeat. Patient taking differently: Take 5 mg by mouth daily. Take 5mg  by mouth for two days. On 3rd day, take 10mg . Then repeat. 10/16/23  Yes Timmy Maude SAUNDERS, MD  thiamine  (VITAMIN B-1) 100 MG tablet Take 1 tablet (100 mg total) by mouth daily. Patient not taking: Reported on 08/15/2024 02/02/23   Maczis, Michael M, PA-C   Social History   Socioeconomic History   Marital status: Widowed     Spouse name: Not on file   Number of children: Not on file   Years of education: Not on file   Highest education level: Master's degree (e.g., MA, MS, MEng, MEd, MSW, MBA)  Occupational History   Not on file  Tobacco Use   Smoking status: Never   Smokeless tobacco: Never  Vaping Use   Vaping status: Never Used  Substance and Sexual Activity   Alcohol use: Yes    Alcohol/week: 10.0 standard drinks of alcohol    Types: 10 Glasses of wine per week   Drug use: Not Currently   Sexual activity: Not Currently  Other Topics Concern   Not on file  Social History Narrative   Not on file   Social Drivers of Health   Financial Resource Strain: Low Risk  (08/03/2024)   Overall Financial Resource Strain (CARDIA)    Difficulty of Paying Living Expenses: Not hard at all  Food Insecurity:  No Food Insecurity (08/03/2024)   Hunger Vital Sign    Worried About Running Out of Food in the Last Year: Never true    Ran Out of Food in the Last Year: Never true  Transportation Needs: No Transportation Needs (08/03/2024)   PRAPARE - Administrator, Civil Service (Medical): No    Lack of Transportation (Non-Medical): No  Physical Activity: Sufficiently Active (08/03/2024)   Exercise Vital Sign    Days of Exercise per Week: 3 days    Minutes of Exercise per Session: 90 min  Stress: Stress Concern Present (08/03/2024)   [employer] of Occupational Health - Occupational Stress Questionnaire    Feeling of Stress: To some extent  Social Connections: Moderately Isolated (08/03/2024)   Social Connection and Isolation Panel    Frequency of Communication with Friends and Family: More than three times a week    Frequency of Social Gatherings with Friends and Family: Three times a week    Attends Religious Services: Never    Active Member of Clubs or Organizations: Yes    Attends Banker Meetings: More than 4 times per year    Marital Status: Widowed  Intimate Partner Violence: Not  At Risk (08/07/2024)   Humiliation, Afraid, Rape, and Kick questionnaire    Fear of Current or Ex-Partner: No    Emotionally Abused: No    Physically Abused: No    Sexually Abused: No    Review of Systems  Constitutional:  Negative for fatigue and unexpected weight change.  Eyes:  Negative for visual disturbance.  Respiratory:  Negative for cough, chest tightness and shortness of breath.   Cardiovascular:  Negative for chest pain, palpitations and leg swelling.  Gastrointestinal:  Negative for abdominal pain and blood in stool.  Neurological:  Negative for dizziness, light-headedness and headaches.     Objective:   Vitals:   08/15/24 0800  BP: 124/78  Pulse: 67  Temp: 97.8 F (36.6 C)  TempSrc: Temporal  SpO2: 96%  Weight: 188 lb (85.3 kg)  Height: 5' 11 (1.803 m)     Physical Exam Vitals reviewed.  Constitutional:      Appearance: He is well-developed.  HENT:     Head: Normocephalic and atraumatic.  Neck:     Vascular: No carotid bruit or JVD.  Cardiovascular:     Rate and Rhythm: Normal rate and regular rhythm.     Heart sounds: Normal heart sounds. No murmur heard. Pulmonary:     Effort: Pulmonary effort is normal.     Breath sounds: Normal breath sounds. No rales.  Musculoskeletal:     Right lower leg: No edema.     Left lower leg: No edema.  Skin:    General: Skin is warm and dry.  Neurological:     Mental Status: He is alert and oriented to person, place, and time.  Psychiatric:        Mood and Affect: Mood normal.     Assessment & Plan:  Gervis Gaba Sr. is a 73 y.o. male . Primary hypertension - Plan: hydrochlorothiazide  (MICROZIDE ) 12.5 MG capsule, losartan  (COZAAR ) 50 MG tablet  -  Stable, tolerating current regimen. Medications refilled. Labs noted recently.  Recheck 6 months.    Need for influenza vaccination - Plan: Flu vaccine HIGH DOSE PF(Fluzone Trivalent)  Hyperlipidemia, unspecified hyperlipidemia type - Plan: Lipid panel,  atorvastatin  (LIPITOR) 10 MG tablet  - Stable, tolerating current regimen. Medications refilled. Labs pending as above.   Leg  DVT (deep venous thromboembolism), chronic, left (HCC)  - Stable with recent INR at 3.0, monitored by hematology on Coumadin , denies new side effects or bleeding.  Continue follow-up with hematology.  He also plans to discuss colonoscopy with hematology and timing of Coumadin  hold if needed.  I can place a referral if and when needed.  Need for hepatitis C screening test - Plan: Hepatitis C antibody  Briefly discussed abdominal discomfort, noted with certain foods, asymptomatic at present.  No red flags at this time.  Plans over-the-counter treatment first with antacid and RTC precautions if persistent or worsening. Meds ordered this encounter  Medications   atorvastatin  (LIPITOR) 10 MG tablet    Sig: Take 1 tablet (10 mg total) by mouth daily.    Dispense:  90 tablet    Refill:  1   hydrochlorothiazide  (MICROZIDE ) 12.5 MG capsule    Sig: Take 1 capsule (12.5 mg total) by mouth daily.    Dispense:  90 capsule    Refill:  1   losartan  (COZAAR ) 50 MG tablet    Sig: Take 1 tablet (50 mg total) by mouth daily.    Dispense:  90 tablet    Refill:  1   Patient Instructions  Thank you for coming in today. No change in medications at this time. If there are any concerns on your bloodwork, I will let you know. Take care!     Signed,   Reyes Pines, MD [facility], [facility] 08/15/24 9:25 AM

## 2024-08-16 LAB — HEPATITIS C ANTIBODY: Hepatitis C Ab: NONREACTIVE

## 2024-08-17 ENCOUNTER — Ambulatory Visit: Payer: Self-pay | Admitting: Family Medicine

## 2024-09-05 ENCOUNTER — Inpatient Hospital Stay

## 2024-09-09 ENCOUNTER — Ambulatory Visit: Payer: Self-pay

## 2024-09-09 ENCOUNTER — Inpatient Hospital Stay: Attending: Hematology & Oncology

## 2024-09-09 DIAGNOSIS — Z86718 Personal history of other venous thrombosis and embolism: Secondary | ICD-10-CM | POA: Diagnosis not present

## 2024-09-09 DIAGNOSIS — Z7901 Long term (current) use of anticoagulants: Secondary | ICD-10-CM | POA: Diagnosis not present

## 2024-09-09 DIAGNOSIS — I82502 Chronic embolism and thrombosis of unspecified deep veins of left lower extremity: Secondary | ICD-10-CM

## 2024-09-09 DIAGNOSIS — I82402 Acute embolism and thrombosis of unspecified deep veins of left lower extremity: Secondary | ICD-10-CM

## 2024-09-09 LAB — CBC WITH DIFFERENTIAL (CANCER CENTER ONLY)
Abs Immature Granulocytes: 0.03 K/uL (ref 0.00–0.07)
Basophils Absolute: 0 K/uL (ref 0.0–0.1)
Basophils Relative: 1 %
Eosinophils Absolute: 0.6 K/uL — ABNORMAL HIGH (ref 0.0–0.5)
Eosinophils Relative: 7 %
HCT: 48.8 % (ref 39.0–52.0)
Hemoglobin: 16.3 g/dL (ref 13.0–17.0)
Immature Granulocytes: 0 %
Lymphocytes Relative: 35 %
Lymphs Abs: 2.9 K/uL (ref 0.7–4.0)
MCH: 32.6 pg (ref 26.0–34.0)
MCHC: 33.4 g/dL (ref 30.0–36.0)
MCV: 97.6 fL (ref 80.0–100.0)
Monocytes Absolute: 0.6 K/uL (ref 0.1–1.0)
Monocytes Relative: 7 %
Neutro Abs: 4.1 K/uL (ref 1.7–7.7)
Neutrophils Relative %: 50 %
Platelet Count: 215 K/uL (ref 150–400)
RBC: 5 MIL/uL (ref 4.22–5.81)
RDW: 12.9 % (ref 11.5–15.5)
WBC Count: 8.3 K/uL (ref 4.0–10.5)
nRBC: 0 % (ref 0.0–0.2)

## 2024-09-09 LAB — PROTIME-INR
INR: 2.4 — ABNORMAL HIGH (ref 0.8–1.2)
Prothrombin Time: 27.3 s — ABNORMAL HIGH (ref 11.4–15.2)

## 2024-09-25 DIAGNOSIS — L2989 Other pruritus: Secondary | ICD-10-CM | POA: Diagnosis not present

## 2024-09-25 DIAGNOSIS — Z85828 Personal history of other malignant neoplasm of skin: Secondary | ICD-10-CM | POA: Diagnosis not present

## 2024-09-25 DIAGNOSIS — L821 Other seborrheic keratosis: Secondary | ICD-10-CM | POA: Diagnosis not present

## 2024-09-25 DIAGNOSIS — D1801 Hemangioma of skin and subcutaneous tissue: Secondary | ICD-10-CM | POA: Diagnosis not present

## 2024-09-25 DIAGNOSIS — L814 Other melanin hyperpigmentation: Secondary | ICD-10-CM | POA: Diagnosis not present

## 2024-09-25 DIAGNOSIS — L570 Actinic keratosis: Secondary | ICD-10-CM | POA: Diagnosis not present

## 2024-09-25 DIAGNOSIS — L720 Epidermal cyst: Secondary | ICD-10-CM | POA: Diagnosis not present

## 2024-10-07 ENCOUNTER — Inpatient Hospital Stay: Attending: Hematology & Oncology

## 2024-10-07 ENCOUNTER — Inpatient Hospital Stay: Admitting: Family

## 2024-10-07 ENCOUNTER — Other Ambulatory Visit: Payer: Self-pay | Admitting: Family

## 2024-10-07 VITALS — BP 123/75 | HR 75 | Temp 98.0°F | Resp 17 | Wt 189.0 lb

## 2024-10-07 DIAGNOSIS — I82402 Acute embolism and thrombosis of unspecified deep veins of left lower extremity: Secondary | ICD-10-CM

## 2024-10-07 DIAGNOSIS — I82502 Chronic embolism and thrombosis of unspecified deep veins of left lower extremity: Secondary | ICD-10-CM

## 2024-10-07 DIAGNOSIS — Z7901 Long term (current) use of anticoagulants: Secondary | ICD-10-CM

## 2024-10-07 DIAGNOSIS — Z86718 Personal history of other venous thrombosis and embolism: Secondary | ICD-10-CM

## 2024-10-07 LAB — CBC WITH DIFFERENTIAL (CANCER CENTER ONLY)
Abs Immature Granulocytes: 0.02 K/uL (ref 0.00–0.07)
Basophils Absolute: 0.1 K/uL (ref 0.0–0.1)
Basophils Relative: 1 %
Eosinophils Absolute: 0.8 K/uL — ABNORMAL HIGH (ref 0.0–0.5)
Eosinophils Relative: 11 %
HCT: 49.1 % (ref 39.0–52.0)
Hemoglobin: 16.3 g/dL (ref 13.0–17.0)
Immature Granulocytes: 0 %
Lymphocytes Relative: 32 %
Lymphs Abs: 2.4 K/uL (ref 0.7–4.0)
MCH: 32.3 pg (ref 26.0–34.0)
MCHC: 33.2 g/dL (ref 30.0–36.0)
MCV: 97.4 fL (ref 80.0–100.0)
Monocytes Absolute: 0.5 K/uL (ref 0.1–1.0)
Monocytes Relative: 7 %
Neutro Abs: 3.7 K/uL (ref 1.7–7.7)
Neutrophils Relative %: 49 %
Platelet Count: 228 K/uL (ref 150–400)
RBC: 5.04 MIL/uL (ref 4.22–5.81)
RDW: 13.2 % (ref 11.5–15.5)
WBC Count: 7.5 K/uL (ref 4.0–10.5)
nRBC: 0 % (ref 0.0–0.2)

## 2024-10-07 LAB — CMP (CANCER CENTER ONLY)
ALT: 17 U/L (ref 0–44)
AST: 27 U/L (ref 15–41)
Albumin: 4.3 g/dL (ref 3.5–5.0)
Alkaline Phosphatase: 126 U/L (ref 38–126)
Anion gap: 10 (ref 5–15)
BUN: 12 mg/dL (ref 8–23)
CO2: 28 mmol/L (ref 22–32)
Calcium: 9.5 mg/dL (ref 8.9–10.3)
Chloride: 103 mmol/L (ref 98–111)
Creatinine: 0.96 mg/dL (ref 0.61–1.24)
GFR, Estimated: >60 mL/min (ref >60)
Glucose, Bld: 88 mg/dL (ref 70–99)
Potassium: 4.4 mmol/L (ref 3.5–5.1)
Sodium: 141 mmol/L (ref 135–145)
Total Bilirubin: 1 mg/dL (ref 0.0–1.2)
Total Protein: 7.3 g/dL (ref 6.5–8.1)

## 2024-10-07 LAB — LACTATE DEHYDROGENASE: LDH: 203 U/L (ref 105–235)

## 2024-10-07 LAB — PROTIME-INR
INR: 2.4 — ABNORMAL HIGH (ref 0.8–1.2)
Prothrombin Time: 27.2 s — ABNORMAL HIGH (ref 11.4–15.2)

## 2024-10-07 NOTE — Progress Notes (Signed)
 Hematology and Oncology Follow Up Visit  Raymond Snee Sr. [id number] July 20, 1951 73 y.o. 10/07/2024   Principle Diagnosis:  Thrombus of left femoral vein (chronic) down to left popliteal vein -- idiopathic   Current Therapy:        Pradaxa  150 mg po BID -- started on 05/16/2022 d/c'd Coumadin  adjusted to maintain INR of 2.5-3.5     Interim History:  Raymond [name] is here today for follow-up. He is doing well over all. He is having issues from chronic back pain and states that sports medicine has discussed doing steroid vs gel injections to help. He follows up with them soon.  He has still been able to play tennis regularly. They have moved indoors due to the colder temps outside.  No fells or syncope reported.  No swelling, numbness or tingling in the extremities.  INR remains therapeutic at 2.4. He is currently taking Coumadin  5 mg PO daily.  No abnormal blood loss or bruising, no petechiae.  No fever, chills, n/v, cough, rash, dizziness, SOB, chest pain, palpitations or changes in bowel or bladder habits.  He has had some abdominal bloating and GERD symptoms recently and is paying attention to what he eats in order to identify a trigger. Hydration is good and weight is stable at 189 lbs.   ECOG Performance Status: 1 - Symptomatic but completely ambulatory  Medications:  Allergies as of 10/07/2024       Reactions   Penicillins Hives, Rash      Penicillin V Potassium Other (See Comments)        Medication List        Accurate as of October 07, 2024 10:09 AM. If you have any questions, ask your nurse or doctor.          acetaminophen  500 MG tablet Commonly known as: TYLENOL  Take 2 tablets (1,000 mg total) by mouth every 8 (eight) hours as needed.   atorvastatin  10 MG tablet Commonly known as: LIPITOR Take 1 tablet (10 mg total) by mouth daily.   gabapentin  100 MG capsule Commonly known as: NEURONTIN  Take 2 capsules (200 mg total) by mouth at bedtime.    hydrochlorothiazide  12.5 MG capsule Commonly known as: MICROZIDE  Take 1 capsule (12.5 mg total) by mouth daily.   losartan  50 MG tablet Commonly known as: COZAAR  Take 1 tablet (50 mg total) by mouth daily.   multivitamin with minerals Tabs tablet Take 1 tablet by mouth daily.   warfarin 5 MG tablet Commonly known as: COUMADIN  Take 1-2 tablets (5-10 mg total) by mouth daily. Take 5mg  by mouth for two days. On 3rd day, take 10mg . Then repeat. What changed: how much to take        Allergies:  Allergies  Allergen Reactions   Penicillins Hives and Rash        Penicillin V Potassium Other (See Comments)    Past Medical History, Surgical history, Social history, and Family History were reviewed and updated.  Review of Systems: All other 10 point review of systems is negative.   Physical Exam:  weight is 189 lb (85.7 kg). His oral temperature is 98 F (36.7 C). His blood pressure is 123/75 and his pulse is 75. His respiration is 17 and oxygen saturation is 98%.   Wt Readings from Last 3 Encounters:  10/07/24 189 lb (85.7 kg)  08/15/24 188 lb (85.3 kg)  08/07/24 185 lb (83.9 kg)    Ocular: Sclerae unicteric, pupils equal, round and reactive to light Ear-nose-throat: Oropharynx  clear, dentition fair Lymphatic: No cervical or supraclavicular adenopathy Lungs no rales or rhonchi, good excursion bilaterally Heart regular rate and rhythm, no murmur appreciated Abd soft, nontender, positive bowel sounds MSK no focal spinal tenderness, no joint edema Neuro: non-focal, well-oriented, appropriate affect Breasts: Deferred   Lab Results  Component Value Date   WBC 7.5 10/07/2024   HGB 16.3 10/07/2024   HCT 49.1 10/07/2024   MCV 97.4 10/07/2024   PLT 228 10/07/2024   No results found for: FERRITIN, IRON, TIBC, UIBC, IRONPCTSAT Lab Results  Component Value Date   RBC 5.04 10/07/2024   No results found for: KPAFRELGTCHN, LAMBDASER, KAPLAMBRATIO No  results found for: IGGSERUM, IGA, IGMSERUM No results found for: STEPHANY CARLOTA BENSON MARKEL EARLA JOANNIE DOC VICK, SPEI   Chemistry      Component Value Date/Time   NA 141 07/07/2024 0946   K 4.4 07/07/2024 0946   CL 104 07/07/2024 0946   CO2 26 07/07/2024 0946   BUN 14 07/07/2024 0946   CREATININE 0.95 07/07/2024 0946      Component Value Date/Time   CALCIUM  9.3 07/07/2024 0946   ALKPHOS 126 07/07/2024 0946   AST 28 07/07/2024 0946   ALT 19 07/07/2024 0946   BILITOT 0.9 07/07/2024 0946       Impression and Plan: Raymond [name] is a very pleasant 73 yo gentleman with left lower extremity DVT felt to be idiopathic.   INR 2.4. No changes to Coumadin  regimen at this time.  Monthly INR check and follow-up in 4 months.   Lauraine Pepper, NP 11/25/202510:09 AM

## 2024-11-04 ENCOUNTER — Other Ambulatory Visit: Payer: Self-pay | Admitting: Hematology & Oncology

## 2024-11-04 ENCOUNTER — Encounter: Payer: Self-pay | Admitting: Family

## 2024-11-04 ENCOUNTER — Inpatient Hospital Stay: Attending: Hematology & Oncology

## 2024-11-04 DIAGNOSIS — I82502 Chronic embolism and thrombosis of unspecified deep veins of left lower extremity: Secondary | ICD-10-CM

## 2024-11-04 DIAGNOSIS — Z86718 Personal history of other venous thrombosis and embolism: Secondary | ICD-10-CM | POA: Diagnosis present

## 2024-11-04 DIAGNOSIS — Z7901 Long term (current) use of anticoagulants: Secondary | ICD-10-CM | POA: Diagnosis not present

## 2024-11-04 DIAGNOSIS — I82402 Acute embolism and thrombosis of unspecified deep veins of left lower extremity: Secondary | ICD-10-CM

## 2024-11-04 LAB — PROTIME-INR
INR: 2.4 — ABNORMAL HIGH (ref 0.8–1.2)
Prothrombin Time: 27 s — ABNORMAL HIGH (ref 11.4–15.2)

## 2024-11-07 ENCOUNTER — Other Ambulatory Visit: Payer: Self-pay | Admitting: Hematology & Oncology

## 2024-11-07 DIAGNOSIS — I82502 Chronic embolism and thrombosis of unspecified deep veins of left lower extremity: Secondary | ICD-10-CM

## 2024-12-02 ENCOUNTER — Inpatient Hospital Stay: Attending: Hematology & Oncology

## 2024-12-02 ENCOUNTER — Encounter: Payer: Self-pay | Admitting: Family

## 2024-12-02 DIAGNOSIS — I82502 Chronic embolism and thrombosis of unspecified deep veins of left lower extremity: Secondary | ICD-10-CM

## 2024-12-02 DIAGNOSIS — I82402 Acute embolism and thrombosis of unspecified deep veins of left lower extremity: Secondary | ICD-10-CM

## 2024-12-02 DIAGNOSIS — Z7901 Long term (current) use of anticoagulants: Secondary | ICD-10-CM

## 2024-12-02 LAB — PROTIME-INR
INR: 2.5 — ABNORMAL HIGH (ref 0.8–1.2)
Prothrombin Time: 28 s — ABNORMAL HIGH (ref 11.4–15.2)

## 2024-12-09 NOTE — Progress Notes (Unsigned)
 " [facility] [address] [place] [address] Phone: [phone] Subjective:   Raymond [name], am serving as a scribe for Dr. Arthea Claudene.  I'm seeing this patient by the request  of:  Levora Reyes SAUNDERS, MD  CC: low back pain follow up   YEP:Dlagzrupcz  07/10/2024 History of DVT and is on Coumadin  which makes anti-inflammatories impossible but he is taking Tylenol  for breakthrough pain     Low back seems to be stable at this moment.  Still has worsening pain when doing a lot of repetitive activity especially with playing tennis.  States that in the third manage has some difficulty.  We discussed diet, we discussed hydration, discussed other medications which patient still wants to avoid.  Will consider the possibility of epidurals but would like to wait until the beginning of next season.  Increase activity slowly otherwise.  Follow-up again 6 to 8 weeks.     Updated 12/10/2024 Raymond Mathew Drones Sr. is a 74 y.o. male coming in with complaint of LBP. Has some new things to talk about. Back and knee pain remain the same and able to manage. In the last couple of weeks having some pain in the arch of foot on the L side. Hurts first 15 mins of waking up. Has been having chronic neck pain, has gotten worse, referral to chiropractor.      Past Medical History:  Diagnosis Date   Allergy penicillin   DDD (degenerative disc disease), lumbar    DVT (deep venous thrombosis) (HCC)    GERD (gastroesophageal reflux disease)    Hyperlipidemia    Hypertension    Leg DVT (deep venous thromboembolism), chronic, left (HCC) 12/12/2021   Raynaud disease    Past Surgical History:  Procedure Laterality Date   EYE SURGERY     Social History   Socioeconomic History   Marital status: Widowed    Spouse name: Not on file   Number of children: Not on file   Years of education: Not on file   Highest education level: Master's degree (e.g., MA, MS, MEng, MEd, MSW,  MBA)  Occupational History   Not on file  Tobacco Use   Smoking status: Never   Smokeless tobacco: Never  Vaping Use   Vaping status: Never Used  Substance and Sexual Activity   Alcohol use: Yes    Alcohol/week: 10.0 standard drinks of alcohol    Types: 10 Glasses of wine per week   Drug use: Not Currently   Sexual activity: Not Currently  Other Topics Concern   Not on file  Social History Narrative   Not on file   Social Drivers of Health   Tobacco Use: Low Risk (08/15/2024)   Patient History    Smoking Tobacco Use: Never    Smokeless Tobacco Use: Never    Passive Exposure: Not on file  Financial Resource Strain: Low Risk (08/03/2024)   Overall Financial Resource Strain (CARDIA)    Difficulty of Paying Living Expenses: Not hard at all  Food Insecurity: No Food Insecurity (08/03/2024)   Epic    Worried About Radiation Protection Practitioner of Food in the Last Year: Never true    Ran Out of Food in the Last Year: Never true  Transportation Needs: No Transportation Needs (08/03/2024)   Epic    Lack of Transportation (Medical): No    Lack of Transportation (Non-Medical): No  Physical Activity: Sufficiently Active (08/03/2024)   Exercise Vital Sign    Days  of Exercise per Week: 3 days    Minutes of Exercise per Session: 90 min  Stress: Stress Concern Present (08/03/2024)   [employer] of Occupational Health - Occupational Stress Questionnaire    Feeling of Stress: To some extent  Social Connections: Moderately Isolated (08/03/2024)   Social Connection and Isolation Panel    Frequency of Communication with Friends and Family: More than three times a week    Frequency of Social Gatherings with Friends and Family: Three times a week    Attends Religious Services: Never    Active Member of Clubs or Organizations: Yes    Attends Banker Meetings: More than 4 times per year    Marital Status: Widowed  Depression (PHQ2-9): Low Risk (10/07/2024)   Depression (PHQ2-9)    PHQ-2 Score:  0  Alcohol Screen: Low Risk (08/03/2024)   Alcohol Screen    Last Alcohol Screening Score (AUDIT): 3  Housing: Low Risk (08/03/2024)   Epic    Unable to Pay for Housing in the Last Year: No    Number of Times Moved in the Last Year: 0    Homeless in the Last Year: No  Utilities: Not At Risk (08/07/2024)   Epic    Threatened with loss of utilities: No  Health Literacy: Adequate Health Literacy (08/07/2024)   B1300 Health Literacy    Frequency of need for help with medical instructions: Never   Allergies[1] Family History  Problem Relation Age of Onset   Alcohol abuse Mother    Arthritis Mother    Diabetes Mother    Mental retardation Mother    Alcohol abuse Father    Hypertension Father    COPD Father    Cancer Sister    Depression Sister    Cancer Sister    Arthritis Maternal Grandmother    Parkinson's disease Maternal Grandfather     Current Outpatient Medications (Cardiovascular):    atorvastatin  (LIPITOR) 10 MG tablet, Take 1 tablet (10 mg total) by mouth daily.   hydrochlorothiazide  (MICROZIDE ) 12.5 MG capsule, Take 1 capsule (12.5 mg total) by mouth daily.   losartan  (COZAAR ) 50 MG tablet, Take 1 tablet (50 mg total) by mouth daily.  Current Outpatient Medications (Analgesics):    acetaminophen  (TYLENOL ) 500 MG tablet, Take 2 tablets (1,000 mg total) by mouth every 8 (eight) hours as needed.  Current Outpatient Medications (Hematological):    warfarin (JANTOVEN ) 5 MG tablet, Take 1 tablet (5 mg total) by mouth daily at 4 PM.  Current Outpatient Medications (Other):    gabapentin  (NEURONTIN ) 100 MG capsule, Take 2 capsules (200 mg total) by mouth at bedtime.   Multiple Vitamin (MULTIVITAMIN WITH MINERALS) TABS tablet, Take 1 tablet by mouth daily.   Reviewed prior external information including notes and imaging from  primary care provider As well as notes that were available from care everywhere and other healthcare systems.  Past medical history, social,  surgical and family history all reviewed in electronic medical record.  No pertanent information unless stated regarding to the chief complaint.   Review of Systems:  No headache, visual changes, nausea, vomiting, diarrhea, constipation, dizziness, abdominal pain, skin rash, fevers, chills, night sweats, weight loss, swollen lymph nodes, body aches, joint swelling, chest pain, shortness of breath, mood changes. POSITIVE muscle aches  Objective  Blood pressure (!) 138/90, pulse 82, height 5' 11 (1.803 m), weight 190 lb (86.2 kg), SpO2 94%.   General: No apparent distress alert and oriented x3 mood and affect normal, dressed  appropriately.  HEENT: Pupils equal, extraocular movements intact  Respiratory: Patient's speak in full sentences and does not appear short of breath  Cardiovascular: No lower extremity edema, non tender, no erythema  Right shoulder shows patient does have tenderness to palpation in the paraspinal musculature.  Neck exam does have some limited sidebending bilaterally.  Limited range of motion noted. Left ankle tightness noted with dorsi flexion noted.  Limited muscular skeletal ultrasound was performed and interpreted by CLAUDENE HUSSAR, M  Limited ultrasound shows Achilles is unremarkable with very mild calcific changes at the insertion.  There is a hypoechoic change consistent with a retrocalcaneal bursitis.  No significant inflammation noted of the plantar fascia.  [id number]; 15 additional minutes spent for Therapeutic exercises as stated in above notes.  This included exercises focusing on stretching, strengthening, with significant focus on eccentric aspects.   Long term goals include an improvement in range of motion, strength, endurance as well as avoiding reinjury. Patient's frequency would include in 1-2 times a day, 3-5 times a week for a duration of 6-12 weeks. Exercises that included:  Basic scapular stabilization to include adduction and depression of scapula Scaption,  focusing on proper movement and good control Internal and External rotation utilizing a theraband, with elbow tucked at side entire time Rows with theraband   Proper technique shown and discussed handout in great detail with ATC.  All questions were discussed and answered.     Impression and Recommendations:  Retrocalcaneal bursitis (back of heel), left Discussed heel lifts, discussed compression with exercises, eccentric exercises given.  Recovery sandals in the house.  Follow-up again in 6 to 12 weeks.     The above documentation has been reviewed and is accurate and complete Hussar CHRISTELLA Claudene, DO       [1]  Allergies Allergen Reactions   Penicillins Hives and Rash        Penicillin V Potassium Other (See Comments)   "

## 2024-12-10 ENCOUNTER — Ambulatory Visit

## 2024-12-10 ENCOUNTER — Ambulatory Visit: Admitting: Family Medicine

## 2024-12-10 ENCOUNTER — Encounter: Payer: Self-pay | Admitting: Family Medicine

## 2024-12-10 ENCOUNTER — Other Ambulatory Visit: Payer: Self-pay

## 2024-12-10 VITALS — BP 138/90 | HR 82 | Ht 71.0 in | Wt 190.0 lb

## 2024-12-10 DIAGNOSIS — M542 Cervicalgia: Secondary | ICD-10-CM

## 2024-12-10 DIAGNOSIS — M7752 Other enthesopathy of left foot: Secondary | ICD-10-CM | POA: Diagnosis not present

## 2024-12-10 DIAGNOSIS — M5030 Other cervical disc degeneration, unspecified cervical region: Secondary | ICD-10-CM

## 2024-12-10 DIAGNOSIS — M25572 Pain in left ankle and joints of left foot: Secondary | ICD-10-CM | POA: Diagnosis not present

## 2024-12-10 NOTE — Assessment & Plan Note (Signed)
 Known moderate to severe degenerative disc disease.  I do not believe that manipulation would be beneficial for this individual.  We discussed different medications which included gabapentin  which patient still has the prescription.  We discussed icing regimen and home exercises, discussed which activities to do and which ones to avoid.  Discussed icing regimen.  Follow-up again in 6 to 8 weeks.

## 2024-12-10 NOTE — Patient Instructions (Signed)
 See me in 2 months

## 2024-12-10 NOTE — Assessment & Plan Note (Signed)
 Discussed heel lifts, discussed compression with exercises, eccentric exercises given.  Recovery sandals in the house.  Follow-up again in 6 to 12 weeks.

## 2024-12-15 ENCOUNTER — Ambulatory Visit: Payer: Self-pay | Admitting: Family Medicine

## 2024-12-30 ENCOUNTER — Inpatient Hospital Stay

## 2025-01-27 ENCOUNTER — Inpatient Hospital Stay: Admitting: Family

## 2025-01-27 ENCOUNTER — Inpatient Hospital Stay

## 2025-02-06 ENCOUNTER — Ambulatory Visit: Admitting: Family Medicine

## 2025-02-12 ENCOUNTER — Ambulatory Visit: Admitting: Family Medicine

## 2025-08-25 ENCOUNTER — Ambulatory Visit
